# Patient Record
Sex: Female | Born: 1965 | Race: White | Hispanic: No | Smoking: Current every day smoker
Health system: Southern US, Community
[De-identification: ages and names within clinical notes are randomized; demographics above are authoritative.]

## PROBLEM LIST (undated history)

## (undated) ENCOUNTER — Emergency Department (HOSPITAL_COMMUNITY): Admission: EM | Payer: Commercial Managed Care - PPO | Source: Home / Self Care

## (undated) DIAGNOSIS — R112 Nausea with vomiting, unspecified: Secondary | ICD-10-CM

## (undated) DIAGNOSIS — K219 Gastro-esophageal reflux disease without esophagitis: Secondary | ICD-10-CM

## (undated) DIAGNOSIS — J45909 Unspecified asthma, uncomplicated: Secondary | ICD-10-CM

## (undated) DIAGNOSIS — F411 Generalized anxiety disorder: Secondary | ICD-10-CM

## (undated) DIAGNOSIS — Z9889 Other specified postprocedural states: Secondary | ICD-10-CM

## (undated) DIAGNOSIS — J189 Pneumonia, unspecified organism: Secondary | ICD-10-CM

## (undated) DIAGNOSIS — R3129 Other microscopic hematuria: Secondary | ICD-10-CM

## (undated) DIAGNOSIS — F419 Anxiety disorder, unspecified: Secondary | ICD-10-CM

## (undated) DIAGNOSIS — I82409 Acute embolism and thrombosis of unspecified deep veins of unspecified lower extremity: Secondary | ICD-10-CM

## (undated) HISTORY — DX: Unspecified asthma, uncomplicated: J45.909

## (undated) HISTORY — DX: Acute embolism and thrombosis of unspecified deep veins of unspecified lower extremity: I82.409

## (undated) HISTORY — DX: Pneumonia, unspecified organism: J18.9

## (undated) HISTORY — DX: Generalized anxiety disorder: F41.1

## (undated) HISTORY — DX: Other microscopic hematuria: R31.29

---

## 1997-11-09 ENCOUNTER — Other Ambulatory Visit: Admission: RE | Admit: 1997-11-09 | Discharge: 1997-11-09 | Payer: Self-pay | Admitting: Obstetrics and Gynecology

## 1998-11-14 ENCOUNTER — Other Ambulatory Visit: Admission: RE | Admit: 1998-11-14 | Discharge: 1998-11-14 | Payer: Self-pay | Admitting: Obstetrics and Gynecology

## 1999-02-14 ENCOUNTER — Encounter: Payer: Self-pay | Admitting: Obstetrics and Gynecology

## 1999-02-14 ENCOUNTER — Ambulatory Visit (HOSPITAL_COMMUNITY): Admission: RE | Admit: 1999-02-14 | Discharge: 1999-02-14 | Payer: Self-pay | Admitting: Obstetrics and Gynecology

## 1999-06-18 ENCOUNTER — Other Ambulatory Visit: Admission: RE | Admit: 1999-06-18 | Discharge: 1999-06-18 | Payer: Self-pay | Admitting: Obstetrics and Gynecology

## 1999-06-18 ENCOUNTER — Encounter (INDEPENDENT_AMBULATORY_CARE_PROVIDER_SITE_OTHER): Payer: Self-pay

## 1999-08-09 ENCOUNTER — Other Ambulatory Visit: Admission: RE | Admit: 1999-08-09 | Discharge: 1999-08-09 | Payer: Self-pay | Admitting: Obstetrics and Gynecology

## 1999-08-09 ENCOUNTER — Encounter (INDEPENDENT_AMBULATORY_CARE_PROVIDER_SITE_OTHER): Payer: Self-pay

## 1999-10-02 ENCOUNTER — Ambulatory Visit (HOSPITAL_COMMUNITY): Admission: RE | Admit: 1999-10-02 | Discharge: 1999-10-02 | Payer: Self-pay | Admitting: Obstetrics and Gynecology

## 1999-11-28 ENCOUNTER — Other Ambulatory Visit: Admission: RE | Admit: 1999-11-28 | Discharge: 1999-11-28 | Payer: Self-pay | Admitting: Obstetrics and Gynecology

## 2001-01-23 ENCOUNTER — Other Ambulatory Visit: Admission: RE | Admit: 2001-01-23 | Discharge: 2001-01-23 | Payer: Self-pay | Admitting: Obstetrics and Gynecology

## 2002-03-29 ENCOUNTER — Other Ambulatory Visit: Admission: RE | Admit: 2002-03-29 | Discharge: 2002-03-29 | Payer: Self-pay | Admitting: Obstetrics and Gynecology

## 2003-01-21 ENCOUNTER — Ambulatory Visit (HOSPITAL_COMMUNITY): Admission: RE | Admit: 2003-01-21 | Discharge: 2003-01-21 | Payer: Self-pay | Admitting: Family Medicine

## 2003-03-28 ENCOUNTER — Other Ambulatory Visit: Admission: RE | Admit: 2003-03-28 | Discharge: 2003-03-28 | Payer: Self-pay | Admitting: Obstetrics and Gynecology

## 2003-07-14 ENCOUNTER — Ambulatory Visit (HOSPITAL_COMMUNITY): Admission: RE | Admit: 2003-07-14 | Discharge: 2003-07-14 | Payer: Self-pay | Admitting: Family Medicine

## 2003-08-15 ENCOUNTER — Other Ambulatory Visit: Admission: RE | Admit: 2003-08-15 | Discharge: 2003-08-15 | Payer: Self-pay | Admitting: Dermatology

## 2004-02-05 HISTORY — PX: HYSTEROSCOPY: SHX211

## 2004-04-26 ENCOUNTER — Other Ambulatory Visit: Admission: RE | Admit: 2004-04-26 | Discharge: 2004-04-26 | Payer: Self-pay | Admitting: Obstetrics and Gynecology

## 2004-05-21 ENCOUNTER — Encounter (INDEPENDENT_AMBULATORY_CARE_PROVIDER_SITE_OTHER): Payer: Self-pay | Admitting: Specialist

## 2004-05-21 ENCOUNTER — Ambulatory Visit (HOSPITAL_COMMUNITY): Admission: RE | Admit: 2004-05-21 | Discharge: 2004-05-21 | Payer: Self-pay | Admitting: Obstetrics and Gynecology

## 2005-05-15 ENCOUNTER — Other Ambulatory Visit: Admission: RE | Admit: 2005-05-15 | Discharge: 2005-05-15 | Payer: Self-pay | Admitting: Obstetrics and Gynecology

## 2006-05-20 ENCOUNTER — Other Ambulatory Visit: Admission: RE | Admit: 2006-05-20 | Discharge: 2006-05-20 | Payer: Self-pay | Admitting: Obstetrics and Gynecology

## 2006-06-09 ENCOUNTER — Encounter: Admission: RE | Admit: 2006-06-09 | Discharge: 2006-06-09 | Payer: Self-pay | Admitting: Obstetrics and Gynecology

## 2006-06-11 ENCOUNTER — Encounter: Admission: RE | Admit: 2006-06-11 | Discharge: 2006-06-11 | Payer: Self-pay | Admitting: Obstetrics and Gynecology

## 2006-07-16 ENCOUNTER — Ambulatory Visit (HOSPITAL_COMMUNITY): Admission: RE | Admit: 2006-07-16 | Discharge: 2006-07-16 | Payer: Self-pay | Admitting: Otolaryngology

## 2006-07-16 ENCOUNTER — Encounter (INDEPENDENT_AMBULATORY_CARE_PROVIDER_SITE_OTHER): Payer: Self-pay | Admitting: Obstetrics and Gynecology

## 2007-06-02 ENCOUNTER — Other Ambulatory Visit: Admission: RE | Admit: 2007-06-02 | Discharge: 2007-06-02 | Payer: Self-pay | Admitting: Gynecology

## 2007-06-17 ENCOUNTER — Encounter: Admission: RE | Admit: 2007-06-17 | Discharge: 2007-06-17 | Payer: Self-pay | Admitting: Gynecology

## 2007-10-19 ENCOUNTER — Ambulatory Visit (HOSPITAL_BASED_OUTPATIENT_CLINIC_OR_DEPARTMENT_OTHER): Admission: RE | Admit: 2007-10-19 | Discharge: 2007-10-19 | Payer: Self-pay | Admitting: Otolaryngology

## 2008-03-07 ENCOUNTER — Ambulatory Visit: Payer: Self-pay | Admitting: Gynecology

## 2008-04-22 ENCOUNTER — Ambulatory Visit: Payer: Self-pay | Admitting: Gynecology

## 2008-06-21 ENCOUNTER — Encounter: Admission: RE | Admit: 2008-06-21 | Discharge: 2008-06-21 | Payer: Self-pay | Admitting: Gynecology

## 2008-06-23 ENCOUNTER — Encounter: Admission: RE | Admit: 2008-06-23 | Discharge: 2008-06-23 | Payer: Self-pay | Admitting: Gynecology

## 2008-06-28 ENCOUNTER — Ambulatory Visit: Payer: Self-pay | Admitting: Gynecology

## 2008-06-28 ENCOUNTER — Other Ambulatory Visit: Admission: RE | Admit: 2008-06-28 | Discharge: 2008-06-28 | Payer: Self-pay | Admitting: Gynecology

## 2008-06-28 ENCOUNTER — Encounter: Payer: Self-pay | Admitting: Gynecology

## 2009-06-26 ENCOUNTER — Encounter: Admission: RE | Admit: 2009-06-26 | Discharge: 2009-06-26 | Payer: Self-pay | Admitting: Gynecology

## 2009-07-24 ENCOUNTER — Ambulatory Visit: Payer: Self-pay | Admitting: Gynecology

## 2009-07-24 ENCOUNTER — Other Ambulatory Visit: Admission: RE | Admit: 2009-07-24 | Discharge: 2009-07-24 | Payer: Self-pay | Admitting: Gynecology

## 2010-05-25 ENCOUNTER — Other Ambulatory Visit: Payer: Self-pay | Admitting: Gynecology

## 2010-05-25 DIAGNOSIS — Z1231 Encounter for screening mammogram for malignant neoplasm of breast: Secondary | ICD-10-CM

## 2010-06-19 NOTE — H&P (Signed)
NAMECarmon Robertson             ACCOUNT NO.:  [id number]   MEDICAL RECORD NO.:  [id number]          PATIENT TYPE:  AMB   LOCATION:                                [facility]  [facility]   PHYSICIAN:  James A. Ashley Royalty, M.D.DATE OF BIRTH:  1965-07-15   DATE OF ADMISSION:  07/16/2006  DATE OF DISCHARGE:                              HISTORY & PHYSICAL   This is a 45 year old gravida 1, para 1, who presented July 10, 2006,  complaining of abnormal uterine bleeding.  She states she had a period  in early April and did not have another period until May 24.  However,  her bleeding ensued on June 3 and June 4.  She has a history of a  sonohysterogram which was not well-tolerated by her in the office  setting.  Subsequent ultrasound revealed the uterus to be 10 x 6 x 4 cm  with numerous small fibroids, the largest of which was 1 cm in greatest  diameter.  There was also a 2.3 cm right adnexal cyst.  The left adnexa  was unremarkable.  The patient presents for diagnostic/operative  hysteroscopy and dilatation and curettage.   MEDICATIONS:  None.   PAST MEDICAL HISTORY:  Medical:  Negative.   Surgical:  Diagnostic/operative laparoscopy August 2001.   ALLERGIES:  None.   FAMILY HISTORY:  Noncontributory.   SOCIAL HISTORY:  The patient denies the use of tobacco or significant  alcohol.   REVIEW OF SYSTEMS:  Noncontributory.   PHYSICAL EXAMINATION:  GENERAL:  A well-developed, well-nourished,  pleasant white female in no acute distress.  VITAL SIGNS:  Afebrile with vital signs stable.  CHEST:  Lungs are clear.  HEART:  Regular rate and rhythm.  BREASTS:  Exam deferred.  ABDOMEN:  Soft and nontender.  PELVIC:  Please see most recent office evaluation.  External genitalia  within normal limits.  Vagina and cervix are without gross lesions.  Bimanual examination reveals the uterus to be approximately __________  x 6 x 5 cm.  No adnexal masses are palpable.   IMPRESSION:  1. Fibroid uterus.  2. Right adnexal cyst, probably physiologic.  3. Abnormal uterine bleeding, possibly secondary to #1.  4. Refusal for office sonohysterogram.   PLAN:  1. Diagnostic/operative hysteroscopy.  2. Dilatation and curettage.  The risks, benefits, complications and      alternatives were fully discussed with the patient.  She states she      understands and accepts.  Questions were invited and answered.      James A. Ashley Royalty, M.D.  Electronically Signed     JAM/MEDQ  D:  07/15/2006  T:  07/15/2006  Job:  [id number]

## 2010-06-19 NOTE — Op Note (Signed)
NAME:  Ruth Robertson, Ruth Robertson      ACCOUNT NO.:  [id number]   MEDICAL RECORD NO.:  [id number]          PATIENT TYPE:  AMB   LOCATION:  SDC                           FACILITY:  [facility]   PHYSICIAN:  James A. Ashley Royalty, M.D.DATE OF BIRTH:  Aug 31, 1965   DATE OF PROCEDURE:  07/16/2006  DATE OF DISCHARGE:                               OPERATIVE REPORT   PREOPERATIVE DIAGNOSIS:  1. Fibroid uterus.  2. Abnormal uterine bleeding.  3. Refusal of office sonohysterogram.   POSTOPERATIVE DIAGNOSIS:  1. Fibroid uterus.  2. Abnormal uterine bleeding.  3. Refusal of office sonohysterogram.  4. Pathology pending.   PROCEDURE:  1. Diagnostic/operative hysteroscopy.  2. Dilatation and curettage.   SURGEON:  Sylvester Harder, M.D.   ANESTHESIA:  General with 1% Xylocaine paracervical block (20 mL).   ESTIMATED BLOOD LOSS:  50 mL.   COMPLICATIONS:  None.   PACKS AND DRAINS:  None.   PROCEDURE:  The patient is taken to the operating room, placed in the  dorsal supine position.  After general anesthetic was administered she  was placed in the lithotomy position, prepped and draped in usual manner  for vaginal surgery.  Posterior weighted retractor was placed per  vagina.  The anterior lip of the cervix was grasped with single-tooth  tenaculum.  Uterus was gently sounded to approximately 10 cm and noted  to be anteverted.  The cervix was then serially dilated to a size 25-  [place] using [employer] features editor.  The resectoscope was then placed into the  uterine cavity using sorbitol as a distension medium.  The left and  right tubal ostia were identified.  The endometrial cavity was  inspected.  There were several projections into the cavity which were  either fibroids or polyps.  Appropriate photos were obtained.  The  endocervix was benign.  The polyp versus fibroids were removed from  their attachments using the cutting waveform at 70 watts power.  The  fragments were sent to pathology for histologic  studies.  Hemostasis was  obtained with the coagulation waveform at approximately 40 watts power.  The resectoscope was then removed.   Attention was then turned to the uterine curettage.  Medium-size curette  was introduced into the uterine cavity.  First a four quadrant technique  was employed.  Then a therapeutic technique was employed.  All  curettings were submitted separately to pathology for histologic  studies.   Hemostasis was noted at this point.  The patient was felt to have  benefited maximally from the surgical procedure.  The vaginal  instruments were removed.  Hemostasis noted and the procedure  terminated.   The patient tolerated the procedure extremely well and was returned to  the recovery room in good condition.      James A. Ashley Royalty, M.D.  Electronically Signed     JAM/MEDQ  D:  07/16/2006  T:  07/16/2006  Job:  [id number]

## 2010-06-19 NOTE — Op Note (Signed)
NAME:  Ruth Robertson, METTLER      ACCOUNT NO.:  [id number]   MEDICAL RECORD NO.:  [id number]          PATIENT TYPE:  AMB   LOCATION:  DSC                          FACILITY:  [facility]   PHYSICIAN:  Jefry H. 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   PROCEDURES:  1. Nasal septoplasty.  2. Submucous resection of right inferior turbinate.   SURGEON:  Jefry H. Pollyann Kennedy, MD   ANESTHESIA:  General endotracheal anesthesia was used.   COMPLICATIONS:  None.   BLOOD LOSS:  Minimal.   FINDINGS:  Severe leftward septal deviation involving the 90-degree band  in the caudal cartilage and a severe leftward deflection of the entire  bony septum.  The septum was lying outside of the maxillary crest for  the majority of the inferior attachment.  The left inferior turbinate  was very small, the right side was large with thickened bone and  elongated bone.   REFERRING PHYSICIAN:  Scott A. Luking, MD   HISTORY:  A 45 year old with a long history of nasal obstruction,  getting worse over the past several years.  Risks, benefits,  alternatives, and complications of the procedure were explained to the  patient who seemed to understand and agreed for surgery.   PROCEDURE:  The patient was taken to the operating room, placed in the  operating table in supine position.  Following induction of general  endotracheal anesthesia, the table was positioned and the patient was  prepped and draped in the standard fashion.  Afrin spray was used  preoperatively in nasal cavities.  Xylocaine with epinephrine was  infiltrated into the septum, the columella of the inferior turbinate,  and the floor of the nose on the left.  Afrin-soaked pledgets were then  placed bilaterally.  1. Nasal septoplasty.  A left hemitransfixion incision was created      with a 15 scalpel with extension down to the anterior floor of the      nasal vestibule on the left side.  A Cottle elevator was used to      elevate the mucoperichondrial flap posteriorly down the left side      and connecting into a floor tunnel to expose the entire leftward      deviation.  The bony septum was severely deflected to the left and      fragments were resected.  A mucosal flap was then developed down      the right side to remove the posterior bony septum.  The majority      of ethmoid plate and the vomerine bone were resected.  A chisel was      used to remove part of the vomerine bone, which was leaning to the      left side as well.  The quadrangular cartilage had a 90-degree bend      at the caudal extent that was bent to the right side.  This      deflected area was completely resected leaving the remaining      quadrangular cartilage, which still had sufficient dorsal support      and caudal strut support.  The remainder of the ethmoid plate was      resected superiorly.  The posterior aspect of the quadrangular      cartilage was resected.  The cartilaginous attachment to the      maxillary crest was dissected off using the Cottle elevator and by      freeing up, the remaining cartilage was able to fit it nicely into      the centrally-positioned maxillary crest.  These maneuvers greatly      improved the positioning of the septum and the airway particularly      on the left side.  The mucosal incision was reapproximated with      chromic suture.  The septal flaps were quilted with plain gut.  2. Submucous resection in right inferior turbinate.  The leading edge      of the right inferior turbinate was incised in a vertical fashion      with a 15 scalpel.  Cottle elevator was used to elevate mucosa off      bone in all directions and a large bony fragment was resected.  The       turbinate remnant was outfractured.  The nasal cavities and      nasopharynx were suctioned blood secretions and the nasal cavities      were packed with rolled up Telfa coated with bacitracin ointment.      The patient was then awakened, extubated, and transferred to      recovery in stable condition.      Jefry H. Pollyann Kennedy, MD  Electronically Signed     JHR/MEDQ  D:  10/19/2007  T:  10/20/2007  Job:  [id number]   cc:   Lorin Picket A. Gerda Diss, MD

## 2010-06-22 NOTE — Op Note (Signed)
NAME:  Ruth Robertson, Ruth Robertson      ACCOUNT NO.:  [id number]   MEDICAL RECORD NO.:  [id number]          PATIENT TYPE:  AMB   LOCATION:  SDC                           [facility]ILITY[facility]   PHYSICIAN:  James A. Ashley Royalty, M.D.DATE OF BIRTH:  1965/08/24   DATE OF PROCEDURE:  05/21/2004  DATE OF DISCHARGE:                                 OPERATIVE REPORT   PREOPERATIVE DIAGNOSIS:  Intermenstrual bleeding, polyp versus fibroid.   POSTOPERATIVE DIAGNOSIS:  Apparent intrauterine polyp, pathology pending.   PROCEDURES:  1.  Diagnostic/operative hysteroscopy.  2.  Dilatation and curettage.   SURGEON:  Rudy Jew. Ashley Royalty, M.D.   ANESTHESIA:  General.   ESTIMATED BLOOD LOSS:  Less than 50 mL.   COMPLICATIONS:  None.   PACKS AND DRAINS:  None.   PROCEDURE:  The patient was taken to the operating room and placed in the  dorsal supine position.  After a general anesthetic was administered, she  was placed in the lithotomy position and prepped and draped in the usual  manner for vaginal surgery.  A posterior weighted retractor was placed per  vagina.  The anterior lip of the cervix was grasped with a single-tooth  tenaculum.  The uterus was gently sounded to approximately 9 cm.  It was  noted to be quite anteverted.  Next the cervix was dilated to a size 27  [place] using [employer] dilators.  The resectoscope was then placed into the  uterine cavity using sorbitol as a distention medium.  Immediately upon  visualizing the uterine cavity, a polyp was encountered and appropriate  photos obtained.  The left and right tubal ostia were seen without  difficulty.  After appropriate documentation was obtained photographically,  the polyp was removed.  Electricity was not required to do so.  It was  submitted separately to pathology for histologic studies.  Thereafter the  remainder of the uterine cavity appeared clean.  The endocervical canal also  appeared clean.  Appropriate photos were obtained.   The  attention was then turned to the uterine curettage.  A medium-sized  curette was introduced into the uterine cavity.  First a four-quadrant  technique was used.  Then a therapeutic technique was used.  All curettings  were submitted separately to pathology for histologic studies.  At this  point the vaginal instruments were removed.  There  were two small cervical lacerations at the tenaculum site, each of which was  easily closed with 3-0 chromic interrupted suture.  Hemostasis was noted and  the procedure terminated.   The patient tolerated the procedure extremely well and was returned to the  recovery room in good condition.      JAM/MEDQ  D:  05/21/2004  T:  05/21/2004  Job:  [id number]

## 2010-06-22 NOTE — H&P (Signed)
NAME:  Ruth Robertson, Ruth Robertson      ACCOUNT NO.:  [id number]   MEDICAL RECORD NO.:  [id number]          PATIENT TYPE:  AMB   LOCATION:  SDC                           [facility]   PHYSICIAN:  James A. Ashley Royalty, M.D.DATE OF BIRTH:  01-Sep-1965   DATE OF ADMISSION:  DATE OF DISCHARGE:                                HISTORY & PHYSICAL   HISTORY OF PRESENT ILLNESS:  This is a 45 year old gravida 1 para 1 who  presented April 26, 2004 complaining of intermenstrual bleeding. She was  subsequently asked to undergo sonohysterogram but unfortunately decided she  would prefer to proceed with D&C hysteroscopy instead.   MEDICATIONS:  None.   PAST MEDICAL HISTORY:  Medical:  Negative. Surgical:  Diagnostic/operative  laparoscopy August 2001.   ALLERGIES:  None.   FAMILY HISTORY:  Positive for hypertension, breast cancer.   SOCIAL HISTORY:  The patient denies the use of significant alcohol. She is a  smoker.   REVIEW OF SYSTEMS:  Noncontributory.   PHYSICAL EXAMINATION:  GENERAL:  Well-developed, well-nourished, pleasant  white female in no acute distress.  VITAL SIGNS:  Afebrile, vital signs stable. Please see exam dated April 26, 2004.  SKIN:  Warm and dry without lesions.  LYMPH:  There is no supraclavicular, cervical, or inguinal adenopathy.  HEENT:  Normocephalic.  NECK:  Supple without thyromegaly.  CHEST:  Lungs are clear.  CARDIAC:  Regular rate and rhythm without murmurs, gallops, or rubs.  BREAST:  Deferred today.  ABDOMEN:  Soft, nontender, without masses or organomegaly.  MUSCULOSKELETAL:  Reveals full range of motion.  PELVIC:  External genitalia within normal limits. Vagina and cervix were  without gross lesions. Bimanual examination reveals the uterus to be  approximately 10 x 6 x 5 cm and slightly nodular. There are no adnexal  masses palpable.   IMPRESSION:  1.  Intermenstrual bleeding.  2.  Enlarged uterus - possible fibroid.  3.  Smoker.  4.  Infertility.  5.  Husband with Berger's disease.   PLAN:  1.  Diagnostic/operative hysteroscopy.  2.  Dilatation and curettage.   Risks, benefits, complications, and alternatives fully discussed with the  patient. The possible use of the resectoscope discussed and accepted.  Questions invited and answered.      JAM/MEDQ  D:  05/21/2004  T:  05/21/2004  Job:  [id number]

## 2010-06-22 NOTE — H&P (Signed)
[facility]  Patient:    Ruth Robertson                       MRN: [id number] Adm. Date:  10/02/99 Attending:  Fayrene Fearing A. Ashley Royalty, M.D.                         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  MEDICATIONS:  None.  PAST MEDICAL HISTORY:  Medical negative.  SURGICAL:   Negative.  ALLERGIES:  None reported.  FAMILY HISTORY:  Positive for breast cancer and hypertension.  SOCIAL HISTORY:  The patient denies use of significant alcohol.  PHYSICAL EXAMINATION:  GENERAL:  Well-developed, well-nourished pleasant white female in no acute distress.  VITAL SIGNS:  Stable.  SKIN:  Warm and dry without lesions.  LYMPH:  There is no supraclavicular, cervical or inguinal adenopathy.  HEENT:  Normocephalic.  NECK:  Supple without thyromegaly.  CHEST:  Lungs are clear.  CARDIAC:  Regular rate and rhythm without murmur, gallop, or rub.  BREAST EXAM:  Deferred.  ABDOMEN: soft, nontender without masses or organomegaly.  Bowel sounds are active.  MUSCULOSKELETAL:  Reveals full range of motion without edema, cyanosis, or CVA tenderness.  PELVIC:  Deferred until examination under anesthesia.  IMPRESSION:  Secondary infertility.  PLAN:  Diagnostic/operative laparoscopy.  The risks, benefits, complications, and alternatives were fully discussed with the patient.  Possible need for unilateral salpingo-oophorectomy discussed and accepted.  Possible need for exploratory laparotomy discussed and accepted.  Questions invited and answered. DD:  10/02/99 TD:  10/02/99 Job: [id number]

## 2010-06-22 NOTE — Op Note (Signed)
[facility]  Patient:    Ruth Robertson                    MRN: [id number] Proc. Date: 10/02/99 Adm. Date:  [id number] Attending:  Wandalee Ferdinand                           Operative Report  PREOPERATIVE DIAGNOSIS:       Secondary infertility.  POSTOPERATIVE DIAGNOSES:      1. Secondary infertility.                               2. Peritubular adhesions.  OPERATION:                    1. Diagnostic operative laparoscopy.                               2. Lysis of adhesions.                               3. Chromopertubation of the oviducts.  SURGEON:                      Rudy Jew. Ashley Royalty, M.D.  ASSISTANT:  ANESTHESIA:                   General endotracheal anesthesia.  ESTIMATED BLOOD LOSS:         Less than 25 cc.  COMPLICATIONS:                None.  PACKS AND DRAINS:             None.  DESCRIPTION OF PROCEDURE:     The patient was taken to the operating room and placed in the dorsal lithotomy position.  After general endotracheal anesthesia was administered, she was placed in the lithotomy position and prepped and draped in the usual manner for abdominal and vaginal surgery. Posterior weighted retractor was placed per vagina and the anterior lip of the cervix was grasped with a single-tooth tenaculum.  Jarcho uterine manipulator was placed per cervix and held in place with a tenaculum.  The bladder was drained with a red rubber catheter.  Next, a 1.5 cm infraumbilical incision was made in the longitudinal plane.  The size 10-11 disposable laparoscopic trocar was placed in the abdominal cavity.  Its location was verified by placement of the laparoscope.  There was no evidence of any trauma. Pneumoperitoneum was created with CO2 and maintained throughout the procedure. Next, two 5 mm suprapubic trocars were placed using transillumination and direct visualization techniques.  One was placed in the midline and the other just to the  left of midline.  The pelvis was thoroughly surveyed.  The uterus was normal size, shape and contour without evidence of any endometriosis or fibroids.  The right ovary was normal size, shape and contour without evidence of any cysts or endometriosis.  The left ovary was normal size, shape and contour without evidence any endometriosis or cysts.  Bilaterally there were peritubular adhesions of the ovaries and tubes, however.  The fallopian tubes were normal size, shape and contour and length bilaterally with luxuriant fimbriae.  The remainder of the peritoneal surfaces were smooth and glistening. There  was no evidence of any endometriosis or other pathology noted.  The [place] suction irrigator was used to remove the fluid in the cul-de-sac and the bipolar cautery was used to cauterize the adhesions prior to being cut with the scissors.  All adhesions were successfully coagulated and cut without incident.  Appropriate photos were obtained before and after.  Finally dilute indigo carmine was instilled through the [facility] uterine manipulator and there was bilateral spill through the fallopian tubes noted.  At this point, the patient was felt to have benefited maximally from the procedure.  The abdominal instruments were removed and the pneumoperitoneum evacuated.  Fascial defects were closed with 0 Vicryl in interrupted fashion. The skin was closed with 3-0 chromic in subcuticular fashion.  The patient tolerated the procedure extremely well and was returned to the recovery room in good condition. DD:  10/02/99 TD:  10/03/99 Job: [id number]

## 2010-07-09 ENCOUNTER — Ambulatory Visit
Admission: RE | Admit: 2010-07-09 | Discharge: 2010-07-09 | Disposition: A | Discharge status: Home / Self Care | Payer: BC Managed Care – PPO | Source: Ambulatory Visit | Attending: Gynecology | Admitting: Gynecology

## 2010-07-09 DIAGNOSIS — Z1231 Encounter for screening mammogram for malignant neoplasm of breast: Secondary | ICD-10-CM

## 2010-07-12 ENCOUNTER — Ambulatory Visit (HOSPITAL_COMMUNITY)
Admission: RE | Admit: 2010-07-12 | Discharge: 2010-07-12 | Disposition: A | Discharge status: Home / Self Care | Payer: BC Managed Care – PPO | Source: Ambulatory Visit | Attending: Family Medicine | Admitting: Family Medicine

## 2010-07-12 ENCOUNTER — Other Ambulatory Visit: Payer: Self-pay | Admitting: Family Medicine

## 2010-07-12 DIAGNOSIS — M25512 Pain in left shoulder: Secondary | ICD-10-CM

## 2010-07-12 DIAGNOSIS — M25519 Pain in unspecified shoulder: Secondary | ICD-10-CM | POA: Insufficient documentation

## 2010-07-26 ENCOUNTER — Other Ambulatory Visit: Payer: Self-pay | Admitting: Gynecology

## 2010-07-26 ENCOUNTER — Encounter (INDEPENDENT_AMBULATORY_CARE_PROVIDER_SITE_OTHER): Payer: BC Managed Care – PPO | Admitting: Gynecology

## 2010-07-26 ENCOUNTER — Other Ambulatory Visit (HOSPITAL_COMMUNITY)
Admission: RE | Admit: 2010-07-26 | Discharge: 2010-07-26 | Disposition: A | Discharge status: Home / Self Care | Payer: BC Managed Care – PPO | Source: Ambulatory Visit | Attending: Gynecology | Admitting: Gynecology

## 2010-07-26 DIAGNOSIS — Z01419 Encounter for gynecological examination (general) (routine) without abnormal findings: Secondary | ICD-10-CM

## 2010-07-26 DIAGNOSIS — Z833 Family history of diabetes mellitus: Secondary | ICD-10-CM

## 2010-07-26 DIAGNOSIS — Z124 Encounter for screening for malignant neoplasm of cervix: Secondary | ICD-10-CM | POA: Insufficient documentation

## 2010-07-26 DIAGNOSIS — Z13220 Encounter for screening for lipoid disorders: Secondary | ICD-10-CM

## 2010-07-26 DIAGNOSIS — R82998 Other abnormal findings in urine: Secondary | ICD-10-CM

## 2010-10-26 ENCOUNTER — Telehealth: Payer: Self-pay | Admitting: *Deleted

## 2010-10-26 NOTE — Telephone Encounter (Signed)
If patient's periods have been normal in the past and this is first episode of irregular bleeding that I would monitor wait through her next period and if resumes regular dental follow. If she has recurrent or persistent abnormal bleeding or this has gone on the past I would make an office appointment.

## 2010-10-26 NOTE — Telephone Encounter (Signed)
PT INFORMED WITH THE BELOW NOTE. 

## 2010-10-26 NOTE — Telephone Encounter (Signed)
Pt called c/o some spotting between period. LMP:10/03/10 she also noticed some brownish discharge this past Wednesday. No odor or pain no other complains. Her next period should come next week sometime.  Pt thinks maybe due to her age it could be early menopause coming on. Should pt make office visit next week sometime? Please advise

## 2010-10-31 ENCOUNTER — Encounter: Payer: Self-pay | Admitting: Women's Health

## 2010-10-31 ENCOUNTER — Ambulatory Visit (INDEPENDENT_AMBULATORY_CARE_PROVIDER_SITE_OTHER): Payer: BC Managed Care – PPO | Admitting: Women's Health

## 2010-10-31 VITALS — BP 124/70

## 2010-10-31 DIAGNOSIS — B373 Candidiasis of vulva and vagina: Secondary | ICD-10-CM

## 2010-10-31 DIAGNOSIS — N949 Unspecified condition associated with female genital organs and menstrual cycle: Secondary | ICD-10-CM

## 2010-10-31 DIAGNOSIS — R350 Frequency of micturition: Secondary | ICD-10-CM

## 2010-10-31 DIAGNOSIS — N938 Other specified abnormal uterine and vaginal bleeding: Secondary | ICD-10-CM

## 2010-10-31 DIAGNOSIS — Z113 Encounter for screening for infections with a predominantly sexual mode of transmission: Secondary | ICD-10-CM

## 2010-10-31 DIAGNOSIS — R823 Hemoglobinuria: Secondary | ICD-10-CM

## 2010-10-31 LAB — POCT URINE PREGNANCY: Preg Test, Ur: NEGATIVE

## 2010-10-31 MED ORDER — FLUCONAZOLE 150 MG PO TABS: 150.0000 mg | ORAL_TABLET | Freq: Once | ORAL | Status: AC

## 2010-10-31 NOTE — Progress Notes (Signed)
  Presents with a complaint of bright red bleeding that started on 9/19, cycle then started on 9/22, for several days and has been bleeding red/brown ever since off and on. She states this is the first time this has happened in years, she does have a regular monthly 3-5d cycle. She does not use contraception but does have a history of infertility. U PT today is negative, denies any urinary symptoms, UA does show 0-2 WBCs will check a urine culture. History of a negative endometrial polyp in 2006.  Exam: Abdomen soft nontender, external genitalia is within normal limits, speculum exam scant red blood was noted wet prep is positive for yeast, GC Chlamydia culture was taken and is pending, bimanual no CMT no adnexal fullness or tenderness.   Plan: Sonohysterogram with biopsy with Dr. Audie Box, will schedule. Did review we could watch at this time to see if bleeding stops or continues next month. Prefers not to wait. Will treat yeast with Diflucan 150 by mouth x1 dose with a refill.

## 2010-11-01 ENCOUNTER — Telehealth: Payer: Self-pay | Admitting: *Deleted

## 2010-11-01 ENCOUNTER — Ambulatory Visit: Payer: BC Managed Care – PPO | Admitting: Gynecology

## 2010-11-01 NOTE — Telephone Encounter (Signed)
Telephone call to patient, did review we do not schedule D&Cs without first confirming the problem. She states she has had a sonohysterogram in the past but it did cause some discomfort and cramping. Did encourage her to take Motrin prior to her office visit. She states she does have Xanax and she can take 1 dose also. Reviewed to have someone drive her. Will keep scheduled appointment with Dr. Audie Box on October 11.

## 2010-11-01 NOTE — Telephone Encounter (Signed)
(  see office visit note 10/31/10) Pt called stating she remembers having a sonohysterogram back in 2006. She states it was extremely painful and she would prefer not to have this. Pt wants to know if she could set up for an D&C? Please advise

## 2010-11-07 LAB — POCT HEMOGLOBIN-HEMACUE: Hemoglobin: 13.2

## 2010-11-15 ENCOUNTER — Other Ambulatory Visit: Payer: Self-pay | Admitting: Gynecology

## 2010-11-15 ENCOUNTER — Ambulatory Visit (INDEPENDENT_AMBULATORY_CARE_PROVIDER_SITE_OTHER): Payer: BC Managed Care – PPO | Admitting: Gynecology

## 2010-11-15 ENCOUNTER — Ambulatory Visit (INDEPENDENT_AMBULATORY_CARE_PROVIDER_SITE_OTHER): Payer: BC Managed Care – PPO

## 2010-11-15 ENCOUNTER — Encounter: Payer: Self-pay | Admitting: Gynecology

## 2010-11-15 DIAGNOSIS — D251 Intramural leiomyoma of uterus: Secondary | ICD-10-CM

## 2010-11-15 DIAGNOSIS — N938 Other specified abnormal uterine and vaginal bleeding: Secondary | ICD-10-CM

## 2010-11-15 DIAGNOSIS — N949 Unspecified condition associated with female genital organs and menstrual cycle: Secondary | ICD-10-CM

## 2010-11-15 DIAGNOSIS — D259 Leiomyoma of uterus, unspecified: Secondary | ICD-10-CM

## 2010-11-15 DIAGNOSIS — N831 Corpus luteum cyst of ovary, unspecified side: Secondary | ICD-10-CM

## 2010-11-15 NOTE — Patient Instructions (Signed)
Follow up for biopsy report and ultrasound in 2-3 months

## 2010-11-15 NOTE — Progress Notes (Signed)
Patient presents having seen [facility] complaining of some irregular bleeding in September. She's had regular menses that started in September had a repeat bleed again in September and then no bleeding since then. She was scheduled for sonohysterogram due to this history.  Ultrasound shows several myomas largest measuring 33 mm left ovary with a classic corpus luteal cyst. Sonohysterogram was performed sterile technique EZ-Cap introduction good distention with a thickening of the anterior endometrial stripe. No classic polyp with a thickening of the endometrium. It looks on transverse view is the the catheter went through this area. Endometrial sample was taken.  History of dub. No bleeding over the past 3-4 weeks.  Does have corpus luteum on left ovary I anticipate she will start her menses sometime in the next week or 2. Endometrial sample was taken. There was some thickening of the endometrium on the anterior cavity no true polyp. Recommended that we follow up on biopsy results assuming negative and will keep menstrual calendar over the next several months as long as regular menses we'll repeat ultrasound for endometrial echo just to make sure that it's thin and and will follow expectantly. If thickness persist may consider hysteroscopy and or if irregular bleeding persists then we'll pursue a more fault evaluation. Patient's comfortable with the plan.

## 2010-11-22 LAB — CBC
HCT: 39.4
Hemoglobin: 13.5
MCHC: 34.1
MCV: 90.2
Platelets: 240
RBC: 4.37
RDW: 13.4
WBC: 7

## 2010-11-22 LAB — TYPE AND SCREEN
ABO/RH(D): A POS
Antibody Screen: NEGATIVE

## 2010-11-22 LAB — ABO/RH: ABO/RH(D): A POS

## 2010-11-22 LAB — HCG, SERUM, QUALITATIVE: Preg, Serum: NEGATIVE

## 2011-01-08 ENCOUNTER — Telehealth: Payer: Self-pay | Admitting: *Deleted

## 2011-01-08 NOTE — Telephone Encounter (Signed)
Pt called complaining of bleeding. Pt states she had spotting in nov. And on nov. 11 SHGM was done & results were negative. Pt called today stating a normal period flow, per TF office note. Pt was instructed to keep menstrual calender of period and repeat ultrasound in 2-3 months. Pt informed with this and will do as directed.

## 2011-02-15 ENCOUNTER — Encounter: Payer: Self-pay | Admitting: Gynecology

## 2011-02-15 ENCOUNTER — Ambulatory Visit (INDEPENDENT_AMBULATORY_CARE_PROVIDER_SITE_OTHER): Payer: 59 | Admitting: Gynecology

## 2011-02-15 DIAGNOSIS — R350 Frequency of micturition: Secondary | ICD-10-CM

## 2011-02-15 DIAGNOSIS — N926 Irregular menstruation, unspecified: Secondary | ICD-10-CM

## 2011-02-15 LAB — URINALYSIS W MICROSCOPIC + REFLEX CULTURE
Bilirubin Urine: NEGATIVE
Casts: NONE SEEN
Crystals: NONE SEEN
Glucose, UA: NEGATIVE mg/dL
Ketones, ur: NEGATIVE mg/dL
Leukocytes, UA: NEGATIVE
Nitrite: NEGATIVE
Protein, ur: NEGATIVE mg/dL
Specific Gravity, Urine: 1.025 (ref 1.005–1.030)
Urobilinogen, UA: 0.2 mg/dL (ref 0.0–1.0)
WBC, UA: NONE SEEN WBC/hpf (ref <3)
pH: 5 (ref 5.0–8.0)

## 2011-02-15 MED ORDER — NITROFURANTOIN MONOHYD MACRO 100 MG PO CAPS: 100.0000 mg | ORAL_CAPSULE | Freq: Two times a day (BID) | ORAL | Status: AC

## 2011-02-15 NOTE — Progress Notes (Signed)
Patient presents with history of irregular menses in September she ultimately underwent a sonohysterogram in October which was normal with the exception of a thickened endometrium she had a biopsy that showed secretory endometrium. She began bleeding November 12 through December 8 light bleeding to spotting daily stopped altogether until January 3 and then bled for 6 days and no bleeding since. Also notes urinary frequency with small volumes "all her life". No fever chills urgency dysuria. She does drink caffeine although not excessive by history and does drink some carbonated beverages but again not excessive by history.  Exam was Sherri chaperone present Spine straight no CVA tenderness Abdomen soft nontender without masses guarding rebound organomegaly Pelvic external BUS vagina normal. Cervix normal. Uterus normal size anteverted midline mobile nontender. Adnexa without masses or tenderness  Assessment and plan: 1. Irregular menses. Recent sonohysterogram was negative with a thickened anterior endometrium but otherwise normal and biopsy showing secretory endometrium. She's not bleeding now. I will check baseline hormone studies to include hCG TSH prolactin FSH. She does note her mother went through premature menopause at age 37. Assuming that the labs are negative, recommend keeping menstrual calendar for now we'll see what her pattern is. If irregularity continues we'll discuss various options such as intermittent progesterone withdrawal. If any of the labs are abnormal contrast based on results. 2. Urinary frequency. UA is low-level abnormal. I would cover with Macrobid 100 twice a day x7 days. If her symptoms persist she knows to call me and we'll refer to urology to rule out interstitial cystitis.

## 2011-02-15 NOTE — Patient Instructions (Signed)
Follow up for hormone studies. If urinary frequency continues call we'll refer to urology.

## 2011-02-16 LAB — TSH: TSH: 1.42 u[IU]/mL (ref 0.350–4.500)

## 2011-02-16 LAB — HCG, SERUM, QUALITATIVE: Preg, Serum: NEGATIVE

## 2011-02-16 LAB — FOLLICLE STIMULATING HORMONE: FSH: 5 m[IU]/mL

## 2011-02-16 LAB — PROLACTIN: Prolactin: 8.5 ng/mL

## 2011-02-27 ENCOUNTER — Telehealth: Payer: Self-pay | Admitting: *Deleted

## 2011-02-27 NOTE — Telephone Encounter (Signed)
Recommend office visit for evaluation and discuss options.

## 2011-02-27 NOTE — Telephone Encounter (Signed)
Pt was seen 02/15/11 for vaginal bleeding, pt calling today because bleeding has started up again. Bleeding since 02/18/11, pt said she is"sick of this" she then began to start talking about her office visit note that she got in the mail. Pt was going on and on about being tired of all her issues. Please advise

## 2011-02-27 NOTE — Telephone Encounter (Signed)
Left the below note on pt vm 

## 2011-03-06 ENCOUNTER — Ambulatory Visit (INDEPENDENT_AMBULATORY_CARE_PROVIDER_SITE_OTHER): Payer: 59 | Admitting: Gynecology

## 2011-03-06 ENCOUNTER — Encounter: Payer: Self-pay | Admitting: Gynecology

## 2011-03-06 DIAGNOSIS — N926 Irregular menstruation, unspecified: Secondary | ICD-10-CM

## 2011-03-06 DIAGNOSIS — D259 Leiomyoma of uterus, unspecified: Secondary | ICD-10-CM

## 2011-03-06 NOTE — Progress Notes (Signed)
Patient presents with continued bleeding off and on. She historically had regular periods through the fall when she started having prolonged two-week a row bleeding. She had a sonohysterogram which showed multiple small myomas with an endometrial biopsy showing secratory endometrium. Her irregular bleeding has continued on and off with her last menses lasting 2 weeks.  Her prolactin, TSH and FSH were normal.  Exam with Selena Batten chaperone present Abdomen soft nontender without masses guarding rebound organomegaly. Pelvic external BUS vagina normal. Cervix normal. Uterus midline mildly irregular generous in size. Adnexa without masses or tenderness  Assessment and plan: Persistent irregular bleeding, multiple small myomas, normal hormone studies, negative endometrial biopsy. She does smoke age 46. Options for management to include hormonal manipulation such as intermittent progesterone, Mirena IUD, endometrial ablation, hysterectomy. She is not using consistent contraception and I do not think ablation would be a good choice without contraception. She does smoke and I think estrogen-containing manipulation is contraindicated. After lengthy discussion the patient wants to proceed with hysterectomy which I think is appropriate given her situation. I think a laparoscopic assisted hysterectomy would be most appropriate given some bulkiness to the uterus. She did have one vaginal delivery. I discussed Depo-Lupron although with multiple small myomas I am not sure that this would make a big difference from a uterine size standpoint. I did discuss the ovarian conservation issue and she feels very strongly that she wants her ovaries removed. I reviewed the whole issue of ovarian conservation and the benefits as far as calcium bone balance, cardiovascular risk and symptoms. If she keeps her ovaries the issue of ovarian disease up to and including cancer in the future was discussed opposed to removing them and then having the  accelerated issues as noted above. ERT was reviewed to include the WHI study with increased risk of stroke heart attack DVT and possible increased risk of breast cancer and the patient will think about these issues but at this point tentatively wants her ovaries removed. We will move toward scheduling her for surgery and she will follow up for full preop consult.

## 2011-03-06 NOTE — Patient Instructions (Signed)
We will call you to schedule surgery and you will follow up for a preoperative consult

## 2011-03-07 ENCOUNTER — Telehealth: Payer: Self-pay

## 2011-03-07 ENCOUNTER — Telehealth: Payer: Self-pay | Admitting: *Deleted

## 2011-03-07 NOTE — Telephone Encounter (Signed)
I called patient back because Dr. Velvet Bathe sent order to [facility] for Lupron and he thought patient would have surgery 8-12 weeks after injection. I felt from patient that it might be awhile until she could do surgery. She did say that she will only have been at her job 6 mos come May and the summer months they do something with budget and would not want her to be off. She said soonest would be Sept. That she could consider at this time.  I sent Dr. Velvet Bathe a message and let him know and asked regarding anything else she might do in the meantime until Sept. His answer is below:  She smokes so birth-control pills would not be a good choice. She could try continuous progesterone for suppression trial but she is not using anything for contraception and would have to use a short contraception with this. Alternative would be Depo-Lupron for 9 months which may turn out to be the best choice for her.  PER DR TF  Patient wants to move forward with Lupron and see if it is affordable. If not affordable she may opt for continuous progesterone.

## 2011-03-07 NOTE — Telephone Encounter (Signed)
At. Dr. Kristie Cowman request I contacted patient. She did state she needed to wait on hysterectomy as she has not been at her job very long.  I told her Dr. Velvet Bathe asked me to check with her to see if she would like to go ahead with Depot-Lupron as discussed at visit. Would possibly shrink the fibroids and help her stop bleeding.  She would like to proceed with Lupron.

## 2011-03-07 NOTE — Telephone Encounter (Signed)
FYI Pt was seen yesterday for vaginal bleeding, she decided to proceed with hysterectomy. Pt called today and said she would like to wait due to her job that she been there less than 3 months. I have spoke with Olegario Messier about this as well.

## 2011-03-11 MED ORDER — LEUPROLIDE ACETATE (3 MONTH) 11.25 MG IM KIT: 11.2500 mg | PACK | INTRAMUSCULAR | Status: DC

## 2011-03-11 NOTE — Telephone Encounter (Signed)
Patient informed that the Lupron Inj is covered by [facility] for a $10 copay.  Willl send in the rx to [facility] pharmacy and she will call when she picks it up and will schedule time to receive the injection.

## 2011-03-12 ENCOUNTER — Telehealth: Payer: Self-pay | Admitting: *Deleted

## 2011-03-12 NOTE — Telephone Encounter (Signed)
Patient had additional questions about Lupron Injection.  Answered questions.  She said she's still thinking about it.  May call back tomorrow to schedule injection time.

## 2011-03-13 ENCOUNTER — Telehealth: Payer: Self-pay | Admitting: *Deleted

## 2011-03-13 MED ORDER — CIPROFLOXACIN HCL 250 MG PO TABS: 250.0000 mg | ORAL_TABLET | Freq: Two times a day (BID) | ORAL | Status: AC

## 2011-03-13 NOTE — Telephone Encounter (Signed)
Pt decided that she didn't want to have surgery because she has not been on her job very long.Per 03/06/10 telephone encounter you requested by try Lupron injection. Pt said that pharmacy should have in the next couple of days. Pt was also informed with # 2

## 2011-03-13 NOTE — Telephone Encounter (Signed)
Pt called c/o left side lower pelvic pain since last night. Pt has frequent urination, burning with urination. No other symptoms no fever or blood in urine. Pt has not tried any OTC. Last appointment 03/06/11. Please advise

## 2011-03-13 NOTE — Telephone Encounter (Signed)
#  1 we have discussed about arranging hysterectomy with last office visit.  Are we in the process of arranging this?  #2 going to cover her for a UTI with ciprofloxacin 250 twice a day x3 days. Trial of OTC Motrin 400-600 mg every 6 hours. Office visit if her pain persists.

## 2011-03-26 ENCOUNTER — Ambulatory Visit (INDEPENDENT_AMBULATORY_CARE_PROVIDER_SITE_OTHER): Payer: Self-pay | Admitting: Pharmacist

## 2011-03-26 ENCOUNTER — Encounter: Payer: Self-pay | Admitting: Pharmacist

## 2011-03-26 DIAGNOSIS — O3410 Maternal care for benign tumor of corpus uteri, unspecified trimester: Secondary | ICD-10-CM

## 2011-03-26 DIAGNOSIS — F418 Other specified anxiety disorders: Secondary | ICD-10-CM

## 2011-03-26 DIAGNOSIS — D259 Leiomyoma of uterus, unspecified: Secondary | ICD-10-CM

## 2011-03-26 DIAGNOSIS — F341 Dysthymic disorder: Secondary | ICD-10-CM

## 2011-03-26 NOTE — Progress Notes (Signed)
  Subjective:    Patient ID: Ruth Robertson, female    DOB: December 07, 1965, 46 y.o.   MRN: [id number]  HPI  Patient arrives in good spirits for medication review.  Reports seeing Dr. Lilyan Punt as primary care provider and Dr. Audie Box OB/GYN (prescribes Lupron).  Reports being diagnosed with gynecologic fibroids for 7 years. Was first diagnosed in 2006 and had a DNC done and had not had further issues until about September 2012. She states that she has not yet begun Lupron therapy due to high the high co-pay but plans to start once the cheaper medication cost comes through.     Review of Systems     Objective:   Physical Exam        Assessment & Plan:   Following medication review, no suggestions for change.  Complete medication list provided to patient.  Total time in face to face medication review: 15 minutes.  Patient seen with: Su Hilt, PharmD Candidate and Benjaman Pott, Pharmacy Resident.

## 2011-03-26 NOTE — Assessment & Plan Note (Signed)
Following medication review, no suggestions for change.  Complete medication list provided to patient.  Total time in face to face medication review: 15 minutes.  Patient seen with: Su Hilt, PharmD Candidate and Benjaman Pott, Pharmacy Resident.

## 2011-03-26 NOTE — Progress Notes (Signed)
  Subjective:    Patient ID: Ruth Robertson, female    DOB: December 02, 1965, 46 y.o.   MRN: [id number]  HPI Reviewed and agree with Dr. Macky Lower management.    Review of Systems     Objective:   Physical Exam        Assessment & Plan:

## 2011-04-05 ENCOUNTER — Ambulatory Visit (INDEPENDENT_AMBULATORY_CARE_PROVIDER_SITE_OTHER): Payer: 59 | Admitting: *Deleted

## 2011-04-05 DIAGNOSIS — D259 Leiomyoma of uterus, unspecified: Secondary | ICD-10-CM

## 2011-04-05 MED ORDER — LEUPROLIDE ACETATE (3 MONTH) 11.25 MG IM KIT
11.2500 mg | PACK | Freq: Once | INTRAMUSCULAR | Status: AC
  Administered 2011-04-05: 11.25 mg via INTRAMUSCULAR

## 2011-04-22 ENCOUNTER — Telehealth: Payer: Self-pay | Admitting: *Deleted

## 2011-04-22 NOTE — Telephone Encounter (Signed)
Pt called c/o vaginal bleeding with lupron injection on 04/05/11, pt said bleeding is now light. Pt had normal cycle flow no heavy bleeding or cramping. Pt will follow up if bleeding continues.

## 2011-05-09 ENCOUNTER — Telehealth: Payer: Self-pay | Admitting: *Deleted

## 2011-05-09 NOTE — Telephone Encounter (Signed)
If pregnancy a possibility at all I would check a qualitative hCG and then at this point I would just watch for now nonuse of have some light bleeding that hopefully will resolve in the next week or 2. If she continues to bleed then let us know I may add Megace to see if we can stop the bleeding

## 2011-05-09 NOTE — Telephone Encounter (Signed)
Pt had Lupron injection 04/05/11 pt said that she has bleeding march 9-18 th that she thought was normal cycle and then has some spotting in that time frame as well. Pt is no longer bleeding, but when she goes to the bathroom and wipes sometimes there is trace of blood. Pt doesn't see blood all the time, c/o light tingling, slight back discomfort, frequent urination but this is not new for her. Urine is not cloudy nor does it have a smell. Please advise

## 2011-05-10 NOTE — Telephone Encounter (Signed)
Pt said no chance or pregnancy. She will watch for now and call back if bleeding continues.

## 2011-05-10 NOTE — Telephone Encounter (Signed)
Left message for pt to call.

## 2011-05-14 ENCOUNTER — Telehealth: Payer: Self-pay | Admitting: *Deleted

## 2011-05-14 NOTE — Telephone Encounter (Signed)
Pt called c/o to follow up last telephone encounter conversation. Pt informed with watch for now and call back in 1 week or 2. Pt okay with this and will do.

## 2011-05-30 ENCOUNTER — Telehealth: Payer: Self-pay | Admitting: *Deleted

## 2011-05-30 NOTE — Telephone Encounter (Signed)
Telephone call to review problem, states only spotting on toilet tissue /no heavy bleeding. Had Lupron in March, states  pain free, having only occasional hot flashes. Reviewed will watch at this time and if Dr. Audie Box would like a different plan we will call.

## 2011-05-30 NOTE — Telephone Encounter (Signed)
(  pt is aware TF is out of the office) Pt is calling to follow up previous telephone encounter, still having spotting while wiping. Pt has some back pain, but claims this has always been there. No burning with urination, LMP 9-18 cycle. Please advise

## 2011-06-05 ENCOUNTER — Other Ambulatory Visit: Payer: Self-pay | Admitting: Gynecology

## 2011-06-05 DIAGNOSIS — Z1231 Encounter for screening mammogram for malignant neoplasm of breast: Secondary | ICD-10-CM

## 2011-07-22 ENCOUNTER — Ambulatory Visit
Admission: RE | Admit: 2011-07-22 | Discharge: 2011-07-22 | Disposition: A | Discharge status: Home / Self Care | Payer: 59 | Source: Ambulatory Visit | Attending: Gynecology | Admitting: Gynecology

## 2011-07-22 DIAGNOSIS — Z1231 Encounter for screening mammogram for malignant neoplasm of breast: Secondary | ICD-10-CM

## 2011-08-02 ENCOUNTER — Encounter: Payer: Self-pay | Admitting: Gynecology

## 2011-08-02 ENCOUNTER — Ambulatory Visit (INDEPENDENT_AMBULATORY_CARE_PROVIDER_SITE_OTHER): Payer: 59 | Admitting: Gynecology

## 2011-08-02 VITALS — BP 106/70 | Ht 65.5 in | Wt 138.0 lb

## 2011-08-02 DIAGNOSIS — Z01419 Encounter for gynecological examination (general) (routine) without abnormal findings: Secondary | ICD-10-CM

## 2011-08-02 DIAGNOSIS — N926 Irregular menstruation, unspecified: Secondary | ICD-10-CM

## 2011-08-02 DIAGNOSIS — D259 Leiomyoma of uterus, unspecified: Secondary | ICD-10-CM

## 2011-08-02 MED ORDER — MEGESTROL ACETATE 20 MG PO TABS: 20.0000 mg | ORAL_TABLET | Freq: Every day | ORAL | Status: AC

## 2011-08-02 NOTE — Patient Instructions (Signed)
Let us know when you're ready to schedule surgery.

## 2011-08-02 NOTE — Progress Notes (Signed)
Ruth Robertson 08/01/1965 [id number]        46 y.o.  for annual exam.  Several issues noted below.  Past medical history,surgical history, medications, allergies, family history and social history were all reviewed and documented in the EPIC chart. ROS:  Was performed and pertinent positives and negatives are included in the history.  Exam: Sherrilyn Rist assistant Filed Vitals:   08/02/11 1554  BP: 106/70   General appearance  Normal Skin grossly normal Head/Neck normal with no cervical or supraclavicular adenopathy thyroid normal Lungs  clear Cardiac RR, without RMG Abdominal  soft, nontender, without masses, organomegaly or hernia Breasts  examined lying and sitting without masses, retractions, discharge or axillary adenopathy. Pelvic  Ext/BUS/vagina  normal   Cervix  normal   Uterus  anteverted, normal size, mild irregular shape and contour, midline and mobile nontender   Adnexa  Without masses or tenderness    Anus and perineum  normal   Rectovaginal  normal sphincter tone without palpated masses or tenderness.    Assessment/Plan:  46 y.o. female for annual exam.    1. History irregular menses.  Evaluation showed normal hormone levels with sonohysterogram showing multiple small myomas, negative cavity, normal endometrial sample.  Options for management to include hormonal manipulation such as intermittent progesterone, Mirena IUD, endometrial ablation, hysterectomy was discussed with her. She is not using consistent contraception and I do not think ablation would be a good choice without contraception. She does smoke and I think estrogen-containing manipulation is contraindicated.  She ultimately decided on hysterectomy and due to the bulk of her uterus,irregular bleeding and need to postpone surgery for several months we treated her with a 3 month course of Depo-Lupron which expired the beginning of June. She had postponed proceeding with hysterectomy due to work issues and now is  looking at September for scheduling her surgery. I reviewed with her that now she is off the Depo-Lupron she will probably resume her bleeding and the need for consistent contraception was discussed. Options to treat her now with progesterone only to suppress menses or Depo-Lupron again possible Mirena IUD all revisited and the patient just wants to observe at present. She is going to the beach in [place] and is worried that she will start bleeding while she is there. I gave her a prescription for Megace 20 mg #20 to take 1-2 daily to suppress menses if she does start to bleed. I stressed the need to use contraception and to check a UPT before starting the Megace. She will let us know as soon as she can when she wants to schedule the surgery and will go ahead and do this. Her uterus has shrunk and now is roughly normal size I think that we can proceed with a vaginal hysterectomy. 2. Mammography. Patient had mammogram this month which was normal. We'll continue with annual mammography. SBE monthly reviewed. 3. Pap smear. Last Pap smear 2012. No Pap smear done today. She has no history of abnormal Pap smears in per current screening guidelines we will plan every 3-5 your Pap smears. She does proceed with hysterectomy I discussed possible stop it altogether. 4. Stop smoking. 5. Health maintenance. The blood work was done today as she has a normal lipid profile, glucose and CBC last year.    Dara Lords MD, 4:31 PM 08/02/2011

## 2011-08-03 LAB — URINALYSIS W MICROSCOPIC + REFLEX CULTURE
Bacteria, UA: NONE SEEN
Bilirubin Urine: NEGATIVE
Casts: NONE SEEN
Glucose, UA: NEGATIVE mg/dL
Ketones, ur: NEGATIVE mg/dL
Leukocytes, UA: NEGATIVE
Nitrite: NEGATIVE
Protein, ur: NEGATIVE mg/dL
Specific Gravity, Urine: 1.029 (ref 1.005–1.030)
Urobilinogen, UA: 0.2 mg/dL (ref 0.0–1.0)
pH: 5.5 (ref 5.0–8.0)

## 2011-08-04 LAB — URINE CULTURE
Colony Count: NO GROWTH
Organism ID, Bacteria: NO GROWTH

## 2011-10-15 ENCOUNTER — Telehealth: Payer: Self-pay | Admitting: Gynecology

## 2011-10-15 NOTE — Telephone Encounter (Signed)
Patient said she had discussed hysterectomy with you at last office visit. Now the last two months her period has been regular and normal flow. Patient wondered if she should just hold off and observe or if you wanted her to go on with the plan.  (Of note, patient insisted to me that if she has hysterectomy she wants ovaries out. She said history of ov cysts.)  Also, patient inquired what having a hysterectomy will do to her in terms of weight gain.  I told her it should not affect her weight/appetite.

## 2011-10-15 NOTE — Telephone Encounter (Signed)
Patient advised.

## 2011-10-15 NOTE — Telephone Encounter (Signed)
It is a quality of life issue for her. We have evaluated her and does not appear to have any significant disease other than her leiomyoma. If her menses are regular and she is willing to tolerate her bleeding pattern, she can postpone hysterectomy.  It is really her decision.

## 2012-03-21 ENCOUNTER — Other Ambulatory Visit: Payer: Self-pay

## 2012-05-20 ENCOUNTER — Telehealth: Payer: Self-pay

## 2012-05-20 NOTE — Telephone Encounter (Signed)
Patient really needs office visit to evaluate before prescribing medication

## 2012-05-20 NOTE — Telephone Encounter (Signed)
Patient advised. She already has appt scheduled for tomorrow.

## 2012-05-20 NOTE — Telephone Encounter (Signed)
Patient said after Lupron her bleeding problems resolved for awhile. Then had mense 2/15,/3/8/ 3/20 and now since April 9th. She said this period started out heavy last week but has lightened up to where it is more than spotting still.  She said she is weak from it.  She called earlier this morning and scheduled appointment for tomorrow to see you but is asking if maybe there is something you could call in to stop the bleeding.

## 2012-05-21 ENCOUNTER — Ambulatory Visit (INDEPENDENT_AMBULATORY_CARE_PROVIDER_SITE_OTHER): Payer: 59 | Admitting: Gynecology

## 2012-05-21 ENCOUNTER — Encounter: Payer: Self-pay | Admitting: Gynecology

## 2012-05-21 DIAGNOSIS — N920 Excessive and frequent menstruation with regular cycle: Secondary | ICD-10-CM

## 2012-05-21 DIAGNOSIS — D259 Leiomyoma of uterus, unspecified: Secondary | ICD-10-CM

## 2012-05-21 DIAGNOSIS — N926 Irregular menstruation, unspecified: Secondary | ICD-10-CM

## 2012-05-21 LAB — CBC WITH DIFFERENTIAL/PLATELET
Basophils Absolute: 0.1 10*3/uL (ref 0.0–0.1)
Basophils Relative: 1 % (ref 0–1)
Eosinophils Absolute: 0.4 10*3/uL (ref 0.0–0.7)
Eosinophils Relative: 4 % (ref 0–5)
HCT: 43.7 % (ref 36.0–46.0)
Hemoglobin: 14.7 g/dL (ref 12.0–15.0)
Lymphocytes Relative: 23 % (ref 12–46)
Lymphs Abs: 2.1 10*3/uL (ref 0.7–4.0)
MCH: 30.6 pg (ref 26.0–34.0)
MCHC: 33.6 g/dL (ref 30.0–36.0)
MCV: 90.9 fL (ref 78.0–100.0)
Monocytes Absolute: 0.7 10*3/uL (ref 0.1–1.0)
Monocytes Relative: 8 % (ref 3–12)
Neutro Abs: 5.9 10*3/uL (ref 1.7–7.7)
Neutrophils Relative %: 64 % (ref 43–77)
Platelets: 321 10*3/uL (ref 150–400)
RBC: 4.81 MIL/uL (ref 3.87–5.11)
RDW: 13.6 % (ref 11.5–15.5)
WBC: 9.1 10*3/uL (ref 4.0–10.5)

## 2012-05-21 MED ORDER — MEGESTROL ACETATE 20 MG PO TABS: 20.0000 mg | ORAL_TABLET | Freq: Every day | ORAL | Status: DC

## 2012-05-21 NOTE — Progress Notes (Signed)
Patient presents with heavy irregular bleeding. She was evaluated the end of 2012 to include an ultrasound showed multiple small myomas and a negative endometrial biopsy. Received Depo-Lupron in anticipation of surgery. Her bleeding subsided and she did well and ultimately canceled her surgery. Her bleeding now has returned where she is using double action, bleed 2 episodes having menses up to every 2 weeks.  Exam with Kim assistant Abdomen soft nontender without masses guarding rebound or organomegaly. Pelvic external BUS vagina normal. Cervix normal. Uterus mildly enlarged midline mobile nontender. Adnexa without masses or tenderness.  Assessment and plan: Irregular heavy menses. Prior evaluation consistent with multiple small myomas. I again reviewed options to include hormonal manipulation, Mirena IUD, endometrial ablation, hysterectomy. She does smoke cigarettes I do not think estrogen containing products wise.   Exam feels compatible with a vaginal hysterectomy. I reviewed with involved with this concludes the expected intraoperative postoperative courses. The ovarian conservation and she was also reviewed and my recommendation to maintain her ovaries as she does not have a strong family history of ovarian cancer disease. She does understand though that she would be at risk for ovarian disease to include ovarian cancer if she does keep her ovaries. Patient is not consistently using contraception and not much her ablation would be a good choice and I did recommend barrier contraception at present.  She does want to move towards hysterectomy and we'll go ahead and schedule this. I gave her prescription for Megace 20 mg #30 to be used if her periods become very heavy to help lighten them. We'll check baseline CBC today. Do not feel any further workup needed given her prior evaluation in that really nothing has changed in her bleeding pattern after the Lupron wore off. Possible need to go on Megace daily for  suppression also discussed but at this point we'll try to intermittently. Again I asked her to use condoms consistently she understands that Megace is not a contraceptive

## 2012-05-21 NOTE — Patient Instructions (Signed)
Office will call you to schedule hysterectomy

## 2012-05-27 ENCOUNTER — Telehealth: Payer: Self-pay

## 2012-05-27 NOTE — Telephone Encounter (Signed)
Spoke with patient regarding scheduling [facility].  We discussed insurance benefits and financial responsibility. Patient will consider and call when ready to schedule.

## 2012-06-19 ENCOUNTER — Other Ambulatory Visit: Payer: Self-pay

## 2012-06-19 DIAGNOSIS — Z1231 Encounter for screening mammogram for malignant neoplasm of breast: Secondary | ICD-10-CM

## 2012-07-01 ENCOUNTER — Ambulatory Visit (INDEPENDENT_AMBULATORY_CARE_PROVIDER_SITE_OTHER): Payer: 59 | Admitting: Nurse Practitioner

## 2012-07-01 ENCOUNTER — Encounter: Payer: Self-pay | Admitting: Nurse Practitioner

## 2012-07-01 VITALS — BP 110/68 | Temp 98.5°F | Wt 138.4 lb

## 2012-07-01 DIAGNOSIS — D649 Anemia, unspecified: Secondary | ICD-10-CM

## 2012-07-01 DIAGNOSIS — N939 Abnormal uterine and vaginal bleeding, unspecified: Secondary | ICD-10-CM

## 2012-07-01 DIAGNOSIS — N898 Other specified noninflammatory disorders of vagina: Secondary | ICD-10-CM

## 2012-07-01 DIAGNOSIS — R300 Dysuria: Secondary | ICD-10-CM

## 2012-07-01 LAB — POCT UA - MICROSCOPIC ONLY
Bacteria, U Microscopic: 0
WBC, Ur, HPF, POC: 0

## 2012-07-01 LAB — POCT URINALYSIS DIPSTICK
Blood, UA: 250
Spec Grav, UA: >=1.03
pH, UA: 7

## 2012-07-01 LAB — POCT HEMOGLOBIN: Hemoglobin: 11 g/dL — AB (ref 12.2–16.2)

## 2012-07-01 MED ORDER — NITROFURANTOIN MONOHYD MACRO 100 MG PO CAPS: 100.0000 mg | ORAL_CAPSULE | Freq: Two times a day (BID) | ORAL | Status: DC

## 2012-07-01 NOTE — Progress Notes (Signed)
Subjective:  Presents for complaints of urinary frequency with minimal urgency over the past 2 weeks. No fever. Some low back pain but no mid back pain. Patient is seeing Dr. Audie Box her gynecologist, has been diagnosed with fibroids and endometriosis. Is planning a hysterectomy later this summer. Has been on Lupron injections. Now on Megace. Is on the fifth day, still having some light spotting. Wonders about a possible bladder infection. Some urinary frequency. Minimal urgency. Possible low grade fever. Low back pain. No CVA area pain or tenderness. No nausea vomiting. Possible blood with urination but patient states is hard to tell because of her vaginal bleeding. No history of recent UTI. Patient is getting ready to leave on vacation.  Objective:   BP 110/68  Temp(Src) 98.5 F (36.9 C) (Oral)  Wt 138 lb 6.4 oz (62.778 kg)  BMI 22.67 kg/m2 NAD. Alert, oriented. Lungs clear. No CVA area tenderness. Heart regular rate rhythm. Abdomen soft nondistended with mild pelvic area tenderness. No rebound or guarding. No obvious masses. Urine microscopic occasional epithelial cell and RBC, otherwise normal limit. Urine very dilute.  Assessment: Abnormal vaginal bleeding - Plan: POCT hemoglobin, POCT UA - Microscopic Only, POCT urinalysis dipstick  Dysuria  Anemia  Plan: As a precaution, given a prescription for 7 days of Macrobid. Also start daily multivitamin with iron over-the-counter. To contact her gynecologist regarding continued bleeding and mild anemia. Call back here if any further problems.

## 2012-07-02 DIAGNOSIS — N939 Abnormal uterine and vaginal bleeding, unspecified: Secondary | ICD-10-CM | POA: Insufficient documentation

## 2012-07-08 ENCOUNTER — Telehealth: Payer: Self-pay

## 2012-07-08 NOTE — Telephone Encounter (Signed)
Patient advised.

## 2012-07-08 NOTE — Telephone Encounter (Signed)
I would actually recommend taking iron twice daily if she can tolerate it. The higher we get her blood count preoperatively the lower the risk of transfusion.

## 2012-07-08 NOTE — Telephone Encounter (Signed)
Patient called to discuss costs of surgery again and possibly scheduling [facility].  She is still considering when she can do it and is going to talk to work.  She mentioned that on 07/01/12 she saw her PCP and her hgb was 11.0.  They told her to take multi-vit with iron. She questioned if anything else she should do or might that be a problem when she gets ready for hysterectomy.  I told her you may wants her to take a one a day iron tablet and i would check with you and see what you recommend.

## 2012-07-10 ENCOUNTER — Telehealth: Payer: Self-pay

## 2012-07-10 NOTE — Telephone Encounter (Signed)
Of note, she stopped the Megace because it was not helping.

## 2012-07-10 NOTE — Telephone Encounter (Signed)
Patient called in voice mail saying she is ready to schedule her surgery. LM for her to call me.

## 2012-07-10 NOTE — Telephone Encounter (Signed)
Patient informed. Appt scheduled.

## 2012-07-10 NOTE — Telephone Encounter (Signed)
Patient called to discuss scheduling surgery. Couple of issues:  #1.  Patient had menses in May 9 and then her bleeding got light like it was stopping but them got heavy again.  She had some Megace on hand and took 20 mg daily for 2 weeks and for 3-4 of those days she took bid.  She said nothing affected it and it still persists. Goes back in forth from light to heavy.  Her iron level was low at PCP the other day and you had recommended she get on iron tab bid and she has.  Do you want to give her anything to try to stop this bleeding?  #2 She is planning a beach vacation for the first or second week in August. She asked if she could have surgery mid July, do her 2 week post-op check up and then did you think ok for her to go to the beach after post op visit.  She promises someone else would carry her suitcase, chair, etc. She will just sit on the beach and recover.

## 2012-07-10 NOTE — Telephone Encounter (Signed)
Going to the beach 2 weeks after her postop check would be okay assuming it was normal. But as far as the other bleeding she needs an office visit. Depo-Lupron may be a better choice to suppress things as that seem to work before but unfortunately that would put [place] into scheduling her surgery 2-3 months down the road. Regardless, recommend office visit to decide the best course of action.

## 2012-07-15 ENCOUNTER — Ambulatory Visit (INDEPENDENT_AMBULATORY_CARE_PROVIDER_SITE_OTHER): Payer: 59 | Admitting: Gynecology

## 2012-07-15 ENCOUNTER — Encounter: Payer: Self-pay | Admitting: Gynecology

## 2012-07-15 DIAGNOSIS — D259 Leiomyoma of uterus, unspecified: Secondary | ICD-10-CM

## 2012-07-15 DIAGNOSIS — N920 Excessive and frequent menstruation with regular cycle: Secondary | ICD-10-CM

## 2012-07-15 LAB — CBC WITH DIFFERENTIAL/PLATELET
Basophils Absolute: 0 10*3/uL (ref 0.0–0.1)
Basophils Relative: 0 % (ref 0–1)
Eosinophils Absolute: 0.2 10*3/uL (ref 0.0–0.7)
Eosinophils Relative: 3 % (ref 0–5)
HCT: 44.7 % (ref 36.0–46.0)
Hemoglobin: 15 g/dL (ref 12.0–15.0)
Lymphocytes Relative: 19 % (ref 12–46)
Lymphs Abs: 1.5 10*3/uL (ref 0.7–4.0)
MCH: 30.5 pg (ref 26.0–34.0)
MCHC: 33.6 g/dL (ref 30.0–36.0)
MCV: 91 fL (ref 78.0–100.0)
Monocytes Absolute: 0.4 10*3/uL (ref 0.1–1.0)
Monocytes Relative: 6 % (ref 3–12)
Neutro Abs: 5.7 10*3/uL (ref 1.7–7.7)
Neutrophils Relative %: 72 % (ref 43–77)
Platelets: 263 10*3/uL (ref 150–400)
RBC: 4.91 MIL/uL (ref 3.87–5.11)
RDW: 13.3 % (ref 11.5–15.5)
WBC: 7.9 10*3/uL (ref 4.0–10.5)

## 2012-07-15 MED ORDER — MEGESTROL ACETATE 20 MG PO TABS: 20.0000 mg | ORAL_TABLET | Freq: Two times a day (BID) | ORAL | Status: DC

## 2012-07-15 NOTE — Addendum Note (Signed)
Addended by: Dara Lords on: 07/15/2012 12:15 PM   Modules accepted: Orders, Medications

## 2012-07-15 NOTE — Patient Instructions (Signed)
Start on Megace one tablet twice daily for several days and then once daily through your surgery. Report any heavy bleeding. He may continue to spot on and off. Continue on daily iron supplement. Continue to use contraception. Followup for ultrasound as scheduled.

## 2012-07-15 NOTE — Progress Notes (Signed)
Patient presents with episode of heavy vaginal bleeding. Now has tapered off. She took Megace she had at home but it seemed to continue despite this. Her history is as follows her for/17/2014 note:  " Patient presents with heavy irregular bleeding. She was evaluated the end of 2012 to include an ultrasound showed multiple small myomas and a negative endometrial biopsy. Received Depo-Lupron in anticipation of surgery. Her bleeding subsided and she did well and ultimately canceled her surgery. Her bleeding now has returned where she is using double action, bleed 2 episodes having menses up to every 2 weeks.   Assessment and plan: Irregular heavy menses. Prior evaluation consistent with multiple small myomas. I again reviewed options to include hormonal manipulation, Mirena IUD, endometrial ablation, hysterectomy. She does smoke cigarettes I do not think estrogen containing products wise. Exam feels compatible with a vaginal hysterectomy. I reviewed with involved with this concludes the expected intraoperative postoperative courses. The ovarian conservation and she was also reviewed and my recommendation to maintain her ovaries as she does not have a strong family history of ovarian cancer disease. She does understand though that she would be at risk for ovarian disease to include ovarian cancer if she does keep her ovaries. Patient is not consistently using contraception and not much her ablation would be a good choice and I did recommend barrier contraception at present. She does want to move towards hysterectomy and we'll go ahead and schedule this. I gave her prescription for Megace 20 mg #30 to be used if her periods become very heavy to help lighten them. We'll check baseline CBC today. Do not feel any further workup needed given her prior evaluation in that really nothing has changed in her bleeding pattern after the Lupron wore off. Possible need to go on Megace daily for suppression also discussed but at  this point we'll try to intermittently. Again I asked her to use condoms consistently she understands that Megace is not a contraceptive"  Exam today with Kim Asst. Abdomen soft nontender without masses guarding rebound organomegaly.  External BUS vagina normal with scant bleeding. Cervix normal. Uterus anteverted mildly enlarged irregular, mildly tender. Adnexa without masses or tenderness.  Assessment and plan: Scant bleeding now. Recommended Megace 20 mg twice daily for several days and then once daily through planned surgery. She does have hysterectomy planned mid July. I did recommend baseline ultrasound now just to make sure there is nothing else going on as far as a variant and to reassess her leiomyoma. She did have questions again about her ovaries and she is leaning towards wanting her ovaries removed. I again discussed the issues of premature menopause with accelerated cardiovascular and osteoporotic risks as well as symptoms. The issues of HRT to include increased risk of stroke heart attack DVT and breast cancer. Benefits of transdermal or oral all reviewed. Patient will think about this and make up her mind as far as her ovaries. I am going to check a CBC today. She is on iron and have recommended she stay on iron daily if not twice daily if she can. She understands the better her blood count to less risk of transfusion with the surgery. We'll also check FSH as if elevated may help with ovarian conservation decision. Lastly we'll check hCG for reassurance. She is using condoms consistently.

## 2012-07-16 ENCOUNTER — Other Ambulatory Visit: Payer: Self-pay | Admitting: Gynecology

## 2012-07-16 ENCOUNTER — Telehealth: Payer: Self-pay

## 2012-07-16 LAB — FOLLICLE STIMULATING HORMONE: FSH: 67.6 m[IU]/mL

## 2012-07-16 LAB — HCG, QUANTITATIVE, PREGNANCY: hCG, Beta Chain, Quant, S: <2 m[IU]/mL

## 2012-07-16 NOTE — Telephone Encounter (Signed)
Patient's hgb was 15.0 when you checked it yesterday (after having been 11.0 two weeks ago).  She has been on one a day iron tablet x 2 weeks and asked if she still needs to take it.

## 2012-07-18 NOTE — Telephone Encounter (Signed)
If she is not having sig side effects from it, I would continue it

## 2012-07-20 NOTE — Telephone Encounter (Signed)
Patient informed. 

## 2012-07-27 ENCOUNTER — Ambulatory Visit
Admission: RE | Admit: 2012-07-27 | Discharge: 2012-07-27 | Disposition: A | Discharge status: Home / Self Care | Payer: 59 | Source: Ambulatory Visit

## 2012-07-27 DIAGNOSIS — Z1231 Encounter for screening mammogram for malignant neoplasm of breast: Secondary | ICD-10-CM

## 2012-07-28 ENCOUNTER — Encounter: Payer: Self-pay | Admitting: *Deleted

## 2012-07-28 ENCOUNTER — Telehealth: Payer: Self-pay

## 2012-07-28 NOTE — Telephone Encounter (Signed)
Okay 

## 2012-07-28 NOTE — Telephone Encounter (Signed)
Patient informed okay. She will bring FMLA form back at her visit tomorrow and I will revise it, hopefully, while she is here.

## 2012-07-28 NOTE — Telephone Encounter (Signed)
Patient is scheduled for Vag Hyst. Has a sedentary job.  Her FMLA forms were completed, as I usually do,  with estimated return to work date for four weeks post op.  Patient called today and asked if I could change it to 4-8 weeks to cover her in case she is not ready to go back to work at 4 weeks.  I would have to make the return to work date the farthest date 8 weeks out.  Ok?

## 2012-07-29 ENCOUNTER — Encounter: Payer: Self-pay | Admitting: Gynecology

## 2012-07-29 ENCOUNTER — Ambulatory Visit (INDEPENDENT_AMBULATORY_CARE_PROVIDER_SITE_OTHER): Payer: 59

## 2012-07-29 ENCOUNTER — Ambulatory Visit (INDEPENDENT_AMBULATORY_CARE_PROVIDER_SITE_OTHER): Payer: 59 | Admitting: Family Medicine

## 2012-07-29 ENCOUNTER — Ambulatory Visit (INDEPENDENT_AMBULATORY_CARE_PROVIDER_SITE_OTHER): Payer: 59 | Admitting: Gynecology

## 2012-07-29 ENCOUNTER — Encounter: Payer: Self-pay | Admitting: Family Medicine

## 2012-07-29 VITALS — BP 112/80 | HR 70 | Ht 66.0 in | Wt 134.0 lb

## 2012-07-29 DIAGNOSIS — N920 Excessive and frequent menstruation with regular cycle: Secondary | ICD-10-CM

## 2012-07-29 DIAGNOSIS — D259 Leiomyoma of uterus, unspecified: Secondary | ICD-10-CM

## 2012-07-29 DIAGNOSIS — F329 Major depressive disorder, single episode, unspecified: Secondary | ICD-10-CM

## 2012-07-29 DIAGNOSIS — F411 Generalized anxiety disorder: Secondary | ICD-10-CM

## 2012-07-29 DIAGNOSIS — D251 Intramural leiomyoma of uterus: Secondary | ICD-10-CM

## 2012-07-29 DIAGNOSIS — F32A Depression, unspecified: Secondary | ICD-10-CM

## 2012-07-29 MED ORDER — SERTRALINE HCL 100 MG PO TABS: 100.0000 mg | ORAL_TABLET | Freq: Every day | ORAL | Status: DC

## 2012-07-29 NOTE — Progress Notes (Signed)
  Subjective:    Patient ID: Ruth Robertson, female    DOB: 1965-07-24, 47 y.o.   MRN: [id number]  Anxiety Presents for initial visit. Onset was 1 to 4 weeks ago. The problem has been gradually worsening. Symptoms include nervous/anxious behavior and shortness of breath. Symptoms occur constantly. The severity of symptoms is causing significant distress. Nothing aggravates the symptoms. Nighttime awakenings: one to two.   There are no known risk factors. Treatments tried: wellbutrin, xanax. The treatment provided no relief. Compliance with prior treatments has been good.   Patient is been having very difficult time. She has some uncertainty in her workplace, she is having relationship issues, she's having some family stress, she is also facing a hysterectomy next month and she is going through menopause. She denies being suicidal.   Review of Systems  Respiratory: Positive for shortness of breath.   Psychiatric/Behavioral: The patient is nervous/anxious.        Objective:   Physical Exam  25-30 minutes was spent discussing issues with the patient in helping her with an approach.      Assessment & Plan:  Anxiety/depression-stop Wellbutrin. Start Zoloft 100 mg take a half tablet daily for the first week then advance to one tablet daily recommend that the patient followup in the course of the next few weeks with a phone call or a message. Then followup office visit in August. I also highly recommend counseling we will go ahead and refer.

## 2012-07-29 NOTE — Progress Notes (Signed)
Patient presents for ultrasound. She is scheduled for a vaginal hysterectomy in July with leiomyoma and I want to recheck the size as well as clear her ovaries preoperatively.  Ultrasound shows uterus enlarged with multiple small myomas the largest measuring 31 mm. Right and left ovaries normal. Endometrial echo 3.0 mm.  Assessment and plan: Leiomyoma heavy irregular bleeding or vaginal hysterectomy. Currently on Megace for menstrual suppression. Recent hemoglobin 15. Patient will followup for her preoperative appointment. I did answer some general questions about the postoperative course and care.

## 2012-07-29 NOTE — Patient Instructions (Signed)
Follow up for your preoperative appointment as scheduled 

## 2012-07-29 NOTE — Patient Instructions (Signed)
zoloft- use 1/2 tablet for first 1 to 2 weeks.  If tolerating well then go to 1 tablet per day  Stop wellbutrin.  Follow up in August. Send me an update in a few weeks, call if problems

## 2012-08-04 ENCOUNTER — Encounter (HOSPITAL_COMMUNITY): Payer: Self-pay

## 2012-08-12 ENCOUNTER — Encounter (HOSPITAL_COMMUNITY): Payer: Self-pay

## 2012-08-12 ENCOUNTER — Encounter (HOSPITAL_COMMUNITY)
Admission: RE | Admit: 2012-08-12 | Discharge: 2012-08-12 | Disposition: A | Discharge status: Home / Self Care | Payer: 59 | Source: Ambulatory Visit | Attending: Gynecology | Admitting: Gynecology

## 2012-08-12 HISTORY — DX: Nausea with vomiting, unspecified: R11.2

## 2012-08-12 HISTORY — DX: Other specified postprocedural states: Z98.890

## 2012-08-12 LAB — CBC
HCT: 42.7 % (ref 36.0–46.0)
Hemoglobin: 14.4 g/dL (ref 12.0–15.0)
MCH: 31.4 pg (ref 26.0–34.0)
MCHC: 33.7 g/dL (ref 30.0–36.0)
MCV: 93 fL (ref 78.0–100.0)
Platelets: 261 10*3/uL (ref 150–400)
RBC: 4.59 MIL/uL (ref 3.87–5.11)
RDW: 13.6 % (ref 11.5–15.5)
WBC: 9.6 10*3/uL (ref 4.0–10.5)

## 2012-08-12 NOTE — Patient Instructions (Addendum)
[facility]  08/12/2012   Your procedure is scheduled on:  08/18/12  Enter through the Main Entrance of [facility] at 1130 AM.  Pick up the phone at the desk and dial [phone].   Call this number if you have problems the morning of surgery: [phone]   Remember:   Do not eat food:After Midnight.  Do not drink clear liquids: 4 Hours before arrival.  Take these medicines the morning of surgery with A SIP OF WATER: None   Do not wear jewelry, make-up or nail polish.  Do not wear lotions, powders, or perfumes. You may wear deodorant.  Do not shave 48 hours prior to surgery.  Do not bring valuables to the hospital.  [facility] is not responsible                  for any belongings or valuables brought to the hospital.  Contacts, dentures or bridgework may not be worn into surgery.  Leave suitcase in the car. After surgery it may be brought to your room.  For patients admitted to the hospital, checkout time is 11:00 AM the day of                discharge.   Patients discharged the day of surgery will not be allowed to drive                   home.  Name and phone number of your driver: NA  Special Instructions: Shower using CHG 2 nights before surgery and the night before surgery.  If you shower the day of surgery use CHG.  Use special wash - you have one bottle of CHG for all showers.  You should use approximately 1/3 of the bottle for each shower.   Please read over the following fact sheets that you were given: Surgical Site Infection Prevention     +

## 2012-08-14 ENCOUNTER — Ambulatory Visit (INDEPENDENT_AMBULATORY_CARE_PROVIDER_SITE_OTHER): Payer: 59 | Admitting: Gynecology

## 2012-08-14 ENCOUNTER — Encounter: Payer: Self-pay | Admitting: Gynecology

## 2012-08-14 VITALS — BP 118/70 | Ht 65.0 in | Wt 136.0 lb

## 2012-08-14 DIAGNOSIS — Z01419 Encounter for gynecological examination (general) (routine) without abnormal findings: Secondary | ICD-10-CM

## 2012-08-14 DIAGNOSIS — N920 Excessive and frequent menstruation with regular cycle: Secondary | ICD-10-CM

## 2012-08-14 LAB — URINALYSIS W MICROSCOPIC + REFLEX CULTURE
Bacteria, UA: NONE SEEN
Bilirubin Urine: NEGATIVE
Casts: NONE SEEN
Crystals: NONE SEEN
Glucose, UA: NEGATIVE mg/dL
Hgb urine dipstick: NEGATIVE
Ketones, ur: NEGATIVE mg/dL
Leukocytes, UA: NEGATIVE
Nitrite: NEGATIVE
Protein, ur: NEGATIVE mg/dL
Specific Gravity, Urine: 1.013 (ref 1.005–1.030)
Urobilinogen, UA: 0.2 mg/dL (ref 0.0–1.0)
pH: 7 (ref 5.0–8.0)

## 2012-08-14 NOTE — Progress Notes (Addendum)
Ruth Robertson 04-23-1965 [id number]        47 y.o.  G1P1001 for annual exam.  Also for preoperative consult upcoming [facility].  Past medical history,surgical history, medications, allergies, family history and social history were all reviewed and documented in the EPIC chart.  ROS:  Performed and pertinent positives and negatives are included in the history, assessment and plan .  Exam: Biomedical scientist Filed Vitals:   08/14/12 0824  BP: 118/70  Height: 5\' 5"  (1.651 m)  Weight: 136 lb (61.689 kg)   General appearance  Normal Skin grossly normal Head/Neck normal with no cervical or supraclavicular adenopathy thyroid normal Lungs  clear Cardiac RR, without RMG Abdominal  soft, nontender, without masses, organomegaly or hernia Breasts  examined lying and sitting without masses, retractions, discharge or axillary adenopathy. Pelvic  Ext/BUS/vagina  normal   Cervix  normal   Uterus  axial to anteverted mildly enlarged irregular, midline and mobile nontender   Adnexa  Without masses or tenderness    Anus and perineum  normal   Rectovaginal  normal sphincter tone without palpated masses or tenderness.    Assessment/Plan:  47 y.o. G68P1001 female for annual exam.   1. Irregular heavy menses. Multiple small myomas. Negative sonohysterogram with biopsy 2 years ago. Recent ultrasound confirms multiple small myomas with endometrial echo 3 mm. Initially was planned for hysterectomy 2 years ago. Had Depo-Lupron preoperatively for hemoglobin recovery and decided afterwards to postpone surgery. Did well until this past year or so when she had recurrent heavy irregular menses with bleed through episodes requiring Megace to control. She did have an FSH which was 65. Not having overt menopausal symptoms. Options for management to include expectant management, Depo-Lupron suppression, hormonal manipulation, endometrial ablation, Mirena IUD, and hysterectomy were reviewed and the patient elects  for hysterectomy and will be admitted for vaginal hysterectomy bilateral salpingo-oophorectomy. I reviewed with the patient the proposed surgery to include the expected intraoperative and postoperative courses and the recovery period. She understands that we will attempt a vaginal hysterectomy but at any time I may convert to a laparoscopic approach or a total abdominal hysterectomy with a larger incision and a longer recovery period. The ovarian conservation issue was discussed with her. She does have an elevated FSH and options for expectant management attempting to manage her symptoms through menopause versus proceeding with surgery now discussed as well as issue as to removing or keeping her ovaries reviewed. Potential benefit even in a perimenopausal patient for ovarian conservation from a cardiovascular and bone health standpoint reviewed. The patient very strongly wants both ovaries removed. She understands the risks of menopausal symptoms that may require HRT afterwards. The women's health initiative and risks of stroke heart attack DVT and breast cancer with HRT was reviewed with her. The acceleration of bone loss as well as accelerated cardiovascular risk also discussed and the issues of prophylactic treatment in a younger woman with estrogen discussed. We will address this postoperatively but regardless she wants both ovaries removed even if this means a separate abdominal incision. The risks of the surgery were reviewed to include risk of infection requiring prolonged antibiotics, abscess or hematoma formation requiring reoperation and drainage, incisional complications if incisions are made including opening and draining of incisions, closure by secondary intention, long-term issues of keloid/cosmetics and hernia formation reviewed. The risk of hemorrhage necessitating transfusion and the risks of transfusion discussed to include transfusion reaction, hepatitis, HIV, mad cow disease and other unknown  entities reviewed. The risk of  inadvertent injury to internal organs including bowel, bladder, ureters, vessels and nerves either immediately recognized or delay recognized requiring further reparative surgeries and future reparative surgeries including bowel resection, ostomy formation, ureteral damage repair, bladder repair was all discussed understood and accepted. Sexuality following hysterectomy to include persistent orgasmic dysfunction and persistent dyspareunia were reviewed. The absolute sterility associated with hysterectomy was also discussed. Patient's questions were answered and she is ready to proceed with surgery. 2. Mammography June 2014. Continue with annual mammography. SBE monthly reviewed. 3. Pap smear June 2012. No Pap smear done today. No history of significant abnormal Pap smears previously. The issue of Pap smears after hysterectomy for benign indications was discussed and we'll readdress on an annual basis. 4. Health maintenance. Recent CBC normal. No other blood work done as it is all done through her primary physician's office. Followup for surgery as arranged.  Note: This document was prepared with digital dictation and possible smart phrase technology. Any transcriptional errors that result from this process are unintentional.   Dara Lords MD, 8:47 AM 08/14/2012

## 2012-08-14 NOTE — Patient Instructions (Signed)
Followup for surgery as scheduled. 

## 2012-08-14 NOTE — H&P (Signed)
Ruth Robertson February 09, 1965 [id number]   History and Physical  Chief complaint: Irregular heavy menses, leiomyoma.  History of present illness: 47 y.o. G1P1001 long history of heavy irregular bleeding. Multiple small myomas. Negative sonohysterogram with biopsy. Initially was planned for hysterectomy 2 years ago. Had Depo-Lupron preoperatively for hemoglobin recovery and decided afterwards to postpone surgery. Did well until this past year or so when she had recurrent heavy irregular menses with bleed through episodes requiring Megace to control. She did have an FSH which was 65. Not having overt menopausal symptoms. Recent ultrasound confirms multiple small myomas with endometrial echo 3 mm.  Options for management to include expectant management, Depo-Lupron suppression, hormonal manipulation, endometrial ablation, Mirena IUD, and hysterectomy were reviewed and the patient elects for hysterectomy and will be admitted for vaginal hysterectomy bilateral salpingo-oophorectomy.  Past medical history,surgical history, medications, allergies, family history and social history were all reviewed and documented in the EPIC chart. ROS:  Was performed and pertinent positives and negatives are included in the history of present illness.  Exam:  Chief Operating Officer: well developed, well nourished female, no acute distress HEENT: normal  Lungs: clear to auscultation without wheezing, rales or rhonchi  Cardiac: regular rate without rubs, murmurs or gallops  Abdomen: soft, nontender without masses, guarding, rebound, organomegaly  Pelvic: external bus vagina: normal   Cervix: grossly normal  Uterus: Mildly enlarged and irregular, midline and mobile, nontender  Adnexa: without masses or tenderness  Rectovaginal exam within normal limits     1. Assessment/Plan:  47 y.o. G1P1001 Multiple small myomas. Negative sonohysterogram with biopsy 2 years ago. Recent ultrasound confirms multiple small  myomas with endometrial echo 3 mm. Initially was planned for hysterectomy 2 years ago. Had Depo-Lupron preoperatively for hemoglobin recovery and decided afterwards to postpone surgery. Did well until this past year or so when she had recurrent heavy irregular menses with bleed through episodes requiring Megace to control. She did have an FSH which was 65. Not having overt menopausal symptoms. Options for management to include expectant management, Depo-Lupron suppression, hormonal manipulation, endometrial ablation, Mirena IUD, and hysterectomy were reviewed and the patient elects for hysterectomy and will be admitted for vaginal hysterectomy bilateral salpingo-oophorectomy. I reviewed with the patient the proposed surgery to include the expected intraoperative and postoperative courses and the recovery period. She understands that we will attempt a vaginal hysterectomy but at any time I may convert to a laparoscopic approach or a total abdominal hysterectomy with a larger incision and a longer recovery period. The ovarian conservation issue was discussed with her. She does have an elevated FSH and options for expectant management attempting to manage her symptoms through menopause versus proceeding with surgery now discussed as well as issue as to removing or keeping her ovaries reviewed. Potential benefit even in a perimenopausal patient for ovarian conservation from a cardiovascular and bone health standpoint reviewed. The patient very strongly wants both ovaries removed. She understands the risks of menopausal symptoms that may require HRT afterwards. The women's health initiative and risks of stroke heart attack DVT and breast cancer with HRT was reviewed with her. The acceleration of bone loss as well as accelerated cardiovascular risk also discussed and the issues of prophylactic treatment in a younger woman with estrogen discussed. We will address this postoperatively but regardless she wants both ovaries  removed even if this means a separate abdominal incision. The risks of the surgery were reviewed to include risk of infection requiring prolonged antibiotics, abscess or hematoma formation requiring  reoperation and drainage, incisional complications if incisions are made including opening and draining of incisions, closure by secondary intention, long-term issues of keloid/cosmetics and hernia formation reviewed. The risk of hemorrhage necessitating transfusion and the risks of transfusion discussed to include transfusion reaction, hepatitis, HIV, mad cow disease and other unknown entities reviewed. The risk of inadvertent injury to internal organs including bowel, bladder, ureters, vessels and nerves either immediately recognized or delay recognized requiring further reparative surgeries and future reparative surgeries including bowel resection, ostomy formation, ureteral damage repair, bladder repair was all discussed understood and accepted. Sexuality following hysterectomy to include persistent orgasmic dysfunction and persistent dyspareunia were reviewed. The absolute sterility associated with hysterectomy was also discussed. Patient's questions were answered and she is ready to proceed with surgery.    Note: This document was prepared with digital dictation and possible smart phrase technology. Any transcriptional errors that result from this process are unintentional.  Dara Lords MD, 9:44 AM 08/14/2012

## 2012-08-17 MED ORDER — DEXTROSE 5 % IV SOLN
2.0000 g | INTRAVENOUS | Status: AC
  Administered 2012-08-18: 2 g via INTRAVENOUS
  Filled 2012-08-17: qty 2

## 2012-08-18 ENCOUNTER — Ambulatory Visit (HOSPITAL_COMMUNITY)
Admission: RE | Admit: 2012-08-18 | Discharge: 2012-08-19 | Disposition: A | Discharge status: Home / Self Care | Payer: 59 | Source: Ambulatory Visit | Attending: Gynecology | Admitting: Gynecology

## 2012-08-18 ENCOUNTER — Encounter (HOSPITAL_COMMUNITY)
Admission: RE | Disposition: A | Discharge status: Home / Self Care | Payer: Self-pay | Source: Ambulatory Visit | Attending: Gynecology

## 2012-08-18 ENCOUNTER — Encounter (HOSPITAL_COMMUNITY): Payer: Self-pay

## 2012-08-18 ENCOUNTER — Ambulatory Visit (HOSPITAL_COMMUNITY): Payer: 59 | Admitting: Anesthesiology

## 2012-08-18 ENCOUNTER — Encounter (HOSPITAL_COMMUNITY): Payer: Self-pay | Admitting: Anesthesiology

## 2012-08-18 DIAGNOSIS — N926 Irregular menstruation, unspecified: Secondary | ICD-10-CM | POA: Insufficient documentation

## 2012-08-18 DIAGNOSIS — N83209 Unspecified ovarian cyst, unspecified side: Secondary | ICD-10-CM | POA: Insufficient documentation

## 2012-08-18 DIAGNOSIS — N72 Inflammatory disease of cervix uteri: Secondary | ICD-10-CM | POA: Insufficient documentation

## 2012-08-18 DIAGNOSIS — N920 Excessive and frequent menstruation with regular cycle: Secondary | ICD-10-CM

## 2012-08-18 DIAGNOSIS — N8300 Follicular cyst of ovary, unspecified side: Secondary | ICD-10-CM | POA: Insufficient documentation

## 2012-08-18 DIAGNOSIS — D251 Intramural leiomyoma of uterus: Secondary | ICD-10-CM | POA: Insufficient documentation

## 2012-08-18 DIAGNOSIS — Z23 Encounter for immunization: Secondary | ICD-10-CM | POA: Insufficient documentation

## 2012-08-18 DIAGNOSIS — D252 Subserosal leiomyoma of uterus: Secondary | ICD-10-CM | POA: Insufficient documentation

## 2012-08-18 DIAGNOSIS — D259 Leiomyoma of uterus, unspecified: Secondary | ICD-10-CM

## 2012-08-18 HISTORY — PX: VAGINAL HYSTERECTOMY: SHX2639

## 2012-08-18 HISTORY — PX: SALPINGOOPHORECTOMY: SHX82

## 2012-08-18 LAB — PREGNANCY, URINE: Preg Test, Ur: NEGATIVE

## 2012-08-18 SURGERY — HYSTERECTOMY, VAGINAL
Anesthesia: General | Site: Vagina | Wound class: Clean Contaminated

## 2012-08-18 MED ORDER — KETOROLAC TROMETHAMINE 30 MG/ML IJ SOLN: 15.0000 mg | Freq: Once | INTRAMUSCULAR | Status: DC | PRN

## 2012-08-18 MED ORDER — HYDROMORPHONE HCL PF 1 MG/ML IJ SOLN
0.2500 mg | INTRAMUSCULAR | Status: DC | PRN
  Administered 2012-08-18 (×2): 0.5 mg via INTRAVENOUS

## 2012-08-18 MED ORDER — BUPIVACAINE HCL (PF) 0.25 % IJ SOLN
INTRAMUSCULAR | Status: AC
  Filled 2012-08-18: qty 30

## 2012-08-18 MED ORDER — SIMETHICONE 80 MG PO CHEW: 80.0000 mg | CHEWABLE_TABLET | Freq: Four times a day (QID) | ORAL | Status: DC | PRN

## 2012-08-18 MED ORDER — PNEUMOCOCCAL VAC POLYVALENT 25 MCG/0.5ML IJ INJ
0.5000 mL | INJECTION | INTRAMUSCULAR | Status: AC
  Administered 2012-08-19: 0.5 mL via INTRAMUSCULAR
  Filled 2012-08-18: qty 0.5

## 2012-08-18 MED ORDER — DEXAMETHASONE SODIUM PHOSPHATE 10 MG/ML IJ SOLN
INTRAMUSCULAR | Status: DC | PRN
  Administered 2012-08-18: 10 mg via INTRAVENOUS

## 2012-08-18 MED ORDER — SERTRALINE HCL 100 MG PO TABS
100.0000 mg | ORAL_TABLET | Freq: Every day | ORAL | Status: DC
Start: 2012-08-18 — End: 2012-08-19
  Administered 2012-08-18: 100 mg via ORAL
  Filled 2012-08-18 (×2): qty 1

## 2012-08-18 MED ORDER — KETOROLAC TROMETHAMINE 30 MG/ML IJ SOLN
30.0000 mg | Freq: Four times a day (QID) | INTRAMUSCULAR | Status: DC
  Filled 2012-08-18: qty 1

## 2012-08-18 MED ORDER — HYDROMORPHONE HCL PF 1 MG/ML IJ SOLN
INTRAMUSCULAR | Status: AC
  Administered 2012-08-18: 0.5 mg via INTRAVENOUS
  Filled 2012-08-18: qty 1

## 2012-08-18 MED ORDER — ONDANSETRON HCL 4 MG/2ML IJ SOLN: 4.0000 mg | Freq: Four times a day (QID) | INTRAMUSCULAR | Status: DC | PRN

## 2012-08-18 MED ORDER — MIDAZOLAM HCL 5 MG/5ML IJ SOLN
INTRAMUSCULAR | Status: DC | PRN
  Administered 2012-08-18: 2 mg via INTRAVENOUS

## 2012-08-18 MED ORDER — KETOROLAC TROMETHAMINE 30 MG/ML IJ SOLN
30.0000 mg | Freq: Four times a day (QID) | INTRAMUSCULAR | Status: DC
  Administered 2012-08-18 – 2012-08-19 (×2): 30 mg via INTRAVENOUS
  Filled 2012-08-18: qty 1

## 2012-08-18 MED ORDER — MIDAZOLAM HCL 2 MG/2ML IJ SOLN
INTRAMUSCULAR | Status: AC
  Filled 2012-08-18: qty 2

## 2012-08-18 MED ORDER — PROPOFOL 10 MG/ML IV EMUL
INTRAVENOUS | Status: AC
  Filled 2012-08-18: qty 20

## 2012-08-18 MED ORDER — LIDOCAINE HCL (CARDIAC) 20 MG/ML IV SOLN
INTRAVENOUS | Status: DC | PRN
  Administered 2012-08-18: 50 mg via INTRAVENOUS

## 2012-08-18 MED ORDER — KETOROLAC TROMETHAMINE 30 MG/ML IJ SOLN
INTRAMUSCULAR | Status: DC | PRN
  Administered 2012-08-18: 30 mg via INTRAVENOUS

## 2012-08-18 MED ORDER — DEXTROSE-NACL 5-0.9 % IV SOLN
INTRAVENOUS | Status: DC
  Administered 2012-08-18: 19:00:00 via INTRAVENOUS

## 2012-08-18 MED ORDER — LACTATED RINGERS IV SOLN
INTRAVENOUS | Status: DC
  Administered 2012-08-18 (×3): via INTRAVENOUS

## 2012-08-18 MED ORDER — LIDOCAINE-EPINEPHRINE 1 %-1:100000 IJ SOLN
INTRAMUSCULAR | Status: DC | PRN
  Administered 2012-08-18: 7 mL

## 2012-08-18 MED ORDER — EPHEDRINE SULFATE 50 MG/ML IJ SOLN
INTRAMUSCULAR | Status: DC | PRN
  Administered 2012-08-18 (×2): 10 mg via INTRAVENOUS

## 2012-08-18 MED ORDER — ONDANSETRON HCL 4 MG PO TABS: 4.0000 mg | ORAL_TABLET | Freq: Four times a day (QID) | ORAL | Status: DC | PRN

## 2012-08-18 MED ORDER — EPHEDRINE 5 MG/ML INJ
INTRAVENOUS | Status: AC
  Filled 2012-08-18: qty 10

## 2012-08-18 MED ORDER — KETOROLAC TROMETHAMINE 30 MG/ML IJ SOLN
INTRAMUSCULAR | Status: AC
  Filled 2012-08-18: qty 1

## 2012-08-18 MED ORDER — MEPERIDINE HCL 25 MG/ML IJ SOLN: 6.2500 mg | INTRAMUSCULAR | Status: DC | PRN

## 2012-08-18 MED ORDER — METHYLENE BLUE 1 % INJ SOLN
INTRAMUSCULAR | Status: AC
  Filled 2012-08-18: qty 1

## 2012-08-18 MED ORDER — LIDOCAINE HCL (CARDIAC) 20 MG/ML IV SOLN
INTRAVENOUS | Status: AC
  Filled 2012-08-18: qty 5

## 2012-08-18 MED ORDER — MORPHINE SULFATE 4 MG/ML IJ SOLN: 1.0000 mg | INTRAMUSCULAR | Status: DC | PRN

## 2012-08-18 MED ORDER — MIDAZOLAM HCL 2 MG/2ML IJ SOLN: 0.5000 mg | Freq: Once | INTRAMUSCULAR | Status: DC | PRN

## 2012-08-18 MED ORDER — ONDANSETRON HCL 4 MG/2ML IJ SOLN
INTRAMUSCULAR | Status: DC | PRN
  Administered 2012-08-18: 4 mg via INTRAVENOUS

## 2012-08-18 MED ORDER — PROPOFOL 10 MG/ML IV BOLUS
INTRAVENOUS | Status: DC | PRN
  Administered 2012-08-18: 170 mg via INTRAVENOUS

## 2012-08-18 MED ORDER — 0.9 % SODIUM CHLORIDE (POUR BTL) OPTIME
TOPICAL | Status: DC | PRN
  Administered 2012-08-18 (×2): 1000 mL

## 2012-08-18 MED ORDER — OXYCODONE-ACETAMINOPHEN 5-325 MG PO TABS
1.0000 | ORAL_TABLET | ORAL | Status: DC | PRN
  Administered 2012-08-19 (×3): 2 via ORAL
  Filled 2012-08-18 (×4): qty 2

## 2012-08-18 MED ORDER — FENTANYL CITRATE 0.05 MG/ML IJ SOLN
INTRAMUSCULAR | Status: DC | PRN
  Administered 2012-08-18: 100 ug via INTRAVENOUS
  Administered 2012-08-18: 50 ug via INTRAVENOUS
  Administered 2012-08-18: 25 ug via INTRAVENOUS
  Administered 2012-08-18: 50 ug via INTRAVENOUS

## 2012-08-18 MED ORDER — FENTANYL CITRATE 0.05 MG/ML IJ SOLN
INTRAMUSCULAR | Status: AC
  Filled 2012-08-18: qty 5

## 2012-08-18 MED ORDER — GLYCOPYRROLATE 0.2 MG/ML IJ SOLN
INTRAMUSCULAR | Status: DC | PRN
  Administered 2012-08-18: 0.2 mg via INTRAVENOUS

## 2012-08-18 MED ORDER — FENTANYL CITRATE 0.05 MG/ML IJ SOLN
INTRAMUSCULAR | Status: AC
Start: 2012-08-18 — End: 2012-08-18
  Filled 2012-08-18: qty 2

## 2012-08-18 MED ORDER — PROMETHAZINE HCL 25 MG/ML IJ SOLN: 6.2500 mg | INTRAMUSCULAR | Status: DC | PRN

## 2012-08-18 MED ORDER — DEXTROSE-NACL 5-0.9 % IV SOLN
INTRAVENOUS | Status: DC
  Administered 2012-08-19: 02:00:00 via INTRAVENOUS

## 2012-08-18 MED ORDER — DEXAMETHASONE SODIUM PHOSPHATE 10 MG/ML IJ SOLN
INTRAMUSCULAR | Status: AC
  Filled 2012-08-18: qty 1

## 2012-08-18 MED ORDER — DIPHENHYDRAMINE HCL 25 MG PO CAPS: 50.0000 mg | ORAL_CAPSULE | Freq: Four times a day (QID) | ORAL | Status: DC | PRN

## 2012-08-18 MED ORDER — ONDANSETRON HCL 4 MG/2ML IJ SOLN
INTRAMUSCULAR | Status: AC
  Filled 2012-08-18: qty 2

## 2012-08-18 SURGICAL SUPPLY — 49 items
ADH SKN CLS APL DERMABOND .7 (GAUZE/BANDAGES/DRESSINGS)
BAG SPEC RTRVL LRG 6X4 10 (ENDOMECHANICALS)
BARRIER ADHS 3X4 INTERCEED (GAUZE/BANDAGES/DRESSINGS) IMPLANT
BLADE SURG 15 STRL LF C SS BP (BLADE) ×3 IMPLANT
BLADE SURG 15 STRL SS (BLADE) ×4
BRR ADH 4X3 ABS CNTRL BYND (GAUZE/BANDAGES/DRESSINGS)
CABLE HIGH FREQUENCY MONO STRZ (ELECTRODE) IMPLANT
CANISTER SUCTION 2500CC (MISCELLANEOUS) ×4 IMPLANT
CATH ROBINSON RED A/P 16FR (CATHETERS) ×4 IMPLANT
CLOTH BEACON ORANGE TIMEOUT ST (SAFETY) ×4 IMPLANT
CONT PATH 16OZ SNAP LID 3702 (MISCELLANEOUS) IMPLANT
CONTAINER PREFILL 10% NBF 60ML (FORM) IMPLANT
DECANTER SPIKE VIAL GLASS SM (MISCELLANEOUS) ×2 IMPLANT
DERMABOND ADVANCED (GAUZE/BANDAGES/DRESSINGS)
DERMABOND ADVANCED .7 DNX12 (GAUZE/BANDAGES/DRESSINGS) IMPLANT
DRESSING TELFA 8X3 (GAUZE/BANDAGES/DRESSINGS) ×4 IMPLANT
GLOVE BIO SURGEON STRL SZ7.5 (GLOVE) ×4 IMPLANT
GLOVE BIOGEL PI IND STRL 6.5 (GLOVE) ×3 IMPLANT
GLOVE BIOGEL PI INDICATOR 6.5 (GLOVE) ×1
GOWN PREVENTION PLUS LG XLONG (DISPOSABLE) ×8 IMPLANT
GOWN STRL REIN XL XLG (GOWN DISPOSABLE) ×16 IMPLANT
NDL SPNL 18GX3.5 QUINCKE PK (NEEDLE) ×2 IMPLANT
NDL SPNL 22GX3.5 QUINCKE BK (NEEDLE) IMPLANT
NEEDLE HYPO 22GX1.5 SAFETY (NEEDLE) IMPLANT
NEEDLE MAYO .5 CIRCLE (NEEDLE) IMPLANT
NEEDLE SPNL 18GX3.5 QUINCKE PK (NEEDLE) ×4 IMPLANT
NEEDLE SPNL 22GX3.5 QUINCKE BK (NEEDLE) IMPLANT
NS IRRIG 1000ML POUR BTL (IV SOLUTION) ×4 IMPLANT
PACK LAPAROSCOPY BASIN (CUSTOM PROCEDURE TRAY) ×4 IMPLANT
PACK VAGINAL WOMENS (CUSTOM PROCEDURE TRAY) ×4 IMPLANT
PAD OB MATERNITY 4.3X12.25 (PERSONAL CARE ITEMS) ×4 IMPLANT
POUCH SPECIMEN RETRIEVAL 10MM (ENDOMECHANICALS) IMPLANT
PROTECTOR NERVE ULNAR (MISCELLANEOUS) ×4 IMPLANT
SCALPEL HARMONIC ACE (MISCELLANEOUS) IMPLANT
SET IRRIG TUBING LAPAROSCOPIC (IRRIGATION / IRRIGATOR) IMPLANT
SUT PLAIN 4 0 FS 2 27 (SUTURE) ×4 IMPLANT
SUT VIC AB 0 CT1 18XCR BRD8 (SUTURE) ×9 IMPLANT
SUT VIC AB 0 CT1 36 (SUTURE) ×4 IMPLANT
SUT VIC AB 0 CT1 8-18 (SUTURE) ×12
SUT VIC AB 2-0 SH 27 (SUTURE)
SUT VIC AB 2-0 SH 27XBRD (SUTURE) IMPLANT
SUT VICRYL 0 TIES 12 18 (SUTURE) ×4 IMPLANT
SUT VICRYL 0 UR6 27IN ABS (SUTURE) ×4 IMPLANT
TOWEL OR 17X24 6PK STRL BLUE (TOWEL DISPOSABLE) ×8 IMPLANT
TRAY FOLEY CATH 14FR (SET/KITS/TRAYS/PACK) ×4 IMPLANT
TROCAR XCEL NON-BLD 11X100MML (ENDOMECHANICALS) ×4 IMPLANT
TROCAR XCEL NON-BLD 5MMX100MML (ENDOMECHANICALS) ×4 IMPLANT
WARMER LAPAROSCOPE (MISCELLANEOUS) ×4 IMPLANT
WATER STERILE IRR 1000ML POUR (IV SOLUTION) ×4 IMPLANT

## 2012-08-18 NOTE — Anesthesia Preprocedure Evaluation (Signed)
Anesthesia Evaluation  Patient identified by MRN, date of birth, ID band Patient awake    Reviewed: Allergy & Precautions, H&P , Patient's Chart, lab work & pertinent test results, reviewed documented beta blocker date and time   History of Anesthesia Complications (+) PONV  Airway Mallampati: II TM Distance: >3 FB Neck ROM: full    Dental no notable dental hx.    Pulmonary neg pulmonary ROS, asthma ,  breath sounds clear to auscultation  Pulmonary exam normal       Cardiovascular Exercise Tolerance: Good negative cardio ROS  Rhythm:regular Rate:Normal     Neuro/Psych negative neurological ROS  negative psych ROS   GI/Hepatic negative GI ROS, Neg liver ROS,   Endo/Other  negative endocrine ROS  Renal/GU negative Renal ROS     Musculoskeletal   Abdominal   Peds  Hematology negative hematology ROS (+)   Anesthesia Other Findings PONV (postoperative nausea and vomiting)     Fibroid        GAD (generalized anxiety disorder)     Asthma   ONLY WITH BRONCHITIS    Reproductive/Obstetrics negative OB ROS                           Anesthesia Physical Anesthesia Plan  ASA: II  Anesthesia Plan: General ETT   Post-op Pain Management:    Induction:   Airway Management Planned:   Additional Equipment:   Intra-op Plan:   Post-operative Plan:   Informed Consent: I have reviewed the patients History and Physical, chart, labs and discussed the procedure including the risks, benefits and alternatives for the proposed anesthesia with the patient or authorized representative who has indicated his/her understanding and acceptance.   Dental Advisory Given  Plan Discussed with: CRNA and Surgeon  Anesthesia Plan Comments:         Anesthesia Quick Evaluation

## 2012-08-18 NOTE — Op Note (Signed)
Ruth Robertson 1966/01/08 [id number]   Post Operative Note   Date of surgery:  08/18/2012  Pre Op Dx:  Leiomyoma, menorrhagia  Post Op Dx:  Leiomyoma, menorrhagia  Procedure:  Total vaginal hysterectomy, bilateral salpingo-oophorectomy  Surgeon:  Dara Lords  Assistant:  Reynaldo Minium  Anesthesia:  General  EBL:  50 cc  Complications:  None  Specimen:  Uterus, right and left fallopian tubes, right and left ovaries clinical weight 170 g to pathology  Findings: EUA:  External view S. Vagina normal. Cervix normal. Uterus mildly enlarged midline mobile. Adnexa without masses   Operative:  Uterus mildly enlarged with multiple small leiomyoma. Right and left fallopian tube segments normal. Right and left ovaries grossly normal. Cul-de-sac normal to limited inspection  Procedure:  Patient was taken to the operating room, underwent general anesthesia, was placed in the dorsal lithotomy position, received a perineal/vaginal preparation with Betadine solution, an EUA was performed and the Foley catheter was placed in sterile technique. A time out was performed by the surgical team and the patient was draped in the usual fashion. The cervix was visualized with a weighted speculum, grasped with a tenaculum and the cervical mucosa was circumferentially injected using 1% lidocaine with 1-100,000 dilution epinephrine a total of 8 cc. The cervical mucosa was then circumferentially incised and the paracervical planes were sharply developed. The posterior cul-de-sac was then sharply entered without difficulty and a long weighted speculum was placed. The right and left uterosacral ligaments were identified clamped cut and ligated using 0 Vicryl suture and tagged for future reference. The anterior vesicouterine plane was sharply and bluntly developed and the uterus was progressively freed from its attachments to clamping cutting and ligating of the paracervical and parametrial tissues. The  uterine vessels were identified and ligated bilaterally. At this point due to the distortion of the anterior cul-de-sac from the leiomyoma and narrow pelvic arch it was decided to morcellate the uterus posteriorly to ultimately allow delivery of the uterus through the vagina and placing a finger into the anterior cul-de-sac the vesicouterine peritoneal fold was identified and incised without difficulty. The remaining uterine tissues were removed after clamping cutting and ligating of the uterine/ovarian pedicles bilaterally. The right infundibulopelvic ligament and vessels were then identified clamped cut and ligated using 0 Vicryl suture in a simple stitch followed by a suture ligature. A similar procedure was carried out on the other side. The ovaries, fallopian tube segments as well as the morcellated uterus was sent to pathology. The long weighted speculum was replaced with a shorter weighted speculum and the intestines were packed in the cul-de-sac using a tagged tail sponge and the posterior vaginal cuff was run from uterosacral ligament to uterosacral ligament using 0 Vicryl suture in a running interlocking stitch. The packing was removed, the cul-de-sac irrigated showing adequate hemostasis and the vagina was then closed anterior to posterior using 0 Vicryl suture in interrupted figure-of-eight stitch. The vagina was irrigated again showing adequate hemostasis, the speculum removed, the patient placed in the supine position, received intraoperative Toradol and was awakened without difficulty and the condition having tolerated procedure well. The patient had approximately 100 cc of clear yellow urine in her Foley catheter following the procedure with ongoing urine production in the recovery room.   Note: This document was prepared with digital dictation and possible smart phrase technology. Any transcriptional errors that result from this process are unintentional.  Dara Lords MD, 2:57 PM  08/18/2012

## 2012-08-18 NOTE — Transfer of Care (Signed)
Immediate Anesthesia Transfer of Care Note  Patient: Ruth Robertson [facility]  Procedure(s) Performed: Procedure(s): HYSTERECTOMY VAGINAL (N/A) SALPINGO OOPHORECTOMY (Bilateral)  Patient Location: PACU  Anesthesia Type:General  Level of Consciousness: awake  Airway & Oxygen Therapy: Patient Spontanous Breathing  Post-op Assessment: Report given to PACU RN  Post vital signs: stable  Filed Vitals:   08/18/12 1128  BP: 122/78  Pulse:   Temp:   Resp:     Complications: No apparent anesthesia complications

## 2012-08-18 NOTE — H&P (Signed)
  The patient was examined.  I reviewed the proposed surgery and consent form with the patient.  The dictated history and physical is current and accurate and all questions were answered. The patient is ready to proceed with surgery and has a realistic understanding and expectation for the outcome.   Dara Lords MD, 12:40 PM 08/18/2012

## 2012-08-18 NOTE — Anesthesia Postprocedure Evaluation (Signed)
Anesthesia Post Note  Patient: Ruth Robertson  Procedure(s) Performed: Procedure(s) (LRB): HYSTERECTOMY VAGINAL (N/A) SALPINGO OOPHORECTOMY (Bilateral)  Anesthesia type: General  Patient location: PACU  Post pain: Pain level controlled  Post assessment: Post-op Vital signs reviewed  Last Vitals:  Filed Vitals:   08/18/12 1128  BP: 122/78  Pulse:   Temp:   Resp:     Post vital signs: Reviewed  Level of consciousness: sedated  Complications: No apparent anesthesia complications

## 2012-08-19 ENCOUNTER — Encounter (HOSPITAL_COMMUNITY): Payer: Self-pay | Admitting: Gynecology

## 2012-08-19 LAB — CBC
HCT: 36.4 % (ref 36.0–46.0)
Hemoglobin: 12 g/dL (ref 12.0–15.0)
MCH: 30.3 pg (ref 26.0–34.0)
MCHC: 33 g/dL (ref 30.0–36.0)
MCV: 91.9 fL (ref 78.0–100.0)
Platelets: 224 10*3/uL (ref 150–400)
RBC: 3.96 MIL/uL (ref 3.87–5.11)
RDW: 13.5 % (ref 11.5–15.5)
WBC: 15.5 10*3/uL — ABNORMAL HIGH (ref 4.0–10.5)

## 2012-08-19 MED ORDER — OXYCODONE-ACETAMINOPHEN 5-325 MG PO TABS: 1.0000 | ORAL_TABLET | ORAL | Status: DC | PRN

## 2012-08-19 MED ORDER — IBUPROFEN 800 MG PO TABS: 800.0000 mg | ORAL_TABLET | Freq: Three times a day (TID) | ORAL | Status: DC | PRN

## 2012-08-19 NOTE — Discharge Summary (Signed)
Postoperative Instructions Hysterectomy ° °Dr. Exavior Kimmons and the nursing staff have discussed postoperative instructions with you.  If you have any questions please ask them before you leave the hospital, or call Dr Patina Spanier’s office at [phone].   ° °We would like to emphasize the following instructions: ° ° °  Call the office to make your follow-up appointment as recommended by Dr Hisae Decoursey (usually 2 weeks). ° °  You were given a prescription, or one was ordered for you at the pharmacy you designated.  Get that prescription filled and take the medication according to instructions. ° °  You may eat a regular diet, but slowly until you start having bowel movements. ° °  Drink plenty of water daily. ° °  Nothing in the vagina (intercourse, douching, objects of any kind) until released by Dr Briceson Broadwater. ° °  No driving for two weeks.  Wait to be cleared by Dr Hendry Speas at your first post op check.  Car rides (short) are ok after several days at home, as long as you are not having significant pain, but no traveling out of town. ° °  You may shower, but no baths.  Walking up and down stairs is ok.  No heavy lifting, prolonged standing, repeated bending or any “working out” until your first post op check. ° °  Rest frequently, listen to your body and do not push yourself and overdo it. ° °  Call if: ° °o Your pain medication does not seem strong enough. °o Worsening pain or abdominal bloating °o Persistent nausea or vomiting °o Difficulty with urination or bowel movements. °o Temperature of 101 degrees or higher. °o Bleeding heavier then staining (clots or period type flow). °o You have any questions or concerns. °

## 2012-08-19 NOTE — Progress Notes (Signed)
Pt received pneumonia vaccine at 0900 prior to D/C.

## 2012-08-19 NOTE — Progress Notes (Signed)
Ruth Robertson 1965-08-25 [id number]   1 Day Post-Op Procedure(s) (LRB): HYSTERECTOMY VAGINAL (N/A) SALPINGO OOPHORECTOMY (Bilateral)  Subjective: Patient reports no acute distress, pain severity reported a 3 on a scale of 0-10, yes taking PO, foley catheter out, no voiding, yes ambulating, no passing flatus  Objective: Vital signs in last 24 hours: Temp:  [97.5 F (36.4 C)-98.4 F (36.9 C)] 97.5 F (36.4 C) (07/16 0505) Pulse Rate:  [53-82] 71 (07/16 0505) Resp:  [13-22] 18 (07/16 0505) BP: (93-122)/(51-80) 96/70 mmHg (07/16 0505) SpO2:  [98 %-100 %] 98 % (07/16 0505)      EXAM General: awake, alert and no distress Resp: rhonchi clear to auscultation bilaterally Cardio: regular rate and rhythm, S1, S2 normal, no murmur, click, rub or gallop GI: soft, mild tender, bowel sounds active Lower Extremities: Without swelling or tenderness Vaginal Bleeding: Reported scant   Lab Results:   Recent Labs  08/19/12 0535  WBC 15.5*  HGB 12.0  HCT 36.4  PLT 224    Assessment: s/p Procedure(s): HYSTERECTOMY VAGINAL SALPINGO OOPHORECTOMY: progressing well, foley just dc'ed.  Urine output good.  Drinking, ambulating, good PO pain relief.  Hb 12  Plan: Discharge home today after voiding.  Precautions, instructions and follow up were discussed with the patient.  Prescriptions provided per AVS.  Patient to call the office to arrange a post-operative appointmant in 2 weeks.   Note: This document was prepared with digital dictation and possible smart phrase technology. Any transcriptional errors that result from this process are unintentional.  Dara Lords, MD 08/19/2012 7:16 AM

## 2012-08-19 NOTE — Discharge Summary (Signed)
  Ruth Robertson [birth year] [id number]   Discharge Summary  Date of Admission:  08/18/2012  Date of Discharge:  08/19/2012  Discharge Diagnosis:  Menorrhagia, leiomyoma  Procedure:  Procedure(s): HYSTERECTOMY VAGINAL SALPINGO OOPHORECTOMY  Pathology: Diagnosis Uterus, ovaries and fallopian tubes, with cervix - CERVIX: NABOTHIAN CYST AND SQUAMOUS METAPLASIA. - ENDOMETRIUM: SECRETORY. NO EVIDENCE OF HYPERPLASIA OR CARCINOMA. - MYOMETRIUM: LEIOMYOMATA. - UTERINE SEROSA: UNREMARKABLE. NO ENDOMETRIOSIS OR MALIGNANCY. - RIGHT AND LEFT OVARIES: BENIGN FOLLICULAR CYSTS. NO ENDOMETRIOSIS OR EVIDENCE OF MALIGNANCY. - RIGHT AND LEFT FALLOPIAN TUBES: UNREMARKABLE. NO ENDOMETRIOSIS OR MALIGNANCY.  Hospital Course:  The patient underwent an uncomplicated total vaginal hysterectomy, bilateral salpingo-oophorectomy 08/18/2012. Patient's postoperative course was uncomplicated and she was discharged on postoperative day #1 ambulating well, tolerating regular diet, voiding without difficulty, good pain relief on oral medication with a postoperative hemoglobin of 12. The patient received instructions for postoperative care and call precautions.  She received prescriptions per AVS and will be seen in the office 2 weeks following discharge.     Note: This document was prepared with digital dictation and possible smart phrase technology. Any transcriptional errors that result from this process are unintentional.   Dara Lords MD, 5:21 PM 08/19/2012

## 2012-09-01 ENCOUNTER — Telehealth: Payer: Self-pay

## 2012-09-01 ENCOUNTER — Ambulatory Visit (INDEPENDENT_AMBULATORY_CARE_PROVIDER_SITE_OTHER): Payer: 59 | Admitting: Gynecology

## 2012-09-01 ENCOUNTER — Encounter: Payer: Self-pay | Admitting: Gynecology

## 2012-09-01 DIAGNOSIS — Z9889 Other specified postprocedural states: Secondary | ICD-10-CM

## 2012-09-01 MED ORDER — ESTRADIOL 0.5 MG PO TABS: 0.5000 mg | ORAL_TABLET | Freq: Every day | ORAL | Status: DC

## 2012-09-01 NOTE — Telephone Encounter (Signed)
Patient said that you have extended the time she needs to be out of work for recovery from 4 weeks to 6 weeks.  The disability co. May contact us for records to support the extension.  I did not see it mentioned in note today.  Patient is requesting I write her employer a letter stating 6 weeks out of work. Ok?

## 2012-09-01 NOTE — Patient Instructions (Signed)
Start on estrogen pill daily. Followup in 2 weeks for your next postoperative visit.

## 2012-09-01 NOTE — Telephone Encounter (Signed)
OK to extend 6 weeks. She is having normal recovery but is very tired and that is the reason I improved the extension to 6 weeks. Obviously when I see her back in 2 weeks this may be modified for whatever reason.

## 2012-09-01 NOTE — Progress Notes (Signed)
Patient presents for her first two-week postop visit status post TVH BSO. She's done well with the exception of fatigue and headaches on and off. Had not been started on HRT and she is having hot flashes and sweats.  Exam was Berenice Bouton Abdomen soft nontender without masses guarding rebound organomegaly. Pelvic external BUS vagina with cuff intact. Bimanual without masses or tenderness.  Assessment and plan: Normal postop check at 2 weeks status post TVH BSO. Pathology showed leiomyoma. Discussed HRT with her.  I reviewed the whole issue of HRT with her to include the WHI study with increased risk of stroke, heart attack, DVT and breast cancer. The ACOG and NAMS statements for lowest dose for the shortest period of time reviewed. Transdermal versus oral first-pass effect benefit discussed. After lengthy discussion the patient does want a trial of HRT. She does not want a patch and understands the possible slight increased risk of thrombosis. We'll start with estradiol 0.5 mg daily. #30 with 6 refills. Followup in 2 weeks for next postoperative checkup. Slowly resume activities with the exception of continued pelvic rest.

## 2012-09-02 NOTE — Telephone Encounter (Signed)
Letter mailed to patient at her request.

## 2012-09-16 ENCOUNTER — Ambulatory Visit (INDEPENDENT_AMBULATORY_CARE_PROVIDER_SITE_OTHER): Payer: 59 | Admitting: Gynecology

## 2012-09-16 ENCOUNTER — Telehealth: Payer: Self-pay | Admitting: *Deleted

## 2012-09-16 ENCOUNTER — Encounter: Payer: Self-pay | Admitting: Gynecology

## 2012-09-16 DIAGNOSIS — Z09 Encounter for follow-up examination after completed treatment for conditions other than malignant neoplasm: Secondary | ICD-10-CM

## 2012-09-16 NOTE — Progress Notes (Signed)
Patient presents for her four-week postoperative visit status post TVH BSO. Has done well since reinitiating ERT with estradiol 0.5 mg. Is continued to have headaches but does note that she seemed to have headaches before surgery.  Exam was Berenice Bouton Abdomen soft nontender without masses guarding rebound organomegaly. External BUS vagina with cuff healing nicely, sutures still in place. Bimanual without masses or tenderness.  Assessment and plan: Normal postoperative check in 4 weeks status post TVH BSO. Continue with pelvic rest but otherwise increase activities. Followup in 2 weeks for final postop check. Recommend neurology evaluation for her headaches and will arrange for her to see Dr. Clarisse Gouge

## 2012-09-16 NOTE — Telephone Encounter (Signed)
appt on 10/01/12 @ 10:15 am notes faxed to his office. Pt informed with is.

## 2012-09-16 NOTE — Telephone Encounter (Signed)
Message copied by Aura Camps on Wed Sep 16, 2012  2:17 PM ------      Message from: Dara Lords      Created: Wed Sep 16, 2012 11:47 AM       Arrange neurology appointment with Dr. Amelia Jo reference persistent headaches. ------

## 2012-09-16 NOTE — Patient Instructions (Signed)
Follow up in 2 weeks for postoperative state

## 2012-09-17 ENCOUNTER — Telehealth: Payer: Self-pay

## 2012-09-17 NOTE — Telephone Encounter (Signed)
On her way out yesterday after visit with you patient told Debarah Crape to ask me to write her a letter for return to work on 10/06/12.  That will be seven weeks post op.  Patient had been planning on six weeks out of work with a return date of 09/30/12 and her disability forms and FMLA forms reflect that.  Just double checking with you that okay for me to write her letter for return to work.  Disability company will probably want to know reason for extension, etc.

## 2012-09-17 NOTE — Telephone Encounter (Signed)
It is ok by me.  She is going to see a neurologist, not urologist.

## 2012-09-17 NOTE — Telephone Encounter (Signed)
Op note and last office visit note faxed to disability co with letter stating out of work until 9.9.14 at patient's request.

## 2012-09-17 NOTE — Telephone Encounter (Signed)
Patient called me this afternoon and now asks me to write her out until 10/13/12. She said that the disability co. Closed her case because she had me put "4-8 weeks" on her disability and at 4 weeks they closed it.  She asked me to reopen it with 10/13/12 as her return to work date. She said she comes back to see you in two weeks and may have to see urologist and doesn't want them to close it again if needs to be out longer.  Ok with you?

## 2012-09-30 ENCOUNTER — Ambulatory Visit (INDEPENDENT_AMBULATORY_CARE_PROVIDER_SITE_OTHER): Payer: 59 | Admitting: Family Medicine

## 2012-09-30 ENCOUNTER — Ambulatory Visit: Payer: 59 | Admitting: Family Medicine

## 2012-09-30 ENCOUNTER — Ambulatory Visit (INDEPENDENT_AMBULATORY_CARE_PROVIDER_SITE_OTHER): Payer: 59 | Admitting: Gynecology

## 2012-09-30 ENCOUNTER — Encounter: Payer: Self-pay | Admitting: Gynecology

## 2012-09-30 ENCOUNTER — Encounter: Payer: Self-pay | Admitting: Family Medicine

## 2012-09-30 VITALS — BP 118/70 | Ht 66.0 in | Wt 136.8 lb

## 2012-09-30 DIAGNOSIS — F418 Other specified anxiety disorders: Secondary | ICD-10-CM

## 2012-09-30 DIAGNOSIS — F341 Dysthymic disorder: Secondary | ICD-10-CM

## 2012-09-30 DIAGNOSIS — Z9889 Other specified postprocedural states: Secondary | ICD-10-CM

## 2012-09-30 MED ORDER — BUPROPION HCL ER (SR) 150 MG PO TB12: 150.0000 mg | ORAL_TABLET | Freq: Two times a day (BID) | ORAL | Status: DC

## 2012-09-30 MED ORDER — ESTRADIOL 1 MG PO TABS: 1.0000 mg | ORAL_TABLET | Freq: Every day | ORAL | Status: DC

## 2012-09-30 NOTE — Progress Notes (Signed)
Patient presents for a six-week postoperative visit status post TVH BSO doing well. She had started back on estradiol 0.5 mg. She still notes some fuzzy thinking and hot flushes.  We previously discussed the risks benefits as outlined in my 09/01/2012 note. I again reviewed the risks to include increased risks of blood clots such as stroke heart attack DVT and possible breast cancer issues.  Exam with Selena Batten Assistant Abdomen soft nontender without masses guarding rebound organomegaly. Pelvic external BUS vagina normal cuff well-healed. Bimanual without masses or tenderness.  Assessment and plan: Normal postop check at 6 weeks status post TVH BSO. Still having some menopausal symptoms. Recommended increasing to estradiol 1 mg daily. Assuming she does well with this and she'll followup in July 2015 annual visit. She'll slowly resume all normal activities. Precautions as far as gentle intercourse reviewed.

## 2012-09-30 NOTE — Patient Instructions (Signed)
Increased to the higher estrogen dose as we discussed. Resume all normal activities with caution to resume intercourse gently. Followup July 2015 for annual exam unless other issues followup sooner

## 2012-09-30 NOTE — Progress Notes (Signed)
  Subjective:    Patient ID: Ruth Robertson, female    DOB: Feb 24, 1965, 47 y.o.   MRN: [id number]  HPI Here for f/u visit.  Patient states she is having memory loss and cannot focus. She believes it is related to the Zoloft and wants to see if she can be switched back to the Wellbutrin.   No other concerns.  Patient under a lot of stress. She recently had a hysterectomy ovaries were taken she's on a low-dose estrogen she states that she is planning on going back to work she unfortunately has a hostile work environment that she has to put up with.  Review of Systems See above. She states more being stressed she denies true panic attacks no chest pain shortness breath.    Objective:   Physical Exam  Lungs clear hearts regular pulse normal      Assessment & Plan:  Sleep survey-patient will do this should have her boyfriend's input as well she may need a sleep study she states her boyfriend tells her that she pauses with her breathing at night she doesn't know any of this. She does have some daytime fatigue.  After a lengthy discussion patient opts to go back on Wellbutrin if she has further problems he may need Elavil Xanax for sparing basis.

## 2012-10-01 ENCOUNTER — Ambulatory Visit: Payer: 59 | Admitting: Gynecology

## 2012-10-22 ENCOUNTER — Telehealth: Payer: Self-pay | Admitting: Family Medicine

## 2012-10-22 NOTE — Telephone Encounter (Signed)
Patient has left the [facility] Questionnaire form for review.  This form is on the chart.  Please call Patient. Thanks

## 2012-10-22 NOTE — Telephone Encounter (Signed)
Test does not show adequate symptoms, I don't rec sleep study unless her symptoms worsen. It was rated aas a low risk. Have pt do sleep log for next 2 weeks thewn turn in ( time she "unwinds", lays down, notes any difficulty falling asleep, time of awakening, quality of sleep, and daytime fatigue then turn that into our office for my review)

## 2012-10-23 NOTE — Telephone Encounter (Signed)
Notified patient test does not show adequate symptoms, don't rec sleep study unless her symptoms worsen. It was rated as a low risk. Have pt do sleep log for next 2 weeks thewn turn in ( time she "unwinds", lays down, notes any difficulty falling asleep, time of awakening, quality of sleep, and daytime fatigue then turn that into our office. Patient verbalized understanding.

## 2012-12-05 ENCOUNTER — Encounter: Payer: 59 | Attending: Family Medicine | Admitting: *Deleted

## 2012-12-05 VITALS — Ht 65.5 in | Wt 135.9 lb

## 2012-12-05 DIAGNOSIS — Z713 Dietary counseling and surveillance: Secondary | ICD-10-CM | POA: Insufficient documentation

## 2012-12-05 NOTE — Progress Notes (Signed)
Subjective:     Patient ID: Ruth Robertson, female   DOB: [birth year], 47 y.o.   MRN: [id number]  HPI   Review of Systems     Objective:   Physical Exam  Patient was seen on 12/05/2012 for the Weight Loss Class at the [facility]. The following learning objectives were met by the patient during this class:   Describe healthy choices in each food group  Describe portion size of foods  Use plate method for meal planning  Demonstrate how to read Nutrition Facts food label  Set realistic goals for weight loss, diet changes, and physical activity.

## 2012-12-10 ENCOUNTER — Other Ambulatory Visit: Payer: Self-pay

## 2012-12-24 ENCOUNTER — Encounter: Payer: Self-pay | Admitting: Family Medicine

## 2012-12-24 ENCOUNTER — Ambulatory Visit (INDEPENDENT_AMBULATORY_CARE_PROVIDER_SITE_OTHER): Payer: 59 | Admitting: Family Medicine

## 2012-12-24 VITALS — BP 112/80 | Ht 65.5 in | Wt 138.2 lb

## 2012-12-24 DIAGNOSIS — Z0000 Encounter for general adult medical examination without abnormal findings: Secondary | ICD-10-CM

## 2012-12-24 NOTE — Progress Notes (Signed)
  Subjective:    Patient ID: Ruth Robertson, female    DOB: 10-Jan-1966, 47 y.o.   MRN: [id number]  HPI Patient is here today for annual exam.   She would like to quit smoking.  Patient goes to an OBGYN for pelvic and breast exams.  Pt is concerned about urinary frequency for the past few days. She denies any burning when voiding.  The patient comes in today for a wellness visit.  A review of their health history was completed.  A review of medications was also completed. Any necessary refills were discussed. Sensible healthy diet was discussed. Importance of minimizing excessive salt and carbohydrates was also discussed. Safety was stressed including driving, activities at work and at home where applicable. Importance of regular physical activity for overall health was discussed. Preventative measures appropriate for age were discussed. Time was spent with the patient discussing any concerns they have about their well-being.      Review of Systems  Constitutional: Negative for activity change, appetite change and fatigue.  HENT: Negative for congestion, ear discharge and rhinorrhea.   Eyes: Negative for discharge.  Respiratory: Negative for cough, chest tightness and wheezing.   Cardiovascular: Negative for chest pain.  Gastrointestinal: Negative for vomiting and abdominal pain.  Genitourinary: Negative for frequency and difficulty urinating.  Musculoskeletal: Negative for neck pain.  Allergic/Immunologic: Negative for environmental allergies and food allergies.  Neurological: Negative for weakness and headaches.  Psychiatric/Behavioral: Negative for behavioral problems and agitation.       Objective:   Physical Exam  Constitutional: She is oriented to person, place, and time. She appears well-developed and well-nourished.  HENT:  Head: Normocephalic.  Right Ear: External ear normal.  Left Ear: External ear normal.  Eyes: Pupils are equal, round, and reactive to  light.  Neck: Normal range of motion. No thyromegaly present.  Cardiovascular: Normal rate, regular rhythm, normal heart sounds and intact distal pulses.   No murmur heard. Pulmonary/Chest: Effort normal and breath sounds normal. No respiratory distress. She has no wheezes.  Abdominal: Soft. Bowel sounds are normal. She exhibits no distension and no mass. There is no tenderness.  Musculoskeletal: Normal range of motion. She exhibits no edema and no tenderness.  Lymphadenopathy:    She has no cervical adenopathy.  Neurological: She is alert and oriented to person, place, and time. She exhibits normal muscle tone.  Skin: Skin is warm and dry.  Psychiatric: She has a normal mood and affect. Her behavior is normal.          Assessment & Plan:  #1 discussed at length the importance of quitting smoking she stated that she's gotten try inhaler she is working with the pharmacist at the hospital with [place] some smoking cessation program #2 patient was encouraged to eat healthy stay physically active stay at good weight. #3 rarely uses Xanax #4 patient will do some lab work #5 safety measures dietary all discussed. Patient gets her breast exam and mammogram elsewhere patient not due for colonoscopy for another 6 years.

## 2012-12-26 LAB — CBC WITH DIFFERENTIAL/PLATELET
Basophils Absolute: 0 10*3/uL (ref 0.0–0.1)
Basophils Relative: 0 % (ref 0–1)
Eosinophils Absolute: 0.3 10*3/uL (ref 0.0–0.7)
Eosinophils Relative: 6 % — ABNORMAL HIGH (ref 0–5)
HCT: 43.6 % (ref 36.0–46.0)
Hemoglobin: 14.5 g/dL (ref 12.0–15.0)
Lymphocytes Relative: 32 % (ref 12–46)
Lymphs Abs: 1.7 10*3/uL (ref 0.7–4.0)
MCH: 30.3 pg (ref 26.0–34.0)
MCHC: 33.3 g/dL (ref 30.0–36.0)
MCV: 91 fL (ref 78.0–100.0)
Monocytes Absolute: 0.5 10*3/uL (ref 0.1–1.0)
Monocytes Relative: 9 % (ref 3–12)
Neutro Abs: 2.7 10*3/uL (ref 1.7–7.7)
Neutrophils Relative %: 53 % (ref 43–77)
Platelets: 241 10*3/uL (ref 150–400)
RBC: 4.79 MIL/uL (ref 3.87–5.11)
RDW: 13.5 % (ref 11.5–15.5)
WBC: 5.2 10*3/uL (ref 4.0–10.5)

## 2012-12-26 LAB — LIPID PANEL
Cholesterol: 159 mg/dL (ref 0–200)
HDL: 58 mg/dL (ref >39)
LDL Cholesterol: 89 mg/dL (ref 0–99)
Total CHOL/HDL Ratio: 2.7 Ratio
Triglycerides: 60 mg/dL (ref <150)
VLDL: 12 mg/dL (ref 0–40)

## 2012-12-26 LAB — BASIC METABOLIC PANEL
BUN: 15 mg/dL (ref 6–23)
CO2: 28 mEq/L (ref 19–32)
Calcium: 9.1 mg/dL (ref 8.4–10.5)
Chloride: 106 mEq/L (ref 96–112)
Creat: 0.88 mg/dL (ref 0.50–1.10)
Glucose, Bld: 76 mg/dL (ref 70–99)
Potassium: 4.3 mEq/L (ref 3.5–5.3)
Sodium: 140 mEq/L (ref 135–145)

## 2012-12-27 ENCOUNTER — Encounter: Payer: Self-pay | Admitting: Family Medicine

## 2012-12-28 LAB — VITAMIN D 25 HYDROXY (VIT D DEFICIENCY, FRACTURES): Vit D, 25-Hydroxy: 36 ng/mL (ref 30–89)

## 2012-12-29 ENCOUNTER — Telehealth: Payer: Self-pay | Admitting: Family Medicine

## 2012-12-29 MED ORDER — ALPRAZOLAM 0.5 MG PO TABS: 0.2500 mg | ORAL_TABLET | ORAL | Status: DC | PRN

## 2012-12-29 NOTE — Telephone Encounter (Signed)
ALPRAZolam (XANAX) 0.5 MG tablet   Pt states she thought she had more of these but  Does need a refill please   Send to Outpatient Pharm

## 2012-12-29 NOTE — Telephone Encounter (Signed)
Rx printed and faxed to pharmacy. 

## 2012-12-29 NOTE — Telephone Encounter (Signed)
Okay x4 

## 2013-01-01 ENCOUNTER — Encounter: Payer: 59 | Admitting: Family Medicine

## 2013-01-06 ENCOUNTER — Other Ambulatory Visit: Payer: Self-pay | Admitting: *Deleted

## 2013-01-06 MED ORDER — ALPRAZOLAM 0.5 MG PO TABS: 0.5000 mg | ORAL_TABLET | Freq: Two times a day (BID) | ORAL | Status: DC | PRN

## 2013-01-06 MED ORDER — NICOTINE 10 MG IN INHA: 1.0000 | RESPIRATORY_TRACT | Status: DC | PRN

## 2013-04-28 ENCOUNTER — Ambulatory Visit (INDEPENDENT_AMBULATORY_CARE_PROVIDER_SITE_OTHER): Payer: 59 | Admitting: Family Medicine

## 2013-04-28 ENCOUNTER — Encounter: Payer: Self-pay | Admitting: Family Medicine

## 2013-04-28 VITALS — BP 122/82 | Temp 98.1°F | Ht 66.0 in | Wt 140.0 lb

## 2013-04-28 DIAGNOSIS — J309 Allergic rhinitis, unspecified: Secondary | ICD-10-CM

## 2013-04-28 MED ORDER — LORATADINE 10 MG PO TABS: 10.0000 mg | ORAL_TABLET | Freq: Every day | ORAL | Status: DC

## 2013-04-28 MED ORDER — FLUTICASONE PROPIONATE 50 MCG/ACT NA SUSP: 2.0000 | Freq: Every day | NASAL | Status: DC

## 2013-04-28 NOTE — Progress Notes (Signed)
   Subjective:    Patient ID: Ruth Robertson, female    DOB: 20-Sep-1965, 48 y.o.   MRN: [id number]  Sinus Problem This is a new problem. The current episode started more than 1 month ago. Associated symptoms include ear pain and sinus pressure. Pertinent negatives include no congestion, coughing or shortness of breath.   C/o ear pain/pressure. Almost constant at times, for almost 2 months, stuffy and runny nose, some clearing of throat No fever no sweats No wheezing Tried- sudafed OTC  Review of Systems  Constitutional: Negative for fever and activity change.  HENT: Positive for ear pain and sinus pressure. Negative for congestion and rhinorrhea.   Eyes: Negative for discharge.  Respiratory: Negative for cough, shortness of breath and wheezing.   Cardiovascular: Negative for chest pain.       Objective:   Physical Exam  Nursing note and vitals reviewed. Constitutional: She appears well-developed.  HENT:  Head: Normocephalic.  Nose: Nose normal.  Mouth/Throat: Oropharynx is clear and moist. No oropharyngeal exudate.  Neck: Neck supple.  Cardiovascular: Normal rate and normal heart sounds.   No murmur heard. Pulmonary/Chest: Effort normal and breath sounds normal. She has no wheezes.  Lymphadenopathy:    She has no cervical adenopathy.  Skin: Skin is warm and dry.          Assessment & Plan:  I suspect that this is mainly allergy. I recommend a combination of loratadine and Flonase. Do this over the next couple months. Warning signs were discussed if any other particular problems let us know. At this present moment I don't recommend antibiotics but certainly if needing so we can send it in

## 2013-06-01 ENCOUNTER — Ambulatory Visit (INDEPENDENT_AMBULATORY_CARE_PROVIDER_SITE_OTHER): Payer: 59 | Admitting: Family Medicine

## 2013-06-01 ENCOUNTER — Encounter: Payer: Self-pay | Admitting: Family Medicine

## 2013-06-01 VITALS — BP 116/60 | Ht 66.0 in | Wt 143.0 lb

## 2013-06-01 DIAGNOSIS — Z720 Tobacco use: Secondary | ICD-10-CM

## 2013-06-01 DIAGNOSIS — F418 Other specified anxiety disorders: Secondary | ICD-10-CM

## 2013-06-01 DIAGNOSIS — F341 Dysthymic disorder: Secondary | ICD-10-CM

## 2013-06-01 DIAGNOSIS — F17200 Nicotine dependence, unspecified, uncomplicated: Secondary | ICD-10-CM

## 2013-06-01 DIAGNOSIS — L989 Disorder of the skin and subcutaneous tissue, unspecified: Secondary | ICD-10-CM

## 2013-06-01 MED ORDER — BETAMETHASONE VALERATE 0.1 % EX OINT: 1.0000 "application " | TOPICAL_OINTMENT | Freq: Two times a day (BID) | CUTANEOUS | Status: AC

## 2013-06-01 NOTE — Progress Notes (Signed)
   Subjective:    Patient ID: Ruth Robertson, female    DOB: 18-Feb-1965, 48 y.o.   MRN: [id number]  HPIFollow up on depression. Taking wellbutrin 150mg  Bid. She states this is doing very well on her depression is under good control   Sore on back on head for the past two weeks. Painful to touch. She denies any type of tick bite.  She does smoke she states she's under a lot of stress at work. Uses Xanax occasionally.  Review of Systems She denies being depressed she states that's doing well she denies excessive thirst urination vomiting diarrhea bloody stools    Objective:   Physical Exam Lungs are clear heart is regular pulses normal She does have an excoriated area on the back of her scalp which looks like it could have been a bug bite it does not look infected currently       Assessment & Plan:  1. Depression with anxiety Overall under very good control with medication uses Xanax rarely. Takes Wellbutrin on regular basis. Followup again in one years time if stable sooner problems she has a small area on the back of the scalp that's excoriated it could have been a bug bite that caused this I recommend trying a steroid ointment twice a day over the next few weeks if it doesn't go away with that then referral to dermatology she will let us know  2. Skin lesion See above.  3. Tobacco abuse Patient counseled to quit smoking.

## 2013-07-01 ENCOUNTER — Other Ambulatory Visit: Payer: Self-pay

## 2013-07-01 DIAGNOSIS — Z1231 Encounter for screening mammogram for malignant neoplasm of breast: Secondary | ICD-10-CM

## 2013-08-02 ENCOUNTER — Ambulatory Visit
Admission: RE | Admit: 2013-08-02 | Discharge: 2013-08-02 | Disposition: A | Discharge status: Home / Self Care | Payer: 59 | Source: Ambulatory Visit

## 2013-08-02 DIAGNOSIS — Z1231 Encounter for screening mammogram for malignant neoplasm of breast: Secondary | ICD-10-CM

## 2013-08-10 ENCOUNTER — Ambulatory Visit: Payer: 59 | Admitting: Family Medicine

## 2013-08-27 ENCOUNTER — Encounter: Payer: 59 | Admitting: Gynecology

## 2013-10-13 DIAGNOSIS — Z0289 Encounter for other administrative examinations: Secondary | ICD-10-CM

## 2013-10-21 ENCOUNTER — Ambulatory Visit (INDEPENDENT_AMBULATORY_CARE_PROVIDER_SITE_OTHER): Payer: 59 | Admitting: Gynecology

## 2013-10-21 ENCOUNTER — Other Ambulatory Visit (HOSPITAL_COMMUNITY)
Admission: RE | Admit: 2013-10-21 | Discharge: 2013-10-21 | Disposition: A | Discharge status: Home / Self Care | Payer: 59 | Source: Ambulatory Visit | Attending: Gynecology | Admitting: Gynecology

## 2013-10-21 ENCOUNTER — Encounter: Payer: Self-pay | Admitting: Gynecology

## 2013-10-21 VITALS — BP 120/76 | Ht 65.0 in | Wt 139.0 lb

## 2013-10-21 DIAGNOSIS — Z01419 Encounter for gynecological examination (general) (routine) without abnormal findings: Secondary | ICD-10-CM

## 2013-10-21 LAB — CBC WITH DIFFERENTIAL/PLATELET
Basophils Absolute: 0.1 10*3/uL (ref 0.0–0.1)
Basophils Relative: 1 % (ref 0–1)
Eosinophils Absolute: 0.3 10*3/uL (ref 0.0–0.7)
Eosinophils Relative: 4 % (ref 0–5)
HCT: 45.3 % (ref 36.0–46.0)
Hemoglobin: 15.5 g/dL — ABNORMAL HIGH (ref 12.0–15.0)
Lymphocytes Relative: 26 % (ref 12–46)
Lymphs Abs: 2.1 10*3/uL (ref 0.7–4.0)
MCH: 31.3 pg (ref 26.0–34.0)
MCHC: 34.2 g/dL (ref 30.0–36.0)
MCV: 91.3 fL (ref 78.0–100.0)
Monocytes Absolute: 0.6 10*3/uL (ref 0.1–1.0)
Monocytes Relative: 8 % (ref 3–12)
Neutro Abs: 4.8 10*3/uL (ref 1.7–7.7)
Neutrophils Relative %: 61 % (ref 43–77)
Platelets: 277 10*3/uL (ref 150–400)
RBC: 4.96 MIL/uL (ref 3.87–5.11)
RDW: 13.7 % (ref 11.5–15.5)
WBC: 7.9 10*3/uL (ref 4.0–10.5)

## 2013-10-21 LAB — COMPREHENSIVE METABOLIC PANEL
ALT: 18 U/L (ref 0–35)
AST: 14 U/L (ref 0–37)
Albumin: 4.3 g/dL (ref 3.5–5.2)
Alkaline Phosphatase: 61 U/L (ref 39–117)
BUN: 18 mg/dL (ref 6–23)
CO2: 27 mEq/L (ref 19–32)
Calcium: 9.8 mg/dL (ref 8.4–10.5)
Chloride: 106 mEq/L (ref 96–112)
Creat: 1.1 mg/dL (ref 0.50–1.10)
Glucose, Bld: 88 mg/dL (ref 70–99)
Potassium: 3.9 mEq/L (ref 3.5–5.3)
Sodium: 142 mEq/L (ref 135–145)
Total Bilirubin: 0.3 mg/dL (ref 0.2–1.2)
Total Protein: 6.8 g/dL (ref 6.0–8.3)

## 2013-10-21 LAB — LIPID PANEL
Cholesterol: 181 mg/dL (ref 0–200)
HDL: 65 mg/dL (ref >39)
LDL Cholesterol: 96 mg/dL (ref 0–99)
Total CHOL/HDL Ratio: 2.8 Ratio
Triglycerides: 101 mg/dL (ref <150)
VLDL: 20 mg/dL (ref 0–40)

## 2013-10-21 NOTE — Progress Notes (Signed)
Ruth Robertson [birth year] [id number]        48 y.o.  G1P1001 for annual exam.  Doing well without complaints.  Past medical history,surgical history, problem list, medications, allergies, family history and social history were all reviewed and documented as reviewed in the EPIC chart.  ROS:  12 system ROS performed with pertinent positives and negatives included in the history, assessment and plan.   Additional significant findings :  none   Exam: Kim Counsellor Vitals:   10/21/13 1546  BP: 120/76  Height: 5\' 5"  (1.651 m)  Weight: 139 lb (63.05 kg)   General appearance:  Normal affect, orientation and appearance. Skin: Grossly normal HEENT: Without gross lesions.  No cervical or supraclavicular adenopathy. Thyroid normal.  Lungs:  Clear without wheezing, rales or rhonchi Cardiac: RR, without RMG Abdominal:  Soft, nontender, without masses, guarding, rebound, organomegaly or hernia Breasts:  Examined lying and sitting without masses, retractions, discharge or axillary adenopathy. Pelvic:  Ext/BUS/vagina normal. Pap of cuff done  Adnexa  Without masses or tenderness    Anus and perineum  Normal   Rectovaginal  Normal sphincter tone without palpated masses or tenderness.    Assessment/Plan:  48 y.o. G37P1001 female for annual exam .   1. Status post TVH BSO last year. Doing well without complaints. Initially started on ERT but stopped it because she is not good at taking pills. I reviewed the whole issue of HRT with her to include the WHI study with risks of stroke heart attack DVT and breast cancer. Potential benefit when started in the 40s from a cardiovascular and bone health standpoint. After a lengthy discussion the patient is not interested in taking ERT and declines using it. 2. Pap smear 2012. Pap of cuff done today. No history of abnormal Pap smears. Status post [facility] for benign indications.  Options to stop screening altogether per current screening guidelines  versus less frequent screening intervals reviewed. Will readdress on annual basis. 3. Mammography 07/2013. Continue with annual mammography. SBE monthly reviewed. 4. Health maintenance. Baseline CBC comprehensive metabolic panel lipid profile urinalysis ordered. Follow up in one year, sooner as needed.     Anastasio Auerbach MD, 4:10 PM 10/21/2013

## 2013-10-21 NOTE — Addendum Note (Signed)
Addended by: Nelva Nay on: 10/21/2013 04:19 PM   Modules accepted: Orders

## 2013-10-21 NOTE — Patient Instructions (Signed)
Follow up in one year for annual exam, sooner if any issues.  You may obtain a copy of any labs that were done today by logging onto MyChart as outlined in the instructions provided with your AVS (after visit summary). The office will not call with normal lab results but certainly if there are any significant abnormalities then we will contact you.   Health Maintenance, Female A healthy lifestyle and preventative care can promote health and wellness.  Maintain regular health, dental, and eye exams.  Eat a healthy diet. Foods like vegetables, fruits, whole grains, low-fat dairy products, and lean protein foods contain the nutrients you need without too many calories. Decrease your intake of foods high in solid fats, added sugars, and salt. Get information about a proper diet from your caregiver, if necessary.  Regular physical exercise is one of the most important things you can do for your health. Most adults should get at least 150 minutes of moderate-intensity exercise (any activity that increases your heart rate and causes you to sweat) each week. In addition, most adults need muscle-strengthening exercises on 2 or more days a week.   Maintain a healthy weight. The body mass index (BMI) is a screening tool to identify possible weight problems. It provides an estimate of body fat based on height and weight. Your caregiver can help determine your BMI, and can help you achieve or maintain a healthy weight. For adults 20 years and older:  A BMI below 18.5 is considered underweight.  A BMI of 18.5 to 24.9 is normal.  A BMI of 25 to 29.9 is considered overweight.  A BMI of 30 and above is considered obese.  Maintain normal blood lipids and cholesterol by exercising and minimizing your intake of saturated fat. Eat a balanced diet with plenty of fruits and vegetables. Blood tests for lipids and cholesterol should begin at age 57 and be repeated every 5 years. If your lipid or cholesterol levels  are high, you are over 50, or you are a high risk for heart disease, you may need your cholesterol levels checked more frequently.Ongoing high lipid and cholesterol levels should be treated with medicines if diet and exercise are not effective.  If you smoke, find out from your caregiver how to quit. If you do not use tobacco, do not start.  Lung cancer screening is recommended for adults aged 33 80 years who are at high risk for developing lung cancer because of a history of smoking. Yearly low-dose computed tomography (CT) is recommended for people who have at least a 30-pack-year history of smoking and are a current smoker or have quit within the past 15 years. A pack year of smoking is smoking an average of 1 pack of cigarettes a day for 1 year (for example: 1 pack a day for 30 years or 2 packs a day for 15 years). Yearly screening should continue until the smoker has stopped smoking for at least 15 years. Yearly screening should also be stopped for people who develop a health problem that would prevent them from having lung cancer treatment.  If you are pregnant, do not drink alcohol. If you are breastfeeding, be very cautious about drinking alcohol. If you are not pregnant and choose to drink alcohol, do not exceed 1 drink per day. One drink is considered to be 12 ounces (355 mL) of beer, 5 ounces (148 mL) of wine, or 1.5 ounces (44 mL) of liquor.  Avoid use of street drugs. Do not share needles  with anyone. Ask for help if you need support or instructions about stopping the use of drugs.  High blood pressure causes heart disease and increases the risk of stroke. Blood pressure should be checked at least every 1 to 2 years. Ongoing high blood pressure should be treated with medicines, if weight loss and exercise are not effective.  If you are 4 to 48 years old, ask your caregiver if you should take aspirin to prevent strokes.  Diabetes screening involves taking a blood sample to check your  fasting blood sugar level. This should be done once every 3 years, after age 74, if you are within normal weight and without risk factors for diabetes. Testing should be considered at a younger age or be carried out more frequently if you are overweight and have at least 1 risk factor for diabetes.  Breast cancer screening is essential preventative care for women. You should practice "breast self-awareness." This means understanding the normal appearance and feel of your breasts and may include breast self-examination. Any changes detected, no matter how small, should be reported to a caregiver. Women in their 71s and 30s should have a clinical breast exam (CBE) by a caregiver as part of a regular health exam every 1 to 3 years. After age 53, women should have a CBE every year. Starting at age 59, women should consider having a mammogram (breast X-ray) every year. Women who have a family history of breast cancer should talk to their caregiver about genetic screening. Women at a high risk of breast cancer should talk to their caregiver about having an MRI and a mammogram every year.  Breast cancer gene (BRCA)-related cancer risk assessment is recommended for women who have family members with BRCA-related cancers. BRCA-related cancers include breast, ovarian, tubal, and peritoneal cancers. Having family members with these cancers may be associated with an increased risk for harmful changes (mutations) in the breast cancer genes BRCA1 and BRCA2. Results of the assessment will determine the need for genetic counseling and BRCA1 and BRCA2 testing.  The Pap test is a screening test for cervical cancer. Women should have a Pap test starting at age 51. Between ages 11 and 57, Pap tests should be repeated every 2 years. Beginning at age 1, you should have a Pap test every 3 years as long as the past 3 Pap tests have been normal. If you had a hysterectomy for a problem that was not cancer or a condition that could  lead to cancer, then you no longer need Pap tests. If you are between ages 54 and 56, and you have had normal Pap tests going back 10 years, you no longer need Pap tests. If you have had past treatment for cervical cancer or a condition that could lead to cancer, you need Pap tests and screening for cancer for at least 20 years after your treatment. If Pap tests have been discontinued, risk factors (such as a new sexual partner) need to be reassessed to determine if screening should be resumed. Some women have medical problems that increase the chance of getting cervical cancer. In these cases, your caregiver may recommend more frequent screening and Pap tests.  The human papillomavirus (HPV) test is an additional test that may be used for cervical cancer screening. The HPV test looks for the virus that can cause the cell changes on the cervix. The cells collected during the Pap test can be tested for HPV. The HPV test could be used to screen women aged  30 years and older, and should be used in women of any age who have unclear Pap test results. After the age of 30, women should have HPV testing at the same frequency as a Pap test.  Colorectal cancer can be detected and often prevented. Most routine colorectal cancer screening begins at the age of 50 and continues through age 75. However, your caregiver may recommend screening at an earlier age if you have risk factors for colon cancer. On a yearly basis, your caregiver may provide home test kits to check for hidden blood in the stool. Use of a small camera at the end of a tube, to directly examine the colon (sigmoidoscopy or colonoscopy), can detect the earliest forms of colorectal cancer. Talk to your caregiver about this at age 50, when routine screening begins. Direct examination of the colon should be repeated every 5 to 10 years through age 75, unless early forms of pre-cancerous polyps or small growths are found.  Hepatitis C blood testing is  recommended for all people born from 1945 through 1965 and any individual with known risks for hepatitis C.  Practice safe sex. Use condoms and avoid high-risk sexual practices to reduce the spread of sexually transmitted infections (STIs). Sexually active women aged 25 and younger should be checked for Chlamydia, which is a common sexually transmitted infection. Older women with new or multiple partners should also be tested for Chlamydia. Testing for other STIs is recommended if you are sexually active and at increased risk.  Osteoporosis is a disease in which the bones lose minerals and strength with aging. This can result in serious bone fractures. The risk of osteoporosis can be identified using a bone density scan. Women ages 65 and over and women at risk for fractures or osteoporosis should discuss screening with their caregivers. Ask your caregiver whether you should be taking a calcium supplement or vitamin D to reduce the rate of osteoporosis.  Menopause can be associated with physical symptoms and risks. Hormone replacement therapy is available to decrease symptoms and risks. You should talk to your caregiver about whether hormone replacement therapy is right for you.  Use sunscreen. Apply sunscreen liberally and repeatedly throughout the day. You should seek shade when your shadow is shorter than you. Protect yourself by wearing long sleeves, pants, a wide-brimmed hat, and sunglasses year round, whenever you are outdoors.  Notify your caregiver of new moles or changes in moles, especially if there is a change in shape or color. Also notify your caregiver if a mole is larger than the size of a pencil eraser.  Stay current with your immunizations. Document Released: 08/06/2010 Document Revised: 05/18/2012 Document Reviewed: 08/06/2010 [employer] Patient Information 2014 [employer].   

## 2013-10-22 LAB — URINALYSIS W MICROSCOPIC + REFLEX CULTURE
Bilirubin Urine: NEGATIVE
Casts: NONE SEEN
Crystals: NONE SEEN
Glucose, UA: NEGATIVE mg/dL
Hgb urine dipstick: NEGATIVE
Ketones, ur: NEGATIVE mg/dL
Leukocytes, UA: NEGATIVE
Nitrite: NEGATIVE
Protein, ur: NEGATIVE mg/dL
Specific Gravity, Urine: 1.026 (ref 1.005–1.030)
Urobilinogen, UA: 0.2 mg/dL (ref 0.0–1.0)
pH: 6 (ref 5.0–8.0)

## 2013-10-25 LAB — CYTOLOGY - PAP

## 2013-11-19 ENCOUNTER — Other Ambulatory Visit: Payer: Self-pay

## 2013-12-06 ENCOUNTER — Encounter: Payer: Self-pay | Admitting: Gynecology

## 2013-12-16 ENCOUNTER — Ambulatory Visit (INDEPENDENT_AMBULATORY_CARE_PROVIDER_SITE_OTHER): Payer: 59 | Admitting: Family Medicine

## 2013-12-16 ENCOUNTER — Encounter: Payer: Self-pay | Admitting: Family Medicine

## 2013-12-16 VITALS — BP 112/80 | Ht 66.0 in | Wt 140.4 lb

## 2013-12-16 DIAGNOSIS — Z0000 Encounter for general adult medical examination without abnormal findings: Secondary | ICD-10-CM

## 2013-12-16 DIAGNOSIS — D509 Iron deficiency anemia, unspecified: Secondary | ICD-10-CM

## 2013-12-16 MED ORDER — BUPROPION HCL ER (XL) 150 MG PO TB24: 150.0000 mg | ORAL_TABLET | Freq: Every day | ORAL | Status: DC

## 2013-12-16 MED ORDER — ALPRAZOLAM 0.5 MG PO TABS: 0.5000 mg | ORAL_TABLET | Freq: Two times a day (BID) | ORAL | Status: DC | PRN

## 2013-12-16 NOTE — Progress Notes (Signed)
   Subjective:    Patient ID: Ruth Robertson, female    DOB: 10/21/1965, 48 y.o.   MRN: [id number]  HPI The patient comes in today for a wellness visit. The patient comes in today for a wellness visit.    A review of their health history was completed.  A review of medications was also completed.  Any needed refills; yes Xanax  Eating habits: overall fairly good eating habits healthy  Falls/  MVA accidents in past few months: has not had any falls or motor vehicle accidents  Regular exercise: does not exercise on a regular basis  Specialist pt sees on regular basis: sees her gynecologist for her female checkups yearly  Preventative health issues were discussed.   Additional concerns: patient is a smoker we talked at length about the importance of quitting smoking    A review of their health history was completed.  A review of medications was also completed.  Any needed refills; Xanax and Wellbutrin  Eating habits: good  Falls/  MVA accidents in past few months: none  Regular exercise: Sometimes  Specialist pt sees on regular basis: none  Preventative health issues were discussed.   Additional concerns: none   Review of Systems  Constitutional: Negative for activity change, appetite change and fatigue.  HENT: Negative for congestion.   Respiratory: Negative for shortness of breath, wheezing and stridor.   Cardiovascular: Negative for chest pain.  Gastrointestinal: Negative for abdominal pain.  Endocrine: Negative for polydipsia and polyphagia.  Genitourinary: Negative for frequency.  Neurological: Negative for weakness.  Psychiatric/Behavioral: Negative for confusion.       Objective:   Physical Exam  Constitutional: She appears well-nourished. No distress.  Cardiovascular: Normal rate, regular rhythm and normal heart sounds.   No murmur heard. Pulmonary/Chest: Effort normal and breath sounds normal. No respiratory distress.  Musculoskeletal:  She exhibits no edema.  Lymphadenopathy:    She has no cervical adenopathy.  Neurological: She is alert. She exhibits normal muscle tone.  Psychiatric: Her behavior is normal.  Vitals reviewed.         Assessment & Plan:  #1 patient encouraged quit smoking #2 increase exercise #3 healthy eating stressed #4 history of anemia feeling fatigue check CBC and ferritin #5 screening laboratory lipid and metabolic 7 #6 refills given #7 follow-up 6 months

## 2013-12-18 LAB — CBC WITH DIFFERENTIAL/PLATELET
Basophils Absolute: 0 10*3/uL (ref 0.0–0.1)
Basophils Relative: 0 % (ref 0–1)
Eosinophils Absolute: 0.4 10*3/uL (ref 0.0–0.7)
Eosinophils Relative: 5 % (ref 0–5)
HCT: 45 % (ref 36.0–46.0)
Hemoglobin: 14.9 g/dL (ref 12.0–15.0)
Lymphocytes Relative: 27 % (ref 12–46)
Lymphs Abs: 1.9 10*3/uL (ref 0.7–4.0)
MCH: 30.6 pg (ref 26.0–34.0)
MCHC: 33.1 g/dL (ref 30.0–36.0)
MCV: 92.4 fL (ref 78.0–100.0)
Monocytes Absolute: 0.6 10*3/uL (ref 0.1–1.0)
Monocytes Relative: 8 % (ref 3–12)
Neutro Abs: 4.3 10*3/uL (ref 1.7–7.7)
Neutrophils Relative %: 60 % (ref 43–77)
Platelets: 272 10*3/uL (ref 150–400)
RBC: 4.87 MIL/uL (ref 3.87–5.11)
RDW: 13.6 % (ref 11.5–15.5)
WBC: 7.2 10*3/uL (ref 4.0–10.5)

## 2013-12-18 LAB — FERRITIN: Ferritin: 30 ng/mL (ref 10–291)

## 2013-12-19 LAB — BASIC METABOLIC PANEL
BUN: 12 mg/dL (ref 6–23)
CO2: 24 mEq/L (ref 19–32)
Calcium: 9.2 mg/dL (ref 8.4–10.5)
Chloride: 107 mEq/L (ref 96–112)
Creat: 0.83 mg/dL (ref 0.50–1.10)
Glucose, Bld: 91 mg/dL (ref 70–99)
Potassium: 4.6 mEq/L (ref 3.5–5.3)
Sodium: 142 mEq/L (ref 135–145)

## 2013-12-19 LAB — LIPID PANEL
Cholesterol: 153 mg/dL (ref 0–200)
HDL: 61 mg/dL (ref >39)
LDL Cholesterol: 75 mg/dL (ref 0–99)
Total CHOL/HDL Ratio: 2.5 Ratio
Triglycerides: 83 mg/dL (ref <150)
VLDL: 17 mg/dL (ref 0–40)

## 2014-01-21 ENCOUNTER — Other Ambulatory Visit: Payer: Self-pay | Admitting: Neurosurgery

## 2014-01-21 DIAGNOSIS — M5412 Radiculopathy, cervical region: Secondary | ICD-10-CM

## 2014-03-29 ENCOUNTER — Ambulatory Visit (INDEPENDENT_AMBULATORY_CARE_PROVIDER_SITE_OTHER): Payer: 59 | Admitting: Family Medicine

## 2014-03-29 ENCOUNTER — Encounter: Payer: Self-pay | Admitting: Family Medicine

## 2014-03-29 VITALS — BP 122/82 | Temp 98.0°F | Ht 66.0 in | Wt 139.0 lb

## 2014-03-29 DIAGNOSIS — J019 Acute sinusitis, unspecified: Secondary | ICD-10-CM

## 2014-03-29 DIAGNOSIS — B9689 Other specified bacterial agents as the cause of diseases classified elsewhere: Secondary | ICD-10-CM

## 2014-03-29 MED ORDER — AMOXICILLIN-POT CLAVULANATE 875-125 MG PO TABS: 1.0000 | ORAL_TABLET | Freq: Two times a day (BID) | ORAL | Status: DC

## 2014-03-29 NOTE — Progress Notes (Signed)
   Subjective:    Patient ID: Ruth Robertson, female    DOB: [birth year], 49 y.o.   MRN: [id number]  Sinusitis This is a new problem. Episode onset: 3 weeks ago. Associated symptoms include coughing, ear pain, headaches, neck pain and sinus pressure. Treatments tried: sudafed. The treatment provided no relief.   She denies headaches she denies vomiting she denies blurred vision or double vision denies high fevers. This does not wake her up at night  Review of Systems  HENT: Positive for ear pain and sinus pressure.   Respiratory: Positive for cough.   Musculoskeletal: Positive for neck pain.  Neurological: Positive for headaches.   She does relate that the vast majority of her pain is in the sinus region she describes as a pressure feeling and sensation that seemingly is got worse over the past week and a half    Objective:   Physical Exam  She points toward the area around her eyes on the frontal maxillary areas the main region of the pain but it does radiate toward her ears she states occasionally she gets a pain in the back of the head but this is not common on exam she is nontender eardrums normal throat is normal neck no masses lungs are clear she does not appear toxic      Assessment & Plan:  It is hard to know that the sinus pain is directly from sinusitis or another pathology. I find no sign of meningitis or brain tumor. Recommend antibiotics for next 10 days. If not dramatically better consider referral to ENT. She is to give [place] feedback over the next 10 days how she is doing Patient encouraged quit smoking.

## 2014-04-08 ENCOUNTER — Encounter: Payer: Self-pay | Admitting: Family Medicine

## 2014-04-10 ENCOUNTER — Other Ambulatory Visit: Payer: Self-pay | Admitting: Family Medicine

## 2014-04-10 MED ORDER — AMOXICILLIN-POT CLAVULANATE 875-125 MG PO TABS: 1.0000 | ORAL_TABLET | Freq: Two times a day (BID) | ORAL | Status: AC

## 2014-06-27 ENCOUNTER — Other Ambulatory Visit: Payer: Self-pay

## 2014-06-27 DIAGNOSIS — Z1231 Encounter for screening mammogram for malignant neoplasm of breast: Secondary | ICD-10-CM

## 2014-08-05 ENCOUNTER — Ambulatory Visit: Payer: 59

## 2014-08-11 ENCOUNTER — Ambulatory Visit
Admission: RE | Admit: 2014-08-11 | Discharge: 2014-08-11 | Disposition: A | Discharge status: Home / Self Care | Payer: 59 | Source: Ambulatory Visit

## 2014-08-11 DIAGNOSIS — Z1231 Encounter for screening mammogram for malignant neoplasm of breast: Secondary | ICD-10-CM

## 2014-09-02 ENCOUNTER — Ambulatory Visit: Payer: 59 | Admitting: Nurse Practitioner

## 2014-09-27 ENCOUNTER — Other Ambulatory Visit: Payer: Self-pay | Admitting: *Deleted

## 2014-09-27 ENCOUNTER — Telehealth: Payer: Self-pay | Admitting: Family Medicine

## 2014-09-27 MED ORDER — ALPRAZOLAM 0.5 MG PO TABS: 0.5000 mg | ORAL_TABLET | Freq: Two times a day (BID) | ORAL | Status: DC | PRN

## 2014-09-27 NOTE — Telephone Encounter (Signed)
done

## 2014-09-27 NOTE — Telephone Encounter (Signed)
Pt is needing a refill on her alprazolam.   [facility]

## 2014-09-27 NOTE — Telephone Encounter (Signed)
It is fine to go ahead and give a refill of this medicine plus one additional refill. Encouraged patient to go ahead and schedule for a follow-up visit somewhere in September or early October

## 2014-11-17 ENCOUNTER — Ambulatory Visit (INDEPENDENT_AMBULATORY_CARE_PROVIDER_SITE_OTHER): Payer: 59 | Admitting: Family Medicine

## 2014-11-17 ENCOUNTER — Ambulatory Visit (HOSPITAL_COMMUNITY)
Admission: RE | Admit: 2014-11-17 | Discharge: 2014-11-17 | Disposition: A | Discharge status: Home / Self Care | Payer: 59 | Source: Ambulatory Visit | Attending: Family Medicine | Admitting: Family Medicine

## 2014-11-17 ENCOUNTER — Encounter: Payer: Self-pay | Admitting: Family Medicine

## 2014-11-17 VITALS — BP 118/76 | Temp 98.4°F | Ht 66.5 in | Wt 138.0 lb

## 2014-11-17 DIAGNOSIS — J189 Pneumonia, unspecified organism: Secondary | ICD-10-CM | POA: Insufficient documentation

## 2014-11-17 DIAGNOSIS — J218 Acute bronchiolitis due to other specified organisms: Secondary | ICD-10-CM

## 2014-11-17 DIAGNOSIS — R05 Cough: Secondary | ICD-10-CM | POA: Diagnosis present

## 2014-11-17 MED ORDER — AZITHROMYCIN 250 MG PO TABS: ORAL_TABLET | ORAL | Status: DC

## 2014-11-17 MED ORDER — TRETINOIN 0.05 % EX CREA: TOPICAL_CREAM | Freq: Every day | CUTANEOUS | Status: DC

## 2014-11-17 MED ORDER — ALBUTEROL SULFATE HFA 108 (90 BASE) MCG/ACT IN AERS: 2.0000 | INHALATION_SPRAY | Freq: Four times a day (QID) | RESPIRATORY_TRACT | Status: DC | PRN

## 2014-11-17 NOTE — Progress Notes (Signed)
   Subjective:    Patient ID: Ruth Robertson, female    DOB: Oct 26, 1965, 49 y.o.   MRN: [id number]  Cough This is a new problem. Episode onset: 2 weeks. Associated symptoms include nasal congestion, rhinorrhea and wheezing. Pertinent negatives include no chest pain, ear pain, fever or shortness of breath. Treatments tried: albuterol inhaler, dayquil.   Pt requesting refill on tretinoin cream 0.05%. Originally prescribed by dermatologist.    Review of Systems  Constitutional: Negative for fever and activity change.  HENT: Positive for congestion and rhinorrhea. Negative for ear pain.   Eyes: Negative for discharge.  Respiratory: Positive for cough and wheezing. Negative for shortness of breath.   Cardiovascular: Negative for chest pain.       Objective:   Physical Exam  Constitutional: She appears well-developed.  HENT:  Head: Normocephalic.  Nose: Nose normal.  Mouth/Throat: Oropharynx is clear and moist. No oropharyngeal exudate.  Neck: Neck supple.  Cardiovascular: Normal rate and normal heart sounds.   No murmur heard. Pulmonary/Chest: Effort normal. She has no wheezes. She has rales.  Right lower lobe pneumonia  Lymphadenopathy:    She has no cervical adenopathy.  Skin: Skin is warm and dry.  Nursing note and vitals reviewed.  Patient not respiratory distress       Assessment & Plan:  X-rays ordered Early pneumonia right side, warning signs were discussed with the patient Patient not toxic antibiotics prescribed Patient encouraged quit smoking for health reasons both current and future  Dermatologic cream prescribed per patient request

## 2014-11-18 NOTE — Addendum Note (Signed)
Addended by: Ofilia Neas R on: 11/18/2014 11:34 AM   Modules accepted: Orders

## 2014-11-24 ENCOUNTER — Ambulatory Visit (INDEPENDENT_AMBULATORY_CARE_PROVIDER_SITE_OTHER): Payer: 59 | Admitting: Gynecology

## 2014-11-24 ENCOUNTER — Encounter: Payer: Self-pay | Admitting: Gynecology

## 2014-11-24 VITALS — BP 116/74 | Ht 66.0 in | Wt 139.0 lb

## 2014-11-24 DIAGNOSIS — Z01419 Encounter for gynecological examination (general) (routine) without abnormal findings: Secondary | ICD-10-CM | POA: Diagnosis not present

## 2014-11-24 LAB — CBC WITH DIFFERENTIAL/PLATELET
Basophils Absolute: 0 10*3/uL (ref 0.0–0.1)
Basophils Relative: 0 % (ref 0–1)
Eosinophils Absolute: 0.3 10*3/uL (ref 0.0–0.7)
Eosinophils Relative: 4 % (ref 0–5)
HCT: 44.1 % (ref 36.0–46.0)
Hemoglobin: 14.7 g/dL (ref 12.0–15.0)
Lymphocytes Relative: 31 % (ref 12–46)
Lymphs Abs: 2.3 10*3/uL (ref 0.7–4.0)
MCH: 30.6 pg (ref 26.0–34.0)
MCHC: 33.3 g/dL (ref 30.0–36.0)
MCV: 91.7 fL (ref 78.0–100.0)
MPV: 9.9 fL (ref 8.6–12.4)
Monocytes Absolute: 0.7 10*3/uL (ref 0.1–1.0)
Monocytes Relative: 9 % (ref 3–12)
Neutro Abs: 4.1 10*3/uL (ref 1.7–7.7)
Neutrophils Relative %: 56 % (ref 43–77)
Platelets: 289 10*3/uL (ref 150–400)
RBC: 4.81 MIL/uL (ref 3.87–5.11)
RDW: 13 % (ref 11.5–15.5)
WBC: 7.3 10*3/uL (ref 4.0–10.5)

## 2014-11-24 LAB — COMPREHENSIVE METABOLIC PANEL
ALT: 14 U/L (ref 6–29)
AST: 14 U/L (ref 10–35)
Albumin: 3.9 g/dL (ref 3.6–5.1)
Alkaline Phosphatase: 58 U/L (ref 33–115)
BUN: 16 mg/dL (ref 7–25)
CO2: 21 mmol/L (ref 20–31)
Calcium: 9.1 mg/dL (ref 8.6–10.2)
Chloride: 108 mmol/L (ref 98–110)
Creat: 0.84 mg/dL (ref 0.50–1.10)
Glucose, Bld: 81 mg/dL (ref 65–99)
Potassium: 4.2 mmol/L (ref 3.5–5.3)
Sodium: 139 mmol/L (ref 135–146)
Total Bilirubin: 0.3 mg/dL (ref 0.2–1.2)
Total Protein: 6.8 g/dL (ref 6.1–8.1)

## 2014-11-24 LAB — LIPID PANEL
Cholesterol: 177 mg/dL (ref 125–200)
HDL: 54 mg/dL (ref >=46)
LDL Cholesterol: 99 mg/dL (ref <130)
Total CHOL/HDL Ratio: 3.3 Ratio (ref <=5.0)
Triglycerides: 118 mg/dL (ref <150)
VLDL: 24 mg/dL (ref <30)

## 2014-11-24 NOTE — Progress Notes (Signed)
Ruth Robertson 06-Jul-1965 [id number]        49 y.o.  G1P1001 for annual exam.  Doing well without complaints  Past medical history,surgical history, problem list, medications, allergies, family history and social history were all reviewed and documented as reviewed in the EPIC chart.  ROS:  Performed with pertinent positives and negatives included in the history, assessment and plan.   Additional significant findings :  none   Exam: Kim Counsellor Vitals:   11/24/14 1527  BP: 116/74  Height: 5\' 6"  (1.676 m)  Weight: 139 lb (63.05 kg)   General appearance:  Normal affect, orientation and appearance. Skin: Grossly normal HEENT: Without gross lesions.  No cervical or supraclavicular adenopathy. Thyroid normal.  Lungs:  Clear without wheezing, rales or rhonchi Cardiac: RR, without RMG Abdominal:  Soft, nontender, without masses, guarding, rebound, organomegaly or hernia Breasts:  Examined lying and sitting without masses, retractions, discharge or axillary adenopathy. Pelvic:  Ext/BUS/vagina normal  Adnexa  Without masses or tenderness    Anus and perineum  Normal   Rectovaginal  Normal sphincter tone without palpated masses or tenderness.    Assessment/Plan:  49 y.o. G40P1001 female for annual exam.   1. Status post TVH BSO 2014.  Doing well. Had initially started ERT but ultimately stop this and is not having issues such as hot flashes, night sweats, vaginal dryness. Patient prefers to stay off of HRT. Will follow up if any issues. 2. Pap smear of vaginal cuff 2015. No Pap smear done today. No history of significant abnormal Pap smears previously. 3. Mammography 08/2014. Continue with annual mammography. SBE monthly reviewed. 4. Colonoscopy 5-6 years ago with reported repeat interval 5 years. She is going to call beginning of next year to arrange. 5. Health maintenance. Baseline CBC, comprehensive metabolic panel, lipid profile, urinalysis ordered. Follow up in one  year, sooner as needed.   Anastasio Auerbach MD, 3:51 PM 11/24/2014

## 2014-11-24 NOTE — Patient Instructions (Signed)

## 2014-11-25 ENCOUNTER — Telehealth: Payer: Self-pay | Admitting: Family Medicine

## 2014-11-25 LAB — URINALYSIS W MICROSCOPIC + REFLEX CULTURE
Bacteria, UA: NONE SEEN [HPF]
Bilirubin Urine: NEGATIVE
Casts: NONE SEEN [LPF]
Glucose, UA: NEGATIVE
Hgb urine dipstick: NEGATIVE
Ketones, ur: NEGATIVE
Leukocytes, UA: NEGATIVE
Nitrite: NEGATIVE
Protein, ur: NEGATIVE
RBC / HPF: NONE SEEN RBC/HPF (ref <=2)
Specific Gravity, Urine: 1.024 (ref 1.001–1.035)
Yeast: NONE SEEN [HPF]
pH: 5 (ref 5.0–8.0)

## 2014-11-25 NOTE — Telephone Encounter (Signed)
Pt was given Rx for TRETINOIN 0.05% cream at her last OV here, there is no Dx to support it and it is requiring prior authorization Called pt to ask what the dermatologist treated her for, she states she's unsure but it's for her face, states she was seen by Dr. Allyson Sabal about 3 years ago Called Dr. Ledell Peoples office & spoke with a nurse that states they do not have any treatment history on this patient, she even checked the archived records  Please advise on Dx for cream so that I may complete the prior authorization form

## 2014-11-26 LAB — URINE CULTURE

## 2014-11-28 NOTE — Telephone Encounter (Signed)
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 If not approved by her insurance company on the second try I would recommend dermatology consultation and let them handle this.

## 2014-12-02 NOTE — Telephone Encounter (Signed)
Rx prior auth APPROVED for pt's tretinoin (RETIN-A) 0.05 % cream, expires 11/28/15 through her [employer], faxed approval to [place]

## 2014-12-22 ENCOUNTER — Ambulatory Visit (HOSPITAL_COMMUNITY)
Admission: RE | Admit: 2014-12-22 | Discharge: 2014-12-22 | Disposition: A | Discharge status: Home / Self Care | Payer: 59 | Source: Ambulatory Visit | Attending: Family Medicine | Admitting: Family Medicine

## 2014-12-22 DIAGNOSIS — J189 Pneumonia, unspecified organism: Secondary | ICD-10-CM | POA: Insufficient documentation

## 2014-12-22 DIAGNOSIS — R918 Other nonspecific abnormal finding of lung field: Secondary | ICD-10-CM | POA: Diagnosis not present

## 2014-12-23 ENCOUNTER — Other Ambulatory Visit: Payer: Self-pay

## 2014-12-23 DIAGNOSIS — J189 Pneumonia, unspecified organism: Secondary | ICD-10-CM

## 2015-01-18 ENCOUNTER — Ambulatory Visit (HOSPITAL_COMMUNITY)
Admission: RE | Admit: 2015-01-18 | Discharge: 2015-01-18 | Disposition: A | Discharge status: Home / Self Care | Payer: 59 | Source: Ambulatory Visit | Attending: Family Medicine | Admitting: Family Medicine

## 2015-01-18 DIAGNOSIS — J189 Pneumonia, unspecified organism: Secondary | ICD-10-CM | POA: Insufficient documentation

## 2015-02-18 ENCOUNTER — Emergency Department (HOSPITAL_COMMUNITY)
Admission: EM | Admit: 2015-02-18 | Discharge: 2015-02-18 | Disposition: A | Discharge status: Home / Self Care | Payer: 59 | Source: Home / Self Care | Attending: Family Medicine | Admitting: Family Medicine

## 2015-02-18 ENCOUNTER — Encounter (HOSPITAL_COMMUNITY): Payer: Self-pay | Admitting: Emergency Medicine

## 2015-02-18 DIAGNOSIS — J019 Acute sinusitis, unspecified: Secondary | ICD-10-CM | POA: Diagnosis not present

## 2015-02-18 DIAGNOSIS — J069 Acute upper respiratory infection, unspecified: Secondary | ICD-10-CM

## 2015-02-18 MED ORDER — PREDNISONE 20 MG PO TABS: ORAL_TABLET | ORAL | Status: DC

## 2015-02-18 NOTE — ED Notes (Addendum)
C/o cold sx onset x5 days associated w/HA, facial pressure, bilateral ear fullness, congestion, prod cough States she was treated for pneumonia last month Recent travel to [place] Smokes 0.5 PPD A&O x4... No acute distress.

## 2015-02-18 NOTE — ED Provider Notes (Signed)
CSN: [id number]     Arrival date & time 02/18/15  1426 History   First MD Initiated Contact with Patient 02/18/15 1628     Chief Complaint  Patient presents with  . URI   (Consider location/radiation/quality/duration/timing/severity/associated sxs/prior Treatment) HPI Comments: 50 year old female complaining of upper respiratory congestion, maxillary facial pain, nasal congestion, PND, years popping and headache. The symptoms began approximately 4 days ago. She has taken a couple doses of DayQuil and ibuprofen.   Past Medical History  Diagnosis Date  . GAD (generalized anxiety disorder)   . PONV (postoperative nausea and vomiting)   . Asthma     ONLY WITH BRONCHITIS  . Pneumonia    Past Surgical History  Procedure Laterality Date  . Hysteroscopy  2006  . Vaginal hysterectomy N/A 08/18/2012    Procedure: HYSTERECTOMY VAGINAL;  Surgeon: Anastasio Auerbach, MD;  Location: [facility];  Service: Gynecology;  Laterality: N/A;  . Salpingoophorectomy Bilateral 08/18/2012    Procedure: SALPINGO OOPHORECTOMY;  Surgeon: Anastasio Auerbach, MD;  Location: [facility];  Service: Gynecology;  Laterality: Bilateral;  . Vaginal hysterectomy      Vag.Hyst BSO   Family History  Problem Relation Age of Onset  . Heart failure Father   . Stroke Father   . Breast cancer Maternal Aunt     50's  . Cancer Maternal Aunt 64    colon cancer  . Diabetes Paternal Grandfather   . Hypertension Maternal Grandmother   . Colon cancer Maternal Aunt   . Heart attack Mother    Social History  Substance Use Topics  . Smoking status: Current Every Day Smoker -- 0.50 packs/day for 25 years    Types: Cigarettes  . Smokeless tobacco: Never Used  . Alcohol Use: 0.0 oz/week    0 Standard drinks or equivalent per week     Comment: occassionaly   OB History    Gravida Para Term Preterm AB TAB SAB Ectopic Multiple Living   1 1 1       1      Review of Systems  Constitutional: Positive for activity change. Negative for  fever, chills, diaphoresis, appetite change and fatigue.  HENT: Positive for congestion, postnasal drip and sore throat. Negative for facial swelling and rhinorrhea.   Eyes: Negative.   Respiratory: Negative.  Negative for shortness of breath and wheezing.        Rare cough only  Cardiovascular: Positive for chest pain.  Gastrointestinal: Negative.   Musculoskeletal: Negative.  Negative for neck pain and neck stiffness.  Skin: Negative for pallor and rash.  Neurological: Negative.     Allergies  Review of patient's allergies indicates no known allergies.  Home Medications   Prior to Admission medications   Medication Sig Start Date End Date Taking? Authorizing Provider  buPROPion (WELLBUTRIN XL) 150 MG 24 hr tablet Take 1 tablet (150 mg total) by mouth daily. 12/16/13  Yes Kathyrn Drown, MD  albuterol (PROVENTIL HFA;VENTOLIN HFA) 108 (90 BASE) MCG/ACT inhaler Inhale 2 puffs into the lungs every 6 (six) hours as needed for wheezing or shortness of breath. 11/17/14   Kathyrn Drown, MD  ALPRAZolam Duanne Moron) 0.5 MG tablet Take 1 tablet (0.5 mg total) by mouth 2 (two) times daily as needed for anxiety. 09/27/14   Kathyrn Drown, MD  predniSONE (DELTASONE) 20 MG tablet Take 3 tabs po on first day, 2 tabs second day, 2 tabs third day, 1 tab fourth day, 1 tab 5th day. Take with food. 02/18/15  Janne Napoleon, NP  tretinoin (RETIN-A) 0.05 % cream Apply topically at bedtime. 11/17/14   Kathyrn Drown, MD   Meds Ordered and Administered this Visit  Medications - No data to display  BP 129/86 mmHg  Pulse 82  Temp(Src) 99.4 F (37.4 C) (Oral)  Resp 20  SpO2 98%  LMP 06/12/2012 No data found.   Physical Exam  Constitutional: She is oriented to person, place, and time. She appears well-developed and well-nourished. No distress.  HENT:  Bilateral TMs are normal Oropharynx with minor erythema. No exudates. Maxillary discomfort when leaning forward.  Eyes: Conjunctivae and EOM are normal.   Neck: Normal range of motion. Neck supple.  Cardiovascular: Normal rate, regular rhythm and normal heart sounds.   Pulmonary/Chest: Effort normal and breath sounds normal. No respiratory distress. She has no wheezes. She has no rales.  Musculoskeletal: Normal range of motion. She exhibits no edema.  Lymphadenopathy:    She has no cervical adenopathy.  Neurological: She is alert and oriented to person, place, and time. She exhibits normal muscle tone. Coordination normal.  Skin: Skin is warm and dry. No rash noted.  Psychiatric: She has a normal mood and affect.  Nursing note and vitals reviewed.   ED Course  Procedures (including critical care time)  Labs Review Labs Reviewed - No data to display  Imaging Review No results found.   Visual Acuity Review  Right Eye Distance:   Left Eye Distance:   Bilateral Distance:    Right Eye Near:   Left Eye Near:    Bilateral Near:         MDM   1. URI (upper respiratory infection)   2. Acute rhinosinusitis     Upper Respiratory Infection, Adult For drainage recommend taking either Allegra, Zyrtec or Claritin. For nasal and maxillary congestion take Sudafed PE 10 mg every 4 hours Use saline nasal spray frequently and copiously Robitussin plain to help loosen secretions. Drink plenty of fluids and stay well-hydrated Prednisone taper dose to help with sinus inflammation. Take with food.   Janne Napoleon, NP 02/18/15 1701

## 2015-02-18 NOTE — Discharge Instructions (Signed)
Upper Respiratory Infection, Adult For drainage recommend taking either Allegra, Zyrtec or Claritin. For nasal and maxillary congestion take Sudafed PE 10 mg every 4 hours Use saline nasal spray frequently and copiously Robitussin plain to help loosen secretions. Drink plenty of fluids and stay well-hydrated Prednisone taper dose to help with sinus inflammation. Take with food. Most upper respiratory infections (URIs) are a viral infection of the air passages leading to the lungs. A URI affects the nose, throat, and upper air passages. The most common type of URI is nasopharyngitis and is typically referred to as "the common cold." URIs run their course and usually go away on their own. Most of the time, a URI does not require medical attention, but sometimes a bacterial infection in the upper airways can follow a viral infection. This is called a secondary infection. Sinus and middle ear infections are common types of secondary upper respiratory infections. Bacterial pneumonia can also complicate a URI. A URI can worsen asthma and chronic obstructive pulmonary disease (COPD). Sometimes, these complications can require emergency medical care and may be life threatening.  CAUSES Almost all URIs are caused by viruses. A virus is a type of germ and can spread from one person to another.  RISKS FACTORS You may be at risk for a URI if:   You smoke.   You have chronic heart or lung disease.  You have a weakened defense (immune) system.   You are very young or very old.   You have nasal allergies or asthma.  You work in crowded or poorly ventilated areas.  You work in health care facilities or schools. SIGNS AND SYMPTOMS  Symptoms typically develop 2-3 days after you come in contact with a cold virus. Most viral URIs last 7-10 days. However, viral URIs from the influenza virus (flu virus) can last 14-18 days and are typically more severe. Symptoms may include:   Runny or stuffy (congested)  nose.   Sneezing.   Cough.   Sore throat.   Headache.   Fatigue.   Fever.   Loss of appetite.   Pain in your forehead, behind your eyes, and over your cheekbones (sinus pain).  Muscle aches.  DIAGNOSIS  Your health care provider may diagnose a URI by:  Physical exam.  Tests to check that your symptoms are not due to another condition such as:  Strep throat.  Sinusitis.  Pneumonia.  Asthma. TREATMENT  A URI goes away on its own with time. It cannot be cured with medicines, but medicines may be prescribed or recommended to relieve symptoms. Medicines may help:  Reduce your fever.  Reduce your cough.  Relieve nasal congestion. HOME CARE INSTRUCTIONS   Take medicines only as directed by your health care provider.   Gargle warm saltwater or take cough drops to comfort your throat as directed by your health care provider.  Use a warm mist humidifier or inhale steam from a shower to increase air moisture. This may make it easier to breathe.  Drink enough fluid to keep your urine clear or pale yellow.   Eat soups and other clear broths and maintain good nutrition.   Rest as needed.   Return to work when your temperature has returned to normal or as your health care provider advises. You may need to stay home longer to avoid infecting others. You can also use a face mask and careful hand washing to prevent spread of the virus.  Increase the usage of your inhaler if you have asthma.  Do not use any tobacco products, including cigarettes, chewing tobacco, or electronic cigarettes. If you need help quitting, ask your health care provider. PREVENTION  The best way to protect yourself from getting a cold is to practice good hygiene.   Avoid oral or hand contact with people with cold symptoms.   Wash your hands often if contact occurs.  There is no clear evidence that vitamin C, vitamin E, echinacea, or exercise reduces the chance of developing a  cold. However, it is always recommended to get plenty of rest, exercise, and practice good nutrition.  SEEK MEDICAL CARE IF:   You are getting worse rather than better.   Your symptoms are not controlled by medicine.   You have chills.  You have worsening shortness of breath.  You have brown or red mucus.  You have yellow or brown nasal discharge.  You have pain in your face, especially when you bend forward.  You have a fever.  You have swollen neck glands.  You have pain while swallowing.  You have white areas in the back of your throat. SEEK IMMEDIATE MEDICAL CARE IF:   You have severe or persistent:  Headache.  Ear pain.  Sinus pain.  Chest pain.  You have chronic lung disease and any of the following:  Wheezing.  Prolonged cough.  Coughing up blood.  A change in your usual mucus.  You have a stiff neck.  You have changes in your:  Vision.  Hearing.  Thinking.  Mood. MAKE SURE YOU:   Understand these instructions.  Will watch your condition.  Will get help right away if you are not doing well or get worse.   This information is not intended to replace advice given to you by your health care provider. Make sure you discuss any questions you have with your health care provider.   Document Released: 07/17/2000 Document Revised: 06/07/2014 Document Reviewed: 04/28/2013 Elsevier Interactive Patient Education 2016 Elsevier Inc.  Sinus Rinse WHAT IS A SINUS RINSE? A sinus rinse is a simple home treatment that is used to rinse your sinuses with a sterile mixture of salt and water (saline solution). Sinuses are air-filled spaces in your skull behind the bones of your face and forehead that open into your nasal cavity. You will use the following:  Saline solution.  Neti pot or spray bottle. This releases the saline solution into your nose and through your sinuses. Neti pots and spray bottles can be purchased at [employer], a health  food store, or online. WHEN WOULD I DO A SINUS RINSE? A sinus rinse can help to clear mucus, dirt, dust, or pollen from the nasal cavity. You may do a sinus rinse when you have a cold, a virus, nasal allergy symptoms, a sinus infection, or stuffiness in the nose or sinuses. If you are considering a sinus rinse:  Ask your child's health care provider before performing a sinus rinse on your child.  Do not do a sinus rinse if you have had ear or nasal surgery, ear infection, or blocked ears. HOW DO I DO A SINUS RINSE?  Wash your hands.  Disinfect your device according to the directions provided and then dry it.  Use the solution that comes with your device or one that is sold separately in stores. Follow the mixing directions on the package.  Fill your device with the amount of saline solution as directed by the device instructions.  Stand over a sink and tilt your head sideways over  the sink.  Place the spout of the device in your upper nostril (the one closer to the ceiling).  Gently pour or squeeze the saline solution into the nasal cavity. The liquid should drain to the lower nostril if you are not overly congested.  Gently blow your nose. Blowing too hard may cause ear pain.  Repeat in the other nostril.  Clean and rinse your device with clean water and then air-dry it. ARE THERE RISKS OF A SINUS RINSE?  Sinus rinse is generally very safe and effective. However, there are a few risks, which include:   A burning sensation in the sinuses. This may happen if you do not make the saline solution as directed. Make sure to follow all directions when making the saline solution.  Infection from contaminated water. This is rare, but possible.  Nasal irritation.   This information is not intended to replace advice given to you by your health care provider. Make sure you discuss any questions you have with your health care provider.   Document Released: 08/18/2013 Document Reviewed:  08/18/2013 Elsevier Interactive Patient Education 2016 [employer].  Sinusitis, Adult Sinusitis is redness, soreness, and puffiness (inflammation) of the air pockets in the bones of your face (sinuses). The redness, soreness, and puffiness can cause air and mucus to get trapped in your sinuses. This can allow germs to grow and cause an infection.  HOME CARE   Drink enough fluids to keep your pee (urine) clear or pale yellow.  Use a humidifier in your home.  Run a hot shower to create steam in the bathroom. Sit in the bathroom with the door closed. Breathe in the steam 3-4 times a day.  Put a warm, moist washcloth on your face 3-4 times a day, or as told by your doctor.  Use salt water sprays (saline sprays) to wet the thick fluid in your nose. This can help the sinuses drain.  Only take medicine as told by your doctor. GET HELP RIGHT AWAY IF:   Your pain gets worse.  You have very bad headaches.  You are sick to your stomach (nauseous).  You throw up (vomit).  You are very sleepy (drowsy) all the time.  Your face is puffy (swollen).  Your vision changes.  You have a stiff neck.  You have trouble breathing. MAKE SURE YOU:   Understand these instructions.  Will watch your condition.  Will get help right away if you are not doing well or get worse.   This information is not intended to replace advice given to you by your health care provider. Make sure you discuss any questions you have with your health care provider.   Document Released: 07/10/2007 Document Revised: 02/11/2014 Document Reviewed: 08/27/2011 Elsevier Interactive Patient Education [employer].

## 2015-02-23 ENCOUNTER — Encounter: Payer: Self-pay | Admitting: Family Medicine

## 2015-02-23 ENCOUNTER — Ambulatory Visit (INDEPENDENT_AMBULATORY_CARE_PROVIDER_SITE_OTHER): Payer: 59 | Admitting: Family Medicine

## 2015-02-23 VITALS — BP 114/76 | Temp 98.1°F | Ht 66.5 in | Wt 147.0 lb

## 2015-02-23 DIAGNOSIS — J019 Acute sinusitis, unspecified: Secondary | ICD-10-CM

## 2015-02-23 DIAGNOSIS — B9689 Other specified bacterial agents as the cause of diseases classified elsewhere: Secondary | ICD-10-CM

## 2015-02-23 MED ORDER — AMOXICILLIN-POT CLAVULANATE 875-125 MG PO TABS
1.0000 | ORAL_TABLET | Freq: Two times a day (BID) | ORAL | Status: DC
Start: 2015-02-23 — End: 2015-05-02

## 2015-02-23 MED FILL — AMOX TR-K CLV 875-125 MG TA: 875-125 | 10 days supply | Qty: 20 | Fill #0

## 2015-02-23 NOTE — Progress Notes (Signed)
   Subjective:    Patient ID: SABINE TUSS, female    DOB: 1965/11/17, 50 y.o.   MRN: [id number]  Sinusitis This is a new problem. The current episode started 1 to 4 weeks ago. The problem is unchanged. There has been no fever. Associated symptoms include ear pain, headaches and sinus pressure. Pertinent negatives include no congestion or coughing. Treatments tried: Prednisone. The treatment provided no relief.   Patient has concern of sore to center of chest. Patient relates sinus pressure pain discomfort she is a smoker she's been counseled to quit.  Review of Systems  Constitutional: Negative for activity change, appetite change and fatigue.  HENT: Positive for ear pain and sinus pressure. Negative for congestion.   Respiratory: Negative for cough.   Cardiovascular: Negative for chest pain.  Gastrointestinal: Negative for abdominal pain.  Endocrine: Negative for polydipsia and polyphagia.  Neurological: Positive for headaches. Negative for weakness.  Psychiatric/Behavioral: Negative for confusion.       Objective:   Physical Exam  Constitutional: She appears well-developed.  HENT:  Head: Normocephalic.  Nose: Nose normal.  Mouth/Throat: Oropharynx is clear and moist. No oropharyngeal exudate.  Neck: Neck supple.  Cardiovascular: Normal rate and normal heart sounds.   No murmur heard. Pulmonary/Chest: Effort normal and breath sounds normal. She has no wheezes.  Lymphadenopathy:    She has no cervical adenopathy.  Skin: Skin is warm and dry.  Nursing note and vitals reviewed.         Assessment & Plan:  Patient was seen today for upper respiratory illness. It is felt that the patient is dealing with sinusitis. Antibiotics were prescribed today. Importance of compliance with medication was discussed. Symptoms should gradually resolve over the course of the next several days. If high fevers, progressive illness, difficulty breathing, worsening condition or  failure for symptoms to improve over the next several days then the patient is to follow-up. If any emergent conditions the patient is to follow-up in the emergency department otherwise to follow-up in the office. Patient was told if she does not improve over the course of next 7-10 days with antibiotics using notify us and ostially follow-up if worse

## 2015-03-01 ENCOUNTER — Telehealth: Payer: Self-pay | Admitting: Family Medicine

## 2015-03-01 DIAGNOSIS — L989 Disorder of the skin and subcutaneous tissue, unspecified: Secondary | ICD-10-CM

## 2015-03-01 NOTE — Telephone Encounter (Signed)
Order for referral put in. Pt notified.  

## 2015-03-01 NOTE — Telephone Encounter (Signed)
Referral for spot on her chest that she has had for awhile that has not healed up. States you were going to refer her to dr hall or dr Allyson Sabal

## 2015-03-01 NOTE — Telephone Encounter (Signed)
Pt called stating that she is suppose to be referred to dermatology, but I didn't seen any referrals in the system. Please advise.

## 2015-03-01 NOTE — Telephone Encounter (Signed)
Seen 1/19 for URI.

## 2015-03-06 DIAGNOSIS — L570 Actinic keratosis: Secondary | ICD-10-CM | POA: Diagnosis not present

## 2015-03-06 DIAGNOSIS — X32XXXA Exposure to sunlight, initial encounter: Secondary | ICD-10-CM | POA: Diagnosis not present

## 2015-03-06 DIAGNOSIS — D225 Melanocytic nevi of trunk: Secondary | ICD-10-CM | POA: Diagnosis not present

## 2015-04-17 ENCOUNTER — Other Ambulatory Visit: Payer: Self-pay | Admitting: Family Medicine

## 2015-04-17 NOTE — Telephone Encounter (Signed)
May have this and one refill, needs office visit 30 day supply on the medication

## 2015-04-18 ENCOUNTER — Telehealth: Payer: Self-pay | Admitting: Family Medicine

## 2015-04-18 MED FILL — BUPROPION HCL XL 150 MG TAB: 150 | 30 days supply | Qty: 30 | Fill #0

## 2015-04-18 NOTE — Telephone Encounter (Signed)
buPROPion (WELLBUTRIN XL) 150 MG 24 hr tablet  Pt is out of this med as of this morning [facility] sent request Over yesterday with no response  Please refill and send to [facility]

## 2015-04-18 NOTE — Telephone Encounter (Signed)
Rx sent electronically to pharmacy. Patient notified. 

## 2015-05-02 ENCOUNTER — Encounter: Payer: Self-pay | Admitting: Family Medicine

## 2015-05-02 ENCOUNTER — Ambulatory Visit (INDEPENDENT_AMBULATORY_CARE_PROVIDER_SITE_OTHER): Payer: 59 | Admitting: Family Medicine

## 2015-05-02 VITALS — BP 110/74 | Ht 66.0 in | Wt 149.0 lb

## 2015-05-02 DIAGNOSIS — F418 Other specified anxiety disorders: Secondary | ICD-10-CM

## 2015-05-02 DIAGNOSIS — R635 Abnormal weight gain: Secondary | ICD-10-CM | POA: Diagnosis not present

## 2015-05-02 MED ORDER — ALPRAZOLAM 0.5 MG PO TABS: 0.5000 mg | ORAL_TABLET | Freq: Two times a day (BID) | ORAL | Status: DC | PRN

## 2015-05-02 MED ORDER — BUPROPION HCL ER (SR) 150 MG PO TB12: 150.0000 mg | ORAL_TABLET | Freq: Two times a day (BID) | ORAL | Status: DC

## 2015-05-02 NOTE — Progress Notes (Addendum)
   Subjective:    Patient ID: Ruth Robertson, female    DOB: August 16, 1965, 50 y.o.   MRN: [id number]  HPIMed check up on Depression. Taking wellbutrin bid.States her moods overall are doing very well she states she is tolerating medication well. She denies any problems  She uses Xanax infrequently would like an updated prescription  Pt has concerns about weight gain. She relates she is trying eat somewhat healthy but she does not track her calories she does admit to grabbing food on [place] in the evening time because of her busy schedule between work and school she is not exercising on a regular basis she does smoke she is been counseled to quit smoking she does not drink alcohol    Review of Systems Denies any chest tightness pressure pain shortness breath nausea vomiting    Objective:   Physical Exam Lungs are clear no crackles heart is regular pulse normal extremities no edema       Assessment & Plan:  Depression good control continue current measures Intermittent anxiety issues recommend Xanax when necessary cautioned drowsiness Patient was counseled to quit smoking Weight gain probably related to diet and lack of physical activity recommend the importance of regular exercise and try to lose weight through healthy eating.  I reviewed the patient's paper chart. She had a colonoscopy April 2010 had polyps but the polyp pathology did not show any cancer. They recommended another colonoscopy in 10 years. I would believe it would be reasonable for the patient to do colonoscopy when she turns 50 given the multiple polyps that were found. A letter will be sent to the patient regarding this.

## 2015-05-03 ENCOUNTER — Encounter: Payer: Self-pay | Admitting: Family Medicine

## 2015-05-03 LAB — TSH: TSH: 1.59 u[IU]/mL (ref 0.450–4.500)

## 2015-05-03 LAB — T4, FREE: Free T4: 1.09 ng/dL (ref 0.82–1.77)

## 2015-06-07 MED FILL — BUPROPION HCL XL 150 MG TAB: 150 | 30 days supply | Qty: 30 | Fill #1

## 2015-07-05 MED FILL — VALACYCLOVIR HCL 500 MG TAB: 500 | 2 days supply | Qty: 16 | Fill #0

## 2015-07-18 ENCOUNTER — Other Ambulatory Visit: Payer: Self-pay | Admitting: Family Medicine

## 2015-07-18 MED FILL — BUPROPION HCL XL 150 MG TAB: 150 | 30 days supply | Qty: 30 | Fill #0

## 2015-07-24 ENCOUNTER — Other Ambulatory Visit: Payer: Self-pay | Admitting: Gynecology

## 2015-07-24 DIAGNOSIS — Z1231 Encounter for screening mammogram for malignant neoplasm of breast: Secondary | ICD-10-CM

## 2015-09-06 ENCOUNTER — Emergency Department (HOSPITAL_COMMUNITY)
Admission: EM | Admit: 2015-09-06 | Discharge: 2015-09-06 | Disposition: A | Discharge status: Home / Self Care | Payer: 59 | Attending: Emergency Medicine | Admitting: Emergency Medicine

## 2015-09-06 ENCOUNTER — Encounter (HOSPITAL_COMMUNITY): Payer: Self-pay

## 2015-09-06 ENCOUNTER — Ambulatory Visit: Payer: 59 | Admitting: Family Medicine

## 2015-09-06 DIAGNOSIS — R1084 Generalized abdominal pain: Secondary | ICD-10-CM | POA: Diagnosis present

## 2015-09-06 DIAGNOSIS — J45909 Unspecified asthma, uncomplicated: Secondary | ICD-10-CM | POA: Insufficient documentation

## 2015-09-06 DIAGNOSIS — R112 Nausea with vomiting, unspecified: Secondary | ICD-10-CM | POA: Insufficient documentation

## 2015-09-06 DIAGNOSIS — F17210 Nicotine dependence, cigarettes, uncomplicated: Secondary | ICD-10-CM | POA: Diagnosis not present

## 2015-09-06 DIAGNOSIS — R197 Diarrhea, unspecified: Secondary | ICD-10-CM | POA: Insufficient documentation

## 2015-09-06 LAB — COMPREHENSIVE METABOLIC PANEL
ALT: 22 U/L (ref 14–54)
AST: 20 U/L (ref 15–41)
Albumin: 4.4 g/dL (ref 3.5–5.0)
Alkaline Phosphatase: 63 U/L (ref 38–126)
Anion gap: 12 (ref 5–15)
BUN: 10 mg/dL (ref 6–20)
CO2: 26 mmol/L (ref 22–32)
Calcium: 9.9 mg/dL (ref 8.9–10.3)
Chloride: 104 mmol/L (ref 101–111)
Creatinine, Ser: 0.9 mg/dL (ref 0.44–1.00)
GFR calc Af Amer: >60 mL/min (ref >60)
GFR calc non Af Amer: >60 mL/min (ref >60)
Glucose, Bld: 125 mg/dL — ABNORMAL HIGH (ref 65–99)
Potassium: 4.1 mmol/L (ref 3.5–5.1)
Sodium: 142 mmol/L (ref 135–145)
Total Bilirubin: 0.6 mg/dL (ref 0.3–1.2)
Total Protein: 7.5 g/dL (ref 6.5–8.1)

## 2015-09-06 LAB — CBC
HCT: 54.9 % — ABNORMAL HIGH (ref 36.0–46.0)
Hemoglobin: 18.2 g/dL — ABNORMAL HIGH (ref 12.0–15.0)
MCH: 31.8 pg (ref 26.0–34.0)
MCHC: 33.2 g/dL (ref 30.0–36.0)
MCV: 95.8 fL (ref 78.0–100.0)
Platelets: 287 10*3/uL (ref 150–400)
RBC: 5.73 MIL/uL — ABNORMAL HIGH (ref 3.87–5.11)
RDW: 13.3 % (ref 11.5–15.5)
WBC: 12.3 10*3/uL — ABNORMAL HIGH (ref 4.0–10.5)

## 2015-09-06 LAB — LIPASE, BLOOD: Lipase: 35 U/L (ref 11–51)

## 2015-09-06 MED ORDER — ONDANSETRON HCL 4 MG PO TABS: 4.0000 mg | ORAL_TABLET | Freq: Three times a day (TID) | ORAL | 0 refills | Status: DC | PRN

## 2015-09-06 MED ORDER — SODIUM CHLORIDE 0.9 % IV BOLUS (SEPSIS)
1000.0000 mL | Freq: Once | INTRAVENOUS | Status: AC
  Administered 2015-09-06: 1000 mL via INTRAVENOUS

## 2015-09-06 MED ORDER — DICYCLOMINE HCL 20 MG PO TABS: 20.0000 mg | ORAL_TABLET | Freq: Three times a day (TID) | ORAL | 0 refills | Status: DC | PRN

## 2015-09-06 MED ORDER — DICYCLOMINE HCL 10 MG PO CAPS
10.0000 mg | ORAL_CAPSULE | Freq: Once | ORAL | Status: AC
  Administered 2015-09-06: 10 mg via ORAL
  Filled 2015-09-06: qty 1

## 2015-09-06 MED ORDER — ONDANSETRON 4 MG PO TBDP
ORAL_TABLET | ORAL | Status: AC
  Filled 2015-09-06: qty 1

## 2015-09-06 MED ORDER — ONDANSETRON HCL 4 MG/2ML IJ SOLN
4.0000 mg | Freq: Once | INTRAMUSCULAR | Status: DC
  Filled 2015-09-06: qty 2

## 2015-09-06 MED ORDER — ONDANSETRON 4 MG PO TBDP
4.0000 mg | ORAL_TABLET | Freq: Once | ORAL | Status: AC | PRN
  Administered 2015-09-06: 4 mg via ORAL

## 2015-09-06 MED ORDER — KETOROLAC TROMETHAMINE 30 MG/ML IJ SOLN
30.0000 mg | Freq: Once | INTRAMUSCULAR | Status: AC
  Administered 2015-09-06: 30 mg via INTRAVENOUS
  Filled 2015-09-06: qty 1

## 2015-09-06 NOTE — ED Notes (Signed)
Pt c/o some continueing burning at stomach. PA made aware.

## 2015-09-06 NOTE — ED Notes (Signed)
Pt taking po fluids and tolerating well. 

## 2015-09-06 NOTE — ED Provider Notes (Signed)
[facility] DEPT Provider Note   CSN: [id number] Arrival date & time: 09/06/15  [id number]  First Provider Contact:  None       History   Chief Complaint Chief Complaint  Patient presents with  . Abdominal Pain    HPI Ruth Robertson is a 50 y.o. female.  HPI   Patient presents with diffuse abdominal pain and constant vomiting and diarrhea that began last night after eating out at a restaurant.  The symptoms began as she was driving home.  Has been having such profuse diarrhea she is now passing clear water.  Emesis has been yellow.  No blood in stool or emesis.  No fevers, CP, SOB, urinary symptoms.    Past Medical History:  Diagnosis Date  . Asthma    ONLY WITH BRONCHITIS  . GAD (generalized anxiety disorder)   . Pneumonia   . PONV (postoperative nausea and vomiting)     Patient Active Problem List   Diagnosis Date Noted  . Allergic rhinitis 04/28/2013  . Anemia 07/02/2012  . Depression with anxiety 03/26/2011    Past Surgical History:  Procedure Laterality Date  . HYSTEROSCOPY  2006  . SALPINGOOPHORECTOMY Bilateral 08/18/2012   Procedure: SALPINGO OOPHORECTOMY;  Surgeon: Anastasio Auerbach, MD;  Location: [facility];  Service: Gynecology;  Laterality: Bilateral;  . VAGINAL HYSTERECTOMY N/A 08/18/2012   Procedure: HYSTERECTOMY VAGINAL;  Surgeon: Anastasio Auerbach, MD;  Location: [facility];  Service: Gynecology;  Laterality: N/A;  . VAGINAL HYSTERECTOMY     Vag.Hyst BSO    OB History    Gravida Para Term Preterm AB Living   1 1 1     1    SAB TAB Ectopic Multiple Live Births                   Home Medications    Prior to Admission medications   Medication Sig Start Date End Date Taking? Authorizing Provider  albuterol (PROVENTIL HFA;VENTOLIN HFA) 108 (90 BASE) MCG/ACT inhaler Inhale 2 puffs into the lungs every 6 (six) hours as needed for wheezing or shortness of breath. 11/17/14  Yes Kathyrn Drown, MD  ALPRAZolam Duanne Moron) 0.5 MG tablet Take 1 tablet (0.5  mg total) by mouth 2 (two) times daily as needed for anxiety. 05/02/15  Yes Kathyrn Drown, MD  buPROPion (WELLBUTRIN SR) 150 MG 12 hr tablet Take 1 tablet (150 mg total) by mouth 2 (two) times daily. 05/02/15 11/17/18 Yes Scott A Luking, MD  buPROPion (WELLBUTRIN XL) 150 MG 24 hr tablet TAKE 1 TABLET BY MOUTH DAILY. Patient not taking: Reported on 09/06/2015 07/18/15   Mikey Kirschner, MD  tretinoin (RETIN-A) 0.05 % cream Apply topically at bedtime. Patient not taking: Reported on 09/06/2015 11/17/14   Kathyrn Drown, MD    Family History Family History  Problem Relation Age of Onset  . Heart failure Father   . Stroke Father   . Breast cancer Maternal Aunt     50's  . Cancer Maternal Aunt 57    colon cancer  . Diabetes Paternal Grandfather   . Hypertension Maternal Grandmother   . Colon cancer Maternal Aunt   . Heart attack Mother     Social History Social History  Substance Use Topics  . Smoking status: Current Every Day Smoker    Packs/day: 0.50    Years: 25.00    Types: Cigarettes  . Smokeless tobacco: Never Used  . Alcohol use 0.0 oz/week     Comment: occassionaly  Allergies   Review of patient's allergies indicates no known allergies.   Review of Systems Review of Systems  All other systems reviewed and are negative.    Physical Exam Updated Vital Signs BP (!) 130/104 (BP Location: Left Arm)   Pulse 96   Temp 98.2 F (36.8 C) (Oral)   Resp 20   Ht 5\' 6"  (1.676 m)   Wt 65.8 kg   LMP 06/12/2012   SpO2 99%   BMI 23.40 kg/m   Physical Exam  Constitutional: She appears well-developed and well-nourished. No distress.  HENT:  Head: Normocephalic and atraumatic.  Neck: Neck supple.  Cardiovascular: Normal rate and regular rhythm.   Pulmonary/Chest: Effort normal and breath sounds normal. No respiratory distress. She has no wheezes. She has no rales.  Abdominal: Soft. She exhibits no distension. There is tenderness (diffuse ). There is no rebound and no  guarding.  Neurological: She is alert.  Skin: She is not diaphoretic.  Nursing note and vitals reviewed.    ED Treatments / Results  Labs (all labs ordered are listed, but only abnormal results are displayed) Labs Reviewed  COMPREHENSIVE METABOLIC PANEL - Abnormal; Notable for the following:       Result Value   Glucose, Bld 125 (*)    All other components within normal limits  CBC - Abnormal; Notable for the following:    WBC 12.3 (*)    RBC 5.73 (*)    Hemoglobin 18.2 (*)    HCT 54.9 (*)    All other components within normal limits  LIPASE, BLOOD  URINALYSIS, ROUTINE W REFLEX MICROSCOPIC (NOT AT [facility])    EKG  EKG Interpretation None       Radiology No results found.  Procedures Procedures (including critical care time)  Medications Ordered in ED Medications  ondansetron (ZOFRAN-ODT) 4 MG disintegrating tablet (not administered)  sodium chloride 0.9 % bolus 1,000 mL (not administered)  ketorolac (TORADOL) 30 MG/ML injection 30 mg (not administered)  ondansetron (ZOFRAN) injection 4 mg (not administered)  ondansetron (ZOFRAN-ODT) disintegrating tablet 4 mg (4 mg Oral Given 09/06/15 1020)     Initial Impression / Assessment and Plan / ED Course  I have reviewed the triage vital signs and the nursing notes.  Pertinent labs & imaging results that were available during my care of the patient were reviewed by me and considered in my medical decision making (see chart for details).  Clinical Course  Comment By Time  Patient reports she is feeling much better.  Reexamination of abdomen is benign.   Clayton Bibles, PA-C 08/02 1425    Afebrile nontoxic patient with N/V/D and diffuse abdominal that began last night after eating out.  Clinically dehydrated on exam.  Nonfocal, nonsurgical abdominal exam.  IVF, medications given with improvement.  Reexamination of the abdomen benign. PO trial passed without difficulty.  D/C home with zofran, PCP follow up PRN.  Discussed  result, findings, treatment, and follow up  with patient.  Pt given return precautions.  Pt verbalizes understanding and agrees with plan.       Final Clinical Impressions(s) / ED Diagnoses   Final diagnoses:  Nausea vomiting and diarrhea    New Prescriptions Discharge Medication List as of 09/06/2015  2:28 PM    START taking these medications   Details  dicyclomine (BENTYL) 20 MG tablet Take 1 tablet (20 mg total) by mouth 3 (three) times daily as needed for spasms (abdominal cramping)., Starting Wed 09/06/2015, Print    ondansetron [facility])  4 MG tablet Take 1 tablet (4 mg total) by mouth every 8 (eight) hours as needed for nausea or vomiting., Starting Wed 09/06/2015, Print         Schneider, PA-C 09/06/15 1646    Sherwood Gambler, MD 09/07/15 [phone]

## 2015-09-06 NOTE — ED Triage Notes (Signed)
Pt reports sudden onset of abd burning, n/v/d since last night. Pt reports she ate a meal and sudden onset of pain. No vomiting in triage.

## 2015-09-06 NOTE — Discharge Instructions (Signed)
Read the information below.  Use the prescribed medication as directed.  Please discuss all new medications with your pharmacist.  You may return to the Emergency Department at any time for worsening condition or any new symptoms that concern you.   If you develop high fevers, worsening abdominal pain, uncontrolled vomiting, or are unable to tolerate fluids by mouth, return to the ER for a recheck.  ° °

## 2015-09-06 NOTE — ED Notes (Signed)
Pt State she understands instructions. Home stable with steady gait.

## 2015-09-07 ENCOUNTER — Encounter (HOSPITAL_COMMUNITY): Payer: Self-pay | Admitting: Emergency Medicine

## 2015-09-07 ENCOUNTER — Emergency Department (HOSPITAL_COMMUNITY): Payer: 59

## 2015-09-07 ENCOUNTER — Emergency Department (HOSPITAL_COMMUNITY)
Admission: EM | Admit: 2015-09-07 | Discharge: 2015-09-07 | Disposition: A | Discharge status: Home / Self Care | Payer: 59 | Source: Home / Self Care | Attending: Emergency Medicine | Admitting: Emergency Medicine

## 2015-09-07 ENCOUNTER — Observation Stay (HOSPITAL_COMMUNITY)
Admission: EM | Admit: 2015-09-07 | Discharge: 2015-09-10 | Disposition: A | Discharge status: Home / Self Care | Payer: 59 | Attending: Family Medicine | Admitting: Family Medicine

## 2015-09-07 DIAGNOSIS — R112 Nausea with vomiting, unspecified: Secondary | ICD-10-CM | POA: Insufficient documentation

## 2015-09-07 DIAGNOSIS — Z79899 Other long term (current) drug therapy: Secondary | ICD-10-CM | POA: Insufficient documentation

## 2015-09-07 DIAGNOSIS — R197 Diarrhea, unspecified: Secondary | ICD-10-CM

## 2015-09-07 DIAGNOSIS — A09 Infectious gastroenteritis and colitis, unspecified: Secondary | ICD-10-CM

## 2015-09-07 DIAGNOSIS — J45909 Unspecified asthma, uncomplicated: Secondary | ICD-10-CM | POA: Insufficient documentation

## 2015-09-07 DIAGNOSIS — F17210 Nicotine dependence, cigarettes, uncomplicated: Secondary | ICD-10-CM | POA: Insufficient documentation

## 2015-09-07 DIAGNOSIS — R1084 Generalized abdominal pain: Secondary | ICD-10-CM | POA: Diagnosis present

## 2015-09-07 DIAGNOSIS — F418 Other specified anxiety disorders: Secondary | ICD-10-CM | POA: Diagnosis present

## 2015-09-07 DIAGNOSIS — K6389 Other specified diseases of intestine: Secondary | ICD-10-CM | POA: Diagnosis not present

## 2015-09-07 LAB — COMPREHENSIVE METABOLIC PANEL
ALT: 17 U/L (ref 14–54)
ALT: 18 U/L (ref 14–54)
AST: 17 U/L (ref 15–41)
AST: 16 U/L (ref 15–41)
Albumin: 3.8 g/dL (ref 3.5–5.0)
Albumin: 3.8 g/dL (ref 3.5–5.0)
Alkaline Phosphatase: 55 U/L (ref 38–126)
Alkaline Phosphatase: 50 U/L (ref 38–126)
Anion gap: 8 (ref 5–15)
Anion gap: 4 — ABNORMAL LOW (ref 5–15)
BUN: 14 mg/dL (ref 6–20)
BUN: 14 mg/dL (ref 6–20)
CO2: 27 mmol/L (ref 22–32)
CO2: 28 mmol/L (ref 22–32)
Calcium: 9 mg/dL (ref 8.9–10.3)
Calcium: 8.5 mg/dL — ABNORMAL LOW (ref 8.9–10.3)
Chloride: 104 mmol/L (ref 101–111)
Chloride: 104 mmol/L (ref 101–111)
Creatinine, Ser: 1.13 mg/dL — ABNORMAL HIGH (ref 0.44–1.00)
Creatinine, Ser: 0.93 mg/dL (ref 0.44–1.00)
GFR calc Af Amer: >60 mL/min (ref >60)
GFR calc Af Amer: >60 mL/min (ref >60)
GFR calc non Af Amer: 56 mL/min — ABNORMAL LOW (ref >60)
GFR calc non Af Amer: >60 mL/min (ref >60)
Glucose, Bld: 126 mg/dL — ABNORMAL HIGH (ref 65–99)
Glucose, Bld: 142 mg/dL — ABNORMAL HIGH (ref 65–99)
Potassium: 3.9 mmol/L (ref 3.5–5.1)
Potassium: 3.6 mmol/L (ref 3.5–5.1)
Sodium: 139 mmol/L (ref 135–145)
Sodium: 136 mmol/L (ref 135–145)
Total Bilirubin: 0.7 mg/dL (ref 0.3–1.2)
Total Bilirubin: 0.5 mg/dL (ref 0.3–1.2)
Total Protein: 6.4 g/dL — ABNORMAL LOW (ref 6.5–8.1)
Total Protein: 6.7 g/dL (ref 6.5–8.1)

## 2015-09-07 LAB — URINALYSIS, ROUTINE W REFLEX MICROSCOPIC
Bilirubin Urine: NEGATIVE
Glucose, UA: NEGATIVE mg/dL
Glucose, UA: NEGATIVE mg/dL
Ketones, ur: NEGATIVE mg/dL
Ketones, ur: NEGATIVE mg/dL
Leukocytes, UA: NEGATIVE
Leukocytes, UA: NEGATIVE
Nitrite: NEGATIVE
Nitrite: NEGATIVE
Protein, ur: 30 mg/dL — AB
Protein, ur: NEGATIVE mg/dL
Specific Gravity, Urine: 1.019 (ref 1.005–1.030)
Specific Gravity, Urine: <1.005 — ABNORMAL LOW (ref 1.005–1.030)
pH: 6 (ref 5.0–8.0)
pH: 6.5 (ref 5.0–8.0)

## 2015-09-07 LAB — CBC
HCT: 52.3 % — ABNORMAL HIGH (ref 36.0–46.0)
HCT: 46.4 % — ABNORMAL HIGH (ref 36.0–46.0)
Hemoglobin: 17.2 g/dL — ABNORMAL HIGH (ref 12.0–15.0)
Hemoglobin: 15 g/dL (ref 12.0–15.0)
MCH: 31.2 pg (ref 26.0–34.0)
MCH: 30.5 pg (ref 26.0–34.0)
MCHC: 32.9 g/dL (ref 30.0–36.0)
MCHC: 32.3 g/dL (ref 30.0–36.0)
MCV: 94.9 fL (ref 78.0–100.0)
MCV: 94.3 fL (ref 78.0–100.0)
Platelets: 270 10*3/uL (ref 150–400)
Platelets: 236 10*3/uL (ref 150–400)
RBC: 5.51 MIL/uL — ABNORMAL HIGH (ref 3.87–5.11)
RBC: 4.92 MIL/uL (ref 3.87–5.11)
RDW: 13.5 % (ref 11.5–15.5)
RDW: 13.5 % (ref 11.5–15.5)
WBC: 9.1 10*3/uL (ref 4.0–10.5)
WBC: 7.2 10*3/uL (ref 4.0–10.5)

## 2015-09-07 LAB — URINE MICROSCOPIC-ADD ON

## 2015-09-07 LAB — LIPASE, BLOOD
Lipase: 163 U/L — ABNORMAL HIGH (ref 11–51)
Lipase: 39 U/L (ref 11–51)

## 2015-09-07 MED ORDER — DICYCLOMINE HCL 10 MG PO CAPS
20.0000 mg | ORAL_CAPSULE | Freq: Once | ORAL | Status: AC
  Administered 2015-09-07: 20 mg via ORAL
  Filled 2015-09-07: qty 2

## 2015-09-07 MED ORDER — ONDANSETRON HCL 4 MG/2ML IJ SOLN
4.0000 mg | Freq: Once | INTRAMUSCULAR | Status: AC
  Administered 2015-09-07: 4 mg via INTRAVENOUS
  Filled 2015-09-07: qty 2

## 2015-09-07 MED ORDER — ONDANSETRON 4 MG PO TBDP: 4.0000 mg | ORAL_TABLET | Freq: Once | ORAL | Status: DC

## 2015-09-07 MED ORDER — SODIUM CHLORIDE 0.9 % IV BOLUS (SEPSIS)
1000.0000 mL | Freq: Once | INTRAVENOUS | Status: AC
  Administered 2015-09-07: 1000 mL via INTRAVENOUS

## 2015-09-07 MED ORDER — ALPRAZOLAM 0.25 MG PO TABS
0.2500 mg | ORAL_TABLET | Freq: Once | ORAL | Status: AC
  Administered 2015-09-07: 0.25 mg via ORAL
  Filled 2015-09-07: qty 1

## 2015-09-07 MED ORDER — IOPAMIDOL (ISOVUE-300) INJECTION 61%
100.0000 mL | Freq: Once | INTRAVENOUS | Status: AC | PRN
  Administered 2015-09-07: 100 mL via INTRAVENOUS

## 2015-09-07 NOTE — ED Notes (Signed)
Pt states pain is worse with movement

## 2015-09-07 NOTE — Discharge Instructions (Signed)
You have been seen today for nausea, vomiting, and diarrhea. Your symptoms are consistent with a viral illness. Viruses do not require antibiotics. Treatment is symptomatic care. Drink plenty of fluids and get plenty of rest. You should be drinking at least a liter of water an hour to stay hydrated. Ibuprofen, Naproxen, or Tylenol for pain or fever. Bentyl for abdominal discomfort. Zofran for nausea. Zofran works best when taken before nausea and vomiting begins. Follow up with PCP as needed should symptoms continue. Return to ED should symptoms worsen.

## 2015-09-07 NOTE — ED Triage Notes (Signed)
Pt with abdominal pain since Tuesday. States she was seen at [facility] twice this week and given antibiotics and fluids. Pt states she cannot keep anything down and that she feels bloated.

## 2015-09-07 NOTE — ED Notes (Signed)
Family at bedside. 

## 2015-09-07 NOTE — ED Provider Notes (Signed)
[facility] DEPT Provider Note   CSN: [id number] Arrival date & time: 09/07/15  0303  First Provider Contact:  First MD Initiated Contact with Patient 09/07/15 [phone]        History   Chief Complaint Chief Complaint  Patient presents with  . Abdominal Pain    HPI Ruth Robertson is a 50 y.o. female.  HPI   Ruth Robertson is a 50 y.o. female, with a history of Asthma and anxiety, presenting to the ED with N/V/D and abdominal cramping for the last three days. Pt describes the pain as a burning, rates it mild to moderate, intermittent, nonradiating. Pt was seen yesterday for identical symptoms. Endorses one episode of vomiting since her discharge yesterday. Last oral intake was chicken soup yesterday evening. Patient was brought in by her parents because she was still having discomfort and vomited after her discharge.        Past Medical History:  Diagnosis Date  . Asthma    ONLY WITH BRONCHITIS  . GAD (generalized anxiety disorder)   . Pneumonia   . PONV (postoperative nausea and vomiting)     Patient Active Problem List   Diagnosis Date Noted  . Allergic rhinitis 04/28/2013  . Anemia 07/02/2012  . Depression with anxiety 03/26/2011    Past Surgical History:  Procedure Laterality Date  . HYSTEROSCOPY  2006  . SALPINGOOPHORECTOMY Bilateral 08/18/2012   Procedure: SALPINGO OOPHORECTOMY;  Surgeon: Anastasio Auerbach, MD;  Location: [facility];  Service: Gynecology;  Laterality: Bilateral;  . VAGINAL HYSTERECTOMY N/A 08/18/2012   Procedure: HYSTERECTOMY VAGINAL;  Surgeon: Anastasio Auerbach, MD;  Location: [facility];  Service: Gynecology;  Laterality: N/A;  . VAGINAL HYSTERECTOMY     Vag.Hyst BSO    OB History    Gravida Para Term Preterm AB Living   1 1 1     1    SAB TAB Ectopic Multiple Live Births                   Home Medications    Prior to Admission medications   Medication Sig Start Date End Date Taking? Authorizing Provider  albuterol  (PROVENTIL HFA;VENTOLIN HFA) 108 (90 BASE) MCG/ACT inhaler Inhale 2 puffs into the lungs every 6 (six) hours as needed for wheezing or shortness of breath. 11/17/14   Kathyrn Drown, MD  ALPRAZolam Duanne Moron) 0.5 MG tablet Take 1 tablet (0.5 mg total) by mouth 2 (two) times daily as needed for anxiety. 05/02/15   Kathyrn Drown, MD  buPROPion (WELLBUTRIN SR) 150 MG 12 hr tablet Take 1 tablet (150 mg total) by mouth 2 (two) times daily. 05/02/15 11/17/18  Kathyrn Drown, MD  buPROPion (WELLBUTRIN XL) 150 MG 24 hr tablet TAKE 1 TABLET BY MOUTH DAILY. Patient not taking: Reported on 09/06/2015 07/18/15   Mikey Kirschner, MD  dicyclomine (BENTYL) 20 MG tablet Take 1 tablet (20 mg total) by mouth 3 (three) times daily as needed for spasms (abdominal cramping). 09/06/15   Clayton Bibles, PA-C  ondansetron (ZOFRAN) 4 MG tablet Take 1 tablet (4 mg total) by mouth every 8 (eight) hours as needed for nausea or vomiting. 09/06/15   Clayton Bibles, PA-C  tretinoin (RETIN-A) 0.05 % cream Apply topically at bedtime. Patient not taking: Reported on 09/06/2015 11/17/14   Kathyrn Drown, MD    Family History Family History  Problem Relation Age of Onset  . Heart failure Father   . Stroke Father   . Breast cancer Maternal  Aunt     50's  . Cancer Maternal Aunt 45    colon cancer  . Diabetes Paternal Grandfather   . Hypertension Maternal Grandmother   . Colon cancer Maternal Aunt   . Heart attack Mother     Social History Social History  Substance Use Topics  . Smoking status: Current Every Day Smoker    Packs/day: 0.50    Years: 25.00    Types: Cigarettes  . Smokeless tobacco: Never Used  . Alcohol use 0.0 oz/week     Comment: occassionaly     Allergies   Review of patient's allergies indicates no known allergies.   Review of Systems Review of Systems  Constitutional: Negative for chills, diaphoresis and fever.  Respiratory: Negative for shortness of breath.   Cardiovascular: Negative for chest pain.    Gastrointestinal: Positive for abdominal pain, diarrhea, nausea and vomiting. Negative for abdominal distention and blood in stool.  Genitourinary: Negative for pelvic pain.  Musculoskeletal: Negative for back pain and neck stiffness.  Skin: Negative for color change and pallor.  Neurological: Negative for dizziness, syncope, light-headedness and headaches.  All other systems reviewed and are negative.    Physical Exam Updated Vital Signs BP 111/85   Pulse 65   Temp 98.2 F (36.8 C) (Oral)   Resp 18   Ht 5\' 6"  (1.676 m)   Wt 66 kg   LMP 06/12/2012   SpO2 99%   BMI 23.47 kg/m   Physical Exam  Constitutional: She appears well-developed and well-nourished. No distress.  HENT:  Head: Normocephalic and atraumatic.  Mouth/Throat: Oropharynx is clear and moist.  Eyes: Conjunctivae are normal.  Neck: Normal range of motion. Neck supple.  Cardiovascular: Normal rate, regular rhythm, normal heart sounds and intact distal pulses.   Pulmonary/Chest: Effort normal and breath sounds normal. No respiratory distress.  Abdominal: Soft. There is no tenderness. There is no guarding.  No discernible tenderness on exam based on patient's reaction. Patient verbally indicates generalized tenderness.  Musculoskeletal: She exhibits no edema.  Lymphadenopathy:    She has no cervical adenopathy.  Neurological: She is alert.  Skin: Skin is warm and dry. She is not diaphoretic.  Psychiatric: She has a normal mood and affect. Her behavior is normal.  Nursing note and vitals reviewed.    ED Treatments / Results  Labs (all labs ordered are listed, but only abnormal results are displayed) Labs Reviewed  LIPASE, BLOOD - Abnormal; Notable for the following:       Result Value   Lipase 163 (*)    All other components within normal limits  COMPREHENSIVE METABOLIC PANEL - Abnormal; Notable for the following:    Glucose, Bld 126 (*)    Creatinine, Ser 1.13 (*)    Total Protein 6.4 (*)    GFR calc  non Af Amer 56 (*)    All other components within normal limits  CBC - Abnormal; Notable for the following:    RBC 5.51 (*)    Hemoglobin 17.2 (*)    HCT 52.3 (*)    All other components within normal limits  URINALYSIS, ROUTINE W REFLEX MICROSCOPIC (NOT AT [facility]) - Abnormal; Notable for the following:    APPearance TURBID (*)    Hgb urine dipstick SMALL (*)    Bilirubin Urine SMALL (*)    Protein, ur 30 (*)    All other components within normal limits  URINE MICROSCOPIC-ADD ON - Abnormal; Notable for the following:    Squamous Epithelial / LPF  6-30 (*)    Bacteria, UA FEW (*)    All other components within normal limits    EKG  EKG Interpretation None       Radiology No results found.  Procedures Procedures (including critical care time)  Medications Ordered in ED Medications  dicyclomine (BENTYL) capsule 20 mg (20 mg Oral Given 09/07/15 0605)  ALPRAZolam Duanne Moron) tablet 0.25 mg (0.25 mg Oral Given 09/07/15 0605)     Initial Impression / Assessment and Plan / ED Course  I have reviewed the triage vital signs and the nursing notes.  Pertinent labs & imaging results that were available during my care of the patient were reviewed by me and considered in my medical decision making (see chart for details).  Clinical Course    [place] presents with nausea, vomiting, and diarrhea for the last 3 days.  Patient's symptoms are consistent with a viral infection. The symptoms and duration of such an infection was explained to the patient and her parents. Patient was reassured. Each of her lab results and vital signs were explained to the patient. Patient is nontoxic appearing, afebrile, not tachycardic, not tachypneic, not hypotensive, maintains SPO2 of 99% on room air, and is in no apparent distress. Patient has no signs of sepsis or other serious or life-threatening condition. The patient was given instructions for home care as well as return precautions. Patient  voices understanding of these instructions, accepts the plan, and is comfortable with discharge.  Patient was previously discharged with Zofran and Bentyl. Further instructions for use of these medications were explained in detail to the patient.  Vitals:   09/07/15 0307 09/07/15 0515  BP: 121/91 111/85  Pulse: 89 65  Resp: 17 18  Temp: 98.2 F (36.8 C)   TempSrc: Oral   SpO2: 99% 99%  Weight: 66 kg   Height: 5\' 6"  (1.676 m)      Final Clinical Impressions(s) / ED Diagnoses   Final diagnoses:  Nausea, vomiting, and diarrhea    New Prescriptions New Prescriptions   No medications on file     Lorayne Bender, PA-C 09/07/15 0617    Veryl Speak, MD 09/07/15 [phone]

## 2015-09-07 NOTE — ED Provider Notes (Signed)
[facility] DEPT Provider Note   CSN: [id number] Arrival date & time: 09/07/15  2111  First Provider Contact:  First MD Initiated Contact with Patient 09/07/15 2132     By signing my name below, I, Dora Sims, attest that this documentation has been prepared under the direction and in the presence of physician practitioner, Milton Ferguson, MD. Electronically Signed: Dora Sims, Scribe. 09/07/2015. 9:32 PM.   History   Chief Complaint Chief Complaint  Patient presents with  . Abdominal Pain    The history is provided by the patient. No language interpreter was used.  Abdominal Pain   This is a new problem. The current episode started 2 days ago. The problem occurs constantly. The problem has not changed since onset.Associated with: Nothing. The pain is located in the generalized abdominal region. The pain is at a severity of 5/10. The pain is severe. Associated symptoms include diarrhea and vomiting. Pertinent negatives include hematochezia, frequency, hematuria and headaches. The symptoms are aggravated by activity and certain positions. Nothing relieves the symptoms. Past workup does not include CT scan.     HPI Comments: Ruth Robertson is a 50 y.o. female who presents to the Emergency Department complaining of sudden onset, constant, severe, generalized abdominal pain ongoing for the last 2 days. She notes associated vomiting and diarrhea since onset as well. She endorses abdominal pain exacerbation with movement. She states she has not been able to keep food or fluids down since onset. Pt states she was seen at [facility] two days ago and this morning. Pt states she did not have a CT scan taken during either visit to [facility]. She denies recently using antibiotics. She notes a h/o vaginal hysterectomy and denies h/o abdominal surgery. She denies fever, chills, hematemesis, hematochezia, or any other associated symptoms.  PCP: Dr. Wolfgang Phoenix  Past Medical History:    Diagnosis Date  . Asthma    ONLY WITH BRONCHITIS  . GAD (generalized anxiety disorder)   . Pneumonia   . PONV (postoperative nausea and vomiting)     Patient Active Problem List   Diagnosis Date Noted  . Allergic rhinitis 04/28/2013  . Anemia 07/02/2012  . Depression with anxiety 03/26/2011    Past Surgical History:  Procedure Laterality Date  . HYSTEROSCOPY  2006  . SALPINGOOPHORECTOMY Bilateral 08/18/2012   Procedure: SALPINGO OOPHORECTOMY;  Surgeon: Anastasio Auerbach, MD;  Location: [facility];  Service: Gynecology;  Laterality: Bilateral;  . VAGINAL HYSTERECTOMY N/A 08/18/2012   Procedure: HYSTERECTOMY VAGINAL;  Surgeon: Anastasio Auerbach, MD;  Location: [facility];  Service: Gynecology;  Laterality: N/A;  . VAGINAL HYSTERECTOMY     Vag.Hyst BSO    OB History    Gravida Para Term Preterm AB Living   1 1 1     1    SAB TAB Ectopic Multiple Live Births                   Home Medications    Prior to Admission medications   Medication Sig Start Date End Date Taking? Authorizing Provider  albuterol (PROVENTIL HFA;VENTOLIN HFA) 108 (90 BASE) MCG/ACT inhaler Inhale 2 puffs into the lungs every 6 (six) hours as needed for wheezing or shortness of breath. 11/17/14   Kathyrn Drown, MD  ALPRAZolam Duanne Moron) 0.5 MG tablet Take 1 tablet (0.5 mg total) by mouth 2 (two) times daily as needed for anxiety. 05/02/15   Kathyrn Drown, MD  buPROPion (WELLBUTRIN SR) 150 MG 12 hr tablet Take 1  tablet (150 mg total) by mouth 2 (two) times daily. 05/02/15 11/17/18  Kathyrn Drown, MD  buPROPion (WELLBUTRIN XL) 150 MG 24 hr tablet TAKE 1 TABLET BY MOUTH DAILY. Patient not taking: Reported on 09/06/2015 07/18/15   Mikey Kirschner, MD  dicyclomine (BENTYL) 20 MG tablet Take 1 tablet (20 mg total) by mouth 3 (three) times daily as needed for spasms (abdominal cramping). 09/06/15   Clayton Bibles, PA-C  ondansetron (ZOFRAN) 4 MG tablet Take 1 tablet (4 mg total) by mouth every 8 (eight) hours as needed for nausea  or vomiting. 09/06/15   Clayton Bibles, PA-C  tretinoin (RETIN-A) 0.05 % cream Apply topically at bedtime. Patient not taking: Reported on 09/06/2015 11/17/14   Kathyrn Drown, MD    Family History Family History  Problem Relation Age of Onset  . Heart failure Father   . Stroke Father   . Breast cancer Maternal Aunt     50's  . Cancer Maternal Aunt 52    colon cancer  . Diabetes Paternal Grandfather   . Hypertension Maternal Grandmother   . Colon cancer Maternal Aunt   . Heart attack Mother     Social History Social History  Substance Use Topics  . Smoking status: Current Every Day Smoker    Packs/day: 0.50    Years: 25.00    Types: Cigarettes  . Smokeless tobacco: Never Used  . Alcohol use 0.0 oz/week     Comment: occassionaly     Allergies   Review of patient's allergies indicates no known allergies.   Review of Systems Review of Systems  Constitutional: Negative for appetite change, chills and fatigue.  HENT: Negative for congestion, ear discharge and sinus pressure.   Eyes: Negative for discharge.  Respiratory: Negative for cough.   Cardiovascular: Negative for chest pain.  Gastrointestinal: Positive for abdominal pain, diarrhea and vomiting. Negative for blood in stool and hematochezia.       Negative for hematemesis.  Genitourinary: Negative for frequency and hematuria.  Musculoskeletal: Negative for back pain.  Skin: Negative for rash.  Neurological: Negative for seizures and headaches.  Psychiatric/Behavioral: Negative for hallucinations.     Physical Exam Updated Vital Signs BP 111/89 (BP Location: Left Arm)   Pulse 90   Temp 98.8 F (37.1 C) (Oral)   Resp 16   Ht 5\' 6"  (1.676 m)   Wt 145 lb (65.8 kg)   LMP 06/12/2012   SpO2 96%   BMI 23.40 kg/m   Physical Exam  Constitutional: She is oriented to person, place, and time. She appears well-developed.  HENT:  Head: Normocephalic.  Mouth/Throat: Mucous membranes are dry.  Eyes: Conjunctivae and  EOM are normal. No scleral icterus.  Neck: Neck supple. No thyromegaly present.  Cardiovascular: Normal rate and regular rhythm.  Exam reveals no gallop and no friction rub.   No murmur heard. Pulmonary/Chest: No stridor. She has no wheezes. She has no rales. She exhibits no tenderness.  Abdominal: She exhibits no distension. There is tenderness. There is no rebound.  Mild tenderness throughout abdomen.  Musculoskeletal: Normal range of motion. She exhibits no edema.  Lymphadenopathy:    She has no cervical adenopathy.  Neurological: She is oriented to person, place, and time. She exhibits normal muscle tone. Coordination normal.  Skin: No rash noted. No erythema.  Psychiatric: She has a normal mood and affect. Her behavior is normal.     ED Treatments / Results  Labs (all labs ordered are listed, but only abnormal  results are displayed) Labs Reviewed  LIPASE, BLOOD  COMPREHENSIVE METABOLIC PANEL  CBC  URINALYSIS, ROUTINE W REFLEX MICROSCOPIC (NOT AT [facility])    EKG  EKG Interpretation None       Radiology No results found.  Procedures Procedures (including critical care time)  DIAGNOSTIC STUDIES: Oxygen Saturation is 96% on RA, adequate by my interpretation.    COORDINATION OF CARE: 9:32 PM Discussed treatment plan with pt at bedside and pt agreed to plan.   Medications Ordered in ED Medications - No data to display   Initial Impression / Assessment and Plan / ED Course  I have reviewed the triage vital signs and the nursing notes.  Pertinent labs & imaging results that were available during my care of the patient were reviewed by me and considered in my medical decision making (see chart for details).  Clinical Course   CT scan shows severe infectious enteritis. Patient will be admitted for IV fluids nausea medicine   The chart was scribed for me under my direct supervision.  I personally performed the history, physical, and medical decision making and all  procedures in the evaluation of this patient..   Final Clinical Impressions(s) / ED Diagnoses   Final diagnoses:  None    New Prescriptions New Prescriptions   No medications on file     Milton Ferguson, MD 09/07/15 2351

## 2015-09-07 NOTE — ED Notes (Signed)
Patient transported to CT 

## 2015-09-07 NOTE — ED Triage Notes (Signed)
Pt. reports persistent mid/low abdominal pain with emesis and diarrhea onset 2 days ago , seen here last night for the same complaints discharged home with no improvement .

## 2015-09-07 NOTE — ED Notes (Signed)
Pt reports that she came tonight "becasue I didn't want to wake my mom and dad up in the middle of the night" to come to ED.  She denies stoppoing meds recently, and inquires if food poisoning would make you this ill.

## 2015-09-08 ENCOUNTER — Encounter (HOSPITAL_COMMUNITY): Payer: Self-pay | Admitting: *Deleted

## 2015-09-08 DIAGNOSIS — F17210 Nicotine dependence, cigarettes, uncomplicated: Secondary | ICD-10-CM | POA: Diagnosis not present

## 2015-09-08 DIAGNOSIS — F418 Other specified anxiety disorders: Secondary | ICD-10-CM | POA: Diagnosis not present

## 2015-09-08 DIAGNOSIS — K529 Noninfective gastroenteritis and colitis, unspecified: Secondary | ICD-10-CM

## 2015-09-08 DIAGNOSIS — R935 Abnormal findings on diagnostic imaging of other abdominal regions, including retroperitoneum: Secondary | ICD-10-CM

## 2015-09-08 DIAGNOSIS — R112 Nausea with vomiting, unspecified: Secondary | ICD-10-CM | POA: Diagnosis not present

## 2015-09-08 DIAGNOSIS — R197 Diarrhea, unspecified: Secondary | ICD-10-CM | POA: Diagnosis not present

## 2015-09-08 DIAGNOSIS — J45909 Unspecified asthma, uncomplicated: Secondary | ICD-10-CM | POA: Diagnosis not present

## 2015-09-08 DIAGNOSIS — A09 Infectious gastroenteritis and colitis, unspecified: Secondary | ICD-10-CM

## 2015-09-08 DIAGNOSIS — Z79899 Other long term (current) drug therapy: Secondary | ICD-10-CM | POA: Diagnosis not present

## 2015-09-08 LAB — GASTROINTESTINAL PANEL BY PCR, STOOL (REPLACES STOOL CULTURE)

## 2015-09-08 LAB — C-REACTIVE PROTEIN: CRP: 0.8 mg/dL (ref <1.0)

## 2015-09-08 LAB — SEDIMENTATION RATE: Sed Rate: 13 mm/hr (ref 0–22)

## 2015-09-08 MED ORDER — SODIUM CHLORIDE 0.9 % IV SOLN: INTRAVENOUS | Status: DC

## 2015-09-08 MED ORDER — BISMUTH SUBSALICYLATE 262 MG/15ML PO SUSP
45.0000 mL | Freq: Three times a day (TID) | ORAL | Status: DC
  Administered 2015-09-08 – 2015-09-10 (×5): 45 mL via ORAL
  Filled 2015-09-08: qty 236

## 2015-09-08 MED ORDER — ONDANSETRON HCL 4 MG/2ML IJ SOLN
4.0000 mg | Freq: Four times a day (QID) | INTRAMUSCULAR | Status: DC | PRN
  Administered 2015-09-08 – 2015-09-09 (×5): 4 mg via INTRAVENOUS
  Filled 2015-09-08 (×4): qty 2

## 2015-09-08 MED ORDER — METOCLOPRAMIDE HCL 5 MG/ML IJ SOLN
10.0000 mg | Freq: Once | INTRAMUSCULAR | Status: AC
  Administered 2015-09-08: 10 mg via INTRAVENOUS
  Filled 2015-09-08: qty 2

## 2015-09-08 MED ORDER — SODIUM CHLORIDE 0.9 % IV SOLN
INTRAVENOUS | Status: DC
  Administered 2015-09-08 – 2015-09-10 (×5): via INTRAVENOUS

## 2015-09-08 MED ORDER — DIPHENOXYLATE-ATROPINE 2.5-0.025 MG PO TABS
2.0000 | ORAL_TABLET | Freq: Once | ORAL | Status: AC
  Administered 2015-09-08: 2 via ORAL
  Filled 2015-09-08: qty 2

## 2015-09-08 MED ORDER — ACETAMINOPHEN 325 MG PO TABS
650.0000 mg | ORAL_TABLET | Freq: Four times a day (QID) | ORAL | Status: DC | PRN
  Administered 2015-09-08 – 2015-09-09 (×5): 650 mg via ORAL
  Filled 2015-09-08 (×5): qty 2

## 2015-09-08 MED ORDER — ONDANSETRON HCL 4 MG PO TABS: 4.0000 mg | ORAL_TABLET | Freq: Four times a day (QID) | ORAL | Status: DC | PRN

## 2015-09-08 MED ORDER — CIPROFLOXACIN IN D5W 400 MG/200ML IV SOLN
400.0000 mg | Freq: Two times a day (BID) | INTRAVENOUS | Status: DC
  Administered 2015-09-08 – 2015-09-10 (×4): 400 mg via INTRAVENOUS
  Filled 2015-09-08 (×4): qty 200

## 2015-09-08 NOTE — H&P (Signed)
History and Physical    Ruth Robertson [id number] DOB: [birth year] DOA: 09/07/2015  PCP: Sallee Lange, MD  Patient coming from: home  Chief Complaint:   Nausea, vomiting, diarrhea  HPI: Ruth Robertson is a 50 y.o. female healthy with 2 days of nausea, vomiting and diarrhea.  She started with diarrhea 2 days ago which is nonbloody, no fevers.  Then started with vomiting with generalized abdominal crampiness.  About 5 diarrhea movements a day.  She reportss no recent traveling or eating of raw food.  No recent antibiotics.  No sick contacts.  Pt referred for admission as this was her 3rd ED visit in 2 days.     Review of Systems: As per HPI otherwise 10 point review of systems negative.   Past Medical History:  Diagnosis Date  . Asthma    ONLY WITH BRONCHITIS  . GAD (generalized anxiety disorder)   . Pneumonia   . PONV (postoperative nausea and vomiting)     Past Surgical History:  Procedure Laterality Date  . HYSTEROSCOPY  2006  . SALPINGOOPHORECTOMY Bilateral 08/18/2012   Procedure: SALPINGO OOPHORECTOMY;  Surgeon: Anastasio Auerbach, MD;  Location: [facility];  Service: Gynecology;  Laterality: Bilateral;  . VAGINAL HYSTERECTOMY N/A 08/18/2012   Procedure: HYSTERECTOMY VAGINAL;  Surgeon: Anastasio Auerbach, MD;  Location: [facility];  Service: Gynecology;  Laterality: N/A;  . VAGINAL HYSTERECTOMY     Vag.Hyst BSO     reports that she has been smoking Cigarettes.  She has a 12.50 pack-year smoking history. She has never used smokeless tobacco. She reports that she drinks alcohol. She reports that she does not use drugs.  No Known Allergies  Family History  Problem Relation Age of Onset  . Heart failure Father   . Stroke Father   . Breast cancer Maternal Aunt     50's  . Cancer Maternal Aunt 23    colon cancer  . Diabetes Paternal Grandfather   . Hypertension Maternal Grandmother   . Colon cancer Maternal Aunt   . Heart attack Mother     Prior to  Admission medications   Medication Sig Start Date End Date Taking? Authorizing Provider  buPROPion (WELLBUTRIN XL) 150 MG 24 hr tablet TAKE 1 TABLET BY MOUTH DAILY. 07/18/15  Yes Mikey Kirschner, MD  dicyclomine (BENTYL) 20 MG tablet Take 1 tablet (20 mg total) by mouth 3 (three) times daily as needed for spasms (abdominal cramping). 09/06/15  Yes Clayton Bibles, PA-C  ondansetron (ZOFRAN) 4 MG tablet Take 1 tablet (4 mg total) by mouth every 8 (eight) hours as needed for nausea or vomiting. 09/06/15  Yes Clayton Bibles, PA-C  albuterol (PROVENTIL HFA;VENTOLIN HFA) 108 (90 BASE) MCG/ACT inhaler Inhale 2 puffs into the lungs every 6 (six) hours as needed for wheezing or shortness of breath. Patient not taking: Reported on 09/07/2015 11/17/14   Kathyrn Drown, MD  ALPRAZolam Duanne Moron) 0.5 MG tablet Take 1 tablet (0.5 mg total) by mouth 2 (two) times daily as needed for anxiety. 05/02/15   Kathyrn Drown, MD  buPROPion (WELLBUTRIN SR) 150 MG 12 hr tablet Take 1 tablet (150 mg total) by mouth 2 (two) times daily. Patient not taking: Reported on 09/07/2015 05/02/15 11/17/18  Kathyrn Drown, MD  tretinoin (RETIN-A) 0.05 % cream Apply topically at bedtime. Patient not taking: Reported on 09/06/2015 11/17/14   Kathyrn Drown, MD    Physical Exam: Vitals:   09/07/15 2141 09/07/15 2200 09/08/15 0103 09/08/15 0123  BP: 117/91 122/92 125/79 92/64  Pulse: 83 83 79 72  Resp:    18  Temp:    99.3 F (37.4 C)  TempSrc:    Oral  SpO2: 92% 97% 96% 99%  Weight:    66.6 kg (146 lb 12.8 oz)  Height:    5\' 6"  (1.676 m)      Constitutional: NAD, calm, comfortable Vitals:   09/07/15 2141 09/07/15 2200 09/08/15 0103 09/08/15 0123  BP: 117/91 122/92 125/79 92/64  Pulse: 83 83 79 72  Resp:    18  Temp:    99.3 F (37.4 C)  TempSrc:    Oral  SpO2: 92% 97% 96% 99%  Weight:    66.6 kg (146 lb 12.8 oz)  Height:    5\' 6"  (1.676 m)   Eyes: PERRL, lids and conjunctivae normal ENMT: Mucous membranes are moist. Posterior pharynx  clear of any exudate or lesions.Normal dentition.  Neck: normal, supple, no masses, no thyromegaly Respiratory: clear to auscultation bilaterally, no wheezing, no crackles. Normal respiratory effort. No accessory muscle use.  Cardiovascular: Regular rate and rhythm, no murmurs / rubs / gallops. No extremity edema. 2+ pedal pulses. No carotid bruits.  Abdomen: no tenderness, no masses palpated. No hepatosplenomegaly. Bowel sounds positive.  Musculoskeletal: no clubbing / cyanosis. No joint deformity upper and lower extremities. Good ROM, no contractures. Normal muscle tone.  Skin: no rashes, lesions, ulcers. No induration Neurologic: CN 2-12 grossly intact. Sensation intact, DTR normal. Strength 5/5 in all 4.  Psychiatric: Normal judgment and insight. Alert and oriented x 3. Normal mood.    Labs on Admission: I have personally reviewed following labs and imaging studies  CBC:  Recent Labs Lab 09/06/15 1104 09/07/15 0315 09/07/15 2140  WBC 12.3* 9.1 7.2  HGB 18.2* 17.2* 15.0  HCT 54.9* 52.3* 46.4*  MCV 95.8 94.9 94.3  PLT 287 270 [id number]   Basic Metabolic Panel:  Recent Labs Lab 09/06/15 1104 09/07/15 0315 09/07/15 2140  NA 142 139 136  K 4.1 3.9 3.6  CL 104 104 104  CO2 26 27 28   GLUCOSE 125* 126* 142*  BUN 10 14 14   CREATININE 0.90 1.13* 0.93  CALCIUM 9.9 9.0 8.5*   GFR: Estimated Creatinine Clearance: 68.5 mL/min (by C-G formula based on SCr of 0.93 mg/dL). Liver Function Tests:  Recent Labs Lab 09/06/15 1104 09/07/15 0315 09/07/15 2140  AST 20 17 16   ALT 22 17 18   ALKPHOS 63 55 50  BILITOT 0.6 0.7 0.5  PROT 7.5 6.4* 6.7  ALBUMIN 4.4 3.8 3.8    Recent Labs Lab 09/06/15 1104 09/07/15 0315 09/07/15 2140  LIPASE 35 163* 39   No results for input(s): AMMONIA in the last 168 hours. Coagulation Profile: No results for input(s): INR, PROTIME in the last 168 hours. Cardiac Enzymes: No results for input(s): CKTOTAL, CKMB, CKMBINDEX, TROPONINI in the last 168  hours. BNP (last 3 results) No results for input(s): PROBNP in the last 8760 hours. HbA1C: No results for input(s): HGBA1C in the last 72 hours. CBG: No results for input(s): GLUCAP in the last 168 hours. Lipid Profile: No results for input(s): CHOL, HDL, LDLCALC, TRIG, CHOLHDL, LDLDIRECT in the last 72 hours. Thyroid Function Tests: No results for input(s): TSH, T4TOTAL, FREET4, T3FREE, THYROIDAB in the last 72 hours. Anemia Panel: No results for input(s): VITAMINB12, FOLATE, FERRITIN, TIBC, IRON, RETICCTPCT in the last 72 hours. Urine analysis:    Component Value Date/Time   COLORURINE YELLOW 09/07/2015 2250  APPEARANCEUR CLEAR 09/07/2015 2250   LABSPEC <1.005 (L) 09/07/2015 2250   PHURINE 6.5 09/07/2015 2250   GLUCOSEU NEGATIVE 09/07/2015 2250   HGBUR SMALL (A) 09/07/2015 2250   BILIRUBINUR NEGATIVE 09/07/2015 2250   BILIRUBINUR + 07/01/2012 1002   KETONESUR NEGATIVE 09/07/2015 2250   PROTEINUR NEGATIVE 09/07/2015 2250   UROBILINOGEN 0.2 10/21/2013 1609   NITRITE NEGATIVE 09/07/2015 2250   LEUKOCYTESUR NEGATIVE 09/07/2015 2250   Sepsis Labs: !!!!!!!!!!!!!!!!!!!!!!!!!!!!!!!!!!!!!!!!!!!! @LABRCNTIP (procalcitonin:4,lacticidven:4) )No results found for this or any previous visit (from the past 240 hour(s)).   Radiological Exams on Admission: Ct Abdomen Pelvis W Contrast  Result Date: 09/07/2015 CLINICAL DATA:  50 year old female with abdominal pain nausea and vomiting for 3 days. Initial encounter. EXAM: CT ABDOMEN AND PELVIS WITH CONTRAST TECHNIQUE: Multidetector CT imaging of the abdomen and pelvis was performed using the standard protocol following bolus administration of intravenous contrast. CONTRAST:  148mL ISOVUE-300 IOPAMIDOL (ISOVUE-300) INJECTION 61% COMPARISON:  Chest radiographs 03/20/2014. FINDINGS: Negative lung bases.  No pericardial or pleural effusion. No acute osseous abnormality identified. Moderate volume of pelvic free fluid, with simple fluid densitometry.  Uterus is surgically absent. Adnexa are diminutive or absent. Diminutive urinary bladder. Decompressed distal colon. Negative left colon. Negative transverse colon. There is some fluid in the right colon overall the large bowel is decompressed. There is mild wall thickening and enhancement at the cecum. There is gas and fluid in the appendix which is normal by size criteria although there is suggestion of some mild appendiceal enhancement. Thick walled and hyper enhancing small bowel loops dilated with gas and fluid throughout the mid abdomen. The duodenum and proximal jejunum are normal. The small bowel wall thickening progresses distally, and is severe in the distal ileum. The E abnormal small bowel wall thickening continues to the terminal ileum, although the most distal small bowel is less dilated. There is a small volume of interloop mesenteric fluid throughout the abdomen. No free air. Trace free fluid about the liver. Normal liver enhancement. Negative gallbladder, spleen, pancreas and adrenal glands. Portal venous system is patent. Major arterial structures in the abdomen and pelvis are normal. No lymphadenopathy. Bilateral renal enhancement and contrast excretion is normal. IMPRESSION: 1. Thick walled and inflamed small bowel loops throughout the abdomen, with dilated mid jejunum, but gradual tapering and no transition point. The bowel wall inflammation progresses distally and continues to the terminal ileum, where the cecum and appendix also appear mildly involved. Associated free fluid, but no free air. 2. Top differential considerations are Inflammatory Bowel Disease (Crohn disease) and Severe Infectious Enteritis. Electronically Signed   By: Genevie Ann M.D.   On: 09/07/2015 22:52    Assessment/Plan 50 yo female with acute gastroenteritis likely viral etiology  Principal Problem:   Gastroenteritis- supportive care.  obs on med.  Ivf.  Wbc nml and no fever.  nonbloody diarrhea.  Ct shows inflammed  bowel.  Symptoms better with antiemetics in the ED.    Active Problems:   Depression with anxiety- noted   obs on medical bed.     DVT prophylaxis: scds  Code Status:   Full code  Isaah Furry A MD [facility]  If 7PM-7AM, please contact night-coverage [email or web address] Password [email or web address]  09/08/2015, 1:28 AM

## 2015-09-08 NOTE — Progress Notes (Signed)
Initial Nutrition Assessment  DOCUMENTATION CODES:  Not applicable  INTERVENTION:  Meal requests passed to dietary  Monitor Po intake and diet tolerance  Declined supplements, if po intake does not improve can follow up and discuss further.  NUTRITION DIAGNOSIS:  Inadequate oral intake related to acute illness, nausea, vomiting, diarrhea as evidenced by per pt report of essentially no intake x3 days  GOAL:  Patient will meet greater than or equal to 90% of their needs  MONITOR:  PO intake, Diet advancement, Labs, I & O's  REASON FOR ASSESSMENT:  Malnutrition Screening Tool    ASSESSMENT:  50 y/o female PMHx depression/anxiety who presents with 2 days N/V/D. Admitted for workup of gastroenteritis.   Pt reports that this started on Tuesday. Since that time she has been having severe nausea, vomiting and diarrhea. The diarrhea and vomiting have sometimes been simultaneous. She has not had almost anything to eat/drink since that time. "I have been trying".  RD asked if she has lost weight through this ordeal. She responds "You would think so". "I think the reason I havent is all this swelling". She points to her stomach which is very notably distended. RD asked if they mentioned giving her pepto bismol. She said no. She is belching to some relief.  Weight history shows she is about at her baseline, do not think her current gas would change her weight all too much. .   Today, pt did say she was able to tolerate sweet tea, apple juice and some soup.   RD offered supplements and described why they are important and can aid in a quicker recovery, but she did not want them. Worked with patient to find a different foods she wanted. These requests were sent to dietary.   She was notably anxious. She asks "When will this get better". RD mentioned how MD note had cipro 3 days so likely she would feel better over the next couple days, but if she wanted a more reliable opinion to ask MD or  pharm D. She was upset it would take that long.   NFPE: not attempted  Medications: Cipro, pepto bismol. Labs reviewed: Glu: 120-140   Recent Labs Lab 09/06/15 1104 09/07/15 0315 09/07/15 2140  NA 142 139 136  K 4.1 3.9 3.6  CL 104 104 104  CO2 26 27 28   BUN 10 14 14   CREATININE 0.90 1.13* 0.93  CALCIUM 9.9 9.0 8.5*  GLUCOSE 125* 126* 142*   Diet Order:  Diet full liquid Room service appropriate? Yes; Fluid consistency: Thin  Skin:  Reviewed, no issues  Last BM:  8/4  Height:  Ht Readings from Last 1 Encounters:  09/08/15 5\' 6"  (1.676 m)   Weight:  Wt Readings from Last 1 Encounters:  09/08/15 146 lb 12.8 oz (66.6 kg)   Wt Readings from Last 10 Encounters:  09/08/15 146 lb 12.8 oz (66.6 kg)  09/07/15 145 lb 7 oz (66 kg)  09/06/15 145 lb (65.8 kg)  05/02/15 149 lb (67.6 kg)  02/23/15 147 lb (66.7 kg)  11/24/14 139 lb (63 kg)  11/17/14 138 lb (62.6 kg)  03/29/14 139 lb (63 kg)  12/16/13 140 lb 6 oz (63.7 kg)  10/21/13 139 lb (63 kg)  Admit weight: 145 lbs  Ideal Body Weight:  59.1 kg  BMI:  Body mass index is 23.69 kg/m.  Estimated Nutritional Needs:  Kcal:  1650-1850 (25-28 kcal/kg bw) Protein:  66-79 g (1-1.2 g/kg ibw) Fluid:  >2 liters + enough  to replace stool losses  EDUCATION NEEDS:  No education needs identified at this time  Burtis Junes RD, LDN, [facility] Pager: [phone] 09/08/2015 5:10 PM

## 2015-09-08 NOTE — Care Management Note (Signed)
Case Management Note  Patient Details  Name: Ruth Robertson MRN: [id number] Date of Birth: July 14, 1965  Subjective/Objective:      Patient adm from home with gastroenteritis. She is ind with ADL's, has a PCP and insurance. Reports no issues.               Action/Plan: Anticipate DC with self care.   Expected Discharge Date:  09/11/15               Expected Discharge Plan:  Home/Self Care  In-House Referral:  NA  Discharge planning Services  CM Consult  Post Acute Care Choice:  NA Choice offered to:  NA  DME Arranged:    DME Agency:     HH Arranged:    HH Agency:     Status of Service:  Completed, signed off  If discussed at [employer] of Stay Meetings, dates discussed:    Additional Comments:  Angelina Neece, Chauncey Reading, RN 09/08/2015, 12:55 PM

## 2015-09-08 NOTE — Consult Note (Signed)
Referring Provider: [facility] Primary Care Physician:  Sallee Lange, MD Primary Gastroenterologist:  Dr. Oneida Alar  Date of Admission: 09/07/15 Date of Consultation: 09/08/15  Reason for Consultation:  N/V, diarrhea; ? Gastroenteritis vs IBD  HPI:  Ruth Robertson is a 50 y.o. female with a past medical history of PONV, pneumonia, anxiety disorder, asthma. Reviewed hospitalist note: She presented to the ER with 2 days of nausea, vomiting, and non-bloody diarrhea for the previous 2 days and without fever. Began vomiting day of presentation and abdominal cramping. Having 5 loose bowel movements a day; no recent antibiotics, dietary changes, antibiotics. She was admitted for further evaluation after 3 ER visits in 2 days as well as supportive care, IV fluids. Symptoms improved with antiemetics in the ER.  Today she confirms the above information. States abdominal pain improved, now a contand dull "soreness." N/V improved. When I entered the room she was sittping on fluids. States they're staying down well. Had a colonoscopy at [facility] about 6 years ago, was recommended to have repeat in 5 years which she has not done. She states she knows someone who died of a bowel perforation and she was wanting to not have it done. Denies further GI symptoms. "I hate needles and don't want to get stuck again."   Past Medical History:  Diagnosis Date  . Asthma    ONLY WITH BRONCHITIS  . GAD (generalized anxiety disorder)   . Pneumonia   . PONV (postoperative nausea and vomiting)     Past Surgical History:  Procedure Laterality Date  . HYSTEROSCOPY  2006  . SALPINGOOPHORECTOMY Bilateral 08/18/2012   Procedure: SALPINGO OOPHORECTOMY;  Surgeon: Anastasio Auerbach, MD;  Location: [facility];  Service: Gynecology;  Laterality: Bilateral;  . VAGINAL HYSTERECTOMY N/A 08/18/2012   Procedure: HYSTERECTOMY VAGINAL;  Surgeon: Anastasio Auerbach, MD;  Location: [facility];  Service: Gynecology;  Laterality: N/A;  .  VAGINAL HYSTERECTOMY     Vag.Hyst BSO    Prior to Admission medications   Medication Sig Start Date End Date Taking? Authorizing Provider  buPROPion (WELLBUTRIN XL) 150 MG 24 hr tablet TAKE 1 TABLET BY MOUTH DAILY. 07/18/15  Yes Mikey Kirschner, MD  dicyclomine (BENTYL) 20 MG tablet Take 1 tablet (20 mg total) by mouth 3 (three) times daily as needed for spasms (abdominal cramping). 09/06/15  Yes Clayton Bibles, PA-C  ondansetron (ZOFRAN) 4 MG tablet Take 1 tablet (4 mg total) by mouth every 8 (eight) hours as needed for nausea or vomiting. 09/06/15  Yes Clayton Bibles, PA-C  albuterol (PROVENTIL HFA;VENTOLIN HFA) 108 (90 BASE) MCG/ACT inhaler Inhale 2 puffs into the lungs every 6 (six) hours as needed for wheezing or shortness of breath. Patient not taking: Reported on 09/07/2015 11/17/14   Kathyrn Drown, MD  ALPRAZolam Duanne Moron) 0.5 MG tablet Take 1 tablet (0.5 mg total) by mouth 2 (two) times daily as needed for anxiety. 05/02/15   Kathyrn Drown, MD  buPROPion (WELLBUTRIN SR) 150 MG 12 hr tablet Take 1 tablet (150 mg total) by mouth 2 (two) times daily. Patient not taking: Reported on 09/07/2015 05/02/15 11/17/18  Kathyrn Drown, MD  tretinoin (RETIN-A) 0.05 % cream Apply topically at bedtime. Patient not taking: Reported on 09/06/2015 11/17/14   Kathyrn Drown, MD    Current Facility-Administered Medications  Medication Dose Route Frequency Provider Last Rate Last Dose  . 0.9 %  sodium chloride infusion   Intravenous Continuous Phillips Grout, MD 100 mL/hr at 09/08/15 3244    .  ondansetron (ZOFRAN) tablet 4 mg  4 mg Oral Q6H PRN Phillips Grout, MD       Or  . ondansetron [employer]) injection 4 mg  4 mg Intravenous Q6H PRN Phillips Grout, MD   4 mg at 09/08/15 5361    Allergies as of 09/07/2015  . (No Known Allergies)    Family History  Problem Relation Age of Onset  . Heart failure Father   . Stroke Father   . Breast cancer Maternal Aunt     50's  . Cancer Maternal Aunt 67    colon cancer  .  Diabetes Paternal Grandfather   . Hypertension Maternal Grandmother   . Colon cancer Maternal Aunt   . Heart attack Mother     Social History   Social History  . Marital status: Divorced    Spouse name: N/A  . Number of children: N/A  . Years of education: N/A   Occupational History  . Not on file.   Social History Main Topics  . Smoking status: Current Every Day Smoker    Packs/day: 0.50    Years: 25.00    Types: Cigarettes  . Smokeless tobacco: Never Used  . Alcohol use 0.0 oz/week     Comment: occassionaly  . Drug use: No  . Sexual activity: Yes    Partners: Male     Comment: HYST-1st intercourse 31 yo-5 partners   Other Topics Concern  . Not on file   Social History Narrative  . No narrative on file    Review of Systems: General: Negative for anorexia, weight loss, fever, chills, fatigue, weakness. ENT: Negative for hoarseness, difficulty swallowing. CV: Negative for chest pain, angina, palpitations, peripheral edema.  Respiratory: Negative for dyspnea at rest, cough, sputum, wheezing.  GI: See history of present illness. Endo: Negative for unusual weight change.  Heme: Negative for bruising or bleeding.  Physical Exam: Vital signs in last 24 hours: Temp:  [98.8 F (37.1 C)-99.3 F (37.4 C)] 99.1 F (37.3 C) (08/04 0623) Pulse Rate:  [72-90] 80 (08/04 0623) Resp:  [16-18] 18 (08/04 0623) BP: (92-125)/(64-92) 100/64 (08/04 0623) SpO2:  [92 %-99 %] 99 % (08/04 0623) Weight:  [145 lb (65.8 kg)-146 lb 12.8 oz (66.6 kg)] 146 lb 12.8 oz (66.6 kg) (08/04 0123) Last BM Date: 09/08/15 (In Ed) General:   Alert,  Well-developed, well-nourished, pleasant and cooperative in NAD Head:  Normocephalic and atraumatic. Eyes:  Sclera clear, no icterus. Conjunctiva pink. Ears:  Normal auditory acuity. Lungs:  Clear throughout to auscultation.  No wheezes, crackles, or rhonchi. No acute distress. Heart:  Regular rate and rhythm; no murmurs, clicks, rubs,  or  gallops. Abdomen:  Soft and nondistended. No worsening TTP. No masses, hepatosplenomegaly or hernias noted. Normal bowel sounds, without guarding, and without rebound.   Rectal:  Deferred.   Msk:  Symmetrical without gross deformities Extremities:  Without clubbing or edema. Neurologic:  Alert and  oriented x4;  grossly normal neurologically. Psych:  Alert and cooperative. Normal mood and affect.  Intake/Output from previous day: 08/03 0701 - 08/04 0700 In: 180 [I.V.:180] Out: -  Intake/Output this shift: No intake/output data recorded.  Lab Results:  Recent Labs  09/06/15 1104 09/07/15 0315 09/07/15 2140  WBC 12.3* 9.1 7.2  HGB 18.2* 17.2* 15.0  HCT 54.9* 52.3* 46.4*  PLT 287 270 236   BMET  Recent Labs  09/06/15 1104 09/07/15 0315 09/07/15 2140  NA 142 139 136  K 4.1 3.9 3.6  CL  104 104 104  CO2 _0 GLUCOSE 125* 126* 142*  BUN _1 CREATININE 0.90 1.13* 0.93  CALCIUM 9.9 9.0 8.5*   LFT  Recent Labs  09/06/15 1104 09/07/15 0315 09/07/15 2140  PROT 7.5 6.4* 6.7  ALBUMIN 4.4 3.8 3.8  AST _2 ALT _3 ALKPHOS 63 55 50  BILITOT 0.6 0.7 0.5   PT/INR No results for input(s): LABPROT, INR in the last 72 hours. Hepatitis Panel No results for input(s): HEPBSAG, HCVAB, HEPAIGM, HEPBIGM in the last 72 hours. C-Diff No results for input(s): CDIFFTOX in the last 72 hours.  Studies/Results: Ct Abdomen Pelvis W Contrast  Result Date: 09/07/2015 CLINICAL DATA:  50 year old female with abdominal pain nausea and vomiting for 3 days. Initial encounter. EXAM: CT ABDOMEN AND PELVIS WITH CONTRAST TECHNIQUE: Multidetector CT imaging of the abdomen and pelvis was performed using the standard protocol following bolus administration of intravenous contrast. CONTRAST:  152m ISOVUE-300 IOPAMIDOL (ISOVUE-300) INJECTION 61% COMPARISON:  Chest radiographs 03/20/2014. FINDINGS: Negative lung bases.  No pericardial or pleural effusion. No acute osseous  abnormality identified. Moderate volume of pelvic free fluid, with simple fluid densitometry. Uterus is surgically absent. Adnexa are diminutive or absent. Diminutive urinary bladder. Decompressed distal colon. Negative left colon. Negative transverse colon. There is some fluid in the right colon overall the large bowel is decompressed. There is mild wall thickening and enhancement at the cecum. There is gas and fluid in the appendix which is normal by size criteria although there is suggestion of some mild appendiceal enhancement. Thick walled and hyper enhancing small bowel loops dilated with gas and fluid throughout the mid abdomen. The duodenum and proximal jejunum are normal. The small bowel wall thickening progresses distally, and is severe in the distal ileum. The E abnormal small bowel wall thickening continues to the terminal ileum, although the most distal small bowel is less dilated. There is a small volume of interloop mesenteric fluid throughout the abdomen. No free air. Trace free fluid about the liver. Normal liver enhancement. Negative gallbladder, spleen, pancreas and adrenal glands. Portal venous system is patent. Major arterial structures in the abdomen and pelvis are normal. No lymphadenopathy. Bilateral renal enhancement and contrast excretion is normal. IMPRESSION: 1. Thick walled and inflamed small bowel loops throughout the abdomen, with dilated mid jejunum, but gradual tapering and no transition point. The bowel wall inflammation progresses distally and continues to the terminal ileum, where the cecum and appendix also appear mildly involved. Associated free fluid, but no free air. 2. Top differential considerations are Inflammatory Bowel Disease (Crohn disease) and Severe Infectious Enteritis. Electronically Signed   By: HGenevie AnnM.D.   On: 09/07/2015 22:52    Impression: Symptomatically improved today on fluids and medications. CT query Crohn's vs infectious gastroenteritis. No bleeding  noted. No anemia. Has avoided recommended repeat colonoscopy due to knowing someone who suffered a complication. Discussed with patient and family, at length, the rationale for colonoscopy to screen for colon cancer (especially with an apparent history of adenomas) and the need to likely do one sooner rather than later to evaluated for possible Crohn's ileitis. Likely will need colonoscopy as an outpatient.  Labs: She did have initial elevated lipase (163) yesterday which today is back to baseline at 39. CBC with apparent hemoconcentration, improved today. CMP elevated Cr 1.13 yesterday improved o 0.93 today.   Plan: 1. ESR and CRP today 2. Continue pain and nausea management 3. Monitor for  GI bleeding 4. Colonoscopy as an outpatient to evaluate for possible Crohn's as well as surveillance. 5. Supportive measures    Walden Field, AGNP-C Adult & Gerontological Nurse Practitioner [facility]    LOS: 0 days     09/08/2015, 9:43 AM

## 2015-09-08 NOTE — ED Notes (Signed)
Report given to [facility] Dept [facility] all questions answered

## 2015-09-08 NOTE — Progress Notes (Signed)
  PROGRESS NOTE  Ruth Robertson [id number] DOB: 1965-02-26 DOA: 09/07/2015 PCP: Ruth Lange, MD  Brief Narrative: 11 with nausea, vomiting, diarrhea for several days, seen in the emergency department twice. Admitted for refractory nausea, vomiting, diarrhea and abnormal CT findings of severe enteritis versus inflammatory bowel disease.  Assessment/Plan: 1. Nausea, vomiting, diarrhea. Spontaneously improved. Tolerating clear liquids. CT showed thick and inflamed small bowel loops progressive to the appendix. Differential includes inflammatory bowel disease, severe infectious enteritis.. WBC nml and afebrile. LFTs and lipase unremarkable. Diarrhea is nonbloody.  2. GAD. Appears stable. 3. Patient works for [employer] and will be graduating with an [employer] 8/5   Continue supportive care   Continue IVF  Follow GI consultation  DVT prophylaxis: SCDs Code Status: Full Family Communication: No family at bedside Disposition Plan: Anticipate discharge home in 24 hours  Ruth Hodgkins, MD  [facility] Direct contact: [phone] --Via [place]  --[email or web address]; password [email or web address]  7PM-7AM contact night coverage as above 09/08/2015, 5:34 AM  LOS: 0 days   Consultants:  GI  Procedures:  None  Antimicrobials:  None  HPI/Subjective: Pt is feeling better today. Admits to back pain. Diarrhea during the night. Denies N/V. Can tolerate liquids.  Objective: Vitals:   09/07/15 2141 09/07/15 2200 09/08/15 0103 09/08/15 0123  BP: 117/91 122/92 125/79 92/64  Pulse: 83 83 79 72  Resp:    18  Temp:    99.3 F (37.4 C)  TempSrc:    Oral  SpO2: 92% 97% 96% 99%  Weight:    66.6 kg (146 lb 12.8 oz)  Height:    5\' 6"  (1.676 m)   No intake or output data in the 24 hours ending 09/08/15 0534   Filed Weights   09/07/15 2125 09/08/15 0123  Weight: 65.8 kg (145 lb) 66.6 kg (146 lb 12.8 oz)    Exam:    Constitutional:  . Appears calm, Mildly  uncomfortable Respiratory:  . CTA bilaterally, no w/r/r.  . Respiratory effort normal. No retractions or accessory muscle use Cardiovascular:  . RRR, no m/r/g . No LE extremity edema   Abdomen:  . Soft, nondistended, nontender  I have personally reviewed following labs and imaging studies:  No new labs  Scheduled Meds:  Continuous Infusions: . sodium chloride 100 mL/hr at 09/08/15 [id number]    Principal Problem:   Gastroenteritis Active Problems:   Depression with anxiety   LOS: 0 days   Time spent 25 minutes   By signing my name below, I, Ruth Robertson, attest that this documentation has been prepared under the direction and in the presence of Ruth Butikofer P. Sarajane Jews, MD. Electronically signed: Hilbert Robertson, Scribe.  09/08/15, 12:12 PM  I personally performed the services described in this documentation. All medical record entries made by the scribe were at my direction. I have reviewed the chart and agree that the record reflects my personal performance and is accurate and complete. Ruth Hodgkins, MD

## 2015-09-09 DIAGNOSIS — R112 Nausea with vomiting, unspecified: Secondary | ICD-10-CM | POA: Diagnosis not present

## 2015-09-09 DIAGNOSIS — R935 Abnormal findings on diagnostic imaging of other abdominal regions, including retroperitoneum: Secondary | ICD-10-CM | POA: Diagnosis not present

## 2015-09-09 DIAGNOSIS — F17210 Nicotine dependence, cigarettes, uncomplicated: Secondary | ICD-10-CM | POA: Diagnosis not present

## 2015-09-09 DIAGNOSIS — J45909 Unspecified asthma, uncomplicated: Secondary | ICD-10-CM | POA: Diagnosis not present

## 2015-09-09 DIAGNOSIS — K529 Noninfective gastroenteritis and colitis, unspecified: Secondary | ICD-10-CM | POA: Diagnosis not present

## 2015-09-09 DIAGNOSIS — Z79899 Other long term (current) drug therapy: Secondary | ICD-10-CM | POA: Diagnosis not present

## 2015-09-09 DIAGNOSIS — R197 Diarrhea, unspecified: Secondary | ICD-10-CM | POA: Diagnosis not present

## 2015-09-09 DIAGNOSIS — A09 Infectious gastroenteritis and colitis, unspecified: Secondary | ICD-10-CM | POA: Diagnosis not present

## 2015-09-09 LAB — BASIC METABOLIC PANEL
Anion gap: 9 (ref 5–15)
BUN: 6 mg/dL (ref 6–20)
CO2: 24 mmol/L (ref 22–32)
Calcium: 8 mg/dL — ABNORMAL LOW (ref 8.9–10.3)
Chloride: 105 mmol/L (ref 101–111)
Creatinine, Ser: 0.74 mg/dL (ref 0.44–1.00)
GFR calc Af Amer: >60 mL/min (ref >60)
GFR calc non Af Amer: >60 mL/min (ref >60)
Glucose, Bld: 106 mg/dL — ABNORMAL HIGH (ref 65–99)
Potassium: 3.7 mmol/L (ref 3.5–5.1)
Sodium: 138 mmol/L (ref 135–145)

## 2015-09-09 LAB — CBC
HCT: 43 % (ref 36.0–46.0)
Hemoglobin: 13.9 g/dL (ref 12.0–15.0)
MCH: 30.8 pg (ref 26.0–34.0)
MCHC: 32.3 g/dL (ref 30.0–36.0)
MCV: 95.3 fL (ref 78.0–100.0)
Platelets: 200 10*3/uL (ref 150–400)
RBC: 4.51 MIL/uL (ref 3.87–5.11)
RDW: 13.4 % (ref 11.5–15.5)
WBC: 4.5 10*3/uL (ref 4.0–10.5)

## 2015-09-09 MED ORDER — ACETAMINOPHEN 325 MG PO TABS
325.0000 mg | ORAL_TABLET | Freq: Once | ORAL | Status: AC
  Administered 2015-09-09: 325 mg via ORAL
  Filled 2015-09-09: qty 1

## 2015-09-09 MED ORDER — PROMETHAZINE HCL 25 MG RE SUPP: 12.5000 mg | Freq: Four times a day (QID) | RECTAL | Status: DC | PRN

## 2015-09-09 MED ORDER — PROMETHAZINE HCL 12.5 MG PO TABS
12.5000 mg | ORAL_TABLET | Freq: Four times a day (QID) | ORAL | Status: DC | PRN
  Filled 2015-09-09: qty 1

## 2015-09-09 NOTE — Progress Notes (Signed)
PROGRESS NOTE  Ruth Robertson MGN:[id number] DOB: 1965-06-01 DOA: 09/07/2015 PCP: Sallee Lange, MD  Brief Narrative: 12 with nausea, vomiting, diarrhea for several days, seen in the emergency department twice. Admitted for refractory nausea, vomiting, diarrhea and abnormal CT findings of severe enteritis versus inflammatory bowel disease. Pt continues to improve and is likely to be discharged later today.  Assessment/Plan: 1. Nausea, vomiting, diarrhea secondary to presumed infectious enteritis. Inflammatory bowel disease considered, however CRP, ESR unremarkable, no blood. GI evaluated, plan outpatient follow-up. 2. Presumed infectious enteritis. Hemodynamics stable, remains afebrile. Laboratory studies unremarkable. No evidence of severe infection or process. 3. Patient works for [employer] and graduated with an [employer] 8/5   Clinically stable but no improvement. Ongoing distention, abdominal pain and discomfort as well as nausea. Continue supportive care, antibiotics per GI. Antiemetics and analgesics.  Reviewed CT findings with patient, mother as well as laboratory studies. Reviewed current diagnoses and treatment plan with patient and family.  DVT prophylaxis: SCDs Code Status: Full Family Communication: Mother at bedside Disposition Plan: Anticipate discharge home today  [name], MD  [facility] Direct contact: [phone] --Via amion app [place]  --[email or web address]; password [email or web address]  7PM-7AM contact night coverage as above 09/09/2015, 6:14 AM  LOS: 0 days   Consultants:  GI  Procedures:  None  Antimicrobials:  Cipro 8/4 >> 8/7  HPI/Subjective: Has some generalized abdominal pain and bloating today. Nausea. Dry heaves. Several episodes of diarrhea. Minimal oral intake.  Objective: Vitals:   09/08/15 0123 09/08/15 0623 09/08/15 2244 09/09/15 0511  BP: 92/64 100/64 108/78 128/83  Pulse: 72 80 69 73  Resp: 18 18 20 14   Temp: 99.3 F (37.4 C) 99.1 F  (37.3 C) 99 F (37.2 C) 99 F (37.2 C)  TempSrc: Oral Oral Oral Oral  SpO2: 99% 99% 96% 97%  Weight: 66.6 kg (146 lb 12.8 oz)     Height: 5' 6"  (1.676 m)       Intake/Output Summary (Last 24 hours) at 09/09/15 0614 Last data filed at 09/08/15 1758  Gross per 24 hour  Intake              240 ml  Output                0 ml  Net              240 ml     Filed Weights   09/07/15 2125 09/08/15 0123  Weight: 65.8 kg (145 lb) 66.6 kg (146 lb 12.8 oz)    Exam:   Constitutional:  . Appears calm, mildly uncomfortable Respiratory:  . CTA bilaterally, no w/r/r.  . Respiratory effort normal. No retractions or accessory muscle use Cardiovascular:  . RRR, no m/r/g . No LE extremity edema   Abdomen:  Marland Kitchen Mild generalized tenderness. Distended. No guarding. Psychiatric:  . judgement and insight appear normal . Mental status o Mood, affect appropriate  I have personally reviewed following labs and imaging studies:  Basic metabolic panel, CBC unremarkable  ESR and CRP negative  Scheduled Meds: . bismuth subsalicylate  45 mL Oral TID  . ciprofloxacin  400 mg Intravenous Q12H   Continuous Infusions: . sodium chloride 100 mL/hr at 09/09/15 0229    Principal Problem:   Gastroenteritis Active Problems:   Depression with anxiety   Diarrhea of infectious origin   Abnormal CT of the abdomen   LOS: 0 days   Time spent 35 minutes, Greater than 50% in counseling and coordination of care  By signing my name below, I, Hilbert Odor, attest that this documentation has been prepared under the direction and in the presence of [place]. Sarajane Jews, MD. Electronically signed: Hilbert Odor, Scribe.  09/09/15,   I personally performed the services described in this documentation. All medical record entries made by the scribe were at my direction. I have reviewed the chart and agree that the record reflects my personal performance and is accurate and complete. [name],  MD

## 2015-09-10 ENCOUNTER — Encounter: Payer: Self-pay | Admitting: Family Medicine

## 2015-09-10 DIAGNOSIS — R197 Diarrhea, unspecified: Secondary | ICD-10-CM | POA: Diagnosis not present

## 2015-09-10 DIAGNOSIS — F17210 Nicotine dependence, cigarettes, uncomplicated: Secondary | ICD-10-CM | POA: Diagnosis not present

## 2015-09-10 DIAGNOSIS — K529 Noninfective gastroenteritis and colitis, unspecified: Secondary | ICD-10-CM | POA: Diagnosis not present

## 2015-09-10 DIAGNOSIS — A09 Infectious gastroenteritis and colitis, unspecified: Secondary | ICD-10-CM | POA: Diagnosis not present

## 2015-09-10 DIAGNOSIS — R112 Nausea with vomiting, unspecified: Secondary | ICD-10-CM | POA: Diagnosis not present

## 2015-09-10 DIAGNOSIS — J45909 Unspecified asthma, uncomplicated: Secondary | ICD-10-CM | POA: Diagnosis not present

## 2015-09-10 DIAGNOSIS — R935 Abnormal findings on diagnostic imaging of other abdominal regions, including retroperitoneum: Secondary | ICD-10-CM | POA: Diagnosis not present

## 2015-09-10 DIAGNOSIS — Z79899 Other long term (current) drug therapy: Secondary | ICD-10-CM | POA: Diagnosis not present

## 2015-09-10 MED ORDER — CIPROFLOXACIN HCL 500 MG PO TABS: 500.0000 mg | ORAL_TABLET | Freq: Two times a day (BID) | ORAL | 0 refills | Status: DC

## 2015-09-10 MED ORDER — CIPROFLOXACIN HCL 250 MG PO TABS: 500.0000 mg | ORAL_TABLET | Freq: Two times a day (BID) | ORAL | Status: DC

## 2015-09-10 MED ORDER — ONDANSETRON HCL 4 MG PO TABS: 4.0000 mg | ORAL_TABLET | Freq: Four times a day (QID) | ORAL | 0 refills | Status: DC | PRN

## 2015-09-10 NOTE — Discharge Summary (Addendum)
Physician Discharge Summary  Ruth Robertson [id number]:[id number] DOB: August 15, 1965 DOA: 09/07/2015  PCP: Sallee Lange, MD  Admit date: 09/07/2015 Discharge date: 09/10/2015  Recommendations for Outpatient Follow-up:  1. Recommend outpatient surveillance colonoscopy   Discharge Diagnoses:  1. Nausea, vomiting, diarrhea secondary to presumed infectious enteritis. 2. Infectious enteritis.  Discharge Condition: Stable Disposition: Home  Diet recommendation: regular  Filed Weights   09/07/15 2125 09/08/15 0123  Weight: 65.8 kg (145 lb) 66.6 kg (146 lb 12.8 oz)    History of present illness:  50 year old woman with nausea, vomiting, diarrhea for several days, seen in the emergency department twice. Admitted for refractory nausea, vomiting, diarrhea and abnormal CT findings of severe enteritis versus inflammatory bowel disease.   Hospital Course:  Patient was admitted, treated with supportive care, seen by GI in consultation. Empiric antibiotics were added. GI pathogen panel was negative. ESR and CRP were unremarkable. No history of bleeding. CT differential was noted but given clinical improvement, severe, probable viral, infectious enteritis is presumed. There is no evidence to suggest inflammatory bowel disease. She improved gradually, is now tolerating a diet and stable for discharge.We discussed the importance of following up for a surveillance colonoscopy as an outpatient.  1. Nausea, vomiting, diarrhea, abdominal pain secondary to presumed infectious enteritis. Resolved. Inflammatory bowel disease considered, however CRP, ESR unremarkable, no blood. GI evaluated, plan outpatient follow-up. 2. Presumed infectious enteritis. Resolved. Laboratory studies unremarkable. No evidence of severe infection or process.  Consultants:  GI  Nutrition  Procedures:  None  Antimicrobials:  Cipro 8/4 >> 8/9   Discharge Instructions  Discharge Instructions    Activity as tolerated -  No restrictions    Complete by:  As directed   Diet general    Complete by:  As directed   Discharge instructions    Complete by:  As directed   Call your physician or seek immediate medical attention for fever, vomiting, poor appetite, abdominal pain, bleeding or worsening of condition.       Medication List    STOP taking these medications   albuterol 108 (90 Base) MCG/ACT inhaler Commonly known as:  PROVENTIL HFA;VENTOLIN HFA   dicyclomine 20 MG tablet Commonly known as:  BENTYL   tretinoin 0.05 % cream Commonly known as:  RETIN-A     TAKE these medications   ALPRAZolam 0.5 MG tablet Commonly known as:  XANAX Take 1 tablet (0.5 mg total) by mouth 2 (two) times daily as needed for anxiety.   buPROPion 150 MG 24 hr tablet Commonly known as:  WELLBUTRIN XL TAKE 1 TABLET BY MOUTH DAILY. What changed:  Another medication with the same name was removed. Continue taking this medication, and follow the directions you see here.   ciprofloxacin 500 MG tablet Commonly known as:  CIPRO Take 1 tablet (500 mg total) by mouth 2 (two) times daily.   ondansetron 4 MG tablet Commonly known as:  ZOFRAN Take 1 tablet (4 mg total) by mouth every 6 (six) hours as needed for nausea. What changed:  when to take this  reasons to take this      No Known Allergies  The results of significant diagnostics from this hospitalization (including imaging, microbiology, ancillary and laboratory) are listed below for reference.    Significant Diagnostic Studies: Ct Abdomen Pelvis W Contrast  Result Date: 09/07/2015 CLINICAL DATA:  50 year old female with abdominal pain nausea and vomiting for 3 days. Initial encounter. EXAM: CT ABDOMEN AND PELVIS WITH CONTRAST TECHNIQUE: Multidetector CT imaging  of the abdomen and pelvis was performed using the standard protocol following bolus administration of intravenous contrast. CONTRAST:  156m ISOVUE-300 IOPAMIDOL (ISOVUE-300) INJECTION 61% COMPARISON:   Chest radiographs 03/20/2014. FINDINGS: Negative lung bases.  No pericardial or pleural effusion. No acute osseous abnormality identified. Moderate volume of pelvic free fluid, with simple fluid densitometry. Uterus is surgically absent. Adnexa are diminutive or absent. Diminutive urinary bladder. Decompressed distal colon. Negative left colon. Negative transverse colon. There is some fluid in the right colon overall the large bowel is decompressed. There is mild wall thickening and enhancement at the cecum. There is gas and fluid in the appendix which is normal by size criteria although there is suggestion of some mild appendiceal enhancement. Thick walled and hyper enhancing small bowel loops dilated with gas and fluid throughout the mid abdomen. The duodenum and proximal jejunum are normal. The small bowel wall thickening progresses distally, and is severe in the distal ileum. The E abnormal small bowel wall thickening continues to the terminal ileum, although the most distal small bowel is less dilated. There is a small volume of interloop mesenteric fluid throughout the abdomen. No free air. Trace free fluid about the liver. Normal liver enhancement. Negative gallbladder, spleen, pancreas and adrenal glands. Portal venous system is patent. Major arterial structures in the abdomen and pelvis are normal. No lymphadenopathy. Bilateral renal enhancement and contrast excretion is normal. IMPRESSION: 1. Thick walled and inflamed small bowel loops throughout the abdomen, with dilated mid jejunum, but gradual tapering and no transition point. The bowel wall inflammation progresses distally and continues to the terminal ileum, where the cecum and appendix also appear mildly involved. Associated free fluid, but no free air. 2. Top differential considerations are Inflammatory Bowel Disease (Crohn disease) and Severe Infectious Enteritis. Electronically Signed   By: HGenevie AnnM.D.   On: 09/07/2015 22:52     Microbiology: Recent Results (from the past 240 hour(s))  Gastrointestinal Panel by PCR , Stool     Status: None   Collection Time: 09/08/15  4:40 PM  Result Value Ref Range Status   Campylobacter species NOT DETECTED NOT DETECTED Final   Plesimonas shigelloides NOT DETECTED NOT DETECTED Final   Salmonella species NOT DETECTED NOT DETECTED Final   Yersinia enterocolitica NOT DETECTED NOT DETECTED Final   Vibrio species NOT DETECTED NOT DETECTED Final   Vibrio cholerae NOT DETECTED NOT DETECTED Final   Enteroaggregative E coli (EAEC) NOT DETECTED NOT DETECTED Final   Enteropathogenic E coli (EPEC) NOT DETECTED NOT DETECTED Final   Enterotoxigenic E coli (ETEC) NOT DETECTED NOT DETECTED Final   Shiga like toxin producing E coli (STEC) NOT DETECTED NOT DETECTED Final   E. coli O157 NOT DETECTED NOT DETECTED Final   Shigella/Enteroinvasive E coli (EIEC) NOT DETECTED NOT DETECTED Final   Cryptosporidium NOT DETECTED NOT DETECTED Final   Cyclospora cayetanensis NOT DETECTED NOT DETECTED Final   Entamoeba histolytica NOT DETECTED NOT DETECTED Final   Giardia lamblia NOT DETECTED NOT DETECTED Final   Adenovirus F40/41 NOT DETECTED NOT DETECTED Final   Astrovirus NOT DETECTED NOT DETECTED Final   Norovirus GI/GII NOT DETECTED NOT DETECTED Final   Rotavirus A NOT DETECTED NOT DETECTED Final   Sapovirus (I, II, IV, and V) NOT DETECTED NOT DETECTED Final     Labs: Basic Metabolic Panel:  Recent Labs Lab 09/06/15 1104 09/07/15 0315 09/07/15 2140 09/09/15 0626  NA 142 139 136 138  K 4.1 3.9 3.6 3.7  CL 104 104 104 105  CO2 26 27 28 24   GLUCOSE 125* 126* 142* 106*  BUN 10 14 14 6   CREATININE 0.90 1.13* 0.93 0.74  CALCIUM 9.9 9.0 8.5* 8.0*   Liver Function Tests:  Recent Labs Lab 09/06/15 1104 09/07/15 0315 09/07/15 2140  AST 20 17 16   ALT 22 17 18   ALKPHOS 63 55 50  BILITOT 0.6 0.7 0.5  PROT 7.5 6.4* 6.7  ALBUMIN 4.4 3.8 3.8    Recent Labs Lab 09/06/15 1104  09/07/15 0315 09/07/15 2140  LIPASE 35 163* 39   CBC:  Recent Labs Lab 09/06/15 1104 09/07/15 0315 09/07/15 2140 09/09/15 0626  WBC 12.3* 9.1 7.2 4.5  HGB 18.2* 17.2* 15.0 13.9  HCT 54.9* 52.3* 46.4* 43.0  MCV 95.8 94.9 94.3 95.3  PLT 287 270 236 200    Principal Problem:   Gastroenteritis Active Problems:   Depression with anxiety   Diarrhea of infectious origin   Abnormal CT of the abdomen   Time coordinating discharge: 20 minutes  Signed:  [name], MD [facility] 09/10/2015, 10:45 AM   By signing my name below, I, Hilbert Odor, attest that this documentation has been prepared under the direction and in the presence of Daniel P. Sarajane Jews, MD. Electronically signed: Hilbert Odor, Scribe.  09/10/15, 10:12 AM  I personally performed the services described in this documentation. All medical record entries made by the scribe were at my direction. I have reviewed the chart and agree that the record reflects my personal performance and is accurate and complete. [name], MD

## 2015-09-10 NOTE — Progress Notes (Signed)
Discharge instructions read to patient and her boyfriend.  Both verbalized understanding of all instructions.  Discharge to home with friend

## 2015-09-10 NOTE — Progress Notes (Signed)
PROGRESS NOTE  Ruth Robertson [id number]:[id number] DOB: April 18, 1965 DOA: 09/07/2015 PCP: Sallee Lange, MD  Brief Narrative: 50 year old woman with nausea, vomiting, diarrhea for several days, seen in the emergency department twice. Admitted for refractory nausea, vomiting, diarrhea and abnormal CT findings of severe enteritis versus inflammatory bowel disease. She continues to improve and is likely to be discharged later today.  Assessment/Plan: 1. Nausea, vomiting, diarrhea, abdominal pain secondary to presumed infectious enteritis. Resolved. Inflammatory bowel disease considered, however CRP, ESR unremarkable, no blood. GI evaluated, plan outpatient follow-up. 2. Presumed infectious enteritis. Resolved. Laboratory studies unremarkable. No evidence of severe infection or process.   Overall improved  Recommend pursuing outpatient colonoscopy  Home today on oral antibiotics, when necessary antiemetics  DVT prophylaxis: SCDs Code Status: Full Family Communication: Family at bedside  Disposition Plan: Anticipate discharge home today  [name], MD  [facility] Direct contact: [phone] --[place] [place]  --[email or web address]; password [email or web address]  7PM-7AM contact night coverage as above 09/10/2015, 6:41 AM  LOS: 0 days   Consultants:  GI  Nutrition  Procedures:  None  Antimicrobials:  Cipro 8/4 >> 8/9  HPI/Subjective: Doing much better today. No vomiting or nausea. Headaches for breakfast. Bloating has decreased. Abdominal pain much decreased. Wants to go home.  Objective: Vitals:   09/09/15 0511 09/09/15 1345 09/09/15 2311 09/10/15 0550  BP: 128/83 130/71 123/79 108/75  Pulse: 73 83 71 69  Resp: _0 Temp: 99 F (37.2 C) 98.4 F (36.9 C) 98.4 F (36.9 C) 98.5 F (36.9 C)  TempSrc: Oral Oral Oral Oral  SpO2: 97% 98% 97% 99%  Weight:      Height:        Intake/Output Summary (Last 24 hours) at 09/10/15 0641 Last data filed at 09/09/15 1800  Gross per 24 hour  Intake             1772 ml  Output                0 ml  Net             1772 ml     Filed Weights   09/07/15 2125 09/08/15 0123  Weight: 65.8 kg (145 lb) 66.6 kg (146 lb 12.8 oz)    Exam:  Constitutional:  . Appears calm and comfortable. Sitting in chair. Appears much better today. Respiratory:  . CTA bilaterally, no w/r/r.  . Respiratory effort normal. No retractions or accessory muscle use Cardiovascular:  . RRR, no m/r/g . No LE extremity edema   Abdomen:  . Soft, mildly distended, nontender.   I have personally reviewed following labs and imaging studies:  No new labs  Scheduled Meds: . bismuth subsalicylate  45 mL Oral TID  . ciprofloxacin  400 mg Intravenous Q12H   Continuous Infusions: . sodium chloride 100 mL/hr at 09/10/15 0103    Principal Problem:   Gastroenteritis Active Problems:   Depression with anxiety   Diarrhea of infectious origin   Abnormal CT of the abdomen   LOS: 0 days   By signing my name below, I, Hilbert Odor, attest that this documentation has been prepared under the direction and in the presence of Daniel P. Sarajane Jews, MD. Electronically signed: Hilbert Odor, Scribe.  09/10/15, 10:04 AM  I personally performed the services described in this documentation. All medical record entries made by the scribe were at my direction. I have reviewed the chart and agree that the record reflects my personal performance and  is accurate and complete. Daniel Goodrich, MD    

## 2015-09-11 ENCOUNTER — Ambulatory Visit: Payer: 59

## 2015-09-11 ENCOUNTER — Encounter: Payer: Self-pay | Admitting: Family Medicine

## 2015-09-11 ENCOUNTER — Ambulatory Visit (INDEPENDENT_AMBULATORY_CARE_PROVIDER_SITE_OTHER): Payer: 59 | Admitting: Family Medicine

## 2015-09-11 ENCOUNTER — Other Ambulatory Visit (HOSPITAL_COMMUNITY)
Admission: AD | Admit: 2015-09-11 | Discharge: 2015-09-11 | Disposition: A | Discharge status: Home / Self Care | Payer: 59 | Source: Ambulatory Visit | Attending: Family Medicine | Admitting: Family Medicine

## 2015-09-11 ENCOUNTER — Ambulatory Visit (HOSPITAL_COMMUNITY)
Admission: RE | Admit: 2015-09-11 | Discharge: 2015-09-11 | Disposition: A | Discharge status: Home / Self Care | Payer: 59 | Source: Ambulatory Visit | Attending: Family Medicine | Admitting: Family Medicine

## 2015-09-11 VITALS — BP 130/78 | Ht 66.0 in | Wt 151.4 lb

## 2015-09-11 DIAGNOSIS — K529 Noninfective gastroenteritis and colitis, unspecified: Secondary | ICD-10-CM | POA: Insufficient documentation

## 2015-09-11 DIAGNOSIS — R109 Unspecified abdominal pain: Secondary | ICD-10-CM | POA: Insufficient documentation

## 2015-09-11 DIAGNOSIS — R197 Diarrhea, unspecified: Secondary | ICD-10-CM | POA: Diagnosis not present

## 2015-09-11 DIAGNOSIS — R140 Abdominal distension (gaseous): Secondary | ICD-10-CM

## 2015-09-11 LAB — CBC WITH DIFFERENTIAL/PLATELET
Basophils Absolute: 0 10*3/uL (ref 0.0–0.1)
Basophils Relative: 1 %
Eosinophils Absolute: 0.2 10*3/uL (ref 0.0–0.7)
Eosinophils Relative: 3 %
HCT: 43.6 % (ref 36.0–46.0)
Hemoglobin: 14.3 g/dL (ref 12.0–15.0)
Lymphocytes Relative: 24 %
Lymphs Abs: 1.8 10*3/uL (ref 0.7–4.0)
MCH: 30.8 pg (ref 26.0–34.0)
MCHC: 32.8 g/dL (ref 30.0–36.0)
MCV: 94 fL (ref 78.0–100.0)
Monocytes Absolute: 0.7 10*3/uL (ref 0.1–1.0)
Monocytes Relative: 9 %
Neutro Abs: 4.7 10*3/uL (ref 1.7–7.7)
Neutrophils Relative %: 63 %
Platelets: 262 10*3/uL (ref 150–400)
RBC: 4.64 MIL/uL (ref 3.87–5.11)
RDW: 13.3 % (ref 11.5–15.5)
WBC: 7.5 10*3/uL (ref 4.0–10.5)

## 2015-09-11 LAB — HEPATIC FUNCTION PANEL
ALT: 45 U/L (ref 14–54)
AST: 39 U/L (ref 15–41)
Albumin: 3.5 g/dL (ref 3.5–5.0)
Alkaline Phosphatase: 42 U/L (ref 38–126)
Bilirubin, Direct: 0.1 mg/dL (ref 0.1–0.5)
Indirect Bilirubin: 0.4 mg/dL (ref 0.3–0.9)
Total Bilirubin: 0.5 mg/dL (ref 0.3–1.2)
Total Protein: 6.5 g/dL (ref 6.5–8.1)

## 2015-09-11 LAB — BASIC METABOLIC PANEL
Anion gap: 5 (ref 5–15)
BUN: 6 mg/dL (ref 6–20)
CO2: 25 mmol/L (ref 22–32)
Calcium: 8 mg/dL — ABNORMAL LOW (ref 8.9–10.3)
Chloride: 108 mmol/L (ref 101–111)
Creatinine, Ser: 0.94 mg/dL (ref 0.44–1.00)
GFR calc Af Amer: >60 mL/min (ref >60)
GFR calc non Af Amer: >60 mL/min (ref >60)
Glucose, Bld: 97 mg/dL (ref 65–99)
Potassium: 3.3 mmol/L — ABNORMAL LOW (ref 3.5–5.1)
Sodium: 138 mmol/L (ref 135–145)

## 2015-09-11 LAB — LIPASE, BLOOD: Lipase: 27 U/L (ref 11–51)

## 2015-09-11 MED ORDER — POTASSIUM CHLORIDE ER 10 MEQ PO TBCR: 10.0000 meq | EXTENDED_RELEASE_TABLET | Freq: Two times a day (BID) | ORAL | 0 refills | Status: DC

## 2015-09-11 NOTE — Progress Notes (Signed)
   Subjective:    Patient ID: Ruth Robertson, female    DOB: [birth year], 50 y.o.   MRN: [id number]  HPI Patient arrives for a follow up from recent hospitalization for vomiting, diarrhea and abdominal swelling. Patients had several days of vomiting diarrhea abdominal swelling discomfort not feeling good started August 2 is somewhat better today but still having abdominal bloating tenderness swelling discomfort denies bloody stools denies high fever chills sweats. She denies any travel other than going to the beach she states she always uses her well water denies any sickness within  Review of Systems  Constitutional: Positive for fatigue. Negative for fever.  HENT: Negative for congestion.   Respiratory: Negative for cough and shortness of breath.   Cardiovascular: Negative for chest pain.  Gastrointestinal: Positive for abdominal pain, diarrhea and nausea.       Objective:   Physical Exam  Constitutional: She appears well-nourished. No distress.  Cardiovascular: Normal rate, regular rhythm and normal heart sounds.   No murmur heard. Pulmonary/Chest: Effort normal and breath sounds normal. No respiratory distress.  Abdominal: She exhibits distension. There is tenderness.  Musculoskeletal: She exhibits no edema.  Lymphadenopathy:    She has no cervical adenopathy.  Neurological: She is alert. She exhibits normal muscle tone.  Psychiatric: Her behavior is normal.  Vitals reviewed.         Assessment & Plan:  Severe lower abdominal midabdominal pain with bloating and swelling-review of hospital records was completed. Recommend stat lab work and stat x-rays. May need gastroenterology to recheck patient this week. Possibly will need referral. I do not feel patient needs admitting at this point.  Blood work shows normal CBC. Potassium is low. In addition to this x-ray shows increased gas but no free air  Patient having significant discomfort with distention not able to  work for at least the next 2 weeks  We will discuss case with gastroenterology to see if they can follow her up sooner patient may well need further testing to rule out inflammatory bowel disease

## 2015-09-12 ENCOUNTER — Telehealth: Payer: Self-pay | Admitting: Internal Medicine

## 2015-09-12 NOTE — Telephone Encounter (Signed)
Dr. Sallee Lange called about this patient. Recently hospitalized. Continues to have abdominal bloating, nausea and diarrhea. Not doing as well as would be expected at this point in time. Dr. Wolfgang Phoenix concerned about undiagnosed entities including Crohn's ileitis.  He requests a semi-urgent follow-up in our office to reassess by the end of the week.

## 2015-09-12 NOTE — Telephone Encounter (Signed)
REVIEWED. AGREE. IF NOT IMPROVING, TCS W/ MAC DUE TO POLYPHARMACY.

## 2015-09-12 NOTE — Telephone Encounter (Signed)
APPOINTMENT MADE FOR Friday AND I CALLED HER WITH DATE AND TIME.

## 2015-09-13 ENCOUNTER — Ambulatory Visit (INDEPENDENT_AMBULATORY_CARE_PROVIDER_SITE_OTHER): Payer: 59 | Admitting: Nurse Practitioner

## 2015-09-13 ENCOUNTER — Other Ambulatory Visit: Payer: Self-pay

## 2015-09-13 ENCOUNTER — Encounter: Payer: Self-pay | Admitting: Nurse Practitioner

## 2015-09-13 DIAGNOSIS — R140 Abdominal distension (gaseous): Secondary | ICD-10-CM

## 2015-09-13 DIAGNOSIS — R197 Diarrhea, unspecified: Secondary | ICD-10-CM

## 2015-09-13 MED ORDER — SOD PICOSULFATE-MAG OX-CIT ACD 10-3.5-12 MG-GM-GM PO PACK: 1.0000 | PACK | ORAL | 0 refills | Status: DC

## 2015-09-13 MED FILL — PREPOPIK POWDER PACKET: 10-3.5-12 | 1 days supply | Qty: 2 | Fill #0

## 2015-09-13 NOTE — Progress Notes (Signed)
CC'ED TO PCP 

## 2015-09-13 NOTE — Telephone Encounter (Signed)
Patient seen, TCS scheduled. See OV note for details

## 2015-09-13 NOTE — Assessment & Plan Note (Addendum)
Noted abdominal bloating and abdominal tenderness with continued diarrhea for plus watery stools a day. CT concerning for Crohn's disease versus gastroenteritis. Overall somewhat improved, but does continue with significant symptoms. However, without nausea, vomiting, more significant abdominal pain unlikely stricturing or obstruction. Discussed the need to do a colonoscopy to further evaluate terminal ileum for possible Crohn's disease. She is agreeable to proceed, see details below.

## 2015-09-13 NOTE — Patient Instructions (Signed)
1. We will schedule your procedure for you. 2. We will request her previous colonoscopy report from [facility] gastroenterology. 3. Return for follow-up based on recommendations made after your procedure by Dr. Oneida Alar. 4. If symptoms become significantly worse including uncontrollable nausea and vomiting, weakness, dizziness, passing out, chest pain, severely short of breath call our office or proceed to the emergency room.

## 2015-09-13 NOTE — Progress Notes (Signed)
Referring Provider: Kathyrn Drown, MD Primary Care Physician:  Sallee Lange, MD Primary GI:  Dr.   Chief Complaint  Patient presents with  . Nausea    approx 1 week onset  . Diarrhea  . Bloated    HPI:   Ruth Robertson is a 50 y.o. female who presents for follow-up on diarrhea. Patient was recently admitted to the hospital on 09/07/2015 and discharged on 09/10/2015. She is admitted for nausea, vomiting, diarrhea secondary to presumed infectious enteritis. She presented to the emergency department with the above symptoms and had been seen twice previously in the ER for the same, she was admitted for refractory nausea, vomiting, diarrhea and abnormal CT of severe enteritis versus inflammatory bowel disease. GI pathogen panel negative, empiric anabolic sided, ESR and CRP were unremarkable. She clinically improved and was deemed likely severe probable viral infectious enteritis without evidence to suggest inflammatory bowel disease. There is a recommendation for outpatient follow-up with GI including outpatient colonoscopy.  We will received a phone call from the patient's PCP stating she was continuing to have abdominal bloating, nausea, diarrhea and not improving to the level he would expect at this point. Requested urgent evaluation with concern for possible Crohn's ileitis.  Today she states she is somewhat better overall. However, still with abdominal bloating and diarrhea. Has about 4-6 loose stools a day. Denies hematochezia. Stools darkened, on Pepto. Denies N/V. Admits abdominal pain, generalized and described as pressure. Denies chest pain, dyspnea, dizziness, lightheadedness, syncope, near syncope. Denies any other upper or lower GI symptoms.  Last colonoscopy at [place] 6 years ago, recommended 5 year repeat exam.   Past Medical History:  Diagnosis Date  . Asthma    ONLY WITH BRONCHITIS  . GAD (generalized anxiety disorder)   . Pneumonia   . PONV (postoperative  nausea and vomiting)     Past Surgical History:  Procedure Laterality Date  . HYSTEROSCOPY  2006  . SALPINGOOPHORECTOMY Bilateral 08/18/2012   Procedure: SALPINGO OOPHORECTOMY;  Surgeon: Anastasio Auerbach, MD;  Location: [facility];  Service: Gynecology;  Laterality: Bilateral;  . VAGINAL HYSTERECTOMY N/A 08/18/2012   Procedure: HYSTERECTOMY VAGINAL;  Surgeon: Anastasio Auerbach, MD;  Location: [facility];  Service: Gynecology;  Laterality: N/A;  . VAGINAL HYSTERECTOMY     Vag.Hyst BSO    Current Outpatient Prescriptions  Medication Sig Dispense Refill  . ALPRAZolam (XANAX) 0.5 MG tablet Take 1 tablet (0.5 mg total) by mouth 2 (two) times daily as needed for anxiety. 30 tablet 3  . buPROPion (WELLBUTRIN XL) 150 MG 24 hr tablet TAKE 1 TABLET BY MOUTH DAILY. 30 tablet 5  . ciprofloxacin (CIPRO) 500 MG tablet Take 1 tablet (500 mg total) by mouth 2 (two) times daily. 7 tablet 0  . ondansetron (ZOFRAN) 4 MG tablet Take 1 tablet (4 mg total) by mouth every 6 (six) hours as needed for nausea. 20 tablet 0  . potassium chloride (K-DUR) 10 MEQ tablet Take 1 tablet (10 mEq total) by mouth 2 (two) times daily. 14 tablet 0   No current facility-administered medications for this visit.     Allergies as of 09/13/2015  . (No Known Allergies)    Family History  Problem Relation Age of Onset  . Heart failure Father   . Stroke Father   . Breast cancer Maternal Aunt     50's  . Cancer Maternal Aunt 36    colon cancer  . Diabetes Paternal Grandfather   . Hypertension  Maternal Grandmother   . Colon cancer Maternal Aunt   . Heart attack Mother     Social History   Social History  . Marital status: Divorced    Spouse name: N/A  . Number of children: N/A  . Years of education: N/A   Social History Main Topics  . Smoking status: Current Every Day Smoker    Packs/day: 0.25    Years: 25.00    Types: Cigarettes  . Smokeless tobacco: Never Used  . Alcohol use 0.0 oz/week     Comment: occassionaly   . Drug use: No  . Sexual activity: Yes    Partners: Male     Comment: HYST-1st intercourse 28 yo-5 partners   Other Topics Concern  . None   Social History Narrative  . None    Review of Systems: 10-point ROS negative except as per HPI.   Physical Exam: BP 104/76   Pulse 77   Temp 97.8 F (36.6 C) (Oral)   Ht _0  (1.676 m)   Wt 147 lb (66.7 kg)   LMP 06/12/2012   BMI 23.73 kg/m  General:   Alert and oriented. Pleasant and cooperative. Well-nourished and well-developed. Appears not feeling well, non-toxic Head:  Normocephalic and atraumatic. Eyes:  Without icterus, sclera clear and conjunctiva pink.  Ears:  Normal auditory acuity. Cardiovascular:  S1, S2 present without murmurs appreciated. Extremities without clubbing or edema. Respiratory:  Clear to auscultation bilaterally. No wheezes, rales, or rhonchi. No distress.  Gastrointestinal:  +BS, soft. Mildly distended/bloaed with mild TTP generalized abdomen. No HSM noted. No guarding or rebound. No masses appreciated.  Rectal:  Deferred  Musculoskalatal:  Symmetrical without gross deformities. Neurologic:  Alert and oriented x4;  grossly normal neurologically. Psych:  Alert and cooperative. Normal mood and affect. Heme/Lymph/Immune: No excessive bruising noted.    09/13/2015 1:53 PM   Disclaimer: This note was dictated with voice recognition software. Similar sounding words can inadvertently be transcribed and may not be corrected upon review.

## 2015-09-13 NOTE — Assessment & Plan Note (Signed)
She recently admitted with likely severe viral gastroenteritis and associated diarrhea, nausea, vomiting, abdominal pain. She improved somewhat since discharge from the hospital but continues with diarrhea about 4 or more loose, watery, nonbloody stools a day. CT was concerning for gastroenteritis versus Crohn's ileitis with inflammation noted in the jejunum to ileum. Being that she has not improving more significantly we'll proceed with colonoscopy to further evaluate.  Proceed with colonoscopy with Dr. Oneida Alar in the near future. The risks, benefits, and alternatives have been discussed in detail with the patient. They state understanding and desire to proceed.   We will schedule the procedure on propofol/MAC to promote adequate sedation.

## 2015-09-14 ENCOUNTER — Telehealth: Payer: Self-pay | Admitting: Family Medicine

## 2015-09-14 ENCOUNTER — Encounter (HOSPITAL_COMMUNITY): Payer: Self-pay

## 2015-09-14 ENCOUNTER — Encounter: Payer: Self-pay | Admitting: Internal Medicine

## 2015-09-14 DIAGNOSIS — Z79899 Other long term (current) drug therapy: Secondary | ICD-10-CM

## 2015-09-14 DIAGNOSIS — Z0289 Encounter for other administrative examinations: Secondary | ICD-10-CM

## 2015-09-14 MED FILL — BUPROPION HCL XL 150 MG TAB: 150 | 30 days supply | Qty: 30 | Fill #1

## 2015-09-14 NOTE — Telephone Encounter (Signed)
l °

## 2015-09-14 NOTE — Telephone Encounter (Signed)
1- I doubt she needs to be on any further antibiotic-she saw GI this week- let pt know we will touch base with Gi-Gi note did not mention staying on antibiotic  2- please let me speak with Walden Field with [facility] 3- continue Potassium 41mq one bid for next 10 days ( at least till diarrhea ceases) send in 20 with 1 refill 4- check met 7 to recheck K do the lab Teusday of next week

## 2015-09-14 NOTE — Telephone Encounter (Signed)
Pt is checking on the status of this message? She has called here two times. She is really stressed about not having taken a antibiotic today.  Please advise

## 2015-09-14 NOTE — Telephone Encounter (Signed)
Patient wanting to know if she needs to continue potassium chloride 10 mg she states took last pill in pm on 8/9. She uses [facility].

## 2015-09-15 ENCOUNTER — Ambulatory Visit: Payer: 59 | Admitting: Gastroenterology

## 2015-09-15 ENCOUNTER — Encounter (HOSPITAL_COMMUNITY)
Admission: RE | Admit: 2015-09-15 | Discharge: 2015-09-15 | Disposition: A | Discharge status: Home / Self Care | Payer: 59 | Source: Ambulatory Visit | Attending: Gastroenterology | Admitting: Gastroenterology

## 2015-09-15 DIAGNOSIS — Z0289 Encounter for other administrative examinations: Secondary | ICD-10-CM

## 2015-09-15 HISTORY — DX: Anxiety disorder, unspecified: F41.9

## 2015-09-15 MED ORDER — POTASSIUM CHLORIDE ER 10 MEQ PO TBCR: 10.0000 meq | EXTENDED_RELEASE_TABLET | Freq: Two times a day (BID) | ORAL | 1 refills | Status: DC

## 2015-09-15 MED FILL — KLOR-CON M10 TABLET: 10 | 10 days supply | Qty: 20 | Fill #0

## 2015-09-15 NOTE — Telephone Encounter (Signed)
Patient was told during earlier phone call no further antibiotics was needed. She understood.

## 2015-09-15 NOTE — Telephone Encounter (Signed)
I did speak with E Gill from GI office. No further antibiotics recommended. Please make sure patient is aware of this. They will be doing a colonoscopy in the near future.

## 2015-09-15 NOTE — Telephone Encounter (Signed)
Called [facility] and receptionist will have Walden Field paged to return Dr. Bary Leriche call

## 2015-09-15 NOTE — Telephone Encounter (Signed)
Notified patient doubt she needs to be on any further antibiotic-she saw GI this week- let pt know we will touch base with Gi-Gi note did not mention staying on antibiotic, continue Potassium 13mq one bid for next 10 days ( at least till diarrhea ceases) send in 20 with 1 refill, check met 7 to recheck K do the lab Tuesday of next week. Patient verbalized understanding.

## 2015-09-18 ENCOUNTER — Telehealth: Payer: Self-pay | Admitting: Family Medicine

## 2015-09-18 DIAGNOSIS — Z79899 Other long term (current) drug therapy: Secondary | ICD-10-CM | POA: Diagnosis not present

## 2015-09-18 NOTE — Telephone Encounter (Signed)
Patient said that someone called her from our office requesting she have blood work done.  She just wants to verify if she is supposed to have this done at the hospital or at the lab next door to [place]?

## 2015-09-18 NOTE — Telephone Encounter (Signed)
Patient notified to report to [place].

## 2015-09-19 ENCOUNTER — Ambulatory Visit (HOSPITAL_COMMUNITY)
Admission: RE | Admit: 2015-09-19 | Discharge: 2015-09-19 | Disposition: A | Discharge status: Home / Self Care | Payer: 59 | Source: Ambulatory Visit | Attending: Gastroenterology | Admitting: Gastroenterology

## 2015-09-19 ENCOUNTER — Encounter (HOSPITAL_COMMUNITY): Payer: Self-pay | Admitting: *Deleted

## 2015-09-19 ENCOUNTER — Ambulatory Visit (HOSPITAL_COMMUNITY): Payer: 59 | Admitting: Anesthesiology

## 2015-09-19 ENCOUNTER — Encounter (HOSPITAL_COMMUNITY)
Admission: RE | Disposition: A | Discharge status: Home / Self Care | Payer: Self-pay | Source: Ambulatory Visit | Attending: Gastroenterology

## 2015-09-19 DIAGNOSIS — R197 Diarrhea, unspecified: Secondary | ICD-10-CM | POA: Diagnosis not present

## 2015-09-19 DIAGNOSIS — R140 Abdominal distension (gaseous): Secondary | ICD-10-CM

## 2015-09-19 DIAGNOSIS — K5730 Diverticulosis of large intestine without perforation or abscess without bleeding: Secondary | ICD-10-CM | POA: Diagnosis not present

## 2015-09-19 DIAGNOSIS — K635 Polyp of colon: Secondary | ICD-10-CM | POA: Diagnosis not present

## 2015-09-19 DIAGNOSIS — F17210 Nicotine dependence, cigarettes, uncomplicated: Secondary | ICD-10-CM | POA: Diagnosis not present

## 2015-09-19 DIAGNOSIS — F419 Anxiety disorder, unspecified: Secondary | ICD-10-CM | POA: Diagnosis not present

## 2015-09-19 DIAGNOSIS — K621 Rectal polyp: Secondary | ICD-10-CM | POA: Diagnosis not present

## 2015-09-19 DIAGNOSIS — R109 Unspecified abdominal pain: Secondary | ICD-10-CM | POA: Insufficient documentation

## 2015-09-19 DIAGNOSIS — K648 Other hemorrhoids: Secondary | ICD-10-CM | POA: Insufficient documentation

## 2015-09-19 DIAGNOSIS — Z800 Family history of malignant neoplasm of digestive organs: Secondary | ICD-10-CM | POA: Insufficient documentation

## 2015-09-19 DIAGNOSIS — R933 Abnormal findings on diagnostic imaging of other parts of digestive tract: Secondary | ICD-10-CM | POA: Diagnosis not present

## 2015-09-19 DIAGNOSIS — D124 Benign neoplasm of descending colon: Secondary | ICD-10-CM | POA: Diagnosis not present

## 2015-09-19 HISTORY — PX: POLYPECTOMY: SHX5525

## 2015-09-19 HISTORY — PX: COLONOSCOPY WITH PROPOFOL: SHX5780

## 2015-09-19 HISTORY — PX: BIOPSY: SHX5522

## 2015-09-19 LAB — BASIC METABOLIC PANEL
BUN/Creatinine Ratio: 15 (ref 9–23)
BUN: 10 mg/dL (ref 6–24)
CO2: 25 mmol/L (ref 18–29)
Calcium: 9.2 mg/dL (ref 8.7–10.2)
Chloride: 103 mmol/L (ref 96–106)
Creatinine, Ser: 0.65 mg/dL (ref 0.57–1.00)
GFR calc Af Amer: 121 mL/min/{1.73_m2} (ref >59)
GFR calc non Af Amer: 105 mL/min/{1.73_m2} (ref >59)
Glucose: 88 mg/dL (ref 65–99)
Potassium: 4.2 mmol/L (ref 3.5–5.2)
Sodium: 144 mmol/L (ref 134–144)

## 2015-09-19 SURGERY — COLONOSCOPY WITH PROPOFOL
Anesthesia: Monitor Anesthesia Care

## 2015-09-19 MED ORDER — ONDANSETRON HCL 4 MG/2ML IJ SOLN
4.0000 mg | Freq: Once | INTRAMUSCULAR | Status: AC
  Administered 2015-09-19: 4 mg via INTRAVENOUS

## 2015-09-19 MED ORDER — MIDAZOLAM HCL 2 MG/2ML IJ SOLN
INTRAMUSCULAR | Status: AC
  Filled 2015-09-19: qty 2

## 2015-09-19 MED ORDER — PROPOFOL 500 MG/50ML IV EMUL
INTRAVENOUS | Status: DC | PRN
  Administered 2015-09-19: 14:00:00 via INTRAVENOUS
  Administered 2015-09-19: 125 ug/kg/min via INTRAVENOUS

## 2015-09-19 MED ORDER — DEXAMETHASONE SODIUM PHOSPHATE 4 MG/ML IJ SOLN
4.0000 mg | INTRAMUSCULAR | Status: AC
  Administered 2015-09-19: 4 mg via INTRAVENOUS

## 2015-09-19 MED ORDER — STERILE WATER FOR IRRIGATION IR SOLN
Status: DC | PRN
  Administered 2015-09-19: 100 mL

## 2015-09-19 MED ORDER — FENTANYL CITRATE (PF) 100 MCG/2ML IJ SOLN
25.0000 ug | INTRAMUSCULAR | Status: DC | PRN
  Administered 2015-09-19: 25 ug via INTRAVENOUS

## 2015-09-19 MED ORDER — PROPOFOL 10 MG/ML IV BOLUS
INTRAVENOUS | Status: AC
  Filled 2015-09-19: qty 20

## 2015-09-19 MED ORDER — LACTATED RINGERS IV SOLN
INTRAVENOUS | Status: DC
  Administered 2015-09-19: 12:00:00 via INTRAVENOUS

## 2015-09-19 MED ORDER — ELUXADOLINE 75 MG PO TABS: 75.0000 mg | ORAL_TABLET | Freq: Two times a day (BID) | ORAL | 5 refills | Status: DC

## 2015-09-19 MED ORDER — MIDAZOLAM HCL 5 MG/5ML IJ SOLN
INTRAMUSCULAR | Status: DC | PRN
  Administered 2015-09-19 (×2): 2 mg via INTRAVENOUS

## 2015-09-19 MED ORDER — DEXAMETHASONE SODIUM PHOSPHATE 4 MG/ML IJ SOLN
INTRAMUSCULAR | Status: AC
  Filled 2015-09-19: qty 1

## 2015-09-19 MED ORDER — MIDAZOLAM HCL 2 MG/2ML IJ SOLN
1.0000 mg | INTRAMUSCULAR | Status: DC | PRN
  Administered 2015-09-19: 2 mg via INTRAVENOUS

## 2015-09-19 MED ORDER — FENTANYL CITRATE (PF) 100 MCG/2ML IJ SOLN
INTRAMUSCULAR | Status: AC
  Filled 2015-09-19: qty 2

## 2015-09-19 MED ORDER — CHLORHEXIDINE GLUCONATE CLOTH 2 % EX PADS: 6.0000 | MEDICATED_PAD | Freq: Once | CUTANEOUS | Status: DC

## 2015-09-19 MED ORDER — ONDANSETRON HCL 4 MG/2ML IJ SOLN
INTRAMUSCULAR | Status: AC
  Filled 2015-09-19: qty 2

## 2015-09-19 MED ORDER — PROPOFOL 10 MG/ML IV BOLUS
INTRAVENOUS | Status: AC
  Filled 2015-09-19: qty 40

## 2015-09-19 MED ORDER — HYDROMORPHONE HCL 1 MG/ML IJ SOLN: 0.2500 mg | INTRAMUSCULAR | Status: DC | PRN

## 2015-09-19 NOTE — Op Note (Signed)
[facility] Patient Name: Ruth Robertson Procedure Date: 09/19/2015 12:44 PM MRN: [id number] Date of Birth: 09/22/1965 Attending MD: Barney Drain , MD CSN: [id number] Age: 50 Admit Type: Ambulatory Procedure:                Colonoscopy with COLD FORCEPS POLYPECTOMG/RANDOM                            COLON BIOPSIES Indications:              Abdominal pain, Clinically significant diarrhea of                            unexplained origin Providers:                Barney Drain, MD, Rosina Lowenstein, RN, Isabella Stalling,                            Technician Referring MD:             Elayne Snare. Luking Medicines:                Propofol per Anesthesia Complications:            No immediate complications. Estimated Blood Loss:     Estimated blood loss was minimal. Procedure:                Pre-Anesthesia Assessment:                           - Prior to the procedure, a History and Physical                            was performed, and patient medications and                            allergies were reviewed. The patient's tolerance of                            previous anesthesia was also reviewed. The risks                            and benefits of the procedure and the sedation                            options and risks were discussed with the patient.                            All questions were answered, and informed consent                            was obtained. Prior Anticoagulants: The patient has                            taken no previous anticoagulant or antiplatelet                            agents. ASA Grade  Assessment: II - A patient with                            mild systemic disease. After reviewing the risks                            and benefits, the patient was deemed in                            satisfactory condition to undergo the procedure.                           After obtaining informed consent, the colonoscope                            was  passed under direct vision. Throughout the                            procedure, the patient's blood pressure, pulse, and                            oxygen saturations were monitored continuously. The                            EC-[id number]) scope was introduced through                            the anus and advanced to the 20 cm into the ileum.                            The terminal ileum, ileocecal valve, appendiceal                            orifice, and rectum were photographed. The                            colonoscopy was somewhat difficult due to a                            tortuous colon. Successful completion of the                            procedure was aided by COLOWRAP. The patient                            tolerated the procedure fairly well. The quality of                            the bowel preparation was excellent. Scope In: 1:38:54 PM Scope Out: 2:00:44 PM Scope Withdrawal Time: 0 hours 17 minutes 48 seconds  Total Procedure Duration: 0 hours 21 minutes 50 seconds  Findings:      The terminal ileum appeared normal.      Five sessile polyps were found in the rectum and descending  colon. The       polyps were 2 to 3 mm in size. These polyps were removed with a cold       biopsy forceps. Resection and retrieval were complete.      The exam was otherwise without abnormality. RANDOM COLD FORCEPS BIOPSIES       OBTAINED TO EVALUATE FOR MICROSCOPIC COLITIS      Non-bleeding internal hemorrhoids were found. The hemorrhoids were small.      A few small and large-mouthed diverticula were found in the sigmoid       colon and transverse colon. Impression:               - The examined portion of the ileum was normal.                           - Five 2 to 3 mm polyps in the rectum and in the                            descending colon, removed with a cold biopsy                            forceps. Resected and retrieved.                           - The examination was  otherwise normal. 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                           - Diverticulosis in the sigmoid colon and in the                            transverse colon. Moderate Sedation:      Moderate (conscious) sedation was personally administered by an       anesthesia professional. Recommendation:           - High fiber/DAIRY FREE diet.                           - Continue present medications.                           - Await pathology results.                           -ADD VIBERZI ONE EVERY OR EVERY OTHER DAY, UP TO                            TWICE DAILY TO PREVENT ABDOMINAL PAIN AND LOOSE  STOOLS. SIDE EFFECTS REVIEWED WITH PT AND HUSBAND.                           - Repeat colonoscopy in 5-10 years for surveillance.                           - Return to my office in 3 months.                           - Patient has a contact number available for                            emergencies. The signs and symptoms of potential                            delayed complications were discussed with the                            patient. Return to normal activities tomorrow.                            Written discharge instructions were provided to the                            patient. Procedure Code(s):        --- Professional ---                           [phone], Colonoscopy, flexible; with biopsy, single                            or multiple Diagnosis Code(s):        --- Professional ---                           K64.8, Other hemorrhoids                           K62.1, Rectal polyp                           D12.4, Benign neoplasm of descending colon                           R10.9, Unspecified abdominal pain                           R19.7, Diarrhea, unspecified                            K57.30, Diverticulosis of large intestine without                            perforation or abscess without bleeding CPT copyright 2016 American [employer] Association. All rights reserved. The codes documented in this report are preliminary and upon coder review may  be revised to meet current compliance  requirements. Barney Drain, MD Barney Drain, MD 09/19/2015 2:15:50 PM This report has been signed electronically. Number of Addenda: 0

## 2015-09-19 NOTE — Discharge Instructions (Signed)
You have SMALL linternal hemorrhoids. YOU HAVE diverticulosis IN YOUR LEFT COLON. YOU HAD FIVE SMALL POLYPS REMOVED. THE LAST PART OF YOU SMALL BOWEL IS NORMAL.   STOP SMOKING. IT WILL INCREASE YOUR RISK OF COLON CANCER.  DRINK WATER TO KEEP YOUR URINE LIGHT YELLOW.  FOLLOW A HIGH FIBER/DAIRY FREE DIET. AVOID ITEMS THAT CAUSE BLOATING. See info below.  TRY VIBERZI. TAKE ONE TABLET DAILY OR EVERY OTHER DAY FOR 7 DAYS TO CONTROL DIARRHEA. IF YOUR DIARRHEA IMPROVES BUT DOES NOT RESOLVE AFTER 7 DAYS, THEN TAKE VIBERZI TWICE DAILY. HOLD IF YOU DON'T HAVE A BM IN A 24 HR PERIOD OF TIME. USE ONLY IMODIUM 1 OR 2 TIMES A DAY IF NEEDED TO CONTROL LOOSE STOOLS. YOU MUST CALL IF YOU GO MORE THAN 4 DAYS WITHOUT A BOWEL MOVEMENT OR YOU DEVELOP INCREASE IN NAUSEA, VOMITING, OR ABDOMINAL PAIN.  USE PREPARATION H CREAM FOUR TIMES  A DAY IF NEEDED TO RELIEVE RECTAL PAIN/PRESSURE/BLEEDING.   YOUR BIOPSY RESULTS WILL BE AVAILABLE IN MY CHART  AUG 18 AND AND MY OFFICE WILL CONTACT YOU IN 10-14 DAYS WITH YOUR RESULTS.   Next colonoscopy in 5-10 years.  Colonoscopy Care After Read the instructions outlined below and refer to this sheet in the next week. These discharge instructions provide you with general information on caring for yourself after you leave the hospital. While your treatment has been planned according to the most current medical practices available, unavoidable complications occasionally occur. If you have any problems or questions after discharge, call DR. Tiona Ruane, [phone].  ACTIVITY  You may resume your regular activity, but move at a slower pace for the next 24 hours.   Take frequent rest periods for the next 24 hours.   Walking will help get rid of the air and reduce the bloated feeling in your belly (abdomen).   No driving for 24 hours (because of the medicine (anesthesia) used during the test).   You may shower.   Do not sign any important legal documents or operate any machinery  for 24 hours (because of the anesthesia used during the test).    NUTRITION  Drink plenty of fluids.   You may resume your normal diet as instructed by your doctor.   Begin with a light meal and progress to your normal diet. Heavy or fried foods are harder to digest and may make you feel sick to your stomach (nauseated).   Avoid alcoholic beverages for 24 hours or as instructed.    MEDICATIONS  You may resume your normal medications.   WHAT YOU CAN EXPECT TODAY  Some feelings of bloating in the abdomen.   Passage of more gas than usual.   Spotting of blood in your stool or on the toilet paper  .  IF YOU HAD POLYPS REMOVED DURING THE COLONOSCOPY:  Eat a soft diet IF YOU HAVE NAUSEA, BLOATING, ABDOMINAL PAIN, OR VOMITING.    FINDING OUT THE RESULTS OF YOUR TEST Not all test results are available during your visit. DR. Oneida Alar WILL CALL YOU WITHIN 14 DAYS OF YOUR PROCEDUE WITH YOUR RESULTS. Do not assume everything is normal if you have not heard from DR. Orian Figueira, CALL HER OFFICE AT [phone].  SEEK IMMEDIATE MEDICAL ATTENTION AND CALL THE OFFICE: [phone] IF:  You have more than a spotting of blood in your stool.   Your belly is swollen (abdominal distention).   You are nauseated or vomiting.   You have a temperature over 101F.   You have abdominal  pain or discomfort that is severe or gets worse throughout the day.  High-Fiber Diet A high-fiber diet changes your normal diet to include more whole grains, legumes, fruits, and vegetables. Changes in the diet involve replacing refined carbohydrates with unrefined foods. The calorie level of the diet is essentially unchanged. The Dietary Reference Intake (recommended amount) for adult males is 38 grams per day. For adult females, it is 25 grams per day. Pregnant and lactating women should consume 28 grams of fiber per day. Fiber is the intact part of a plant that is not broken down during digestion. Functional fiber is  fiber that has been isolated from the plant to provide a beneficial effect in the body. PURPOSE  Increase stool bulk.   Ease and regulate bowel movements.   Lower cholesterol.  REDUCE RISK OF COLON CANCER  INDICATIONS THAT YOU NEED MORE FIBER  Constipation and hemorrhoids.   Uncomplicated diverticulosis (intestine condition) and irritable bowel syndrome.   Weight management.   As a protective measure against hardening of the arteries (atherosclerosis), diabetes, and cancer.   GUIDELINES FOR INCREASING FIBER IN THE DIET  Start adding fiber to the diet slowly. A gradual increase of about 5 more grams (2 slices of whole-wheat bread, 2 servings of most fruits or vegetables, or 1 bowl of high-fiber cereal) per day is best. Too rapid an increase in fiber may result in constipation, flatulence, and bloating.   Drink enough water and fluids to keep your urine clear or pale yellow. Water, juice, or caffeine-free drinks are recommended. Not drinking enough fluid may cause constipation.   Eat a variety of high-fiber foods rather than one type of fiber.   Try to increase your intake of fiber through using high-fiber foods rather than fiber pills or supplements that contain small amounts of fiber.   The goal is to change the types of food eaten. Do not supplement your present diet with high-fiber foods, but replace foods in your present diet.   INCLUDE A VARIETY OF FIBER SOURCES  Replace refined and processed grains with whole grains, canned fruits with fresh fruits, and incorporate other fiber sources. White rice, white breads, and most bakery goods contain little or no fiber.   Brown whole-grain rice, buckwheat oats, and many fruits and vegetables are all good sources of fiber. These include: broccoli, Brussels sprouts, cabbage, cauliflower, beets, sweet potatoes, white potatoes (skin on), carrots, tomatoes, eggplant, squash, berries, fresh fruits, and dried fruits.   Cereals appear to be  the richest source of fiber. Cereal fiber is found in whole grains and bran. Bran is the fiber-rich outer coat of cereal grain, which is largely removed in refining. In whole-grain cereals, the bran remains. In breakfast cereals, the largest amount of fiber is found in those with "bran" in their names. The fiber content is sometimes indicated on the label.   You may need to include additional fruits and vegetables each day.   In baking, for 1 cup white flour, you may use the following substitutions:   1 cup whole-wheat flour minus 2 tablespoons.   1/2 cup white flour plus 1/2 cup whole-wheat flour.    Lactose Free Diet Lactose is a carbohydrate that is found mainly in milk and milk products, as well as in foods with added milk or whey. Lactose must be digested by the enzyme in order to be used by the body. Lactose intolerance occurs when there is a shortage of lactase. When your body is not able to digest lactose,  you may feel sick to your stomach (nausea), bloating, cramping, gas and diarrhea.  There are many dairy products that may be tolerated better than milk by some people:  The use of cultured dairy products such as yogurt, buttermilk, cottage cheese, and sweet acidophilus milk (Kefir) for lactase-deficient individuals is usually well tolerated. This is because the healthy bacteria help digest lactose.   Lactose-hydrolyzed milk (Lactaid) contains 40-90% less lactose than milk and may also be well tolerated.    SPECIAL NOTES  Lactose is a carbohydrates. The major food source is dairy products. Reading food labels is important. Many products contain lactose even when they are not made from milk. Look for the following words: whey, milk solids, dry milk solids, nonfat dry milk powder. Typical sources of lactose other than dairy products include breads, candies, cold cuts, prepared and processed foods, and commercial sauces and gravies.   All foods must be prepared without milk, cream, or  other dairy foods.   Soy milk and lactose-free supplements (LACTASE) may be used as an alternative to milk.   FOOD GROUP ALLOWED/RECOMMENDED AVOID/USE SPARINGLY  BREADS / STARCHES 4 servings or more* Breads and rolls made without milk. [place], [place], or [place] bread. Breads and rolls that contain milk. Prepared mixes such as muffins, biscuits, waffles, pancakes. Sweet rolls, donuts, [place] toast (if made with milk or lactose).  Crackers: Soda crackers, graham crackers. Any crackers prepared without lactose. Zwieback crackers, corn curls, or any that contain lactose.  Cereals: Cooked or dry cereals prepared without lactose (read labels). Cooked or dry cereals prepared with lactose (read labels). Total, Cocoa Krispies. Special K.  Potatoes / Pasta / Rice: Any prepared without milk or lactose. Popcorn. Instant potatoes, frozen [place] fries, scalloped or au gratin potatoes.  VEGETABLES 2 servings or more Fresh, frozen, and canned vegetables. Creamed or breaded vegetables. Vegetables in a cheese sauce or with lactose-containing margarines.  FRUIT 2 servings or more All fresh, canned, or frozen fruits that are not processed with lactose. Any canned or frozen fruits processed with lactose.  MEAT & SUBSTITUTES 2 servings or more (4 to 6 oz. total per day) Plain beef, chicken, fish, [place], lamb, veal, pork, or ham. Kosher prepared meat products. Strained or junior meats that do not contain milk. Eggs, soy meat substitutes, nuts. Scrambled eggs, omelets, and souffles that contain milk. Creamed or breaded meat, fish, or fowl. Sausage products such as wieners, liver sausage, or cold cuts that contain milk solids. Cheese, cottage cheese, or cheese spreads.  MILK None. (See BEVERAGES for milk substitutes. See DESSERTS for ice cream and frozen desserts.) Milk (whole, 2%, skim, or chocolate). Evaporated, powdered, or condensed milk; malted milk.  SOUPS & COMBINATION FOODS Bouillon, broth, vegetable  soups, clear soups, consomms. Homemade soups made with allowed ingredients. Combination or prepared foods that do not contain milk or milk products (read labels). Cream soups, chowders, commercially prepared soups containing lactose. Macaroni and cheese, pizza. Combination or prepared foods that contain milk or milk products.  DESSERTS & SWEETS In moderation Water and fruit ices; gelatin; angel food cake. Homemade cookies, pies, or cakes made from allowed ingredients. Pudding (if made with water or a milk substitute). Lactose-free tofu desserts. Sugar, honey, corn syrup, jam, jelly; marmalade; molasses (beet sugar); Pure sugar candy; marshmallows. Ice cream, ice milk, sherbet, custard, pudding, frozen yogurt. Commercial cake and cookie mixes. Desserts that contain chocolate. Pie crust made with milk-containing margarine; reduced-calorie desserts made with a sugar substitute that contains lactose. Toffee, peppermint, butterscotch, chocolate,  caramels.  FATS & OILS In moderation Butter (as tolerated; contains very small amounts of lactose). Margarines and dressings that do not contain milk, Vegetable oils, shortening, Miracle Whip, mayonnaise, nondairy cream & whipped toppings without lactose or milk solids added (examples: Coffee Rich, Carnation Coffeemate, Rich's Whipped Topping, PolyRich). Berniece Salines. Margarines and salad dressings containing milk; cream, cream cheese; peanut butter with added milk solids, sour cream, chip dips, made with sour cream.  BEVERAGES Carbonated drinks; tea; coffee and freeze-dried coffee; some instant coffees (check labels). Fruit drinks; fruit and vegetable juice; Rice or Soy milk. Ovaltine, hot chocolate. Some cocoas; some instant coffees; instant iced teas; powdered fruit drinks (read labels).   CONDIMENTS / MISCELLANEOUS Soy sauce, carob powder, olives, gravy made with water, baker's cocoa, pickles, pure seasonings and spices, wine, pure monosodium glutamate, catsup, mustard.  Some chewing gums, chocolate, some cocoas. Certain antibiotics and vitamin / mineral preparations. Spice blends if they contain milk products. MSG extender. Artificial sweeteners that contain lactose such as Equal (Nutra-Sweet) and Sweet 'n Low. Some nondairy creamers (read labels).   SAMPLE MENU*  Breakfast   Orange Juice.  Banana.   Bran flakes.   Nondairy Creamer.  [place] Bread (toasted).   Butter or milk-free margarine.   Coffee or tea.    Noon Meal   Chicken Breast.  Rice.   Green beans.   Butter or milk-free margarine.  Fresh melon.   Coffee or tea.    Evening Meal   Roast Beef.  Baked potato.   Butter or milk-free margarine.   Broccoli.   Lettuce salad with vinegar and oil dressing.  [employer]   Coffee or tea.       Polyps, Colon  A polyp is extra tissue that grows inside your body. Colon polyps grow in the large intestine. The large intestine, also called the colon, is part of your digestive system. It is a long, hollow tube at the end of your digestive tract where your body makes and stores stool. Most polyps are not dangerous. They are benign. This means they are not cancerous. But over time, some types of polyps can turn into cancer. Polyps that are smaller than a pea are usually not harmful. But larger polyps could someday become or may already be cancerous. To be safe, doctors remove all polyps and test them.   PREVENTION There is not one sure way to prevent polyps. You might be able to lower your risk of getting them if you:  Eat more fruits and vegetables and less fatty food.   Do not smoke.   Avoid alcohol.   Exercise every day.   Lose weight if you are overweight.   Eating more calcium and folate can also lower your risk of getting polyps. Some foods that are rich in calcium are milk, cheese, and broccoli. Some foods that are rich in folate are chickpeas, kidney beans, and spinach.    Diverticulosis Diverticulosis is a  common condition that develops when small pouches (diverticula) form in the wall of the colon. The risk of diverticulosis increases with age. It happens more often in people who eat a low-fiber diet. Most individuals with diverticulosis have no symptoms. Those individuals with symptoms usually experience belly (abdominal) pain, constipation, or loose stools (diarrhea).  HOME CARE INSTRUCTIONS  Increase the amount of fiber in your diet as directed by your caregiver or dietician. This may reduce symptoms of diverticulosis.   Drink at least 6 to 8 glasses of water each day to  prevent constipation.   Try not to strain when you have a bowel movement.   Avoiding nuts and seeds to prevent complications is NOT NECESSARY.     FOODS HAVING HIGH FIBER CONTENT INCLUDE:  Fruits. Apple, peach, pear, tangerine, raisins, prunes.   Vegetables. Brussels sprouts, asparagus, broccoli, cabbage, carrot, cauliflower, romaine lettuce, spinach, summer squash, tomato, winter squash, zucchini.   Starchy Vegetables. Baked beans, kidney beans, lima beans, split peas, lentils, potatoes (with skin).   Grains. Whole wheat bread, brown rice, bran flake cereal, plain oatmeal, white rice, shredded wheat, bran muffins.    SEEK IMMEDIATE MEDICAL CARE IF:  You develop increasing pain or severe bloating.   You have an oral temperature above 101F.   You develop vomiting or bowel movements that are bloody or black.   Hemorrhoids Hemorrhoids are dilated (enlarged) veins around the rectum. Sometimes clots will form in the veins. This makes them swollen and painful. These are called thrombosed hemorrhoids. Causes of hemorrhoids include:  Constipation.   Straining to have a bowel movement.   HEAVY LIFTING  HOME CARE INSTRUCTIONS  Eat a well balanced diet and drink 6 to 8 glasses of water every day to avoid constipation. You may also use a bulk laxative.   Avoid straining to have bowel movements.   Keep anal area  dry and clean.   Do not use a donut shaped pillow or sit on the toilet for long periods. This increases blood pooling and pain.   Move your bowels when your body has the urge; this will require less straining and will decrease pain and pressure.

## 2015-09-19 NOTE — Anesthesia Postprocedure Evaluation (Signed)
Anesthesia Post Note  Patient: Ruth Robertson  Procedure(s) Performed: Procedure(s) (LRB): COLONOSCOPY WITH PROPOFOL (N/A) BIOPSY POLYPECTOMY  Patient location during evaluation: PACU Anesthesia Type: MAC Level of consciousness: awake, patient cooperative and oriented Pain management: pain level controlled Vital Signs Assessment: post-procedure vital signs reviewed and stable Respiratory status: spontaneous breathing, nonlabored ventilation and respiratory function stable Cardiovascular status: blood pressure returned to baseline and stable Postop Assessment: no signs of nausea or vomiting Anesthetic complications: no    Last Vitals:  Vitals:   09/19/15 1150 09/19/15 1155  BP: 108/77   Pulse:    Resp: (!) 23 (!) 21  Temp:      Last Pain:  Vitals:   09/19/15 1036  TempSrc: Oral  PainSc: 2                  Ruth Robertson

## 2015-09-19 NOTE — H&P (Signed)
Primary Care Physician:  Sallee Lange, MD Primary Gastroenterologist:  Dr. Oneida Alar  Pre-Procedure History & Physical: HPI:  Ruth Robertson is a 50 y.o. female here for Abnormal CT scan-SMALL BOWEL ENTERITIS TO THE ILEUM.  Past Medical History:  Diagnosis Date  . Anxiety   . Asthma    ONLY WITH BRONCHITIS  . GAD (generalized anxiety disorder)   . Pneumonia   . PONV (postoperative nausea and vomiting)     Past Surgical History:  Procedure Laterality Date  . HYSTEROSCOPY  2006  . SALPINGOOPHORECTOMY Bilateral 08/18/2012   Procedure: SALPINGO OOPHORECTOMY;  Surgeon: Anastasio Auerbach, MD;  Location: [facility];  Service: Gynecology;  Laterality: Bilateral;  . VAGINAL HYSTERECTOMY N/A 08/18/2012   Procedure: HYSTERECTOMY VAGINAL;  Surgeon: Anastasio Auerbach, MD;  Location: [facility];  Service: Gynecology;  Laterality: N/A;  . VAGINAL HYSTERECTOMY     Vag.Hyst BSO    Prior to Admission medications   Medication Sig Start Date End Date Taking? Authorizing Provider  acetaminophen (TYLENOL) 325 MG tablet Take 650 mg by mouth every 6 (six) hours as needed.   Yes Historical Provider, MD  ALPRAZolam Duanne Moron) 0.5 MG tablet Take 1 tablet (0.5 mg total) by mouth 2 (two) times daily as needed for anxiety. 05/02/15  Yes Kathyrn Drown, MD  buPROPion (WELLBUTRIN XL) 150 MG 24 hr tablet TAKE 1 TABLET BY MOUTH DAILY. 07/18/15  Yes Mikey Kirschner, MD  ciprofloxacin (CIPRO) 500 MG tablet Take 1 tablet (500 mg total) by mouth 2 (two) times daily. 09/10/15  Yes Samuella Cota, MD  potassium chloride (K-DUR) 10 MEQ tablet Take 1 tablet (10 mEq total) by mouth 2 (two) times daily. X 10 days 09/15/15 09/25/15 Yes Kathyrn Drown, MD  Sod Picosulfate-Mag Ox-Cit Acd 10-3.5-12 MG-GM-GM PACK Take 1 Container by mouth as directed. 09/13/15  Yes Danie Binder, MD  ondansetron (ZOFRAN) 4 MG tablet Take 1 tablet (4 mg total) by mouth every 6 (six) hours as needed for nausea. Patient not taking: Reported on 09/14/2015  09/10/15   Samuella Cota, MD    Allergies as of 09/13/2015  . (No Known Allergies)    Family History  Problem Relation Age of Onset  . Heart failure Father   . Stroke Father   . Breast cancer Maternal Aunt     50's  . Cancer Maternal Aunt 68    colon cancer  . Diabetes Paternal Grandfather   . Hypertension Maternal Grandmother   . Colon cancer Maternal Aunt   . Heart attack Mother     Social History   Social History  . Marital status: Divorced    Spouse name: N/A  . Number of children: N/A  . Years of education: N/A   Occupational History  . Not on file.   Social History Main Topics  . Smoking status: Current Every Day Smoker    Packs/day: 0.50    Years: 25.00    Types: Cigarettes  . Smokeless tobacco: Never Used  . Alcohol use 0.0 oz/week     Comment: occassionaly  . Drug use: No  . Sexual activity: Yes    Partners: Male     Comment: HYST-1st intercourse 40 yo-5 partners   Other Topics Concern  . Not on file   Social History Narrative  . No narrative on file    Review of Systems: See HPI, otherwise negative ROS   Physical Exam: BP 108/77   Pulse 74   Temp 97 F (36.1  C) (Oral)   Resp (!) 21   LMP 06/12/2012   SpO2 99%  General:   Alert,  pleasant and cooperative in NAD Head:  Normocephalic and atraumatic. Neck:  Supple; Lungs:  Clear throughout to auscultation.    Heart:  Regular rate and rhythm. Abdomen:  Soft, nontender and nondistended. Normal bowel sounds, without guarding, and without rebound.   Neurologic:  Alert and  oriented x4;  grossly normal neurologically.  Impression/Plan:    Abnormal CT scan-SMALL BOWEL ENTERITIS TO THE ILEUM  Plan:  TCS TODAY

## 2015-09-19 NOTE — Anesthesia Preprocedure Evaluation (Signed)
Anesthesia Evaluation  Patient identified by MRN, date of birth, ID band Patient awake    Reviewed: Allergy & Precautions, H&P , Patient's Chart, lab work & pertinent test results, reviewed documented beta blocker date and time   History of Anesthesia Complications (+) PONV and history of anesthetic complications  Airway Mallampati: II  TM Distance: >3 FB Neck ROM: full    Dental no notable dental hx.    Pulmonary neg pulmonary ROS, asthma , Current Smoker,    Pulmonary exam normal breath sounds clear to auscultation       Cardiovascular Exercise Tolerance: Good negative cardio ROS   Rhythm:regular Rate:Normal     Neuro/Psych PSYCHIATRIC DISORDERS Anxiety negative neurological ROS  negative psych ROS   GI/Hepatic Neg liver ROS, Nausea today    Endo/Other  negative endocrine ROS  Renal/GU negative Renal ROS     Musculoskeletal   Abdominal   Peds  Hematology negative hematology ROS (+)   Anesthesia Other Findings PONV (postoperative nausea and vomiting)     Fibroid        GAD (generalized anxiety disorder)     Asthma   ONLY WITH BRONCHITIS    Reproductive/Obstetrics negative OB ROS                             Anesthesia Physical Anesthesia Plan  ASA: II  Anesthesia Plan: MAC   Post-op Pain Management:    Induction: Intravenous  Airway Management Planned: Simple Face Mask  Additional Equipment:   Intra-op Plan:   Post-operative Plan:   Informed Consent: I have reviewed the patients History and Physical, chart, labs and discussed the procedure including the risks, benefits and alternatives for the proposed anesthesia with the patient or authorized representative who has indicated his/her understanding and acceptance.     Plan Discussed with:   Anesthesia Plan Comments:         Anesthesia Quick Evaluation

## 2015-09-19 NOTE — Transfer of Care (Signed)
Immediate Anesthesia Transfer of Care Note  Patient: Ruth Robertson  Procedure(s) Performed: Procedure(s) with comments: COLONOSCOPY WITH PROPOFOL (N/A) - 1230 BIOPSY - random colon bx's POLYPECTOMY - descending colon polyps x3 cold bx, rectal polyp x2  Patient Location: PACU  Anesthesia Type:MAC  Level of Consciousness: sedated  Airway & Oxygen Therapy: Patient Spontanous Breathing and Patient connected to face mask oxygen  Post-op Assessment: Report given to RN, Post -op Vital signs reviewed and stable and Patient moving all extremities  Post vital signs: Reviewed and stable  Last Vitals:  Vitals:   09/19/15 1150 09/19/15 1155  BP: 108/77   Pulse:    Resp: (!) 23 (!) 21  Temp:      Last Pain:  Vitals:   09/19/15 1036  TempSrc: Oral  PainSc: 2       Patients Stated Pain Goal: 3 ([id number])  Complications: No apparent anesthesia complications

## 2015-09-20 ENCOUNTER — Telehealth: Payer: Self-pay | Admitting: Gastroenterology

## 2015-09-20 ENCOUNTER — Telehealth: Payer: Self-pay | Admitting: Family Medicine

## 2015-09-20 MED FILL — VIBERZI 75 MG TABLET: 75 | 30 days supply | Qty: 60 | Fill #0

## 2015-09-20 NOTE — Telephone Encounter (Signed)
I spoke to pt and she said she was referred to [place] from Dr. Wolfgang Phoenix. She has been out of work since 09/06/2015. She thought that Dr. Oneida Alar would need to release her back to him so he can complete her FMLA papers and tell her when to go back to work.  Forwarding to Dr. Oneida Alar for advice.

## 2015-09-20 NOTE — Telephone Encounter (Signed)
Patient called this morning to say that she had colonoscopy yesterday by [facility] and is needing a release date to go to her PCP so he can complete her medical release papers. Please call her at [phone]

## 2015-09-20 NOTE — Telephone Encounter (Signed)
REVIEWED-NO ADDITIONAL RECOMMENDATIONS. 

## 2015-09-20 NOTE — Telephone Encounter (Signed)
PT is aware.

## 2015-09-20 NOTE — Telephone Encounter (Addendum)
PLEASE CALL PT. CALLED DR. Lance Sell [facility]. DR. Nicki Reaper NOT IN TODAY. SPOKE TO DR. Richardson Landry. PT'S GI WORKUP IS COMPLETE. AWAITING BIOPSY BUT MAY RETURN TO DR. Wolfgang Phoenix FOR CARE. EXPECT PT MAY BE ABLE TO RETURN TO WORK AUG 23.

## 2015-09-20 NOTE — Telephone Encounter (Signed)
Please discuss with the patient that we did receive a phone call from gastroenterology stating that they felt she could return to work on 09/27/2015. The patient has 2 options option #1 his if she feels that this is fine then we will complete her papers for her to return to work on the 23rd. Option #2 the patient can have a follow-up office visit Monday or Tuesday to see me please see what the patient would like to do.

## 2015-09-21 ENCOUNTER — Telehealth: Payer: Self-pay | Admitting: Family Medicine

## 2015-09-21 NOTE — Telephone Encounter (Signed)
Notified patient that we did receive a phone call from gastroenterology stating that they felt she could return to work on 09/27/2015. The patient has 2 options option #1 his if she feels that this is fine then we will complete her papers for her to return to work on the 23rd. Option #2 the patient can have a follow-up office visit Monday or Tuesday to see Dr. Nicki Reaper. Patient transferred to front desk to schedule appointment.

## 2015-09-21 NOTE — Telephone Encounter (Signed)
Pt is very upset that she can not get an expected return to work date on the paper work in order for her to receive payment on her short disability. She does not have to return on the date of the 23rd but is expecting to. Once she sees you on the 22nd she will know more of how she feels and at that time it may be extended if need be or she will return on the 23rd.   Please advise   Pt is wanting to put the 28th but that is because she feels she may have to redo the paper work if we say the 23rd.  She is all over the place with her thoughts honestly she is just wanting this done now not Tuesday

## 2015-09-21 NOTE — Telephone Encounter (Signed)
Talked with patient and she wants the 28th to be her return to work date. Per Dr. Nicki Reaper- go ahead and put August 28 as the return to work date on her paperwork. Patient to keep follow up on Tuesday with him. Patient wants the paperwork faxed in once completed.

## 2015-09-22 ENCOUNTER — Encounter (HOSPITAL_COMMUNITY): Payer: Self-pay | Admitting: Gastroenterology

## 2015-09-25 ENCOUNTER — Telehealth: Payer: Self-pay | Admitting: Gastroenterology

## 2015-09-25 NOTE — Telephone Encounter (Signed)
PLEASE CALL PT. SHE HAS normal colon biopsies. SHE HAD TWO HYPERPLASTIC POLYPS REMOVED.    STOP SMOKING. IT WILL INCREASE YOUR RISK OF COLON CANCER.  DRINK WATER TO KEEP YOUR URINE LIGHT YELLOW.  FOLLOW A HIGH FIBER/DAIRY FREE DIET. AVOID ITEMS THAT CAUSE BLOATING.   TAKE VIBERZI ONE TABLET DAILY OR EVERY OTHER DAY FOR 7 DAYS TO CONTROL DIARRHEA. IF YOUR DIARRHEA IMPROVES BUT DOES NOT RESOLVE AFTER 7 DAYS, THEN TAKE VIBERZI TWICE DAILY. HOLD IF YOU DON'T HAVE A BM IN A 24 HR PERIOD OF TIME. USE ONLY IMODIUM 1 OR 2 TIMES A DAY IF NEEDED TO CONTROL LOOSE STOOLS. YOU MUST CALL IF YOU GO MORE THAN 4 DAYS WITHOUT A BOWEL MOVEMENT OR YOU DEVELOP INCREASE IN NAUSEA, VOMITING, OR ABDOMINAL PAIN.  USE PREPARATION H CREAM FOUR TIMES  A DAY IF NEEDED TO RELIEVE RECTAL PAIN/PRESSURE/BLEEDING.   Next colonoscopy in 10 years.

## 2015-09-26 ENCOUNTER — Ambulatory Visit (INDEPENDENT_AMBULATORY_CARE_PROVIDER_SITE_OTHER): Payer: 59 | Admitting: Family Medicine

## 2015-09-26 ENCOUNTER — Encounter: Payer: Self-pay | Admitting: Family Medicine

## 2015-09-26 ENCOUNTER — Ambulatory Visit
Admission: RE | Admit: 2015-09-26 | Discharge: 2015-09-26 | Disposition: A | Discharge status: Home / Self Care | Payer: 59 | Source: Ambulatory Visit | Attending: Gynecology | Admitting: Gynecology

## 2015-09-26 VITALS — BP 102/68 | Ht 66.0 in | Wt 142.4 lb

## 2015-09-26 DIAGNOSIS — B370 Candidal stomatitis: Secondary | ICD-10-CM

## 2015-09-26 DIAGNOSIS — Z1231 Encounter for screening mammogram for malignant neoplasm of breast: Secondary | ICD-10-CM | POA: Diagnosis not present

## 2015-09-26 DIAGNOSIS — K529 Noninfective gastroenteritis and colitis, unspecified: Secondary | ICD-10-CM

## 2015-09-26 DIAGNOSIS — R140 Abdominal distension (gaseous): Secondary | ICD-10-CM | POA: Diagnosis not present

## 2015-09-26 MED ORDER — NYSTATIN 100000 UNIT/ML MT SUSP: OROMUCOSAL | 0 refills | Status: DC

## 2015-09-26 MED FILL — NYSTATIN 100,000 UNITS/ML S: 100000 | 8 days supply | Qty: 160 | Fill #0

## 2015-09-26 NOTE — Telephone Encounter (Signed)
PT is aware.

## 2015-09-26 NOTE — Progress Notes (Signed)
   Subjective:    Patient ID: Ruth Robertson, female    DOB: 11-Nov-1965, 50 y.o.   MRN: [id number]  HPI Patient is here today to follow up on her FMLA status. Patient states that her tongue gets white a lot. She would like to discuss this with the doctor today.  Patient has no other concerns at this time.  Was having abdominal swelling nausea vomiting diarrhea that has got better except for the diarrhea intermittent Review of Systems  Constitutional: Positive for fatigue. Negative for fever.  HENT: Negative for congestion.   Respiratory: Negative for cough.   Cardiovascular: Negative for chest pain.  Gastrointestinal: Negative for abdominal pain and anal bleeding.   Patient complains of thrush-like symptoms intermittently    Objective:   Physical Exam  Constitutional: She appears well-nourished. No distress.  Cardiovascular: Normal rate, regular rhythm and normal heart sounds.   No murmur heard. Pulmonary/Chest: Effort normal and breath sounds normal. No respiratory distress.  Abdominal: Soft. She exhibits no distension.  Musculoskeletal: She exhibits no edema.  Lymphadenopathy:    She has no cervical adenopathy.  Neurological: She is alert. She exhibits normal muscle tone.  Psychiatric: Her behavior is normal.  Vitals reviewed.     Patient had viral enteritis with colitis now doing much better still occasional loose stools no fever chills    Assessment & Plan:  Patient doing much better. Abdominal swelling is now down. May return to work tomorrow without restrictions.  If has relapse or bloody stools or other type of setbacks then immediately follow-up

## 2015-09-26 NOTE — Telephone Encounter (Signed)
Reminder in epic °

## 2015-09-27 ENCOUNTER — Other Ambulatory Visit: Payer: Self-pay | Admitting: Gynecology

## 2015-09-27 DIAGNOSIS — R928 Other abnormal and inconclusive findings on diagnostic imaging of breast: Secondary | ICD-10-CM

## 2015-09-28 ENCOUNTER — Ambulatory Visit
Admission: RE | Admit: 2015-09-28 | Discharge: 2015-09-28 | Disposition: A | Discharge status: Home / Self Care | Payer: 59 | Source: Ambulatory Visit | Attending: Gynecology | Admitting: Gynecology

## 2015-09-28 DIAGNOSIS — N63 Unspecified lump in breast: Secondary | ICD-10-CM | POA: Diagnosis not present

## 2015-09-28 DIAGNOSIS — R928 Other abnormal and inconclusive findings on diagnostic imaging of breast: Secondary | ICD-10-CM

## 2015-10-10 ENCOUNTER — Telehealth: Payer: Self-pay | Admitting: Gastroenterology

## 2015-10-10 NOTE — Telephone Encounter (Signed)
Pt called this morning to say that she had seen SF in the hospital and she did her colonoscopy not long ago. Patient is still having problems with diarrhea and didn't know if she needed to make OV or was there something else she needed to do. I transferred call to DS VM

## 2015-10-10 NOTE — Telephone Encounter (Signed)
I spoke to pt and she is still having a lot of nausea/ vomiting and diarrhea. Appt made with Roseanne Kaufman, NP for 10/11/2015 at 9:00 Am.

## 2015-10-10 NOTE — Telephone Encounter (Signed)
LMOM to call.

## 2015-10-11 ENCOUNTER — Encounter: Payer: Self-pay | Admitting: Gastroenterology

## 2015-10-11 ENCOUNTER — Ambulatory Visit (INDEPENDENT_AMBULATORY_CARE_PROVIDER_SITE_OTHER): Payer: 59 | Admitting: Gastroenterology

## 2015-10-11 VITALS — BP 105/79 | HR 60 | Temp 98.1°F | Ht 66.0 in | Wt 140.8 lb

## 2015-10-11 DIAGNOSIS — R197 Diarrhea, unspecified: Secondary | ICD-10-CM

## 2015-10-11 MED ORDER — HYOSCYAMINE SULFATE 0.125 MG SL SUBL: 0.1250 mg | SUBLINGUAL_TABLET | SUBLINGUAL | 3 refills | Status: DC | PRN

## 2015-10-11 MED FILL — OSCIMIN SL 0.125 MG TABLET: 0.125 | 15 days supply | Qty: 60 | Fill #0

## 2015-10-11 NOTE — Patient Instructions (Signed)
Please complete labs today.   I have sent in Levsin to take every 4 hours as needed for abdominal cramping and diarrhea. If this is not helpful, call me or send a message in MyChart and we will go over Viberzi dosing.   Start taking a probiotic such as Align once daily. Other options are Restora, [employer], [employer] brand.   Start taking Prilosec once each morning, 30 minutes before breakfast just for 30 days.   I will see you in 6-8 weeks.

## 2015-10-11 NOTE — Assessment & Plan Note (Signed)
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 Overall, seems to be clinically improving albeit slowly. Discussed dietary modification. Took Viberzi for only 1 week. Will trial Levsin for now. May need to take Viberzi but it appears that she is only having 2 episodes of diarrhea a week; therefore, she has improved significantly and Viberzi would likely significantly constipate her. Will use Levsin prn, add a probiotic, follow a modified diet, add Prilosec once daily for 30 days, and check celiac serologies (per her request). Return in 6-8 weeks.

## 2015-10-11 NOTE — Progress Notes (Signed)
cc'ed to pcp °

## 2015-10-11 NOTE — Progress Notes (Addendum)
REVIEWED-NO ADDITIONAL RECOMMENDATIONS.  Referring Provider: Kathyrn Drown, MD Primary Care Physician:  Sallee Lange, MD  Primary GI: Dr. Oneida Alar   Chief Complaint  Patient presents with  . Abdominal Pain  . Diarrhea  . Nausea    HPI:   Ruth Robertson is a 50 y.o. female presenting today with a history of N/V/D in August, with CT concerning for Crohn's disease vs gastroenteritis. 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 Here for follow-up. Diarrhea like water once or twice a week.   [place] on Thursday and was fine. Ate in [place] over the weekend and was fine. Sunday afternoon ate taste of [place] and ate sushi, rice, and soup and was sick Sunday, Monday. Had diarrhea. Had pain in upper abdomen radiating down into lower abdomen. Only a few instances of nausea and vomiting since out of the hospital. Has a lot of belching. Took Viberzi once a day for 7 days. No more. Hard to remember if any improvement with this. Hasn't been right since got sick. The biggest concern is diarrhea and abdominal pain. N/V is minimal.  Stools have been softer since being sick. Was fine in [place]. No typical reflux. No dysphagia.    Prior to getting sick, would have a BM once a day.   Past Medical History:  Diagnosis Date  . Anxiety   . Asthma    ONLY WITH BRONCHITIS  . GAD (generalized anxiety disorder)   . Pneumonia   . PONV (postoperative nausea and vomiting)     Past Surgical History:  Procedure Laterality Date  . BIOPSY  09/19/2015   Procedure: BIOPSY;  Surgeon: Danie Binder, MD;  Location: [facility];  Service: Endoscopy;;  random colon bx's  . COLONOSCOPY WITH PROPOFOL N/A 09/19/2015   Dr. Oneida Alar: five 2-3 mm polyps in rectum (hyperplastic) and descending colon, diverticulosis in sigmoid colon and transverse colon. Query post-infectious IBS. Next colonoscopy in 10 years   . HYSTEROSCOPY  2006  . POLYPECTOMY  09/19/2015   Procedure:  POLYPECTOMY;  Surgeon: Danie Binder, MD;  Location: [facility];  Service: Endoscopy;;  descending colon polyps x3 cold bx, rectal polyp x2  . SALPINGOOPHORECTOMY Bilateral 08/18/2012   Procedure: SALPINGO OOPHORECTOMY;  Surgeon: Anastasio Auerbach, MD;  Location: [facility];  Service: Gynecology;  Laterality: Bilateral;  . VAGINAL HYSTERECTOMY N/A 08/18/2012   Procedure: HYSTERECTOMY VAGINAL;  Surgeon: Anastasio Auerbach, MD;  Location: [facility];  Service: Gynecology;  Laterality: N/A;  . VAGINAL HYSTERECTOMY     Vag.Hyst BSO    Current Outpatient Prescriptions  Medication Sig Dispense Refill  . acetaminophen (TYLENOL) 325 MG tablet Take 650 mg by mouth every 6 (six) hours as needed.    . ALPRAZolam (XANAX) 0.5 MG tablet Take 1 tablet (0.5 mg total) by mouth 2 (two) times daily as needed for anxiety. 30 tablet 3  . buPROPion (WELLBUTRIN XL) 150 MG 24 hr tablet TAKE 1 TABLET BY MOUTH DAILY. 30 tablet 5  . nystatin (MYCOSTATIN) 100000 UNIT/ML suspension Take 1 teaspoon po swish and swallow QID x 7 days 160 mL 0  . hyoscyamine (LEVSIN SL) 0.125 MG SL tablet Place 1 tablet (0.125 mg total) under the tongue every 4 (four) hours as needed. 60 tablet 3  . potassium chloride (K-DUR) 10 MEQ tablet Take 1 tablet (10 mEq total) by mouth 2 (two) times daily. X 10 days 20 tablet 1   No current facility-administered medications for this visit.  Allergies as of 10/11/2015  . (No Known Allergies)    Family History  Problem Relation Age of Onset  . Heart failure Father   . Stroke Father   . Breast cancer Maternal Aunt     50's  . Cancer Maternal Aunt 45    colon cancer  . Diabetes Paternal Grandfather   . Hypertension Maternal Grandmother   . Colon cancer Maternal Aunt   . Heart attack Mother     Social History   Social History  . Marital status: Divorced    Spouse name: N/A  . Number of children: N/A  . Years of education: N/A   Social History Main Topics  . Smoking status: Current  Every Day Smoker    Packs/day: 0.50    Years: 25.00    Types: Cigarettes  . Smokeless tobacco: Never Used  . Alcohol use 0.0 oz/week     Comment: occassionaly  . Drug use: No  . Sexual activity: Yes    Partners: Male     Comment: HYST-1st intercourse 43 yo-5 partners   Other Topics Concern  . Not on file   Social History Narrative  . No narrative on file    Review of Systems: Negative unless mentioned in HPI   Physical Exam: BP 105/79   Pulse 60   Temp 98.1 F (36.7 C) (Oral)   Ht 5\' 6"  (1.676 m)   Wt 140 lb 12.8 oz (63.9 kg)   LMP 06/12/2012   BMI 22.73 kg/m  General:   Alert and oriented. No distress noted. Pleasant and cooperative.  Head:  Normocephalic and atraumatic. Eyes:  Conjuctiva clear without scleral icterus. Abdomen:  +BS, soft, mild TTP lower abdomen and non-distended. No rebound or guarding. No HSM or masses noted. Msk:  Symmetrical without gross deformities. Normal posture. Extremities:  Without edema. Neurologic:  Alert and  oriented x4;  grossly normal neurologically. Psych:  Alert and cooperative. Normal mood and affect.

## 2015-10-13 LAB — TISSUE TRANSGLUTAMINASE, IGA: Tissue Transglutaminase Ab, IgA: 1 U/mL (ref <4)

## 2015-10-13 LAB — IGA: IgA: 154 mg/dL (ref 81–463)

## 2015-10-18 NOTE — Progress Notes (Signed)
Pt is aware.  

## 2015-10-18 NOTE — Progress Notes (Signed)
Celiac serologies are normal. Hope you are doing well!

## 2015-11-24 MED FILL — BUPROPION HCL XL 150 MG TAB: 150 | 30 days supply | Qty: 30 | Fill #2

## 2015-11-28 ENCOUNTER — Encounter: Payer: Self-pay | Admitting: Gynecology

## 2015-11-28 ENCOUNTER — Ambulatory Visit (INDEPENDENT_AMBULATORY_CARE_PROVIDER_SITE_OTHER): Payer: 59 | Admitting: Gynecology

## 2015-11-28 VITALS — BP 120/76 | Ht 65.5 in | Wt 145.0 lb

## 2015-11-28 DIAGNOSIS — Z79890 Hormone replacement therapy (postmenopausal): Secondary | ICD-10-CM

## 2015-11-28 DIAGNOSIS — Z01419 Encounter for gynecological examination (general) (routine) without abnormal findings: Secondary | ICD-10-CM | POA: Diagnosis not present

## 2015-11-28 NOTE — Patient Instructions (Signed)
Call if you decide you want to start estrogen replacement.  You may obtain a copy of any labs that were done today by logging onto MyChart as outlined in the instructions provided with your AVS (after visit summary). The office will not call with normal lab results but certainly if there are any significant abnormalities then we will contact you.   Health Maintenance Adopting a healthy lifestyle and getting preventive care can go a long way to promote health and wellness. Talk with your health care provider about what schedule of regular examinations is right for you. This is a good chance for you to check in with your provider about disease prevention and staying healthy. In between checkups, there are plenty of things you can do on your own. Experts have done a lot of research about which lifestyle changes and preventive measures are most likely to keep you healthy. Ask your health care provider for more information. WEIGHT AND DIET  Eat a healthy diet  Be sure to include plenty of vegetables, fruits, low-fat dairy products, and lean protein.  Do not eat a lot of foods high in solid fats, added sugars, or salt.  Get regular exercise. This is one of the most important things you can do for your health.  Most adults should exercise for at least 150 minutes each week. The exercise should increase your heart rate and make you sweat (moderate-intensity exercise).  Most adults should also do strengthening exercises at least twice a week. This is in addition to the moderate-intensity exercise.  Maintain a healthy weight  Body mass index (BMI) is a measurement that can be used to identify possible weight problems. It estimates body fat based on height and weight. Your health care provider can help determine your BMI and help you achieve or maintain a healthy weight.  For females 36 years of age and older:   A BMI below 18.5 is considered underweight.  A BMI of 18.5 to 24.9 is normal.  A BMI  of 25 to 29.9 is considered overweight.  A BMI of 30 and above is considered obese.  Watch levels of cholesterol and blood lipids  You should start having your blood tested for lipids and cholesterol at 50 years of age, then have this test every 5 years.  You may need to have your cholesterol levels checked more often if:  Your lipid or cholesterol levels are high.  You are older than 50 years of age.  You are at high risk for heart disease.  CANCER SCREENING   Lung Cancer  Lung cancer screening is recommended for adults 40-63 years old who are at high risk for lung cancer because of a history of smoking.  A yearly low-dose CT scan of the lungs is recommended for people who:  Currently smoke.  Have quit within the past 15 years.  Have at least a 30-pack-year history of smoking. A pack year is smoking an average of one pack of cigarettes a day for 1 year.  Yearly screening should continue until it has been 15 years since you quit.  Yearly screening should stop if you develop a health problem that would prevent you from having lung cancer treatment.  Breast Cancer  Practice breast self-awareness. This means understanding how your breasts normally appear and feel.  It also means doing regular breast self-exams. Let your health care provider know about any changes, no matter how small.  If you are in your 20s or 30s, you should have a  clinical breast exam (CBE) by a health care provider every 1-3 years as part of a regular health exam.  If you are 10 or older, have a CBE every year. Also consider having a breast X-ray (mammogram) every year.  If you have a family history of breast cancer, talk to your health care provider about genetic screening.  If you are at high risk for breast cancer, talk to your health care provider about having an MRI and a mammogram every year.  Breast cancer gene (BRCA) assessment is recommended for women who have family members with  BRCA-related cancers. BRCA-related cancers include:  Breast.  Ovarian.  Tubal.  Peritoneal cancers.  Results of the assessment will determine the need for genetic counseling and BRCA1 and BRCA2 testing. Cervical Cancer Routine pelvic examinations to screen for cervical cancer are no longer recommended for nonpregnant women who are considered low risk for cancer of the pelvic organs (ovaries, uterus, and vagina) and who do not have symptoms. A pelvic examination may be necessary if you have symptoms including those associated with pelvic infections. Ask your health care provider if a screening pelvic exam is right for you.   The Pap test is the screening test for cervical cancer for women who are considered at risk.  If you had a hysterectomy for a problem that was not cancer or a condition that could lead to cancer, then you no longer need Pap tests.  If you are older than 65 years, and you have had normal Pap tests for the past 10 years, you no longer need to have Pap tests.  If you have had past treatment for cervical cancer or a condition that could lead to cancer, you need Pap tests and screening for cancer for at least 20 years after your treatment.  If you no longer get a Pap test, assess your risk factors if they change (such as having a new sexual partner). This can affect whether you should start being screened again.  Some women have medical problems that increase their chance of getting cervical cancer. If this is the case for you, your health care provider may recommend more frequent screening and Pap tests.  The human papillomavirus (HPV) test is another test that may be used for cervical cancer screening. The HPV test looks for the virus that can cause cell changes in the cervix. The cells collected during the Pap test can be tested for HPV.  The HPV test can be used to screen women 11 years of age and older. Getting tested for HPV can extend the interval between normal Pap  tests from three to five years.  An HPV test also should be used to screen women of any age who have unclear Pap test results.  After 50 years of age, women should have HPV testing as often as Pap tests.  Colorectal Cancer  This type of cancer can be detected and often prevented.  Routine colorectal cancer screening usually begins at 50 years of age and continues through 50 years of age.  Your health care provider may recommend screening at an earlier age if you have risk factors for colon cancer.  Your health care provider may also recommend using home test kits to check for hidden blood in the stool.  A small camera at the end of a tube can be used to examine your colon directly (sigmoidoscopy or colonoscopy). This is done to check for the earliest forms of colorectal cancer.  Routine screening usually begins at  age 56.  Direct examination of the colon should be repeated every 5-10 years through 50 years of age. However, you may need to be screened more often if early forms of precancerous polyps or small growths are found. Skin Cancer  Check your skin from head to toe regularly.  Tell your health care provider about any new moles or changes in moles, especially if there is a change in a mole's shape or color.  Also tell your health care provider if you have a mole that is larger than the size of a pencil eraser.  Always use sunscreen. Apply sunscreen liberally and repeatedly throughout the day.  Protect yourself by wearing long sleeves, pants, a wide-brimmed hat, and sunglasses whenever you are outside. HEART DISEASE, DIABETES, AND HIGH BLOOD PRESSURE   Have your blood pressure checked at least every 1-2 years. High blood pressure causes heart disease and increases the risk of stroke.  If you are between 58 years and 16 years old, ask your health care provider if you should take aspirin to prevent strokes.  Have regular diabetes screenings. This involves taking a blood sample  to check your fasting blood sugar level.  If you are at a normal weight and have a low risk for diabetes, have this test once every three years after 50 years of age.  If you are overweight and have a high risk for diabetes, consider being tested at a younger age or more often. PREVENTING INFECTION  Hepatitis B  If you have a higher risk for hepatitis B, you should be screened for this virus. You are considered at high risk for hepatitis B if:  You were born in a country where hepatitis B is common. Ask your health care provider which countries are considered high risk.  Your parents were born in a high-risk country, and you have not been immunized against hepatitis B (hepatitis B vaccine).  You have HIV or AIDS.  You use needles to inject street drugs.  You live with someone who has hepatitis B.  You have had sex with someone who has hepatitis B.  You get hemodialysis treatment.  You take certain medicines for conditions, including cancer, organ transplantation, and autoimmune conditions. Hepatitis C  Blood testing is recommended for:  Everyone born from [birth year] through 1965.  Anyone with known risk factors for hepatitis C. Sexually transmitted infections (STIs)  You should be screened for sexually transmitted infections (STIs) including gonorrhea and chlamydia if:  You are sexually active and are younger than 50 years of age.  You are older than 50 years of age and your health care provider tells you that you are at risk for this type of infection.  Your sexual activity has changed since you were last screened and you are at an increased risk for chlamydia or gonorrhea. Ask your health care provider if you are at risk.  If you do not have HIV, but are at risk, it may be recommended that you take a prescription medicine daily to prevent HIV infection. This is called pre-exposure prophylaxis (PrEP). You are considered at risk if:  You are sexually active and do not regularly  use condoms or know the HIV status of your partner(s).  You take drugs by injection.  You are sexually active with a partner who has HIV. Talk with your health care provider about whether you are at high risk of being infected with HIV. If you choose to begin PrEP, you should first be tested for HIV. You  should then be tested every 3 months for as long as you are taking PrEP.  PREGNANCY   If you are premenopausal and you may become pregnant, ask your health care provider about preconception counseling.  If you may become pregnant, take 400 to 800 micrograms (mcg) of folic acid every day.  If you want to prevent pregnancy, talk to your health care provider about birth control (contraception). OSTEOPOROSIS AND MENOPAUSE   Osteoporosis is a disease in which the bones lose minerals and strength with aging. This can result in serious bone fractures. Your risk for osteoporosis can be identified using a bone density scan.  If you are 65 years of age or older, or if you are at risk for osteoporosis and fractures, ask your health care provider if you should be screened.  Ask your health care provider whether you should take a calcium or vitamin D supplement to lower your risk for osteoporosis.  Menopause may have certain physical symptoms and risks.  Hormone replacement therapy may reduce some of these symptoms and risks. Talk to your health care provider about whether hormone replacement therapy is right for you.  HOME CARE INSTRUCTIONS   Schedule regular health, dental, and eye exams.  Stay current with your immunizations.   Do not use any tobacco products including cigarettes, chewing tobacco, or electronic cigarettes.  If you are pregnant, do not drink alcohol.  If you are breastfeeding, limit how much and how often you drink alcohol.  Limit alcohol intake to no more than 1 drink per day for nonpregnant women. One drink equals 12 ounces of beer, 5 ounces of wine, or 1 ounces of hard  liquor.  Do not use street drugs.  Do not share needles.  Ask your health care provider for help if you need support or information about quitting drugs.  Tell your health care provider if you often feel depressed.  Tell your health care provider if you have ever been abused or do not feel safe at home. Document Released: 08/06/2010 Document Revised: 06/07/2013 Document Reviewed: 12/23/2012 ExitCare Patient Information 2015 [employer]. This information is not intended to replace advice given to you by your health care provider. Make sure you discuss any questions you have with your health care provider.  

## 2015-11-28 NOTE — Progress Notes (Signed)
    Ruth Robertson April 06, 1965 [id number]        50 y.o.  G1P1001  for annual exam.  Doing well.  Past medical history,surgical history, problem list, medications, allergies, family history and social history were all reviewed and documented as reviewed in the EPIC chart.  ROS:  Performed with pertinent positives and negatives included in the history, assessment and plan.   Additional significant findings :  None   Exam: [name] assistant Vitals:   11/28/15 1601  BP: 120/76  Weight: 145 lb (65.8 kg)  Height: 5' 5.5" (1.664 m)   Body mass index is 23.76 kg/m.  General appearance:  Normal affect, orientation and appearance. Skin: Grossly normal HEENT: Without gross lesions.  No cervical or supraclavicular adenopathy. Thyroid normal.  Lungs:  Clear without wheezing, rales or rhonchi Cardiac: RR, without RMG Abdominal:  Soft, nontender, without masses, guarding, rebound, organomegaly or hernia Breasts:  Examined lying and sitting without masses, retractions, discharge or axillary adenopathy. Pelvic:  Ext, BUS, Vagina ormal with mild atrophic changes  Adnexa without masses or tenderness    Anus and perineum normal   Rectovaginal normal sphincter tone without palpated masses or tenderness.    Assessment/Plan:  50 y.o. G48P1001 female for annual exam.   1. Status post TVH BSO 2014. Transiently started on ERT but ultimately stopped it. Having occasional hot flushes night. I reviewed the latest 2017 names guidelines on HRT. The issues as far as early initiation having benefit as far as cardiovascular/bone health particularly in a patient who underwent BSO in her 43s discussed. Risks as far as thrombosis and breast also reviewed. No firm recommendation to start prophylactically in an asymptomatic or low symptomatic patient at this point but certainly evidence to show benefit from a heart and bones standpoint reviewed. Discussed with patient in detail the various studies and  she wants to think about whether she would like to start ERT. Various routes to include oral transdermal and transvaginal discussed. Benefits as far as first pass effect from a thrombosis standpoint also reviewed. Patient does not like the idea of ring or patch. Would start on oral if she reinitiates. She will call me with her decision. 2. Mammography 09/2015. Continue with annual mammography when due. SBE monthly reviewed. 3. Colonoscopy 2017. Repeat at their recommended interval. 4. Pap smear 2015. No Pap smear done today. No history of significant abnormal Pap smears. Options to stop screening altogether or less frequent screening intervals reviewed. Will readdress on annual basis. 5. Health maintenance. Recently had CBC,CMP and urinalysis done. Lipid profiles 2 years in a row were normal. We'll hold on any blood work now. Follow up in one year. Call with decision as far as ERT.  10 minutes of my time in excess of her routine gynecologic exam was spent in direct face to face counseling and coordination of care in regards to her ERT counseling and review of the most recent  2017 NAMS guidelines    Anastasio Auerbach MD, 4:31 PM 11/28/2015

## 2015-11-29 ENCOUNTER — Ambulatory Visit: Payer: 59 | Admitting: Gastroenterology

## 2016-01-19 MED FILL — BUPROPION HCL XL 150 MG TAB: 150 | 30 days supply | Qty: 30 | Fill #3

## 2016-01-31 MED FILL — VALACYCLOVIR HCL 500 MG TAB: 500 | 30 days supply | Qty: 16 | Fill #0

## 2016-03-27 MED FILL — BUPROPION HCL XL 150 MG TAB: 150 | 30 days supply | Qty: 30 | Fill #4

## 2016-05-09 MED FILL — BUPROPION HCL XL 150 MG TAB: 150 | 30 days supply | Qty: 30 | Fill #5

## 2016-06-04 ENCOUNTER — Encounter: Payer: Self-pay | Admitting: Women's Health

## 2016-06-04 ENCOUNTER — Ambulatory Visit (INDEPENDENT_AMBULATORY_CARE_PROVIDER_SITE_OTHER): Payer: 59 | Admitting: Women's Health

## 2016-06-04 ENCOUNTER — Other Ambulatory Visit: Payer: Self-pay | Admitting: Women's Health

## 2016-06-04 VITALS — BP 128/80

## 2016-06-04 DIAGNOSIS — N3001 Acute cystitis with hematuria: Secondary | ICD-10-CM

## 2016-06-04 DIAGNOSIS — R300 Dysuria: Secondary | ICD-10-CM

## 2016-06-04 DIAGNOSIS — N3000 Acute cystitis without hematuria: Secondary | ICD-10-CM | POA: Diagnosis not present

## 2016-06-04 LAB — URINALYSIS W MICROSCOPIC + REFLEX CULTURE
Bilirubin Urine: NEGATIVE
Casts: NONE SEEN [LPF]
Crystals: NONE SEEN [HPF]
Glucose, UA: NEGATIVE
Ketones, ur: NEGATIVE
Nitrite: NEGATIVE
Specific Gravity, Urine: <1.005 (ref 1.001–1.035)
Yeast: NONE SEEN [HPF]
pH: 6 (ref 5.0–8.0)

## 2016-06-04 MED ORDER — PHENAZOPYRIDINE HCL 200 MG PO TABS: 200.0000 mg | ORAL_TABLET | Freq: Three times a day (TID) | ORAL | 0 refills | Status: DC | PRN

## 2016-06-04 MED ORDER — SULFAMETHOXAZOLE-TRIMETHOPRIM 800-160 MG PO TABS: 1.0000 | ORAL_TABLET | Freq: Two times a day (BID) | ORAL | 0 refills | Status: DC

## 2016-06-04 MED FILL — SULFAMETHOXAZOLE/TMP DS TAB: 800-160 | 3 days supply | Qty: 6 | Fill #0

## 2016-06-04 MED FILL — PHENAZOPYRIDINE 200 MG TAB: 200 | 5 days supply | Qty: 15 | Fill #0

## 2016-06-04 NOTE — Patient Instructions (Signed)

## 2016-06-04 NOTE — Progress Notes (Signed)
Presents today with complaints of burning, with urination, frequency, urgency and mild back pain for one day. Denies abdominal pain, nausea, fever or vaginal discharge. TVH, BSO in 2014. No HRT. History of normal mammogram.   Exam; Appears uncomfortable. No CVAT, discomfort lower lumbar area. UA; cloudy, blood 3+, Leu. 2+, WBC packed, RBC packed, sq, epithelial 6-10, bacteria many, mucus- moderate.   UTI with hematuria  Plan: Bactrim 800/160 bid for 3 days, pyridium 200mg  TID prn 15 tablets. Drink plenty of fluids, UA culture pending.  Instructed to call if no relief.

## 2016-06-06 ENCOUNTER — Encounter: Payer: Self-pay | Admitting: Women's Health

## 2016-06-06 LAB — URINE CULTURE

## 2016-06-07 ENCOUNTER — Telehealth: Payer: Self-pay | Admitting: *Deleted

## 2016-06-07 MED ORDER — CIPROFLOXACIN HCL 250 MG PO TABS: 250.0000 mg | ORAL_TABLET | Freq: Two times a day (BID) | ORAL | 0 refills | Status: DC

## 2016-06-07 MED FILL — CIPROFLOXACIN HCL 250 MG TA: 250 | 5 days supply | Qty: 10 | Fill #0

## 2016-06-07 NOTE — Telephone Encounter (Signed)
Recommend ciprofloxacin 250 mg twice a day 5 days, office visit if symptoms persist

## 2016-06-07 NOTE — Telephone Encounter (Signed)
You are back up MD) pt saw nancy on 06/04/16 treated for UTI with "Bactrim 800/160 bid for 3 days, pyridium 200mg  TID prn 15 tablets." pt finish bactrim, still has pyridium tablets left, only takes those about twice a day per patient. Pt said she still has lower back discomfort and frequent urination, no burning with urination. Notes having sharp leg pain yesterday evening as well, took OTC and no leg pain today. Pt asked if another round of medication should be prescribed? Please advise

## 2016-06-07 NOTE — Telephone Encounter (Signed)
Left the below on pt voicemail, Rx sent.

## 2016-06-20 ENCOUNTER — Other Ambulatory Visit: Payer: Self-pay | Admitting: Family Medicine

## 2016-06-20 MED FILL — VALACYCLOVIR HCL 500 MG TAB: 500 | 30 days supply | Qty: 16 | Fill #1

## 2016-06-20 NOTE — Telephone Encounter (Signed)
[facility] 

## 2016-06-21 MED FILL — BUPROPION HCL XL 150 MG TAB: 150 | 30 days supply | Qty: 30 | Fill #0

## 2016-06-21 NOTE — Telephone Encounter (Signed)
May have this +3 refills follow-up office visit recommended

## 2016-06-26 ENCOUNTER — Ambulatory Visit (INDEPENDENT_AMBULATORY_CARE_PROVIDER_SITE_OTHER): Payer: 59 | Admitting: Family Medicine

## 2016-06-26 ENCOUNTER — Encounter: Payer: Self-pay | Admitting: Family Medicine

## 2016-06-26 VITALS — Temp 98.9°F | Ht 65.5 in | Wt 150.0 lb

## 2016-06-26 DIAGNOSIS — R197 Diarrhea, unspecified: Secondary | ICD-10-CM

## 2016-06-26 DIAGNOSIS — R1030 Lower abdominal pain, unspecified: Secondary | ICD-10-CM | POA: Diagnosis not present

## 2016-06-26 DIAGNOSIS — R140 Abdominal distension (gaseous): Secondary | ICD-10-CM | POA: Diagnosis not present

## 2016-06-26 MED ORDER — BUPROPION HCL ER (XL) 150 MG PO TB24: 150.0000 mg | ORAL_TABLET | Freq: Every day | ORAL | 3 refills | Status: DC

## 2016-06-26 MED ORDER — DICYCLOMINE HCL 10 MG PO CAPS: 10.0000 mg | ORAL_CAPSULE | Freq: Three times a day (TID) | ORAL | 1 refills | Status: DC

## 2016-06-26 MED FILL — DICYCLOMINE 10 MG CAPSULE: 10 | 20 days supply | Qty: 60 | Fill #0

## 2016-06-26 NOTE — Progress Notes (Signed)
   Subjective:    Patient ID: Ruth Robertson, female    DOB: July 12, 1965, 51 y.o.   MRN: [id number]  Abdominal Pain  This is a new problem. The current episode started yesterday. The pain is located in the generalized abdominal region. Associated symptoms include diarrhea.   Patient is having significant abdominal cramping bloating some nausea some diarrhea no fever chills or sweats denies any blood in her bowel movements. Symptoms been present over the past week she had similar episodes last fall and earlier this spring she thought it was just irritable bowel.  She was seen in ER last year and CAT scan showed thickened small bowel she is had a colonoscopy which showed polyps but no tumors  She is also been treated recently with 2 rounds of antibiotics for UTI   Review of Systems  Gastrointestinal: Positive for abdominal pain and diarrhea.  Denies sweats chills vomiting    [phone] Objective:   Physical Exam Lungs are clear hearts regular abdomen is soft mildly swollen significant tenderness in the lower abdominal region extremities no edema       Assessment & Plan:  Severe lower abdominal pain will test for C. difficile in addition to this lab testing I am concerned about possibility of inflammatory bowel disease patient may need a repeat of her CT scan in more than likely will need further testing by gastroenterology  Patient is been on Wellbutrin for years it helps her with her moods she would like to continue this medicine one years worth a refill given to her

## 2016-06-27 DIAGNOSIS — R197 Diarrhea, unspecified: Secondary | ICD-10-CM | POA: Diagnosis not present

## 2016-06-28 ENCOUNTER — Telehealth: Payer: Self-pay | Admitting: Family Medicine

## 2016-06-28 ENCOUNTER — Telehealth: Payer: Self-pay | Admitting: Gastroenterology

## 2016-06-28 ENCOUNTER — Ambulatory Visit (HOSPITAL_COMMUNITY)
Admission: RE | Admit: 2016-06-28 | Discharge: 2016-06-28 | Disposition: A | Discharge status: Home / Self Care | Payer: 59 | Source: Ambulatory Visit | Attending: Family Medicine | Admitting: Family Medicine

## 2016-06-28 ENCOUNTER — Other Ambulatory Visit (HOSPITAL_COMMUNITY)
Admission: RE | Admit: 2016-06-28 | Discharge: 2016-06-28 | Disposition: A | Discharge status: Home / Self Care | Payer: 59 | Source: Ambulatory Visit | Attending: Family Medicine | Admitting: Family Medicine

## 2016-06-28 DIAGNOSIS — R197 Diarrhea, unspecified: Secondary | ICD-10-CM | POA: Insufficient documentation

## 2016-06-28 DIAGNOSIS — A09 Infectious gastroenteritis and colitis, unspecified: Secondary | ICD-10-CM | POA: Diagnosis not present

## 2016-06-28 DIAGNOSIS — K6389 Other specified diseases of intestine: Secondary | ICD-10-CM | POA: Diagnosis not present

## 2016-06-28 DIAGNOSIS — R1030 Lower abdominal pain, unspecified: Secondary | ICD-10-CM | POA: Insufficient documentation

## 2016-06-28 LAB — HEPATIC FUNCTION PANEL
ALT: 20 IU/L (ref 0–32)
ALT: 24 U/L (ref 14–54)
AST: 17 IU/L (ref 0–40)
AST: 18 U/L (ref 15–41)
Albumin: 4.4 g/dL (ref 3.5–5.5)
Albumin: 4 g/dL (ref 3.5–5.0)
Alkaline Phosphatase: 62 IU/L (ref 39–117)
Alkaline Phosphatase: 51 U/L (ref 38–126)
Bilirubin Total: 0.4 mg/dL (ref 0.0–1.2)
Bilirubin, Direct: 0.1 mg/dL (ref 0.00–0.40)
Bilirubin, Direct: 0.1 mg/dL (ref 0.1–0.5)
Indirect Bilirubin: 0.2 mg/dL — ABNORMAL LOW (ref 0.3–0.9)
Total Bilirubin: 0.3 mg/dL (ref 0.3–1.2)
Total Protein: 6.9 g/dL (ref 6.0–8.5)
Total Protein: 7 g/dL (ref 6.5–8.1)

## 2016-06-28 LAB — CBC WITH DIFFERENTIAL/PLATELET
Basophils Absolute: 0 10*3/uL (ref 0.0–0.2)
Basophils Absolute: 0 10*3/uL (ref 0.0–0.1)
Basophils Relative: 1 %
Basos: 0 %
EOS (ABSOLUTE): 0.3 10*3/uL (ref 0.0–0.4)
Eos: 5 %
Eosinophils Absolute: 0.4 10*3/uL (ref 0.0–0.7)
Eosinophils Relative: 4 %
HCT: 44.2 % (ref 36.0–46.0)
Hematocrit: 47.3 % — ABNORMAL HIGH (ref 34.0–46.6)
Hemoglobin: 15.4 g/dL (ref 11.1–15.9)
Hemoglobin: 14.5 g/dL (ref 12.0–15.0)
Immature Grans (Abs): 0 10*3/uL (ref 0.0–0.1)
Immature Granulocytes: 0 %
Lymphocytes Absolute: 2 10*3/uL (ref 0.7–3.1)
Lymphocytes Relative: 26 %
Lymphs Abs: 2.2 10*3/uL (ref 0.7–4.0)
Lymphs: 27 %
MCH: 30.4 pg (ref 26.6–33.0)
MCH: 30.6 pg (ref 26.0–34.0)
MCHC: 32.6 g/dL (ref 31.5–35.7)
MCHC: 32.8 g/dL (ref 30.0–36.0)
MCV: 94 fL (ref 79–97)
MCV: 93.2 fL (ref 78.0–100.0)
Monocytes Absolute: 0.7 10*3/uL (ref 0.1–0.9)
Monocytes Absolute: 0.6 10*3/uL (ref 0.1–1.0)
Monocytes Relative: 7 %
Monocytes: 9 %
Neutro Abs: 5.1 10*3/uL (ref 1.7–7.7)
Neutrophils Absolute: 4.4 10*3/uL (ref 1.4–7.0)
Neutrophils Relative %: 62 %
Neutrophils: 59 %
Platelets: 271 10*3/uL (ref 150–379)
Platelets: 273 10*3/uL (ref 150–400)
RBC: 5.06 x10E6/uL (ref 3.77–5.28)
RBC: 4.74 MIL/uL (ref 3.87–5.11)
RDW: 13.7 % (ref 12.3–15.4)
RDW: 13.5 % (ref 11.5–15.5)
WBC: 7.5 10*3/uL (ref 3.4–10.8)
WBC: 8.2 10*3/uL (ref 4.0–10.5)

## 2016-06-28 LAB — BASIC METABOLIC PANEL
Anion gap: 8 (ref 5–15)
BUN/Creatinine Ratio: 14 (ref 9–23)
BUN: 12 mg/dL (ref 6–24)
BUN: 13 mg/dL (ref 6–20)
CO2: 21 mmol/L (ref 18–29)
CO2: 25 mmol/L (ref 22–32)
Calcium: 9.3 mg/dL (ref 8.7–10.2)
Calcium: 9.1 mg/dL (ref 8.9–10.3)
Chloride: 105 mmol/L (ref 96–106)
Chloride: 105 mmol/L (ref 101–111)
Creatinine, Ser: 0.87 mg/dL (ref 0.57–1.00)
Creatinine, Ser: 0.77 mg/dL (ref 0.44–1.00)
GFR calc Af Amer: 90 mL/min/{1.73_m2} (ref >59)
GFR calc Af Amer: >60 mL/min (ref >60)
GFR calc non Af Amer: 78 mL/min/{1.73_m2} (ref >59)
GFR calc non Af Amer: >60 mL/min (ref >60)
Glucose, Bld: 99 mg/dL (ref 65–99)
Glucose: 93 mg/dL (ref 65–99)
Potassium: 4.4 mmol/L (ref 3.5–5.2)
Potassium: 3.8 mmol/L (ref 3.5–5.1)
Sodium: 143 mmol/L (ref 134–144)
Sodium: 138 mmol/L (ref 135–145)

## 2016-06-28 LAB — LIPASE, BLOOD: Lipase: 35 U/L (ref 11–51)

## 2016-06-28 LAB — SEDIMENTATION RATE: Sed Rate: 2 mm/hr (ref 0–40)

## 2016-06-28 LAB — LIPASE: Lipase: 91 U/L — ABNORMAL HIGH (ref 14–72)

## 2016-06-28 MED ORDER — IOPAMIDOL (ISOVUE-300) INJECTION 61%
INTRAVENOUS | Status: AC
  Administered 2016-06-28: 30 mL
  Filled 2016-06-28: qty 30

## 2016-06-28 MED ORDER — IOPAMIDOL (ISOVUE-300) INJECTION 61%
100.0000 mL | Freq: Once | INTRAVENOUS | Status: AC | PRN
  Administered 2016-06-28: 100 mL via INTRAVENOUS

## 2016-06-28 NOTE — Telephone Encounter (Signed)
APPOINTMENT MADE AND CALLED PATIENT WITH DATE.

## 2016-06-28 NOTE — Addendum Note (Signed)
Addended by: Ofilia Neas R on: 06/28/2016 10:02 AM   Modules accepted: Orders

## 2016-06-28 NOTE — Telephone Encounter (Signed)
Patient said she was supposed to be set up for a CT scan and Dr. Nicki Reaper told her that if she hadn't heard anything by Friday to call.

## 2016-06-28 NOTE — Telephone Encounter (Signed)
Stat labs CT scan have been ordered patient is aware

## 2016-06-28 NOTE — Telephone Encounter (Signed)
Please advise which type of ct scan pt needs

## 2016-06-28 NOTE — Telephone Encounter (Signed)
Just spoke with Dr. Wolfgang Phoenix. Patient with persistent issues and needs office visit in next 1-2 weeks at the most. He is ordering a CT and further blood work now. We need to get her back in for further evaluation of bloating, diarrhea, abnormal CT, elevated lipase.

## 2016-07-01 LAB — CLOSTRIDIUM DIFFICILE BY PCR: Toxigenic C. Difficile by PCR: NEGATIVE

## 2016-07-03 LAB — STOOL CULTURE: E coli, Shiga toxin Assay: NEGATIVE

## 2016-07-04 ENCOUNTER — Ambulatory Visit (INDEPENDENT_AMBULATORY_CARE_PROVIDER_SITE_OTHER): Payer: 59 | Admitting: Gastroenterology

## 2016-07-04 ENCOUNTER — Other Ambulatory Visit: Payer: Self-pay

## 2016-07-04 ENCOUNTER — Telehealth: Payer: Self-pay

## 2016-07-04 ENCOUNTER — Ambulatory Visit (INDEPENDENT_AMBULATORY_CARE_PROVIDER_SITE_OTHER): Payer: 59 | Admitting: Family Medicine

## 2016-07-04 ENCOUNTER — Encounter: Payer: Self-pay | Admitting: Family Medicine

## 2016-07-04 ENCOUNTER — Encounter: Payer: Self-pay | Admitting: Gastroenterology

## 2016-07-04 VITALS — BP 110/74 | Temp 98.1°F | Ht 65.5 in | Wt 152.0 lb

## 2016-07-04 VITALS — BP 120/80 | HR 70 | Temp 96.9°F | Ht 65.0 in | Wt 151.2 lb

## 2016-07-04 DIAGNOSIS — R935 Abnormal findings on diagnostic imaging of other abdominal regions, including retroperitoneum: Secondary | ICD-10-CM

## 2016-07-04 DIAGNOSIS — B9689 Other specified bacterial agents as the cause of diseases classified elsewhere: Secondary | ICD-10-CM | POA: Diagnosis not present

## 2016-07-04 DIAGNOSIS — J019 Acute sinusitis, unspecified: Secondary | ICD-10-CM

## 2016-07-04 DIAGNOSIS — J069 Acute upper respiratory infection, unspecified: Secondary | ICD-10-CM | POA: Diagnosis not present

## 2016-07-04 MED ORDER — AMOXICILLIN 500 MG PO TABS: 500.0000 mg | ORAL_TABLET | Freq: Three times a day (TID) | ORAL | 0 refills | Status: DC

## 2016-07-04 MED ORDER — ALBUTEROL SULFATE HFA 108 (90 BASE) MCG/ACT IN AERS: 2.0000 | INHALATION_SPRAY | RESPIRATORY_TRACT | 2 refills | Status: DC | PRN

## 2016-07-04 MED FILL — VENTOLIN HFA 90 MCG INHALER: 108 (90 BAS | 17 days supply | Qty: 18 | Fill #0

## 2016-07-04 NOTE — Assessment & Plan Note (Signed)
51 year old female with history of N/V/D in Aug 2017 with CT concerning for Crohn's disease vs gastroenteritis. 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 Recent flare of abdominal pain, nausea, and diarrhea with most recent labs normal lipase, normal LFTs, normal CBC. Lipase just mildly elevated at 91 on 5/24 but not clinically significant. In Aug 2017, lipase 163 but LFTs remaining normal, unclear significance. CT May 25th with mild wall thickening in distal and TI with adjacent inflammatory/edematous change, improved from Aug 2017. Prior celiac serologies negative. Recent episode at time of CT of abdominal bloating, diarrhea, and nausea but not as severe as prior episode, lasting about a week and now almost to baseline of 1 BM a day. Chronic bloating. Patient and mother both concerned about Crohn's disease. TI appeared normal on most recent colonoscopy. With findings on CT of persistent wall thickening of small intestine (although improved), will conclude GI evaluation with an agile capsule study to ensure patency, followed by capsule study. In interim, follow LOW FODMAP diet. Handout provided. May wean off of Bentyl. Further recommendations after AGILE completed.

## 2016-07-04 NOTE — Progress Notes (Signed)
cc'ed to pcp °

## 2016-07-04 NOTE — Patient Instructions (Signed)
We are setting you up for a "dummy" capsule to make sure it passes through your intestines. Then, you will be scheduled for a capsule endoscopy, where pictures will be taken of the small intestine.  Please follow the low FODMAP diet.  Further recommendations to follow!  You may wean off the Bentyl as long as you continue to improve.

## 2016-07-04 NOTE — Progress Notes (Signed)
   Subjective:    Patient ID: Ruth Robertson, female    DOB: 07-04-1965, 51 y.o.   MRN: [id number]  Cough  This is a new problem. The current episode started in the past 7 days. Associated symptoms include nasal congestion, rhinorrhea and a sore throat. Pertinent negatives include no chest pain, ear pain, fever, shortness of breath or wheezing. Treatments tried: Dayquil, Sudafed.   Patient recently being treated for small bowel inflammation possible Crohn's disease is facing a test for next week had had intermittent abdominal swelling pain discomfort and diarrhea States no other concerns this visit.  Review of Systems  Constitutional: Negative for activity change and fever.  HENT: Positive for congestion, rhinorrhea and sore throat. Negative for ear pain.   Eyes: Negative for discharge.  Respiratory: Positive for cough. Negative for shortness of breath and wheezing.   Cardiovascular: Negative for chest pain.       Objective:   Physical Exam  Constitutional: She appears well-developed.  HENT:  Head: Normocephalic.  Nose: Nose normal.  Mouth/Throat: Oropharynx is clear and moist. No oropharyngeal exudate.  Neck: Neck supple.  Cardiovascular: Normal rate and normal heart sounds.   No murmur heard. Pulmonary/Chest: Effort normal and breath sounds normal. She has no wheezes.  Lymphadenopathy:    She has no cervical adenopathy.  Skin: Skin is warm and dry.  Nursing note and vitals reviewed.         Assessment & Plan:  I believe this more of a viral process I don't want to put her on antibiotics currently I told her if her symptoms progress to go ahead and get antibiotic filled but currently to give it a few more days see the goes way of junk  Follow through with gastroenterology alert [place] once they have come to some conclusion follow-up here when necessary

## 2016-07-04 NOTE — Progress Notes (Addendum)
REVIEWED-NO ADDITIONAL RECOMMENDATIONS.  Referring Provider: Kathyrn Drown, MD Primary Care Physician:  Kathyrn Drown, MD Primary GI: Dr. Oneida Alar   Chief Complaint  Patient presents with  . Bloated    HPI:   Ruth Robertson is a 51 y.o. female presenting today with a history of  N/V/D in August, with CT concerning for Crohn's disease vs gastroenteritis. 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 Recent flare of abdominal pain, nausea, and diarrhea with most recent labs normal lipase, normal LFTs, normal CBC. Lipase just mildly elevated at 91 on 5/24 but not clinically significant. In Aug 2017, lipase 163 but LFTs remaining normal. CT May 25th with mild wall thickening in distal and TI with adjacent inflammatory/edematous change, improved from Aug 2017. Prior celiac serologies negative.   States she had acute onset of abdominal bloating and diarrhea, associated with some nausea but not as severe as last year. States when her abdomen was palpated at the PCP's office, it felt "sore" in upper abdomen. Episode lasted about a week. No rectal bleeding. CDI negative, stool culture normal. Feels better now but has a cold now. States baseline bowel habits are usually every day. Will occasionally have constipation. Taking Bentyl now for supportive measures but feels back to normal. Feels like bloating is back to normal but states she feels bloated most of the time. No sick contacts. Had pizza prior to episode. Chronic bloating.  Past Medical History:  Diagnosis Date  . Anxiety   . Asthma    ONLY WITH BRONCHITIS  . GAD (generalized anxiety disorder)   . Pneumonia   . PONV (postoperative nausea and vomiting)     Past Surgical History:  Procedure Laterality Date  . BIOPSY  09/19/2015   Procedure: BIOPSY;  Surgeon: Danie Binder, MD;  Location: [facility];  Service: Endoscopy;;  random colon bx's  . COLONOSCOPY WITH PROPOFOL N/A 09/19/2015   Dr. Oneida Alar: five 2-3 mm polyps in rectum (hyperplastic) and descending colon, diverticulosis in sigmoid colon and transverse colon. Query post-infectious IBS. Next colonoscopy in 10 years   . HYSTEROSCOPY  2006  . POLYPECTOMY  09/19/2015   Procedure: POLYPECTOMY;  Surgeon: Danie Binder, MD;  Location: [facility];  Service: Endoscopy;;  descending colon polyps x3 cold bx, rectal polyp x2  . SALPINGOOPHORECTOMY Bilateral 08/18/2012   Procedure: SALPINGO OOPHORECTOMY;  Surgeon: Anastasio Auerbach, MD;  Location: [facility];  Service: Gynecology;  Laterality: Bilateral;  . VAGINAL HYSTERECTOMY N/A 08/18/2012   Procedure: HYSTERECTOMY VAGINAL;  Surgeon: Anastasio Auerbach, MD;  Location: [facility];  Service: Gynecology;  Laterality: N/A;  . VAGINAL HYSTERECTOMY     Vag.Hyst BSO    Current Outpatient Prescriptions  Medication Sig Dispense Refill  . ALPRAZolam (XANAX) 0.5 MG tablet Take 1 tablet (0.5 mg total) by mouth 2 (two) times daily as needed for anxiety. 30 tablet 3  . buPROPion (WELLBUTRIN XL) 150 MG 24 hr tablet Take 1 tablet (150 mg total) by mouth daily. 90 tablet 3  . dicyclomine (BENTYL) 10 MG capsule Take 1 capsule (10 mg total) by mouth 3 (three) times daily before meals. 60 capsule 1   No current facility-administered medications for this visit.     Allergies as of 07/04/2016  . (No Known Allergies)    Family History  Problem Relation Age of Onset  . Heart failure Father   . Stroke Father   . Breast cancer Maternal Aunt  89's  . Diabetes Paternal Grandfather   . Hypertension Maternal Grandmother   . Colon cancer Maternal Aunt   . Heart attack Mother     Social History   Social History  . Marital status: Divorced    Spouse name: N/A  . Number of children: N/A  . Years of education: N/A   Social History Main Topics  . Smoking status: Current Every Day Smoker    Packs/day: 0.50    Years: 25.00    Types: Cigarettes  . Smokeless tobacco: Never Used  . Alcohol  use 0.0 oz/week     Comment: occassionaly  . Drug use: No  . Sexual activity: Yes    Partners: Male     Comment: HYST-1st intercourse 31 yo-5 partners   Other Topics Concern  . None   Social History Narrative  . None    Review of Systems: Negative as mentioned in HPI   Physical Exam: BP 120/80   Pulse 70   Temp (!) 96.9 F (36.1 C) (Oral)   Ht 5\' 5"  (1.651 m)   Wt 151 lb 3.2 oz (68.6 kg)   LMP 06/12/2012   BMI 25.16 kg/m  General:   Alert and oriented. No distress noted. Pleasant and cooperative.  Head:  Normocephalic and atraumatic. Eyes:  Conjuctiva clear without scleral icterus. Mouth:  Oral mucosa pink and moist. Good dentition. No lesions. Heart:  S1, S2 present without murmurs, rubs, or gallops. Regular rate and rhythm. Abdomen:  +BS, soft, mild TTP epigastric, LLQ and non-distended. No rebound or guarding. No HSM or masses noted. Msk:  Symmetrical without gross deformities. Normal posture. Extremities:  Without edema. Neurologic:  Alert and  oriented x4;  grossly normal neurologically. Psych:  Alert and cooperative. Normal mood and affect.   Lab Results  Component Value Date   WBC 8.2 06/28/2016   HGB 14.5 06/28/2016   HCT 44.2 06/28/2016   MCV 93.2 06/28/2016   PLT 273 06/28/2016   Lab Results  Component Value Date   ALT 24 06/28/2016   AST 18 06/28/2016   ALKPHOS 51 06/28/2016   BILITOT 0.3 06/28/2016   Lab Results  Component Value Date   CREATININE 0.77 06/28/2016   BUN 13 06/28/2016   NA 138 06/28/2016   K 3.8 06/28/2016   CL 105 06/28/2016   CO2 25 06/28/2016   Lab Results  Component Value Date   LIPASE 35 06/28/2016

## 2016-07-04 NOTE — Telephone Encounter (Signed)
Pt scheduled at [facility] for Agile Capsule 07/09/16. Order entered for DG abd 1 view. Pt advised to have x-ray done the day after Agile Capsule.

## 2016-07-09 ENCOUNTER — Encounter (HOSPITAL_COMMUNITY)
Admission: RE | Disposition: A | Discharge status: Home / Self Care | Payer: Self-pay | Source: Ambulatory Visit | Attending: Gastroenterology

## 2016-07-09 ENCOUNTER — Encounter (HOSPITAL_COMMUNITY): Payer: Self-pay | Admitting: *Deleted

## 2016-07-09 ENCOUNTER — Ambulatory Visit (HOSPITAL_COMMUNITY)
Admission: RE | Admit: 2016-07-09 | Discharge: 2016-07-09 | Disposition: A | Discharge status: Home / Self Care | Payer: 59 | Source: Ambulatory Visit | Attending: Gastroenterology | Admitting: Gastroenterology

## 2016-07-09 DIAGNOSIS — R109 Unspecified abdominal pain: Secondary | ICD-10-CM | POA: Diagnosis not present

## 2016-07-09 HISTORY — PX: AGILE CAPSULE: SHX5420

## 2016-07-09 SURGERY — AGILE CAPSULE

## 2016-07-10 ENCOUNTER — Encounter (HOSPITAL_COMMUNITY): Payer: Self-pay | Admitting: Gastroenterology

## 2016-07-10 ENCOUNTER — Ambulatory Visit (HOSPITAL_COMMUNITY)
Admission: RE | Admit: 2016-07-10 | Discharge: 2016-07-10 | Disposition: A | Discharge status: Home / Self Care | Payer: 59 | Source: Ambulatory Visit | Attending: Gastroenterology | Admitting: Gastroenterology

## 2016-07-10 DIAGNOSIS — R935 Abnormal findings on diagnostic imaging of other abdominal regions, including retroperitoneum: Secondary | ICD-10-CM | POA: Diagnosis not present

## 2016-07-10 DIAGNOSIS — T184XXA Foreign body in colon, initial encounter: Secondary | ICD-10-CM | POA: Diagnosis not present

## 2016-07-17 ENCOUNTER — Other Ambulatory Visit: Payer: Self-pay

## 2016-07-17 DIAGNOSIS — R935 Abnormal findings on diagnostic imaging of other abdominal regions, including retroperitoneum: Secondary | ICD-10-CM

## 2016-07-30 ENCOUNTER — Encounter (HOSPITAL_COMMUNITY)
Admission: RE | Disposition: A | Discharge status: Home / Self Care | Payer: Self-pay | Source: Ambulatory Visit | Attending: Gastroenterology

## 2016-07-30 ENCOUNTER — Ambulatory Visit (HOSPITAL_COMMUNITY)
Admission: RE | Admit: 2016-07-30 | Discharge: 2016-07-30 | Disposition: A | Discharge status: Home / Self Care | Payer: 59 | Source: Ambulatory Visit | Attending: Gastroenterology | Admitting: Gastroenterology

## 2016-07-30 DIAGNOSIS — Z79899 Other long term (current) drug therapy: Secondary | ICD-10-CM | POA: Diagnosis not present

## 2016-07-30 DIAGNOSIS — Z792 Long term (current) use of antibiotics: Secondary | ICD-10-CM | POA: Insufficient documentation

## 2016-07-30 DIAGNOSIS — K529 Noninfective gastroenteritis and colitis, unspecified: Secondary | ICD-10-CM

## 2016-07-30 DIAGNOSIS — R935 Abnormal findings on diagnostic imaging of other abdominal regions, including retroperitoneum: Secondary | ICD-10-CM | POA: Diagnosis not present

## 2016-07-30 DIAGNOSIS — R197 Diarrhea, unspecified: Secondary | ICD-10-CM | POA: Insufficient documentation

## 2016-07-30 HISTORY — PX: GIVENS CAPSULE STUDY: SHX5432

## 2016-07-30 SURGERY — IMAGING PROCEDURE, GI TRACT, INTRALUMINAL, VIA CAPSULE

## 2016-08-01 ENCOUNTER — Encounter (HOSPITAL_COMMUNITY): Payer: Self-pay | Admitting: Gastroenterology

## 2016-08-05 ENCOUNTER — Telehealth: Payer: Self-pay | Admitting: Gastroenterology

## 2016-08-05 NOTE — Telephone Encounter (Signed)
Pt is aware I will call her when I get the results.

## 2016-08-05 NOTE — Telephone Encounter (Signed)
Pt called asking if her results from her test done last Tuesday was back yet. Please call [phone]

## 2016-08-09 ENCOUNTER — Telehealth: Payer: Self-pay | Admitting: Family Medicine

## 2016-08-09 DIAGNOSIS — Z029 Encounter for administrative examinations, unspecified: Secondary | ICD-10-CM

## 2016-08-09 MED ORDER — OMEPRAZOLE 20 MG PO CPDR: DELAYED_RELEASE_CAPSULE | ORAL | 3 refills | Status: DC

## 2016-08-09 NOTE — Telephone Encounter (Signed)
Patient had FMLA paper sent to our office that should have went to her specialist Dr. Oneida Alar. Please review old forms that are attached. You referral her to GI specialist and they were supposed to updated any other paper work.I review system and she has been admitted twice in the hosiptal in June by Dr.fields for the same problem.And she wants [place] to fill out paper to cover her on her job so she can get those points removed. I explained that Dr. Oneida Alar needs to fill this out due to she was admitted by her.

## 2016-08-09 NOTE — Procedures (Signed)
PRE-OPERATIVE DIAGNOSIS:  Ileitis on CT   POST-OPERATIVE DIAGNOSIS:  MILD DUODENITIS, NORMAL ILEUM  PROCEDURE:  Procedure(s): GIVENS CAPSULE STUDY  SURGEON:  Surgeon(s): Dorothyann Peng, MD  PATIENT DATA: 150 LBS, HEIGHT: 65 IN, GASTRIC PASSAGE TIME: 1 m, SB PASSAGE TIME: 2H 19 m  RESULTS: LIMITED views of gastric mucosa due to retained contents. ERYTHEMA AND EDEMA IN DUODENAL BULB. NO OLD BLOOD OR FRESH BLOOD seen IN STOMACH, SMALL BOWEL, OR COLON . No masses or AVMs SEEN IN THE SMALL BOWEL. LIMITED VIEWS OF THE COLON DUE TO RETAINED CONTENTS.  DIAGNOSIS: MILD DUODENITIS. INTERMITTENT DIARRHEA/ABDOMINAL PAIN DUE TO POST-INFECTIOUS IBS  Plan: 1. DAILY PPI 2. BENTYL PRN. 3. OPV IN 4 MOS

## 2016-08-09 NOTE — Addendum Note (Signed)
Addended by: Danie Binder on: 08/09/2016 10:04 AM   Modules accepted: Orders

## 2016-08-09 NOTE — Telephone Encounter (Signed)
Reminder in epic °

## 2016-08-09 NOTE — Telephone Encounter (Signed)
PLEASE CALL PT. HER GIVENS SHOWS MILD DUODENITIS. IT CAN CAUSE UPPER ABDOMINAL PAIN. HER INTERMITTENT DIARRHEA/ABDOMINAL PAIN ARE DUE TO POST-INFECTIOUS IBS AND DUODENITIS.  Plan: 1. SHE SHOULD  FOLLOW A LOW FODMAP DIET. SHE CAN PICK UP A HANDOUT.  2. USE BENTYL PRN. 3. ADD OMEPRAZOLE DAILY TO TREAT DUODENITIS 4. OPV IN 4 MOS E31 W. SLF Dx: ABD PAIN/DIARRHEA.

## 2016-08-11 NOTE — Telephone Encounter (Signed)
Dr. Oneida Alar may be able to supply additional information that goes beyond what we can fill in. I did my best to fill it then.

## 2016-08-12 NOTE — Telephone Encounter (Signed)
LMOM to call.

## 2016-08-13 NOTE — Telephone Encounter (Signed)
LMOM to call.

## 2016-08-13 NOTE — Telephone Encounter (Signed)
Pt called and is aware of results and plan.  She is concerned that Omeprazole will ruin her kidneys or her liver. She also asked about the CT that she recently had. She said when she looked at the one in August 2017 and the one in June, she wondered if her problem every really cleared since she has had so much trouble with her stomach. She is aware that Dr. Oneida Alar is on vacation and I am forwarding this note to Roseanne Kaufman, NP who is doing the hospital calls today.                   f

## 2016-08-13 NOTE — Telephone Encounter (Signed)
She has been extensively evaluated. CT was improved from last imaging. This is why we did the capsule study, to make sure there were no occult issues. I have reviewed Dr. Oneida Alar' note and recommendations. She needs to the omeprazole once daily as prescribed. This is not going to ruin her kidneys or liver, and we have her on the lowest appropriate dosing. It is appropriate to take a PPI when it is needed, and this is what has been recommended.

## 2016-08-13 NOTE — Telephone Encounter (Signed)
PT is aware of results.  

## 2016-08-16 MED FILL — BUPROPION HCL XL 150 MG TAB: 150 | 30 days supply | Qty: 30 | Fill #1

## 2016-08-26 ENCOUNTER — Other Ambulatory Visit: Payer: Self-pay | Admitting: Obstetrics & Gynecology

## 2016-08-26 ENCOUNTER — Other Ambulatory Visit: Payer: Self-pay | Admitting: Family Medicine

## 2016-08-26 ENCOUNTER — Ambulatory Visit (INDEPENDENT_AMBULATORY_CARE_PROVIDER_SITE_OTHER): Payer: 59 | Admitting: Obstetrics & Gynecology

## 2016-08-26 ENCOUNTER — Encounter: Payer: Self-pay | Admitting: Obstetrics & Gynecology

## 2016-08-26 VITALS — BP 124/76

## 2016-08-26 DIAGNOSIS — R300 Dysuria: Secondary | ICD-10-CM

## 2016-08-26 DIAGNOSIS — Z1231 Encounter for screening mammogram for malignant neoplasm of breast: Secondary | ICD-10-CM

## 2016-08-26 DIAGNOSIS — R319 Hematuria, unspecified: Secondary | ICD-10-CM | POA: Diagnosis not present

## 2016-08-26 LAB — URINALYSIS W MICROSCOPIC + REFLEX CULTURE
Bilirubin Urine: NEGATIVE
Casts: NONE SEEN [LPF]
Crystals: NONE SEEN [HPF]
Glucose, UA: NEGATIVE
Ketones, ur: NEGATIVE
Nitrite: POSITIVE — AB
Specific Gravity, Urine: >1.03 (ref 1.001–1.035)
Yeast: NONE SEEN [HPF]
pH: 6 (ref 5.0–8.0)

## 2016-08-26 MED ORDER — CIPROFLOXACIN HCL 500 MG PO TABS: 500.0000 mg | ORAL_TABLET | Freq: Two times a day (BID) | ORAL | 0 refills | Status: AC

## 2016-08-26 MED FILL — CIPROFLOXACIN HCL 500 MG TA: 500 | 7 days supply | Qty: 14 | Fill #0

## 2016-08-26 NOTE — Progress Notes (Signed)
     Ruth Robertson 07-Dec-1965 [id number]        51 y.o.  G1P1001  S/P Hysterectomy  RP:  Burning with miction and hematuria  HPI:  C/O Burning with miction.  Saw blood in toilet after passing urine this morning.  Mild lower back pain.  No pelvic pain.  No chills, no fever.  Had an E. Coli Cystitis 06/04/2016, treated with Bactrim.  Frequent loose stools/diarrhea.  Recent Dx of Duodenitis.  H/O IBS/Diverticulosis per Colonoscopy 09/2015.   Past medical history,surgical history, problem list, medications, allergies, family history and social history were all reviewed and documented in the EPIC chart.  Directed ROS with pertinent positives and negatives documented in the history of present illness/assessment and plan.  Exam:  Vitals:   08/26/16 1415  BP: 124/76   General appearance:  Normal  No CVAT  Suprapubic tenderness   Assessment/Plan:  51 y.o. G1P1001   1. Dysuria U/A confirms Cystitis.  Ciprofloxacin 500 mg BID x 7 days sent to pharmacy.  Pending U. Culture.  Precautions to prevent recurrent Cystitis discussed. - Urinalysis with Culture Reflex  2. Hematuria, unspecified type As above - Urinalysis with Culture Reflex  Counseling on above issues >50% x 15 minutes  Princess Bruins MD, 2:44 PM 08/26/2016

## 2016-08-27 ENCOUNTER — Ambulatory Visit (INDEPENDENT_AMBULATORY_CARE_PROVIDER_SITE_OTHER): Payer: 59 | Admitting: Family Medicine

## 2016-08-27 ENCOUNTER — Ambulatory Visit (HOSPITAL_COMMUNITY)
Admission: RE | Admit: 2016-08-27 | Discharge: 2016-08-27 | Disposition: A | Discharge status: Home / Self Care | Payer: 59 | Source: Ambulatory Visit | Attending: Family Medicine | Admitting: Family Medicine

## 2016-08-27 ENCOUNTER — Encounter: Payer: Self-pay | Admitting: Family Medicine

## 2016-08-27 VITALS — BP 108/72 | Ht 65.5 in | Wt 149.0 lb

## 2016-08-27 DIAGNOSIS — K2980 Duodenitis without bleeding: Secondary | ICD-10-CM

## 2016-08-27 DIAGNOSIS — N3001 Acute cystitis with hematuria: Secondary | ICD-10-CM | POA: Diagnosis not present

## 2016-08-27 DIAGNOSIS — M545 Low back pain, unspecified: Secondary | ICD-10-CM

## 2016-08-27 DIAGNOSIS — M4186 Other forms of scoliosis, lumbar region: Secondary | ICD-10-CM | POA: Diagnosis not present

## 2016-08-27 DIAGNOSIS — M47816 Spondylosis without myelopathy or radiculopathy, lumbar region: Secondary | ICD-10-CM | POA: Insufficient documentation

## 2016-08-27 DIAGNOSIS — M8580 Other specified disorders of bone density and structure, unspecified site: Secondary | ICD-10-CM | POA: Diagnosis not present

## 2016-08-27 NOTE — Progress Notes (Signed)
   Subjective:    Patient ID: Ruth Robertson, female    DOB: 1965/02/11, 51 y.o.   MRN: [id number]  HPI Pt states she is here today to follow up on the agile capsule that she had done with Marylee Floras in June 2018.  She states she had a capsule study done they called her told her it was doing tinnitus and told her to take a PPI otherwise they did not have any other insight or commentary this is been frustrating for the patient States she saw OBGYN yesterday and she had some hematuria. They think it was urinary tract infection they did culture and put her on antibiotics Having back pain that has been going on for months.Takes advil for it and it helps some. The pain does not wake her up at night does not radiate down the legs it's mainly in the right lower back it's been present for 4 months she is a smoker she denies any bleeding issues denies constipation issues    Review of Systems Denies chest tightness pressure pain shortness breath she does relate intermittent abdominal pain intermittent abdominal swelling denies rectal bleeding relates low back pain no sciatica    Objective:   Physical Exam  Neck no masses Lungs are clear no crackles respiratory rate normal Heart regular no murmurs Abdomen soft no guarding rebound or tenderness Low back some tenderness in the right sacroiliac area Negative straight leg raise      Assessment & Plan:  Duodenitis-continue PPI May need consultation with different gastroenterology, patient understandably frustrated by a lack of an immediate follow-up with discussion of what the capsule study showed Recent UTI being treated by gynecology patient was told that if the urine culture did not show any growth she would need to have a follow-up urinalysis in that she could do that with [place] or gynecology patient voiced understanding Sacroiliac pain and discomfort gentle stretching exercises Tylenol x-rays indicated because of her age  Patient  will follow-up here when necessary

## 2016-08-27 NOTE — Patient Instructions (Signed)

## 2016-08-28 LAB — URINE CULTURE

## 2016-08-28 NOTE — Patient Instructions (Signed)
1. Dysuria U/A confirms Cystitis.  Ciprofloxacin 500 mg BID x 7 days sent to pharmacy.  Pending U. Culture.  Precautions to prevent recurrent Cystitis discussed. - Urinalysis with Culture Reflex  2. Hematuria, unspecified type As above - Urinalysis with Culture Reflex  Belva, it was a pleasure to meet you today!  I will inform you of your Urine culture result as soon as available.

## 2016-08-29 ENCOUNTER — Telehealth: Payer: Self-pay | Admitting: Family Medicine

## 2016-08-29 NOTE — Telephone Encounter (Signed)
Patient is on the rght antibiotic.  She should clear up with this medicine.Please inform patient either by phone call or by my chart

## 2016-08-29 NOTE — Telephone Encounter (Signed)
Pt called to say that her urine culture results are back (her GYN ordered & is treating her with antibiotics) She states that Dr. Nicki Reaper wanted to know when results were back so he could make sure that she was being treated with the right antibiotics  Please advise

## 2016-08-29 NOTE — Telephone Encounter (Signed)
Spoke with patient and informed her per Dr.Scott Luking- Patient is on the rght antibiotic.  She should clear up with this medicine. Patient verbalized understanding.

## 2016-09-01 ENCOUNTER — Telehealth: Payer: Self-pay | Admitting: Family Medicine

## 2016-09-01 DIAGNOSIS — R109 Unspecified abdominal pain: Secondary | ICD-10-CM

## 2016-09-01 NOTE — Telephone Encounter (Signed)
Please go ahead with arranging for second opinion regarding her reoccurring abdominal pain and discomfort. She would prefer [facility] gastroenterology [facility] or [facility]-please put him referral then forward this to [facility]

## 2016-09-02 NOTE — Addendum Note (Signed)
Addended by: Dairl Ponder on: 09/02/2016 08:41 AM   Modules accepted: Orders

## 2016-09-02 NOTE — Telephone Encounter (Signed)
Referral ordered in EPIC. 

## 2016-09-10 ENCOUNTER — Telehealth: Payer: Self-pay | Admitting: Gastroenterology

## 2016-09-10 ENCOUNTER — Encounter: Payer: Self-pay | Admitting: Family Medicine

## 2016-09-12 ENCOUNTER — Telehealth: Payer: Self-pay | Admitting: Family Medicine

## 2016-09-12 DIAGNOSIS — K529 Noninfective gastroenteritis and colitis, unspecified: Secondary | ICD-10-CM

## 2016-09-12 NOTE — Telephone Encounter (Signed)
More than likely this is them stating that they don't want to get involved. Unfortunately I cannot force them to see the patient. Her options are going back to Dr. Oneida Alar office for further discussion or we can help set her up with gastroenterology at a [facility] for a second opinion such as [facility] or [facility]

## 2016-09-12 NOTE — Telephone Encounter (Signed)
Dr.Nandigam reviewed records and declined to accept patient. Left message for patient notifying her of this.

## 2016-09-12 NOTE — Telephone Encounter (Signed)
Pt called stating that [facility] called her and told her that they reviewed her records and there was nothing that they could do for her. Pt wants to know what she is to do know. Please advise.

## 2016-09-12 NOTE — Telephone Encounter (Signed)
Discussed with pt. Pt states she will call back with whom she wants to see.

## 2016-09-13 NOTE — Telephone Encounter (Signed)
Patient said she wants to go to either [facility] or [facility].  Whoever Dr. Wolfgang Phoenix thinks is going to work best.  She said she doesn't want to go to [facility].

## 2016-09-16 NOTE — Telephone Encounter (Signed)
I would recommend [name] because of reoccurring small bowel inflammation, abdominal swelling, colitis issues.(It will be necessary for records from other gastroenterologists as well as from our office including CAT scans and lab work be forwarded to [facility]. It is also important for [name] to inform the patient that this information is in the electronic record that 2 doctors conceived this through care everywhere feature available on Epic)

## 2016-09-17 MED FILL — BUPROPION HCL XL 150 MG TAB: 150 | 30 days supply | Qty: 30 | Fill #2

## 2016-09-17 MED FILL — OMEPRAZOLE DR 20 MG CAPSULE: 20 | 90 days supply | Qty: 90 | Fill #0

## 2016-09-17 NOTE — Addendum Note (Signed)
Addended by: Dairl Ponder on: 09/17/2016 09:32 AM   Modules accepted: Orders

## 2016-09-23 ENCOUNTER — Encounter: Payer: Self-pay | Admitting: Family Medicine

## 2016-09-30 ENCOUNTER — Ambulatory Visit
Admission: RE | Admit: 2016-09-30 | Discharge: 2016-09-30 | Disposition: A | Discharge status: Home / Self Care | Payer: 59 | Source: Ambulatory Visit | Attending: Family Medicine | Admitting: Family Medicine

## 2016-09-30 ENCOUNTER — Telehealth: Payer: Self-pay | Admitting: Family Medicine

## 2016-09-30 DIAGNOSIS — Z1231 Encounter for screening mammogram for malignant neoplasm of breast: Secondary | ICD-10-CM | POA: Diagnosis not present

## 2016-09-30 DIAGNOSIS — R252 Cramp and spasm: Secondary | ICD-10-CM

## 2016-09-30 NOTE — Telephone Encounter (Signed)
Patient advised Gentle stretching exercises can help, Zanaflex 2 mg 1 every 8 hours when necessary muscle spasms for home use only in the evenings, metabolic 7 with magnesium testing recommended, finally may try mustard (1 tsp) when necessary. Patient verbalized understanding and would like to try mustard for now and hold on muscle relaxer. Blood work ordered in [facility].

## 2016-09-30 NOTE — Telephone Encounter (Signed)
Gentle stretching exercises can help, Zanaflex 2 mg 1 every 8 hours when necessary muscle spasms for home use only in the evenings, metabolic 7 with magnesium testing recommended, finally may try mustard (1 tsp) when necessary

## 2016-09-30 NOTE — Telephone Encounter (Signed)
Patient states over the weekend feet were cramping real bad and now legs are real sore.She wants to know what see can take.She states this has happen before .

## 2016-10-02 DIAGNOSIS — R252 Cramp and spasm: Secondary | ICD-10-CM | POA: Diagnosis not present

## 2016-10-03 LAB — BASIC METABOLIC PANEL
BUN/Creatinine Ratio: 11 (ref 9–23)
BUN: 12 mg/dL (ref 6–24)
CO2: 25 mmol/L (ref 20–29)
Calcium: 9.6 mg/dL (ref 8.7–10.2)
Chloride: 102 mmol/L (ref 96–106)
Creatinine, Ser: 1.06 mg/dL — ABNORMAL HIGH (ref 0.57–1.00)
GFR calc Af Amer: 71 mL/min/{1.73_m2} (ref >59)
GFR calc non Af Amer: 61 mL/min/{1.73_m2} (ref >59)
Glucose: 85 mg/dL (ref 65–99)
Potassium: 4.2 mmol/L (ref 3.5–5.2)
Sodium: 142 mmol/L (ref 134–144)

## 2016-10-03 LAB — MAGNESIUM: Magnesium: 1.9 mg/dL (ref 1.6–2.3)

## 2016-11-06 ENCOUNTER — Encounter: Payer: Self-pay | Admitting: Gastroenterology

## 2016-11-28 ENCOUNTER — Encounter: Payer: Self-pay | Admitting: Gynecology

## 2016-11-28 ENCOUNTER — Ambulatory Visit (INDEPENDENT_AMBULATORY_CARE_PROVIDER_SITE_OTHER): Payer: 59 | Admitting: Gynecology

## 2016-11-28 VITALS — BP 116/76 | Ht 66.0 in | Wt 155.0 lb

## 2016-11-28 DIAGNOSIS — Z01419 Encounter for gynecological examination (general) (routine) without abnormal findings: Secondary | ICD-10-CM | POA: Diagnosis not present

## 2016-11-28 NOTE — Addendum Note (Signed)
Addended by: Nelva Nay on: 11/28/2016 04:46 PM   Modules accepted: Orders

## 2016-11-28 NOTE — Progress Notes (Signed)
    Ruth Robertson [birth year] [id number]        51 y.o.  G1P1001 for annual gynecologic exam.    Past medical history,surgical history, problem list, medications, allergies, family history and social history were all reviewed and documented as reviewed in the EPIC chart.  ROS:  Performed with pertinent positives and negatives included in the history, assessment and plan.   Additional significant findings : None   Exam: [name] assistant Vitals:   11/28/16 1604  BP: 116/76  Weight: 155 lb (70.3 kg)  Height: 5\' 6"  (1.676 m)   Body mass index is 25.02 kg/m.  General appearance:  Normal affect, orientation and appearance. Skin: Grossly normal HEENT: Without gross lesions.  No cervical or supraclavicular adenopathy. Thyroid normal.  Lungs:  Clear without wheezing, rales or rhonchi Cardiac: RR, without RMG Abdominal:  Soft, nontender, without masses, guarding, rebound, organomegaly or hernia Breasts:  Examined lying and sitting without masses, retractions, discharge or axillary adenopathy. Pelvic:  Ext, BUS, Vagina: Normal with atrophic changes  Adnexa: Without masses or tenderness    Anus and perineum: Normal   Rectovaginal: Normal sphincter tone without palpated masses or tenderness.    Assessment/Plan:  51 y.o. G56P1001 female for annual gynecologic exam.   1. Perimenopausal.  Status post TVH BSO 2014.  Transient use of ERT but has stopped it.  Has occasional hot flushes and sweats but overall is doing well and does not want to consider any treatments. 2. Bone health.  Patient on extra calcium to her primary physician's office.  Remembers being told something about osteopenia but cannot remember she actually had a bone density study done.  She is going to check with his office and if she had a bone density get me a copy of this to review.  Otherwise she will continue to follow-up with him in reference to her bone health. 3. Mammography 09/2016.  Continue with annual  mammography when due.  SBE monthly reviewed.  Breast exam normal today. 4. Colonoscopy 2017.  Repeat at their recommended interval. 5. Pap smear 2015.  Pap smear done today.  No history of abnormal Pap smears.  We discussed options to stop screening per current screening guidelines based on hysterectomy history.  Will readdress on an annual basis. 6. Health maintenance.  No routine lab work done as patient reports this done elsewhere.  Follow-up in 1 year, sooner as needed.   Anastasio Auerbach MD, 4:39 PM 11/28/2016

## 2016-11-28 NOTE — Patient Instructions (Signed)
Check with your primary physician to see if they did a bone density study.  If they did then see if you can get a copy for me.  Follow-up in 1 year for annual exam.

## 2016-12-02 LAB — PAP IG W/ RFLX HPV ASCU

## 2016-12-05 MED FILL — BUPROPION HCL XL 150 MG TAB: 150 | 30 days supply | Qty: 30 | Fill #3

## 2016-12-25 ENCOUNTER — Encounter: Payer: Self-pay | Admitting: Family Medicine

## 2016-12-25 ENCOUNTER — Ambulatory Visit (INDEPENDENT_AMBULATORY_CARE_PROVIDER_SITE_OTHER): Payer: 59 | Admitting: Family Medicine

## 2016-12-25 VITALS — BP 124/72 | Ht 66.0 in | Wt 152.0 lb

## 2016-12-25 DIAGNOSIS — M545 Low back pain, unspecified: Secondary | ICD-10-CM

## 2016-12-25 DIAGNOSIS — G8929 Other chronic pain: Secondary | ICD-10-CM

## 2016-12-25 MED ORDER — CHLORZOXAZONE 500 MG PO TABS: 500.0000 mg | ORAL_TABLET | Freq: Three times a day (TID) | ORAL | 2 refills | Status: DC | PRN

## 2016-12-25 MED ORDER — DICLOFENAC SODIUM 75 MG PO TBEC: DELAYED_RELEASE_TABLET | ORAL | 2 refills | Status: DC

## 2016-12-25 NOTE — Progress Notes (Signed)
   Subjective:    Patient ID: Ruth Robertson, female    DOB: December 17, 1965, 51 y.o.   MRN: [id number]  Back Pain  This is a new problem. The current episode started more than 1 month ago. The pain is present in the lumbar spine. Radiates to: both legs. The symptoms are aggravated by twisting. Treatments tried: ibuprofen. The treatment provided mild relief.   Results for orders placed or performed in visit on 11/28/16  Pap IG w/ reflex to HPV when ASC-U  Result Value Ref Range   Clinical Information:     LMP:     PREV. PAP:     PREV. BX:     HPV DNA Probe-Source     STATEMENT OF ADEQUACY:     INTERPRETATION/RESULT:     Comment:     CYTOTECHNOLOGIST:       mon and tue pt had severe pain  Worse in the moning thn at night    Low back pain diffuse in natur   bilat low back pain raidtes bilat in the thigh  mon morn awoke painful, did do incr activity of little on g baby this weekedn   Did low back exercises for awhile, stays active, , min imal discomfort one yr ago  No major activity probles       Review of Systems  Musculoskeletal: Positive for back pain.       Objective:   Physical Exam Alert vitals stable, NAD. Blood pressure good on repeat. HEENT normal. Lungs clear. Heart regular rate and rhythm. Diffuse low back tenderness to palpation.  Negative sciatic notch tenderness.  Negative straight leg raise.  Negative spinal tenderness  X-rays reviewed       Assessment & Plan:  Impression acute lumbar strain superimposed upon chronic low back pain.  Long discussion held.  Regular exercise and stretching crucial.  Add as needed anti-inflammatory/spasm agent symptom care discussed

## 2016-12-27 ENCOUNTER — Other Ambulatory Visit: Payer: Self-pay | Admitting: *Deleted

## 2016-12-27 MED ORDER — DICLOFENAC SODIUM 75 MG PO TBEC: DELAYED_RELEASE_TABLET | ORAL | 2 refills | Status: DC

## 2017-01-17 ENCOUNTER — Telehealth: Payer: Self-pay

## 2017-01-17 MED ORDER — DICLOFENAC SODIUM 75 MG PO TBEC: 75.0000 mg | DELAYED_RELEASE_TABLET | Freq: Two times a day (BID) | ORAL | 0 refills | Status: DC

## 2017-01-17 NOTE — Telephone Encounter (Signed)
[facility] is requesting diclofenac refill one tablet BID. Prn pain. Please advise if ok to fill.Thanks,CS

## 2017-01-17 NOTE — Telephone Encounter (Signed)
I called in ref

## 2017-01-17 NOTE — Addendum Note (Signed)
Addended by: Mikey Kirschner on: 01/17/2017 05:44 PM   Modules accepted: Orders

## 2017-02-06 ENCOUNTER — Other Ambulatory Visit: Payer: Self-pay | Admitting: Family Medicine

## 2017-02-06 MED FILL — ALPRAZolam 0.5 MG TABS: 0.5 | 15 days supply | Qty: 30 | Fill #0

## 2017-02-06 MED FILL — BUPROPION HCL XL 150 MG TAB: 150 | 90 days supply | Qty: 90 | Fill #0

## 2017-02-06 NOTE — Telephone Encounter (Signed)
May have this +1 refill needs follow-up office visit specifically for this medication

## 2017-02-12 ENCOUNTER — Other Ambulatory Visit: Payer: Self-pay | Admitting: *Deleted

## 2017-02-12 MED ORDER — DICLOFENAC SODIUM 75 MG PO TBEC: 75.0000 mg | DELAYED_RELEASE_TABLET | Freq: Two times a day (BID) | ORAL | 0 refills | Status: DC

## 2017-02-18 ENCOUNTER — Other Ambulatory Visit: Payer: Self-pay | Admitting: *Deleted

## 2017-02-18 MED FILL — OMEPRAZOLE 20 MG CAP: 20 | 90 days supply | Qty: 90 | Fill #1

## 2017-02-19 MED ORDER — OMEPRAZOLE 20 MG PO CPDR: DELAYED_RELEASE_CAPSULE | ORAL | 2 refills | Status: DC

## 2017-02-26 DIAGNOSIS — R194 Change in bowel habit: Secondary | ICD-10-CM | POA: Diagnosis not present

## 2017-02-26 DIAGNOSIS — R140 Abdominal distension (gaseous): Secondary | ICD-10-CM | POA: Diagnosis not present

## 2017-02-26 DIAGNOSIS — R635 Abnormal weight gain: Secondary | ICD-10-CM | POA: Diagnosis not present

## 2017-03-23 ENCOUNTER — Telehealth: Payer: Self-pay | Admitting: Family Medicine

## 2017-03-23 NOTE — Telephone Encounter (Signed)
The patient was seen by gastroenterology at [facility].  Please send a copy of the colonoscopy and thyroid function to Dr. Willodean Rosenthal MD with [facility] gastroenterology.  Thank you

## 2017-05-08 MED FILL — OMEPRAZOLE 20 MG CAP: 20 | 90 days supply | Qty: 90 | Fill #2

## 2017-06-17 ENCOUNTER — Encounter: Payer: Self-pay | Admitting: Family Medicine

## 2017-06-17 ENCOUNTER — Ambulatory Visit (INDEPENDENT_AMBULATORY_CARE_PROVIDER_SITE_OTHER): Payer: 59 | Admitting: Family Medicine

## 2017-06-17 VITALS — BP 112/80 | Ht 66.0 in | Wt 156.0 lb

## 2017-06-17 DIAGNOSIS — A084 Viral intestinal infection, unspecified: Secondary | ICD-10-CM

## 2017-06-17 MED ORDER — ONDANSETRON 4 MG PO TBDP: ORAL_TABLET | ORAL | 0 refills | Status: DC

## 2017-06-17 MED ORDER — DIPHENOXYLATE-ATROPINE 2.5-0.025 MG PO TABS: ORAL_TABLET | ORAL | 0 refills | Status: DC

## 2017-06-17 NOTE — Progress Notes (Signed)
   Subjective:    Patient ID: Ruth Robertson, female    DOB: 1965-09-18, 52 y.o.   MRN: [id number]  HPI  Patient here today with complaints of vomiting and diarrhea with abdominal pain since last night. She has been taking imodium.  Stomach started gugling  vom twice  Diarrhea  Has had before    Pt went into hosp two yrs ago for it     No known exposure  Had steak and shrimp, ate leftovers.,  eveyone else ok  No fever  Energy and appetite none     Review of Systems No hot no high fevers no headache no rash    Objective:   Physical Exam  Alert active hydration decent mild malaise.  HEENT normal lungs clear.  Heart regular rate and rhythm.  Abdomen hyperactive bowel sounds but no discrete tenderness just  diffuse discomfort      Assessment & Plan:  1 impression viral gastroenteritis versus food poisoning leaning towards viral gastroenteritis discussed plan diet discussed warning signs discussed Zofran as needed.  Lomotil as needed.

## 2017-06-19 ENCOUNTER — Other Ambulatory Visit: Payer: Self-pay

## 2017-06-19 ENCOUNTER — Telehealth: Payer: Self-pay | Admitting: *Deleted

## 2017-06-19 ENCOUNTER — Encounter (HOSPITAL_COMMUNITY): Payer: Self-pay | Admitting: Emergency Medicine

## 2017-06-19 ENCOUNTER — Emergency Department (HOSPITAL_COMMUNITY): Payer: 59

## 2017-06-19 ENCOUNTER — Inpatient Hospital Stay (HOSPITAL_COMMUNITY)
Admission: EM | Admit: 2017-06-19 | Discharge: 2017-06-20 | DRG: 390 | Disposition: A | Discharge status: Home / Self Care | Payer: 59 | Attending: Internal Medicine | Admitting: Internal Medicine

## 2017-06-19 DIAGNOSIS — R112 Nausea with vomiting, unspecified: Secondary | ICD-10-CM | POA: Diagnosis not present

## 2017-06-19 DIAGNOSIS — F411 Generalized anxiety disorder: Secondary | ICD-10-CM | POA: Diagnosis present

## 2017-06-19 DIAGNOSIS — Z720 Tobacco use: Secondary | ICD-10-CM

## 2017-06-19 DIAGNOSIS — Z90710 Acquired absence of both cervix and uterus: Secondary | ICD-10-CM

## 2017-06-19 DIAGNOSIS — Z79899 Other long term (current) drug therapy: Secondary | ICD-10-CM

## 2017-06-19 DIAGNOSIS — R1110 Vomiting, unspecified: Secondary | ICD-10-CM | POA: Diagnosis not present

## 2017-06-19 DIAGNOSIS — J45909 Unspecified asthma, uncomplicated: Secondary | ICD-10-CM | POA: Diagnosis present

## 2017-06-19 DIAGNOSIS — K56600 Partial intestinal obstruction, unspecified as to cause: Secondary | ICD-10-CM | POA: Diagnosis not present

## 2017-06-19 DIAGNOSIS — F17210 Nicotine dependence, cigarettes, uncomplicated: Secondary | ICD-10-CM | POA: Diagnosis present

## 2017-06-19 DIAGNOSIS — R1084 Generalized abdominal pain: Secondary | ICD-10-CM | POA: Diagnosis not present

## 2017-06-19 DIAGNOSIS — K529 Noninfective gastroenteritis and colitis, unspecified: Secondary | ICD-10-CM | POA: Diagnosis not present

## 2017-06-19 DIAGNOSIS — K589 Irritable bowel syndrome without diarrhea: Secondary | ICD-10-CM | POA: Diagnosis present

## 2017-06-19 DIAGNOSIS — F329 Major depressive disorder, single episode, unspecified: Secondary | ICD-10-CM | POA: Diagnosis present

## 2017-06-19 DIAGNOSIS — R109 Unspecified abdominal pain: Secondary | ICD-10-CM | POA: Diagnosis not present

## 2017-06-19 LAB — COMPREHENSIVE METABOLIC PANEL
ALT: 23 U/L (ref 14–54)
AST: 17 U/L (ref 15–41)
Albumin: 4.5 g/dL (ref 3.5–5.0)
Alkaline Phosphatase: 63 U/L (ref 38–126)
Anion gap: 12 (ref 5–15)
BUN: 14 mg/dL (ref 6–20)
CO2: 25 mmol/L (ref 22–32)
Calcium: 9.2 mg/dL (ref 8.9–10.3)
Chloride: 101 mmol/L (ref 101–111)
Creatinine, Ser: 1 mg/dL (ref 0.44–1.00)
GFR calc Af Amer: >60 mL/min (ref >60)
GFR calc non Af Amer: >60 mL/min (ref >60)
Glucose, Bld: 118 mg/dL — ABNORMAL HIGH (ref 65–99)
Potassium: 3.8 mmol/L (ref 3.5–5.1)
Sodium: 138 mmol/L (ref 135–145)
Total Bilirubin: 1 mg/dL (ref 0.3–1.2)
Total Protein: 8.1 g/dL (ref 6.5–8.1)

## 2017-06-19 LAB — URINALYSIS, ROUTINE W REFLEX MICROSCOPIC
Bacteria, UA: NONE SEEN
Bilirubin Urine: NEGATIVE
Glucose, UA: NEGATIVE mg/dL
Ketones, ur: 20 mg/dL — AB
Leukocytes, UA: NEGATIVE
Nitrite: NEGATIVE
Protein, ur: NEGATIVE mg/dL
Specific Gravity, Urine: 1.03 (ref 1.005–1.030)
pH: 6 (ref 5.0–8.0)

## 2017-06-19 LAB — LIPASE, BLOOD: Lipase: 32 U/L (ref 11–51)

## 2017-06-19 LAB — CBC
HCT: 52.5 % — ABNORMAL HIGH (ref 36.0–46.0)
Hemoglobin: 17.2 g/dL — ABNORMAL HIGH (ref 12.0–15.0)
MCH: 30.1 pg (ref 26.0–34.0)
MCHC: 32.8 g/dL (ref 30.0–36.0)
MCV: 91.8 fL (ref 78.0–100.0)
Platelets: 261 10*3/uL (ref 150–400)
RBC: 5.72 MIL/uL — ABNORMAL HIGH (ref 3.87–5.11)
RDW: 13.5 % (ref 11.5–15.5)
WBC: 4.4 10*3/uL (ref 4.0–10.5)

## 2017-06-19 LAB — RAPID URINE DRUG SCREEN, HOSP PERFORMED
Amphetamines: NOT DETECTED
Barbiturates: NOT DETECTED
Benzodiazepines: NOT DETECTED
Cocaine: NOT DETECTED
Opiates: NOT DETECTED
Tetrahydrocannabinol: NOT DETECTED

## 2017-06-19 LAB — C DIFFICILE QUICK SCREEN W PCR REFLEX
C Diff antigen: NEGATIVE
C Diff interpretation: NOT DETECTED
C Diff toxin: NEGATIVE

## 2017-06-19 MED ORDER — IOPAMIDOL (ISOVUE-300) INJECTION 61%
100.0000 mL | Freq: Once | INTRAVENOUS | Status: AC | PRN
  Administered 2017-06-19: 100 mL via INTRAVENOUS

## 2017-06-19 MED ORDER — METOCLOPRAMIDE HCL 5 MG/ML IJ SOLN
10.0000 mg | Freq: Once | INTRAMUSCULAR | Status: AC
  Administered 2017-06-19: 10 mg via INTRAVENOUS
  Filled 2017-06-19: qty 2

## 2017-06-19 MED ORDER — POTASSIUM CHLORIDE IN NACL 20-0.9 MEQ/L-% IV SOLN
INTRAVENOUS | Status: DC
  Administered 2017-06-19 – 2017-06-20 (×2): via INTRAVENOUS

## 2017-06-19 MED ORDER — BUPROPION HCL ER (XL) 150 MG PO TB24
150.0000 mg | ORAL_TABLET | Freq: Every day | ORAL | Status: DC
  Administered 2017-06-19 – 2017-06-20 (×2): 150 mg via ORAL
  Filled 2017-06-19 (×2): qty 1

## 2017-06-19 MED ORDER — ONDANSETRON 4 MG PO TBDP
ORAL_TABLET | ORAL | Status: AC
  Filled 2017-06-19: qty 1

## 2017-06-19 MED ORDER — ALPRAZOLAM 0.5 MG PO TABS: 0.5000 mg | ORAL_TABLET | Freq: Two times a day (BID) | ORAL | Status: DC | PRN

## 2017-06-19 MED ORDER — ONDANSETRON 4 MG PO TBDP
4.0000 mg | ORAL_TABLET | Freq: Once | ORAL | Status: AC | PRN
  Administered 2017-06-19: 4 mg via ORAL

## 2017-06-19 MED ORDER — PANTOPRAZOLE SODIUM 40 MG PO TBEC
40.0000 mg | DELAYED_RELEASE_TABLET | Freq: Every day | ORAL | Status: DC
  Administered 2017-06-19 – 2017-06-20 (×2): 40 mg via ORAL
  Filled 2017-06-19 (×2): qty 1

## 2017-06-19 MED ORDER — ONDANSETRON HCL 4 MG/2ML IJ SOLN
4.0000 mg | Freq: Four times a day (QID) | INTRAMUSCULAR | Status: DC
  Administered 2017-06-19 – 2017-06-20 (×4): 4 mg via INTRAVENOUS
  Filled 2017-06-19 (×4): qty 2

## 2017-06-19 MED ORDER — CHLORZOXAZONE 500 MG PO TABS
500.0000 mg | ORAL_TABLET | Freq: Three times a day (TID) | ORAL | Status: DC | PRN
Start: 2017-06-19 — End: 2017-06-20
  Filled 2017-06-19: qty 1

## 2017-06-19 MED ORDER — SODIUM CHLORIDE 0.9 % IV BOLUS
1000.0000 mL | Freq: Once | INTRAVENOUS | Status: AC
  Administered 2017-06-19: 1000 mL via INTRAVENOUS

## 2017-06-19 MED ORDER — ENOXAPARIN SODIUM 40 MG/0.4ML ~~LOC~~ SOLN: 40.0000 mg | SUBCUTANEOUS | Status: DC

## 2017-06-19 MED ORDER — ACETAMINOPHEN 650 MG RE SUPP: 650.0000 mg | Freq: Four times a day (QID) | RECTAL | Status: DC | PRN

## 2017-06-19 MED ORDER — ACETAMINOPHEN 325 MG PO TABS
650.0000 mg | ORAL_TABLET | Freq: Four times a day (QID) | ORAL | Status: DC | PRN
  Administered 2017-06-19: 650 mg via ORAL
  Filled 2017-06-19: qty 2

## 2017-06-19 NOTE — ED Triage Notes (Signed)
Pt reports she was seen for viral gastroenteritis on Monday and given Zofran and lomotil with no improvement. V/ X1 and D/ x3. Zofran taken PTA.

## 2017-06-19 NOTE — ED Notes (Signed)
Pt states she urinated on herself. Pt and family provided with wash cloths, paper scrub pants, and bed bath pan. Family requested wipes and notified the hospital does not carry them. Pt's family member stated the washcloths would be ok. Pt escorted to restroom.

## 2017-06-19 NOTE — ED Notes (Signed)
ED RN director notified of pt and her requests.

## 2017-06-19 NOTE — Telephone Encounter (Signed)
Pt seen two days ago for viral gastroenteritis verus food poisoning. Pt called today and not any better. She states she is in severe pain and stomach is swollen. Eating and drinking very little for past two days. Having vomiting and diarrhea. Pt advised to go to ED. She [facility]. Tried calling [facility] tri[facility] twice no answer.

## 2017-06-19 NOTE — H&P (Signed)
History and Physical  Ruth Robertson GYI:[id number] DOB: May 04, 1965 DOA: 06/19/2017   PCP: Kathyrn Drown, MD   Patient coming from: Home  Chief Complaint: Nausea, vomiting, and diarrhea  HPI:  Ruth Robertson is a 52 y.o. female with medical history of tobacco abuse, irritable bowel syndrome, depression/anxiety presenting with 3-day history of nausea, vomiting, and diarrhea.  The patient had been in her usual state of health until 06/16/2017 when she began developing nausea, vomiting, diarrhea.  She denied any fever, chills, chest pain, shortness breath, hematochezia, dysuria, hematuria.  She complained of some melena.  The patient went to see her primary care provider on 06/17/2017.  The patient was prescribed Zofran and Lomotil.  The patient had temporary relief for approximately 24 hours before starting to have vomiting and diarrhea once again. The patient denies any recent travels or sick contacts.  She states that she eats out on a daily basis as that she does not cook at home.  However, she denies any recent unusual, rale, or undercooked foods.  Her significant other has not had a similar GI illness. Notably, the patient had a similar presentation in August 2017.  The patient underwent an extensive GI work-up including colonoscopy on 09/19/2015 which showed hyperplastic polyps and a normal terminal ileum.  Capsule endoscopy on 07/30/2016 was also unremarkable.  The patient had negative tissue transglutaminase on 10/11/2015.  She went to get a second opinion at [facility] on 02/26/2017.  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 In the emergency department, the patient was afebrile hemodynamically stable saturating 98% on room air.  BMP, LFTs, and CBC were unremarkable.  Urinalysis was negative.  CT of the abdomen and pelvis showed small bowel loops dilated up to 3.5 cm with air-fluid levels with a transition point at the terminal ileum which is severely  narrowed.  Small bowel walls had increased enhancement.  There was gas and stool in the colon.  Assessment/Plan: Partial small bowel obstruction -Secondary to enteritis -Bowel rest -IV fluids -Around-the-clock/scheduled antiemetics -npo for now  Intractable nausea, vomiting, diarrhea -Stool for C. Difficile -Stool pathogen panel -Scheduled antiemetics -IV fluids -GI consult--patient has seen Dr. Oneida Alar in the past -Urine drug screen  Tobacco abuse I have discussed tobacco cessation with the patient.  I have counseled the patient regarding the negative impacts of continued tobacco use including but not limited to lung cancer, COPD, and cardiovascular disease.  I have discussed alternatives to tobacco and modalities that may help facilitate tobacco cessation including but not limited to biofeedback, hypnosis, and medications.  Total time spent with tobacco counseling was 4 minutes.  Anxiety/depression -Continue home dose of Xanax and Wellbutrin     Past Medical History:  Diagnosis Date  . Anxiety   . Asthma    ONLY WITH BRONCHITIS  . GAD (generalized anxiety disorder)   . Pneumonia   . PONV (postoperative nausea and vomiting)    Past Surgical History:  Procedure Laterality Date  . AGILE CAPSULE N/A 07/09/2016   Procedure: AGILE CAPSULE;  Surgeon: Danie Binder, MD;  [facility];  Service: Endoscopy;  Laterality: N/A;  7:30am, pt to arrive at 7:00am  . BIOPSY  09/19/2015   Procedure: BIOPSY;  Surgeon: Danie Binder, MD;  [facility];  Service: Endoscopy;;  random colon bx's  . COLONOSCOPY WITH PROPOFOL N/A 09/19/2015   Dr. Oneida Alar: five 2-3 mm polyps in rectum (hyperplastic) and descending  colon, diverticulosis in sigmoid colon and transverse colon. Query post-infectious IBS. Next colonoscopy in 10 years   . GIVENS CAPSULE STUDY N/A 07/30/2016   Procedure: GIVENS CAPSULE STUDY;  Surgeon: Danie Binder, MD;  [facility];  Service: Endoscopy;   Laterality: N/A;  . HYSTEROSCOPY  2006  . POLYPECTOMY  09/19/2015   Procedure: POLYPECTOMY;  Surgeon: Danie Binder, MD;  [facility];  Service: Endoscopy;;  descending colon polyps x3 cold bx, rectal polyp x2  . SALPINGOOPHORECTOMY Bilateral 08/18/2012   Procedure: SALPINGO OOPHORECTOMY;  Surgeon: Anastasio Auerbach, MD;  Location: [facility];  Service: Gynecology;  Laterality: Bilateral;  . VAGINAL HYSTERECTOMY N/A 08/18/2012   Procedure: HYSTERECTOMY VAGINAL;  Surgeon: Anastasio Auerbach, MD;  Location: [facility];  Service: Gynecology;  Laterality: N/A;   Social History:  reports that she has been smoking cigarettes.  She has a 12.50 pack-year smoking history. She has never used smokeless tobacco. She reports that she drinks alcohol. She reports that she does not use drugs.   Family History  Problem Relation Age of Onset  . Heart failure Father   . Stroke Father   . Breast cancer Maternal Aunt        50's  . Diabetes Paternal Grandfather   . Hypertension Maternal Grandmother   . Colon cancer Maternal Aunt   . Heart attack Mother      No Known Allergies   Prior to Admission medications   Medication Sig Start Date End Date Taking? Authorizing Provider  albuterol (PROVENTIL HFA;VENTOLIN HFA) 108 (90 Base) MCG/ACT inhaler Inhale 2 puffs into the lungs every 4 (four) hours as needed for wheezing. 07/04/16   Kathyrn Drown, MD  ALPRAZolam Duanne Moron) 0.5 MG tablet TAKE 1 TABLET BY MOUTH TWICE DAILY AS NEEDED FOR ANXIETY 02/06/17   Luking, Elayne Snare, MD  buPROPion (WELLBUTRIN XL) 150 MG 24 hr tablet Take 1 tablet (150 mg total) by mouth daily. 06/26/16   Kathyrn Drown, MD  chlorzoxazone (PARAFON) 500 MG tablet Take 1 tablet (500 mg total) by mouth 3 (three) times daily as needed for muscle spasms. 12/25/16   Mikey Kirschner, MD  diclofenac (VOLTAREN) 75 MG EC tablet One tablet twice a day as needed for pain. Take with food 12/27/16   Mikey Kirschner, MD  diclofenac (VOLTAREN) 75 MG EC  tablet Take 1 tablet (75 mg total) by mouth 2 (two) times daily. 02/12/17   Mikey Kirschner, MD  dicyclomine (BENTYL) 10 MG capsule Take 1 capsule (10 mg total) by mouth 3 (three) times daily before meals. 06/26/16   Kathyrn Drown, MD  diphenoxylate-atropine (LOMOTIL) 2.5-0.025 MG tablet One po Q 6 hours prn diarrhea 06/17/17   Mikey Kirschner, MD  omeprazole (PRILOSEC) 20 MG capsule 1 PO WITH BREAKFAST. 02/19/17   Carlis Stable, NP  ondansetron (ZOFRAN ODT) 4 MG disintegrating tablet One po Q 6 hours prn nausea. 06/17/17   Mikey Kirschner, MD    Review of Systems:  Constitutional:  No weight loss, night sweats, Fevers, chills, fatigue.  Head&Eyes: No headache.  No vision loss.  No eye pain or scotoma ENT:  No Difficulty swallowing,Tooth/dental problems,Sore throat,  No ear ache, post nasal drip,  Cardio-vascular:  No chest pain, Orthopnea, PND, swelling in lower extremities,  dizziness, palpitations  GI:  loss of appetite, hematochezia, melena, heartburn, indigestion, Resp:  No shortness of breath with exertion or at rest. No cough. No coughing up of blood .  No wheezing.No chest wall deformity  Skin:  no rash or lesions.  GU:  no dysuria, change in color of urine, no urgency or frequency. No flank pain.  Musculoskeletal:  No joint pain or swelling. No decreased range of motion. No back pain.  Psych:  No change in mood or affect. No depression or anxiety. Neurologic: No headache, no dysesthesia, no focal weakness, no vision loss. No syncope  Physical Exam: Vitals:   06/19/17 1016  BP: 99/74  Pulse: (!) 101  Resp: (!) 22  Temp: 98.4 F (36.9 C)  TempSrc: Oral  SpO2: 98%  Weight: 70.8 kg (156 lb)  Height: 5\' 6"  (1.676 m)   General:  A&O x 3, NAD, nontoxic, pleasant/cooperativeint Head/Eye: No conjunctival hemorrhage, no icterus, [place]/AT, No nystagmus ENT:  No icterus,  No thrush, good dentition, no pharyngeal exudate Neck:  No masses, no lymphadenpathy, no bruits CV:   RRR, no rub, no gallop, no S3 Lung:  CTAB, good air movement, no wheeze, no rhonchi Abdomen: soft/LLQ and RLQ pain, +BS, nondistended, no peritoneal signs Ext: No cyanosis, No rashes, No petechiae, No lymphangitis, No edema Neuro: CNII-XII intact, strength 4/5 in bilateral upper and lower extremities, no dysmetria  Labs on Admission:  Basic Metabolic Panel: Recent Labs  Lab 06/19/17 1035  NA 138  K 3.8  CL 101  CO2 25  GLUCOSE 118*  BUN 14  CREATININE 1.00  CALCIUM 9.2   Liver Function Tests: Recent Labs  Lab 06/19/17 1035  AST 17  ALT 23  ALKPHOS 63  BILITOT 1.0  PROT 8.1  ALBUMIN 4.5   Recent Labs  Lab 06/19/17 1035  LIPASE 32   No results for input(s): AMMONIA in the last 168 hours. CBC: Recent Labs  Lab 06/19/17 1035  WBC 4.4  HGB 17.2*  HCT 52.5*  MCV 91.8  PLT 261   Coagulation Profile: No results for input(s): INR, PROTIME in the last 168 hours. Cardiac Enzymes: No results for input(s): CKTOTAL, CKMB, CKMBINDEX, TROPONINI in the last 168 hours. BNP: Invalid input(s): POCBNP CBG: No results for input(s): GLUCAP in the last 168 hours. Urine analysis:    Component Value Date/Time   COLORURINE YELLOW 06/19/2017 1019   APPEARANCEUR HAZY (A) 06/19/2017 1019   LABSPEC 1.030 06/19/2017 1019   PHURINE 6.0 06/19/2017 1019   GLUCOSEU NEGATIVE 06/19/2017 1019   HGBUR SMALL (A) 06/19/2017 1019   BILIRUBINUR NEGATIVE 06/19/2017 1019   BILIRUBINUR + 07/01/2012 1002   KETONESUR 20 (A) 06/19/2017 1019   PROTEINUR NEGATIVE 06/19/2017 1019   UROBILINOGEN 0.2 10/21/2013 1609   NITRITE NEGATIVE 06/19/2017 1019   LEUKOCYTESUR NEGATIVE 06/19/2017 1019   Sepsis Labs: @LABRCNTIP (procalcitonin:4,lacticidven:4) )No results found for this or any previous visit (from the past 240 hour(s)).   Radiological Exams on Admission: Ct Abdomen Pelvis W Contrast  Result Date: 06/19/2017 CLINICAL DATA:  Nausea, vomiting, abdominal pain and incontinence for 2 days. EXAM:  CT ABDOMEN AND PELVIS WITH CONTRAST TECHNIQUE: Multidetector CT imaging of the abdomen and pelvis was performed using the standard protocol following bolus administration of intravenous contrast. CONTRAST:  100 ml ISOVUE-300 IOPAMIDOL (ISOVUE-300) INJECTION 61% COMPARISON:  CT abdomen and pelvis 06/28/2017. FINDINGS: Lower chest: There is some dependent atelectasis in the lung bases, more notable on the left. No pleural or pericardial effusion. Hepatobiliary: No focal liver abnormality is seen. No gallstones, gallbladder wall thickening, or biliary dilatation. Pancreas: Unremarkable. No pancreatic ductal dilatation or surrounding inflammatory changes. Spleen: Normal in size without focal abnormality. Adrenals/Urinary Tract:  Adrenal glands are unremarkable. Kidneys are normal, without renal calculi, focal lesion, or hydronephrosis. Bladder is unremarkable. Stomach/Bowel: Small bowel loops are dilated at 3.5 cm with air-fluid levels present. Transition point is seen in the terminal ileum which is markedly narrowed. Walls of small bowel demonstrate atypically increased enhancement. The appendix appears normal. The stomach is mildly dilated but otherwise unremarkable. There is some gas and stool in the colon. The colon is otherwise unremarkable. Vascular/Lymphatic: No significant vascular findings are present. No enlarged abdominal or pelvic lymph nodes. Reproductive: Status post hysterectomy. No adnexal masses. Other: There is a small volume of free pelvic fluid. Musculoskeletal: Negative. IMPRESSION: The exam is positive for partial or early small bowel obstruction. Transition point is at the terminal ileum which is markedly narrowed. Findings are worrisome for infectious or inflammatory bowel disease including Crohn's disease. Small volume of free pelvic fluid is likely due to inflammatory change. Electronically Signed   By: Inge Rise M.D.   On: 06/19/2017 15:15        Time spent:60 minutes Code  Status:   FULL Family Communication:  significant other upated at bedside Disposition Plan: expect 2-3 day hospitalization Consults called: none DVT Prophylaxis: [place] Lovenox  Orson Eva, DO  [facility] Pager [phone]  If 7PM-7AM, please contact night-coverage [email or web address] Password [facility] 06/19/2017, 4:35 PM

## 2017-06-19 NOTE — ED Notes (Signed)
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 Registration notified family that she would be brought back to a room as quickly as possible. [place] notified and will speak with family.

## 2017-06-19 NOTE — ED Provider Notes (Signed)
[facility] EMERGENCY DEPARTMENT Provider Note   CSN: [id number] Arrival date & time: 06/19/17  6283     History   Chief Complaint Chief Complaint  Patient presents with  . Emesis    HPI Ruth Robertson is a 52 y.o. female.  HPI   Ruth Robertson is a 52 y.o. female who presents to the Emergency Department complaining of nausea, vomiting, and diarrhea.  Symptoms have been present for 4 days.  She describes a generalized pain to her abdomen with vomiting and few episodes of loose stools.  She states she was seen by her PCP 3 days ago and prescribed Zofran and Lomotil which have not helped her symptoms.  She states that she has a history of IBS and that her symptoms feel similar to previous.  She states that she was admitted for similar symptoms over one year ago.  She denies bloody diarrhea, fever, chills, dysuria, chest pain or shortness of breath.   Past Medical History:  Diagnosis Date  . Anxiety   . Asthma    ONLY WITH BRONCHITIS  . GAD (generalized anxiety disorder)   . Pneumonia   . PONV (postoperative nausea and vomiting)     Patient Active Problem List   Diagnosis Date Noted  . Depression with anxiety 03/26/2011    Past Surgical History:  Procedure Laterality Date  . AGILE CAPSULE N/A 07/09/2016   Procedure: AGILE CAPSULE;  Surgeon: Danie Binder, MD;  [facility];  Service: Endoscopy;  Laterality: N/A;  7:30am, pt to arrive at 7:00am  . BIOPSY  09/19/2015   Procedure: BIOPSY;  Surgeon: Danie Binder, MD;  [facility];  Service: Endoscopy;;  random colon bx's  . COLONOSCOPY WITH PROPOFOL N/A 09/19/2015   Dr. Oneida Alar: five 2-3 mm polyps in rectum (hyperplastic) and descending colon, diverticulosis in sigmoid colon and transverse colon. Query post-infectious IBS. Next colonoscopy in 10 years   . GIVENS CAPSULE STUDY N/A 07/30/2016   Procedure: GIVENS CAPSULE STUDY;  Surgeon: Danie Binder, MD;  [facility];   Service: Endoscopy;  Laterality: N/A;  . HYSTEROSCOPY  2006  . POLYPECTOMY  09/19/2015   Procedure: POLYPECTOMY;  Surgeon: Danie Binder, MD;  [facility];  Service: Endoscopy;;  descending colon polyps x3 cold bx, rectal polyp x2  . SALPINGOOPHORECTOMY Bilateral 08/18/2012   Procedure: SALPINGO OOPHORECTOMY;  Surgeon: Anastasio Auerbach, MD;  Location: [facility];  Service: Gynecology;  Laterality: Bilateral;  . VAGINAL HYSTERECTOMY N/A 08/18/2012   Procedure: HYSTERECTOMY VAGINAL;  Surgeon: Anastasio Auerbach, MD;  Location: [facility];  Service: Gynecology;  Laterality: N/A;     OB History    Gravida  1   Para  1   Term  1   Preterm      AB      Living  1     SAB      TAB      Ectopic      Multiple      Live Births               Home Medications    Prior to Admission medications   Medication Sig Start Date End Date Taking? Authorizing Provider  albuterol (PROVENTIL HFA;VENTOLIN HFA) 108 (90 Base) MCG/ACT inhaler Inhale 2 puffs into the lungs every 4 (four) hours as needed for wheezing. 07/04/16   Kathyrn Drown, MD  ALPRAZolam (XANAX) 0.5 MG tablet TAKE 1 TABLET BY MOUTH TWICE  DAILY AS NEEDED FOR ANXIETY 02/06/17   Kathyrn Drown, MD  buPROPion (WELLBUTRIN XL) 150 MG 24 hr tablet Take 1 tablet (150 mg total) by mouth daily. 06/26/16   Kathyrn Drown, MD  chlorzoxazone (PARAFON) 500 MG tablet Take 1 tablet (500 mg total) by mouth 3 (three) times daily as needed for muscle spasms. 12/25/16   Mikey Kirschner, MD  diclofenac (VOLTAREN) 75 MG EC tablet One tablet twice a day as needed for pain. Take with food 12/27/16   Mikey Kirschner, MD  diclofenac (VOLTAREN) 75 MG EC tablet Take 1 tablet (75 mg total) by mouth 2 (two) times daily. 02/12/17   Mikey Kirschner, MD  dicyclomine (BENTYL) 10 MG capsule Take 1 capsule (10 mg total) by mouth 3 (three) times daily before meals. 06/26/16   Kathyrn Drown, MD  diphenoxylate-atropine (LOMOTIL) 2.5-0.025 MG tablet One po  Q 6 hours prn diarrhea 06/17/17   Mikey Kirschner, MD  omeprazole (PRILOSEC) 20 MG capsule 1 PO WITH BREAKFAST. 02/19/17   Carlis Stable, NP  ondansetron (ZOFRAN ODT) 4 MG disintegrating tablet One po Q 6 hours prn nausea. 06/17/17   Mikey Kirschner, MD    Family History Family History  Problem Relation Age of Onset  . Heart failure Father   . Stroke Father   . Breast cancer Maternal Aunt        50's  . Diabetes Paternal Grandfather   . Hypertension Maternal Grandmother   . Colon cancer Maternal Aunt   . Heart attack Mother     Social History Social History   Tobacco Use  . Smoking status: Current Every Day Smoker    Packs/day: 0.50    Years: 25.00    Pack years: 12.50    Types: Cigarettes  . Smokeless tobacco: Never Used  Substance Use Topics  . Alcohol use: Yes    Alcohol/week: 0.0 oz    Comment: Occas  . Drug use: No     Allergies   Patient has no known allergies.   Review of Systems Review of Systems  Constitutional: Negative for appetite change, chills and fever.  Respiratory: Negative for chest tightness and shortness of breath.   Cardiovascular: Negative for chest pain.  Gastrointestinal: Positive for abdominal distention, abdominal pain, diarrhea, nausea and vomiting. Negative for blood in stool and constipation.  Genitourinary: Negative for decreased urine volume, difficulty urinating, dysuria and flank pain.  Musculoskeletal: Negative for back pain.  Skin: Negative for color change and rash.  Neurological: Negative for dizziness, weakness and numbness.  Hematological: Negative for adenopathy.  Psychiatric/Behavioral: Negative for confusion.  All other systems reviewed and are negative.    Physical Exam Updated Vital Signs BP 99/74 (BP Location: Right Arm)   Pulse (!) 101   Temp 98.4 F (36.9 C) (Oral)   Resp (!) 22   Ht 5\' 6"  (1.676 m)   Wt 70.8 kg (156 lb)   LMP 06/12/2012   SpO2 98%   BMI 25.18 kg/m   Physical Exam  Constitutional:  She is oriented to person, place, and time. She appears well-developed and well-nourished. No distress.  HENT:  Mucous membranes are dry  Eyes: Conjunctivae are normal.  Neck: Normal range of motion. Neck supple.  Cardiovascular: Normal rate, regular rhythm and normal heart sounds.  No murmur heard. Pulmonary/Chest: Effort normal and breath sounds normal. No respiratory distress.  Abdominal: Soft. She exhibits distension. She exhibits no mass. There is tenderness. There is no rebound  and no guarding.  Abdomen is mildly distended.  Diffuse ttp.  No guarding or rebound tenderness.  Musculoskeletal: Normal range of motion.  Lymphadenopathy:    She has no cervical adenopathy.  Neurological: She is alert and oriented to person, place, and time. No sensory deficit. Coordination normal.  Skin: Skin is warm.  Psychiatric: She has a normal mood and affect.  Nursing note and vitals reviewed.    ED Treatments / Results  Labs (all labs ordered are listed, but only abnormal results are displayed) Labs Reviewed  COMPREHENSIVE METABOLIC PANEL - Abnormal; Notable for the following components:      Result Value   Glucose, Bld 118 (*)    All other components within normal limits  CBC - Abnormal; Notable for the following components:   RBC 5.72 (*)    Hemoglobin 17.2 (*)    HCT 52.5 (*)    All other components within normal limits  URINALYSIS, ROUTINE W REFLEX MICROSCOPIC - Abnormal; Notable for the following components:   APPearance HAZY (*)    Hgb urine dipstick SMALL (*)    Ketones, ur 20 (*)    All other components within normal limits  LIPASE, BLOOD    EKG None  Radiology Ct Abdomen Pelvis W Contrast  Result Date: 06/19/2017 CLINICAL DATA:  Nausea, vomiting, abdominal pain and incontinence for 2 days. EXAM: CT ABDOMEN AND PELVIS WITH CONTRAST TECHNIQUE: Multidetector CT imaging of the abdomen and pelvis was performed using the standard protocol following bolus administration of  intravenous contrast. CONTRAST:  100 ml ISOVUE-300 IOPAMIDOL (ISOVUE-300) INJECTION 61% COMPARISON:  CT abdomen and pelvis 06/28/2017. FINDINGS: Lower chest: There is some dependent atelectasis in the lung bases, more notable on the left. No pleural or pericardial effusion. Hepatobiliary: No focal liver abnormality is seen. No gallstones, gallbladder wall thickening, or biliary dilatation. Pancreas: Unremarkable. No pancreatic ductal dilatation or surrounding inflammatory changes. Spleen: Normal in size without focal abnormality. Adrenals/Urinary Tract: Adrenal glands are unremarkable. Kidneys are normal, without renal calculi, focal lesion, or hydronephrosis. Bladder is unremarkable. Stomach/Bowel: Small bowel loops are dilated at 3.5 cm with air-fluid levels present. Transition point is seen in the terminal ileum which is markedly narrowed. Walls of small bowel demonstrate atypically increased enhancement. The appendix appears normal. The stomach is mildly dilated but otherwise unremarkable. There is some gas and stool in the colon. The colon is otherwise unremarkable. Vascular/Lymphatic: No significant vascular findings are present. No enlarged abdominal or pelvic lymph nodes. Reproductive: Status post hysterectomy. No adnexal masses. Other: There is a small volume of free pelvic fluid. Musculoskeletal: Negative. IMPRESSION: The exam is positive for partial or early small bowel obstruction. Transition point is at the terminal ileum which is markedly narrowed. Findings are worrisome for infectious or inflammatory bowel disease including Crohn's disease. Small volume of free pelvic fluid is likely due to inflammatory change. Electronically Signed   By: Inge Rise M.D.   On: 06/19/2017 15:15    Procedures Procedures (including critical care time)  Medications Ordered in ED Medications  ondansetron (ZOFRAN-ODT) disintegrating tablet 4 mg (4 mg Oral Given 06/19/17 1020)  sodium chloride 0.9 % bolus 1,000  mL (0 mLs Intravenous Stopped 06/19/17 1602)  metoCLOPramide (REGLAN) injection 10 mg (10 mg Intravenous Given 06/19/17 1420)  iopamidol (ISOVUE-300) 61 % injection 100 mL (100 mLs Intravenous Contrast Given 06/19/17 1448)     Initial Impression / Assessment and Plan / ED Course  I have reviewed the triage vital signs and the nursing notes.  Pertinent labs & imaging results that were available during my care of the patient were reviewed by me and considered in my medical decision making (see chart for details).     Review of previous medical records, it was noted the patient was admitted 09/2015 for refractory nausea vomiting diarrhea with abnormal CT findings that suggested enteritis versus inflammatory bowel disease.  Patient noted history of IBS although I was unable to find documentation of this.  Discussed CT findings and laboratory studies with the patient and she agrees to treatment plan with admission for possible early SBO.  Consulted hospitalist, Dr. Carles Collet who agrees see pt in ED and admit   Final Clinical Impressions(s) / ED Diagnoses   Final diagnoses:  Partial small bowel obstruction [facility])    ED Discharge Orders    None       Kem Parkinson, PA-C 06/19/17 1640    Julianne Rice, MD 06/22/17 [phone]

## 2017-06-20 LAB — BASIC METABOLIC PANEL
Anion gap: 8 (ref 5–15)
BUN: 11 mg/dL (ref 6–20)
CO2: 24 mmol/L (ref 22–32)
Calcium: 8.1 mg/dL — ABNORMAL LOW (ref 8.9–10.3)
Chloride: 106 mmol/L (ref 101–111)
Creatinine, Ser: 0.78 mg/dL (ref 0.44–1.00)
GFR calc Af Amer: >60 mL/min (ref >60)
GFR calc non Af Amer: >60 mL/min (ref >60)
Glucose, Bld: 103 mg/dL — ABNORMAL HIGH (ref 65–99)
Potassium: 4 mmol/L (ref 3.5–5.1)
Sodium: 138 mmol/L (ref 135–145)

## 2017-06-20 LAB — HIV ANTIBODY (ROUTINE TESTING W REFLEX): HIV Screen 4th Generation wRfx: NONREACTIVE

## 2017-06-20 LAB — CBC
HCT: 43.4 % (ref 36.0–46.0)
Hemoglobin: 13.9 g/dL (ref 12.0–15.0)
MCH: 30 pg (ref 26.0–34.0)
MCHC: 32 g/dL (ref 30.0–36.0)
MCV: 93.7 fL (ref 78.0–100.0)
Platelets: 227 10*3/uL (ref 150–400)
RBC: 4.63 MIL/uL (ref 3.87–5.11)
RDW: 13.5 % (ref 11.5–15.5)
WBC: 4.8 10*3/uL (ref 4.0–10.5)

## 2017-06-20 LAB — GASTROINTESTINAL PANEL BY PCR, STOOL (REPLACES STOOL CULTURE)

## 2017-06-20 LAB — MAGNESIUM: Magnesium: 1.7 mg/dL (ref 1.7–2.4)

## 2017-06-20 MED ORDER — ONDANSETRON 4 MG PO TBDP: 4.0000 mg | ORAL_TABLET | Freq: Four times a day (QID) | ORAL | 0 refills | Status: DC | PRN

## 2017-06-20 MED ORDER — ACETAMINOPHEN 325 MG PO TABS: 650.0000 mg | ORAL_TABLET | Freq: Four times a day (QID) | ORAL | Status: DC | PRN

## 2017-06-20 NOTE — Discharge Instructions (Signed)
-  follow up with PCP in 2 weeks.

## 2017-06-20 NOTE — Discharge Summary (Signed)
Physician Discharge Summary  Ruth Robertson JTT:[id number] DOB: November 13, 1965 DOA: 06/19/2017  PCP: Kathyrn Drown, MD  Admit date: 06/19/2017 Discharge date: 06/20/2017  Time spent: 45 minutes  Recommendations for Outpatient Follow-up:  -To be discharged home today. -Advised follow-up with PCP in 2 weeks.  Discharge Diagnoses:  Active Problems:   Partial small bowel obstruction (HCC)   Enteritis   Intractable vomiting   Tobacco abuse   Discharge Condition: Stable and improved  Filed Weights   06/19/17 1016 06/19/17 1842  Weight: 70.8 kg (156 lb) 72.1 kg (158 lb 15.2 oz)    History of present illness:  As per Dr. Carles Collet on 5/16: Ruth Robertson is a 52 y.o. female with medical history of tobacco abuse, irritable bowel syndrome, depression/anxiety presenting with 3-day history of nausea, vomiting, and diarrhea.  The patient had been in her usual state of health until 06/16/2017 when she began developing nausea, vomiting, diarrhea.  She denied any fever, chills, chest pain, shortness breath, hematochezia, dysuria, hematuria.  She complained of some melena.  The patient went to see her primary care provider on 06/17/2017.  The patient was prescribed Zofran and Lomotil.  The patient had temporary relief for approximately 24 hours before starting to have vomiting and diarrhea once again. The patient denies any recent travels or sick contacts.  She states that she eats out on a daily basis as that she does not cook at home.  However, she denies any recent unusual, rale, or undercooked foods.  Her significant other has not had a similar GI illness. Notably, the patient had a similar presentation in August 2017.  The patient underwent an extensive GI work-up including colonoscopy on 09/19/2015 which showed hyperplastic polyps and a normal terminal ileum.  Capsule endoscopy on 07/30/2016 was also unremarkable.  The patient had negative tissue transglutaminase on 10/11/2015.  She went  to get a second opinion at [facility] on 02/26/2017.  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 In the emergency department, the patient was afebrile hemodynamically stable saturating 98% on room air.  BMP, LFTs, and CBC were unremarkable.  Urinalysis was negative.  CT of the abdomen and pelvis showed small bowel loops dilated up to 3.5 cm with air-fluid levels with a transition point at the terminal ileum which is severely narrowed.  Small bowel walls had increased enhancement.  There was gas and stool in the colon.    Hospital Course:   Partial small bowel obstruction -Has resolved with conservative management, has had at least 4 bowel movements overnight, has tolerated solid food without nausea or vomiting. -We will discharge home with some Zofran to use as needed for nausea.  Rest of chronic medical conditions have been stable during this short hospitalization.  Procedures:  None  Consultations:  None  Discharge Instructions  Discharge Instructions    Diet - low sodium heart healthy   Complete by:  As directed    Increase activity slowly   Complete by:  As directed      Allergies as of 06/20/2017   No Known Allergies     Medication List    TAKE these medications   acetaminophen 325 MG tablet Commonly known as:  TYLENOL Take 2 tablets (650 mg total) by mouth every 6 (six) hours as needed for mild pain (or Fever >/= 101).   albuterol 108 (90 Base) MCG/ACT inhaler Commonly known as:  PROVENTIL HFA;VENTOLIN HFA Inhale 2 puffs into the lungs every  4 (four) hours as needed for wheezing.   ALPRAZolam 0.5 MG tablet Commonly known as:  XANAX TAKE 1 TABLET BY MOUTH TWICE DAILY AS NEEDED FOR ANXIETY   buPROPion 150 MG 24 hr tablet Commonly known as:  WELLBUTRIN XL Take 1 tablet (150 mg total) by mouth daily.   diphenoxylate-atropine 2.5-0.025 MG tablet Commonly known as:  LOMOTIL One po Q 6 hours prn diarrhea What changed:    how much to  take  how to take this  when to take this  reasons to take this  additional instructions   omeprazole 20 MG capsule Commonly known as:  PRILOSEC 1 PO WITH BREAKFAST. What changed:    how much to take  how to take this  when to take this  additional instructions   ondansetron 4 MG disintegrating tablet Commonly known as:  ZOFRAN ODT Take 1 tablet (4 mg total) by mouth every 6 (six) hours as needed for nausea. One po Q 6 hours prn nausea. What changed:    how much to take  how to take this  when to take this  reasons to take this      No Known Allergies Follow-up Information    Kathyrn Drown, MD. Schedule an appointment as soon as possible for a visit in 2 week(s).   Specialty:  Family Medicine Contact information: [address] [place] [address] [phone]            The results of significant diagnostics from this hospitalization (including imaging, microbiology, ancillary and laboratory) are listed below for reference.    Significant Diagnostic Studies: Ct Abdomen Pelvis W Contrast  Result Date: 06/19/2017 CLINICAL DATA:  Nausea, vomiting, abdominal pain and incontinence for 2 days. EXAM: CT ABDOMEN AND PELVIS WITH CONTRAST TECHNIQUE: Multidetector CT imaging of the abdomen and pelvis was performed using the standard protocol following bolus administration of intravenous contrast. CONTRAST:  100 ml ISOVUE-300 IOPAMIDOL (ISOVUE-300) INJECTION 61% COMPARISON:  CT abdomen and pelvis 06/28/2017. FINDINGS: Lower chest: There is some dependent atelectasis in the lung bases, more notable on the left. No pleural or pericardial effusion. Hepatobiliary: No focal liver abnormality is seen. No gallstones, gallbladder wall thickening, or biliary dilatation. Pancreas: Unremarkable. No pancreatic ductal dilatation or surrounding inflammatory changes. Spleen: Normal in size without focal abnormality. Adrenals/Urinary Tract: Adrenal glands are  unremarkable. Kidneys are normal, without renal calculi, focal lesion, or hydronephrosis. Bladder is unremarkable. Stomach/Bowel: Small bowel loops are dilated at 3.5 cm with air-fluid levels present. Transition point is seen in the terminal ileum which is markedly narrowed. Walls of small bowel demonstrate atypically increased enhancement. The appendix appears normal. The stomach is mildly dilated but otherwise unremarkable. There is some gas and stool in the colon. The colon is otherwise unremarkable. Vascular/Lymphatic: No significant vascular findings are present. No enlarged abdominal or pelvic lymph nodes. Reproductive: Status post hysterectomy. No adnexal masses. Other: There is a small volume of free pelvic fluid. Musculoskeletal: Negative. IMPRESSION: The exam is positive for partial or early small bowel obstruction. Transition point is at the terminal ileum which is markedly narrowed. Findings are worrisome for infectious or inflammatory bowel disease including Crohn's disease. Small volume of free pelvic fluid is likely due to inflammatory change. Electronically Signed   By: Inge Rise M.D.   On: 06/19/2017 15:15    Microbiology: Recent Results (from the past 240 hour(s))  C difficile quick scan w PCR reflex     Status: None   Collection Time: 06/19/17  6:30 PM  Result Value Ref Range Status   C Diff antigen NEGATIVE NEGATIVE Final   C Diff toxin NEGATIVE NEGATIVE Final   C Diff interpretation No C. difficile detected.  Final    Comment: Performed at [facility], [address] [place], [place] [address]     Labs: Basic Metabolic Panel: Recent Labs  Lab 06/19/17 1035 06/20/17 0629  NA 138 138  K 3.8 4.0  CL 101 106  CO2 25 24  GLUCOSE 118* 103*  BUN 14 11  CREATININE 1.00 0.78  CALCIUM 9.2 8.1*  MG  --  1.7   Liver Function Tests: Recent Labs  Lab 06/19/17 1035  AST 17  ALT 23  ALKPHOS 63  BILITOT 1.0  PROT 8.1  ALBUMIN 4.5   Recent Labs  Lab 06/19/17 1035   LIPASE 32   No results for input(s): AMMONIA in the last 168 hours. CBC: Recent Labs  Lab 06/19/17 1035 06/20/17 0629  WBC 4.4 4.8  HGB 17.2* 13.9  HCT 52.5* 43.4  MCV 91.8 93.7  PLT 261 227   Cardiac Enzymes: No results for input(s): CKTOTAL, CKMB, CKMBINDEX, TROPONINI in the last 168 hours. BNP: BNP (last 3 results) No results for input(s): BNP in the last 8760 hours.  ProBNP (last 3 results) No results for input(s): PROBNP in the last 8760 hours.  CBG: No results for input(s): GLUCAP in the last 168 hours.     Signed:  Lelon Frohlich  [facility] Pager: [phone] 06/20/2017, 11:31 AM

## 2017-06-20 NOTE — Progress Notes (Signed)
Patient reports 4 loose stools throughout night, only one seen by RN (type seven and light brown in color), patient reports no episodes of nausea or vomiting throughout but has been receiving zofran. Patient requesting drink and wants to know when her diet will be advanced.

## 2017-06-20 NOTE — Progress Notes (Signed)
Discharge instructions gone over with patient and boyfriend, verbalized understanding. IV removed, patient tolerated procedure well. Printed prescription and work note given to patient.

## 2017-06-23 ENCOUNTER — Telehealth: Payer: Self-pay | Admitting: *Deleted

## 2017-06-23 DIAGNOSIS — Z029 Encounter for administrative examinations, unspecified: Secondary | ICD-10-CM

## 2017-06-23 NOTE — Telephone Encounter (Signed)
Left message to return call 

## 2017-06-23 NOTE — Telephone Encounter (Signed)
Discharged from hospital on 5/17. For small bowel obstruction. ER note states to follow up with pcp in 2 weeks. Pt's mother walked in office today to see if pt could get appt for tomorrow. I called pt to get more info. She states she feels like she is getting better but still not able to eat much just nibbling on food. Drinking liquids, no fever, still has nausea. Taking zofran and it does help but not able to eat due to stomach feeling not right. No vomiting but having diarrhea. Stomach feels sore. She has a follow up scheduled may 31st. Can it be changed til tomorrow.

## 2017-06-23 NOTE — Telephone Encounter (Signed)
Yes tomorrow ok, if significantly worsens needs to return to e r

## 2017-06-23 NOTE — Telephone Encounter (Signed)
Discussed with pt. Pt verbalized understanding. Transferred to the front to schedule appt tomorrow and cancel appt on may 31st

## 2017-06-24 ENCOUNTER — Telehealth: Payer: Self-pay | Admitting: Family Medicine

## 2017-06-24 ENCOUNTER — Encounter: Payer: Self-pay | Admitting: Family Medicine

## 2017-06-24 ENCOUNTER — Ambulatory Visit (INDEPENDENT_AMBULATORY_CARE_PROVIDER_SITE_OTHER): Payer: 59 | Admitting: Family Medicine

## 2017-06-24 VITALS — BP 116/70 | Temp 98.3°F | Ht 66.0 in | Wt 153.0 lb

## 2017-06-24 DIAGNOSIS — K56600 Partial intestinal obstruction, unspecified as to cause: Secondary | ICD-10-CM | POA: Diagnosis not present

## 2017-06-24 NOTE — Progress Notes (Signed)
   Subjective:    Patient ID: Ruth Robertson, female    DOB: 1965-07-25, 52 y.o.   MRN: [id number]  HPIHospitalization follow up for small bowel obstruction. Pt states she is starting to feel better. Still weak. Drinking liquids, not eating much. She is keeping down soup and mashed potatoes. No diarrhea today. Diarrhea yesterday was like water coming out. No nausea today.  This patient has had recurrent spells of abdominal bloating and discomfort she also relates intermittent loose stools and watery stools she has been checked for celiac recently in the hospital with partial small bowel obstruction which showed a potential terminal ileum narrowing this needs to be looked into for the possibility of Crohn's   Review of Systems  Constitutional: Negative for activity change, appetite change and fatigue.  HENT: Negative for congestion.   Respiratory: Negative for cough.   Cardiovascular: Negative for chest pain.  Gastrointestinal: Negative for abdominal pain.  Endocrine: Negative for polydipsia and polyphagia.  Skin: Negative for color change.  Neurological: Negative for weakness.  Psychiatric/Behavioral: Negative for confusion.       Objective:   Physical Exam  Constitutional: She appears well-nourished. No distress.  Cardiovascular: Normal rate, regular rhythm and normal heart sounds.  No murmur heard. Pulmonary/Chest: Effort normal and breath sounds normal. No respiratory distress.  Abdominal: Soft. She exhibits no distension. There is no tenderness. There is no guarding.  Musculoskeletal: She exhibits no edema.  Lymphadenopathy:    She has no cervical adenopathy.  Neurological: She is alert. She exhibits normal muscle tone.  Psychiatric: Her behavior is normal.  Vitals reviewed.         Assessment & Plan:  Partial small bowel obstruction On recent CAT scan showed inflammation in the terminal ileum Has not had a biopsy of this area Previous colonoscopy  reviewed Celiac disease was tested previously ruled out Patient will need follow-up with [facility] gastro[facility]ology will need probable colonoscopy with terminal ileum biopsy to rule out the possibility of Crohn's disease

## 2017-06-24 NOTE — Telephone Encounter (Signed)
I need to speak with patient's GI specialist at [facility]- Dr Sharyne Richters regarding terminal ileum narrowing and the possibility of needing to have endoscopy/colonoscopy with biopsies-this patient has been seen at [facility] last visit was January of this year

## 2017-06-25 ENCOUNTER — Telehealth: Payer: Self-pay | Admitting: Family Medicine

## 2017-06-25 NOTE — Telephone Encounter (Signed)
(  [facility] Dr. Hipolito Bayley [phone])

## 2017-06-25 NOTE — Telephone Encounter (Signed)
I filled this and as best as possible please review if anything I have missed please let me know

## 2017-06-25 NOTE — Telephone Encounter (Signed)
Patient brought in FMLA when she came to her appointment. In your folder please review form, add any information that you think needs to go on form date,sign

## 2017-06-25 NOTE — Telephone Encounter (Signed)
I spoke with Abby the assistant with Dr.John Hipolito Bayley and she will send a message back to him to call you on Cell phone. Number given.

## 2017-06-27 ENCOUNTER — Telehealth: Payer: Self-pay | Admitting: Family Medicine

## 2017-06-27 NOTE — Telephone Encounter (Signed)
The specialist was spoken with they will be connecting with the patient to set up colonoscopy with biopsies-see other messages

## 2017-06-27 NOTE — Telephone Encounter (Signed)
Nurses-please let the patient know that I did speak with the specialist regarding her case.  Their office will be calling her to help set up the follow-up appointment at that time they will discuss colonoscopy with biopsies.  Also it is best for the patient to get CD ROM of her abdominal pelvic CAT scan which was recently done at the hospital with her last hospital stay.  She can get this through radiology.  She should bring that with her when she goes to see the specialist.

## 2017-06-27 NOTE — Telephone Encounter (Signed)
Please let the patient know that we did in fact page the doctor on the same day I saw the patient.  I have not had this doctor call me back as of this moment we will put another phone call into them.  We are certainly trying-as soon as a doctor calls back we will connect with her.  If she has not heard anything by Wednesday please have the patient call me again

## 2017-06-27 NOTE — Telephone Encounter (Signed)
Patient is aware we are trying to reach out to the Dr again and we will let her know once the two Dr's have spoken. She is aware if not heard back from [place] by Wednesday of next week she will call back. I called [facility] again spoke with Abby again she states she will send their Dr. Another note back asking him to call us on Dr.Scott's cell #.

## 2017-06-27 NOTE — Telephone Encounter (Signed)
Patient is aware 

## 2017-06-27 NOTE — Telephone Encounter (Signed)
Patient wants to know if Dr. Nicki Reaper has spoke with the doctor at [facility] yet regarding a biopsy.

## 2017-06-27 NOTE — Telephone Encounter (Signed)
Please advise 

## 2017-07-01 ENCOUNTER — Telehealth: Payer: Self-pay | Admitting: Family Medicine

## 2017-07-01 NOTE — Telephone Encounter (Signed)
Patient said that Ruth Robertson scheduled her appointment with them for August 20, 2017.  She wants to know if Dr. Nicki Reaper thinks this is too far out?

## 2017-07-01 NOTE — Telephone Encounter (Signed)
Discussed with patient. Patient advised per Dr Nicki Reaper -ideally it would be sooner but referral to centers where excellent care is given often are very busy and backlog because of their high demand for their services.  Dr Nicki Reaper would recommend that the patient call the doctor's office and let the receptionist she knows that she is willing to be on a cancellation list to be seen sooner if possible.  Also if the patient has a severe flare of her symptoms for the that could potentially require ER evaluation for hospital admission Dr Nicki Reaper would recommend that the patient go to [facility] ER for that problem.  Last week Dr Nicki Reaper did speak with the specialist regarding her case so they are aware of her she just needs to talk with no getting herself placed on a cancellation list. Patient verbalized understanding.

## 2017-07-01 NOTE — Telephone Encounter (Signed)
[employer] with [employer] faxed back FMLA wanting to know if we are going to covering patient 5/16-5/20 when she was in the hospital if so please date and sign where flags are.From now own if patient is hospitalize and we check the box we have to cover them until seen by [place].I was also told these forms must be done at patient appointment when they are here in the room tried to explain physician dont have time to fill out form while patient is here.

## 2017-07-01 NOTE — Telephone Encounter (Signed)
Ideally it would be sooner but referral to centers where excellent care is given often are very busy and backlog because of their high demand for their services.  I would recommend that the patient call the doctor's office and let the receptionist she knows that she is willing to be on a cancellation list to be seen sooner if possible.  Also if the patient has a severe flare of her symptoms for the that could potentially require ER evaluation for hospital admission I would recommend that the patient go to [facility] ER for that problem.  Last week I did speak with the specialist regarding her case so they are aware of her she just needs to talk with no getting herself placed on a cancellation list

## 2017-07-02 NOTE — Telephone Encounter (Signed)
This was refilled out and signed

## 2017-07-04 ENCOUNTER — Ambulatory Visit: Payer: 59 | Admitting: Family Medicine

## 2017-07-07 ENCOUNTER — Other Ambulatory Visit: Payer: Self-pay | Admitting: *Deleted

## 2017-07-07 NOTE — Patient Outreach (Signed)
[facility]) Care Management  07/07/2017  Christana Angelica [facility] Aug 26, 1965 [id number]   Subjective:  Telephone call to patient's home  / mobile number, no answer, left HIPAA compliant voicemail message, and requested call back.     Objective: Per KPN (Knowledge Performance Now, point of care tool) and chart review, patient hospitalized 06/19/17 -06/20/17 for Partial small bowel obstruction.  Patient also has a history of IBS.       Assessment: Received [facility] Transition of care referral on 06/20/17.   Transition of care follow up pending patient contact.      Plan: RNCM will send unsuccessful outreach  letter, [facility] pamphlet, will call patient for 2nd telephone outreach attempt, transition of care follow up, and proceed with case closure, within 10 business days if no return call.        Jeriah Corkum H. Annia Friendly, BSN, [place] Management [facility] Telephonic CM Phone: [phone] Fax: [phone]

## 2017-07-08 ENCOUNTER — Other Ambulatory Visit: Payer: Self-pay | Admitting: *Deleted

## 2017-07-08 NOTE — Patient Outreach (Signed)
[facility]) Care Management  07/08/2017  Ruth Robertson [facility] 06-16-1965 [id number]   Subjective:  Telephone call to patient's home  / mobile number, no answer, left HIPAA compliant voicemail message, and requested call back.     Objective: Per KPN (Knowledge Performance Now, point of care tool) and chart review, patient hospitalized 06/19/17 -06/20/17 for Partial small bowel obstruction.  Patient also has a history of IBS.       Assessment: Received [facility] Transition of care referral on 06/20/17.   Transition of care follow up pending patient contact.      Plan: RNCM will send unsuccessful outreach  letter, [facility] pamphlet, will call patient for 3rd telephone outreach attempt, transition of care follow up, and proceed with case closure, within 10 business days if no return call.       Nohemy Koop H. Annia Friendly, BSN, [place] Management [facility] Telephonic CM Phone: [phone] Fax: [phone]

## 2017-07-09 ENCOUNTER — Other Ambulatory Visit: Payer: Self-pay | Admitting: *Deleted

## 2017-07-09 NOTE — Patient Outreach (Signed)
[place] [place] [facility]) Care Management  07/09/2017  Ruth Robertson [facility] 12-12-1965 [id number]   Subjective:Telephone call to patient's home / mobile number, spoke with patient, and HIPAA verified.  Discussed [facility] Care Management [facility] Transition of care follow up, patient voiced understanding, and is in agreement to follow up.    Patient states she is feeling better, this was a bad attack of her stomach issues, has been referred by primary MD to gastroenterologist at  [facility], and has a scheduled appointment for 08/20/17.   States she will schedule a follow up appointment with primary MD after she has appointment with the specialist.   Patient states she is able to manage self care and has assistance as needed with activities of daily living / home management.   Patient voices understanding of medical diagnosis and treatment plan. States she is accessing the following [facility] benefits: outpatient pharmacy, hospital indemnity (verbally given contact number for Teena Dunk [phone], will file claim if appropriate, verbally given contact number for [employer] [phone] to request itemized bill), and family medical leave act (FMLA) in place.   Patient states she does not have any education material, transition of care, care coordination, disease management, disease monitoring, transportation, community resource, or pharmacy needs at this time.  States she is very appreciative of the follow up and is in agreement to receive [place] Management information.    Objective:Per KPN (Knowledge Performance Now, point of care tool) and chart review,patient hospitalized 06/19/17 -06/20/17 forPartial small bowel obstruction. Patient also has a history of IBS.      Assessment: Received [facility] Transition of care referral on 06/20/17.Transition of care follow up completed, no care management needs, and will proceed with case closure.     Plan:RNCM will send patient successful  outreach letter, [facility] pamphlet, and magnet. RNCM will complete case closure due to follow up completed / no care management needs.        Derra Shartzer H. Annia Friendly, BSN, [place] Management [facility] Telephonic CM Phone: [phone] Fax: [phone]

## 2017-07-10 ENCOUNTER — Telehealth: Payer: Self-pay | Admitting: Family Medicine

## 2017-07-10 NOTE — Telephone Encounter (Signed)
Please see

## 2017-07-10 NOTE — Telephone Encounter (Signed)
This was completed again-thank you

## 2017-07-10 NOTE — Telephone Encounter (Signed)
FYI--FMLA from [employer] which is [facility], this is the third time we have to correct this form. I dont now what else to add please view date and sign highlighted areas.In your yellow folder.

## 2017-07-16 ENCOUNTER — Telehealth: Payer: Self-pay | Admitting: Family Medicine

## 2017-07-16 DIAGNOSIS — R109 Unspecified abdominal pain: Secondary | ICD-10-CM | POA: Diagnosis not present

## 2017-07-16 DIAGNOSIS — F419 Anxiety disorder, unspecified: Secondary | ICD-10-CM | POA: Diagnosis not present

## 2017-07-16 DIAGNOSIS — K769 Liver disease, unspecified: Secondary | ICD-10-CM | POA: Diagnosis not present

## 2017-07-16 DIAGNOSIS — K219 Gastro-esophageal reflux disease without esophagitis: Secondary | ICD-10-CM | POA: Diagnosis not present

## 2017-07-16 DIAGNOSIS — R1084 Generalized abdominal pain: Secondary | ICD-10-CM | POA: Diagnosis not present

## 2017-07-16 DIAGNOSIS — R197 Diarrhea, unspecified: Secondary | ICD-10-CM | POA: Diagnosis not present

## 2017-07-16 DIAGNOSIS — K2980 Duodenitis without bleeding: Secondary | ICD-10-CM | POA: Diagnosis not present

## 2017-07-16 DIAGNOSIS — F17210 Nicotine dependence, cigarettes, uncomplicated: Secondary | ICD-10-CM | POA: Diagnosis not present

## 2017-07-16 DIAGNOSIS — K648 Other hemorrhoids: Secondary | ICD-10-CM | POA: Diagnosis not present

## 2017-07-16 DIAGNOSIS — E860 Dehydration: Secondary | ICD-10-CM | POA: Diagnosis not present

## 2017-07-16 DIAGNOSIS — R933 Abnormal findings on diagnostic imaging of other parts of digestive tract: Secondary | ICD-10-CM | POA: Diagnosis not present

## 2017-07-16 DIAGNOSIS — K529 Noninfective gastroenteritis and colitis, unspecified: Secondary | ICD-10-CM | POA: Diagnosis not present

## 2017-07-16 DIAGNOSIS — K589 Irritable bowel syndrome without diarrhea: Secondary | ICD-10-CM | POA: Diagnosis not present

## 2017-07-16 DIAGNOSIS — K922 Gastrointestinal hemorrhage, unspecified: Secondary | ICD-10-CM | POA: Diagnosis not present

## 2017-07-16 DIAGNOSIS — K5000 Crohn's disease of small intestine without complications: Secondary | ICD-10-CM | POA: Diagnosis not present

## 2017-07-16 NOTE — Telephone Encounter (Signed)
I have redone this FMLA three times and they still keep sending it back I dont what else they want.We filled out highlighted section the intermittent part.I think she needs to get her specialist to fill out I dont know what else they want or to do.Paper are in your folder for review.

## 2017-07-16 NOTE — Telephone Encounter (Signed)
Please advise 

## 2017-07-17 ENCOUNTER — Encounter: Payer: Self-pay | Admitting: Family Medicine

## 2017-07-17 DIAGNOSIS — K769 Liver disease, unspecified: Secondary | ICD-10-CM | POA: Insufficient documentation

## 2017-07-17 NOTE — Telephone Encounter (Signed)
Is a letter was dictated regarding this-please send to [employer] along with the [facility] correction

## 2017-07-19 MED ORDER — GENERIC EXTERNAL MEDICATION
Status: DC
Start: ? — End: 2017-07-19

## 2017-07-19 MED ORDER — LIDOCAINE HCL 1 % IJ SOLN
0.50 | INTRAMUSCULAR | Status: DC
Start: ? — End: 2017-07-19

## 2017-07-19 MED ORDER — ACETAMINOPHEN 325 MG PO TABS
650.00 | ORAL_TABLET | ORAL | Status: DC
Start: ? — End: 2017-07-19

## 2017-07-19 MED ORDER — MORPHINE SULFATE (PF) 4 MG/ML IV SOLN
4.00 | INTRAVENOUS | Status: DC
Start: ? — End: 2017-07-19

## 2017-07-19 MED ORDER — PANTOPRAZOLE SODIUM 40 MG PO TBEC
40.00 | DELAYED_RELEASE_TABLET | ORAL | Status: DC
Start: 2017-07-20 — End: 2017-07-19

## 2017-07-19 MED ORDER — BUPROPION HCL ER (XL) 150 MG PO TB24
150.00 | ORAL_TABLET | ORAL | Status: DC
Start: 2017-07-20 — End: 2017-07-19

## 2017-07-23 ENCOUNTER — Encounter: Payer: Self-pay | Admitting: Family Medicine

## 2017-07-23 ENCOUNTER — Ambulatory Visit: Payer: 59 | Admitting: Family Medicine

## 2017-07-23 VITALS — BP 112/72 | Ht 66.0 in | Wt 157.8 lb

## 2017-07-23 DIAGNOSIS — R768 Other specified abnormal immunological findings in serum: Secondary | ICD-10-CM

## 2017-07-23 DIAGNOSIS — R109 Unspecified abdominal pain: Secondary | ICD-10-CM

## 2017-07-23 DIAGNOSIS — K769 Liver disease, unspecified: Secondary | ICD-10-CM

## 2017-07-23 DIAGNOSIS — R300 Dysuria: Secondary | ICD-10-CM | POA: Diagnosis not present

## 2017-07-23 LAB — POCT URINALYSIS DIPSTICK
Spec Grav, UA: 1.02 (ref 1.010–1.025)
pH, UA: 6 (ref 5.0–8.0)

## 2017-07-23 NOTE — Progress Notes (Addendum)
   Subjective:    Patient ID: Ruth Robertson, female    DOB: [birth year], 52 y.o.   MRN: [id number]  HPI  Patient arrives for a follow up on a recent hospitalization at [facility] for ongoing abdominal pain. This patient had a admission to [facility] was there for several days she was having severe abdominal pain and had inflammation in her terminal ileum she underwent multiple tests including CAT scans lab work is low as well as colonoscopy and biopsies lab work did show a positive ANA her biopsies were negative her CAT scan did not show any tumor but did show terminal ileum inflammation  Patient is still having ongoing abdominal pain but not having the nausea vomiting that she was at  She has an appointment with gastroenterologist at [facility] in July she denies high fever chills sweats denies chest tightness pressure pain shortness of breath denies wheezing difficulty breathing   Review of Systems  Constitutional: Negative for activity change and appetite change.  HENT: Negative for congestion, rhinorrhea and sinus pressure.   Respiratory: Negative for cough.   Cardiovascular: Negative for chest pain.  Gastrointestinal: Positive for abdominal pain, diarrhea and nausea. Negative for vomiting.  Genitourinary: Negative for dysuria and hematuria.  Musculoskeletal: Negative for arthralgias and back pain.  Skin: Negative for color change, pallor and rash.  Neurological: Negative for weakness.  Psychiatric/Behavioral: Negative for confusion.      Patient did have hematuria on microscopic exam at [facility] Objective:   Physical Exam  Constitutional: She appears well-nourished. No distress.  Cardiovascular: Normal rate, regular rhythm and normal heart sounds.  No murmur heard. Pulmonary/Chest: Effort normal and breath sounds normal. No respiratory distress.  Musculoskeletal: She exhibits no edema.  Lymphadenopathy:    She has no cervical adenopathy.  Neurological: She is alert. She exhibits  normal muscle tone.  Psychiatric: Her behavior is normal.  Vitals reviewed.  On today's exam urinalysis negative for RBCs under microscope  25 minutes was spent with the patient.  This statement verifies that 25 minutes was indeed spent with the patient. Greater than half the time was spent in discussion, counseling and answering questions  regarding the issues that the patient came in for today as reflected in the diagnosis (s) please refer to documentation for further details. Significant amount of time spent answering multiple different questions regarding her hospital stay abdominal pain and MRI     Assessment & Plan:  Ongoing abdominal pain but negative biopsies follow-up gastroenterology for further testing  Positive ANA possibility of underlying lupus?  Will discuss with gastroenterology if they want her to have appointment with rheumatology here or there  Also has liver lesion which will need MRI this will need EVOVIST contrast this is per radiology guidelines CAT scan showed a liver lesion 1.8 cm hyperenhancing on the CAT scan

## 2017-07-24 NOTE — Addendum Note (Signed)
Addended by: Karle Barr on: 07/24/2017 09:10 AM   Modules accepted: Orders

## 2017-07-24 NOTE — Addendum Note (Signed)
Addended by: Karle Barr on: 07/24/2017 09:35 AM   Modules accepted: Orders

## 2017-07-24 NOTE — Progress Notes (Signed)
Patient is aware we will be setting this up she would like an early am or late afternoon appt and does not want it on 08/01/2017. Note given to Jessie.

## 2017-07-29 ENCOUNTER — Telehealth: Payer: Self-pay | Admitting: Family Medicine

## 2017-07-29 NOTE — Progress Notes (Signed)
We will put this under the responsibility of patient to call her specialist please see phone message

## 2017-07-29 NOTE — Telephone Encounter (Signed)
Patient was advised that we tried calling her specialist-unfortunately her specialist is out of the office for the next couple weeks.  I would advise that this patient call her specialist office at [facility] in the past the receptionist to pass a message to the specialist regarding her positive ANA-following questions- her GI specialist feels she needs to see a rheumatologist?  If the patient finds out they want her to see a rheumatologist she should find out from them if they want it done at [facility] or here locally.  Finally if it is to be done here locally we can help set up this appointment. Patient verbalized understanding.

## 2017-07-29 NOTE — Telephone Encounter (Signed)
Nurses-please let the patient know that we tried calling her specialist-unfortunately her specialist is out of the office for the next couple weeks.  I would advise that this patient call her specialist office at [facility] in the past the receptionist to pass a message to the specialist regarding her positive ANA-following questions- her GI specialist feels she needs to see a rheumatologist?  If the patient finds out they want her to see a rheumatologist she should find out from them if they want it done at [facility] or here locally.  Finally if it is to be done here locally we can help set up this appointment

## 2017-07-30 ENCOUNTER — Ambulatory Visit
Admission: RE | Admit: 2017-07-30 | Discharge: 2017-07-30 | Disposition: A | Discharge status: Home / Self Care | Payer: 59 | Source: Ambulatory Visit | Attending: Family Medicine | Admitting: Family Medicine

## 2017-07-30 DIAGNOSIS — K769 Liver disease, unspecified: Secondary | ICD-10-CM

## 2017-07-30 DIAGNOSIS — K56609 Unspecified intestinal obstruction, unspecified as to partial versus complete obstruction: Secondary | ICD-10-CM | POA: Diagnosis not present

## 2017-07-30 MED ORDER — GADOXETATE DISODIUM 0.25 MMOL/ML IV SOLN
7.0000 mL | Freq: Once | INTRAVENOUS | Status: AC | PRN
  Administered 2017-07-30: 7 mL via INTRAVENOUS

## 2017-08-01 ENCOUNTER — Other Ambulatory Visit: Payer: Self-pay | Admitting: Family Medicine

## 2017-08-01 MED FILL — BUPROPION HCL XL 150 MG TAB: 150 | 90 days supply | Qty: 90 | Fill #0

## 2017-08-01 MED FILL — OMEPRAZOLE 20 MG CAP: 20 | 60 days supply | Qty: 60 | Fill #3

## 2017-08-08 IMAGING — CR DG CHEST 2V
2 series · 2 of 2 positions shown · non-contrast
Comparison: 11/17/2014

CLINICAL DATA: Followup pneumonia diagnosed 11/17/2014.
Asymptomatic presently.

EXAM:
CHEST  2 VIEW

[view not recorded (1 of 2)]
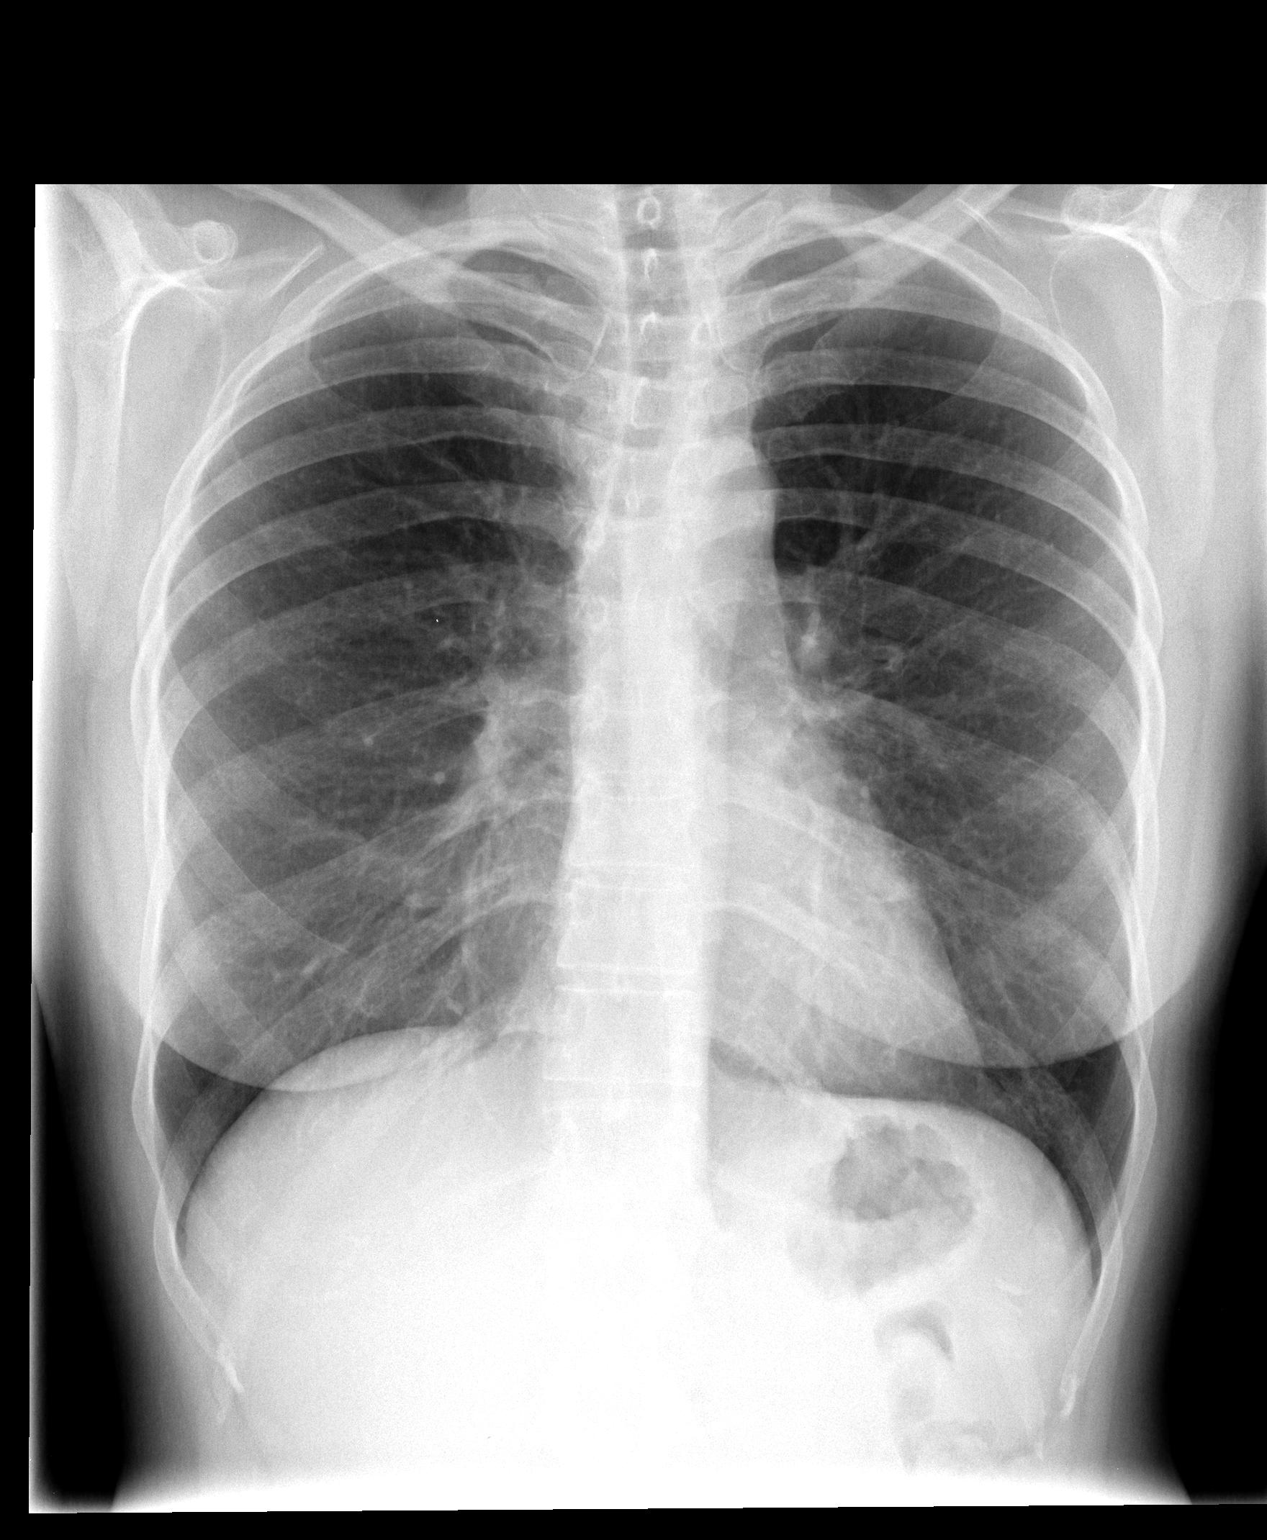

[view not recorded (2 of 2)]
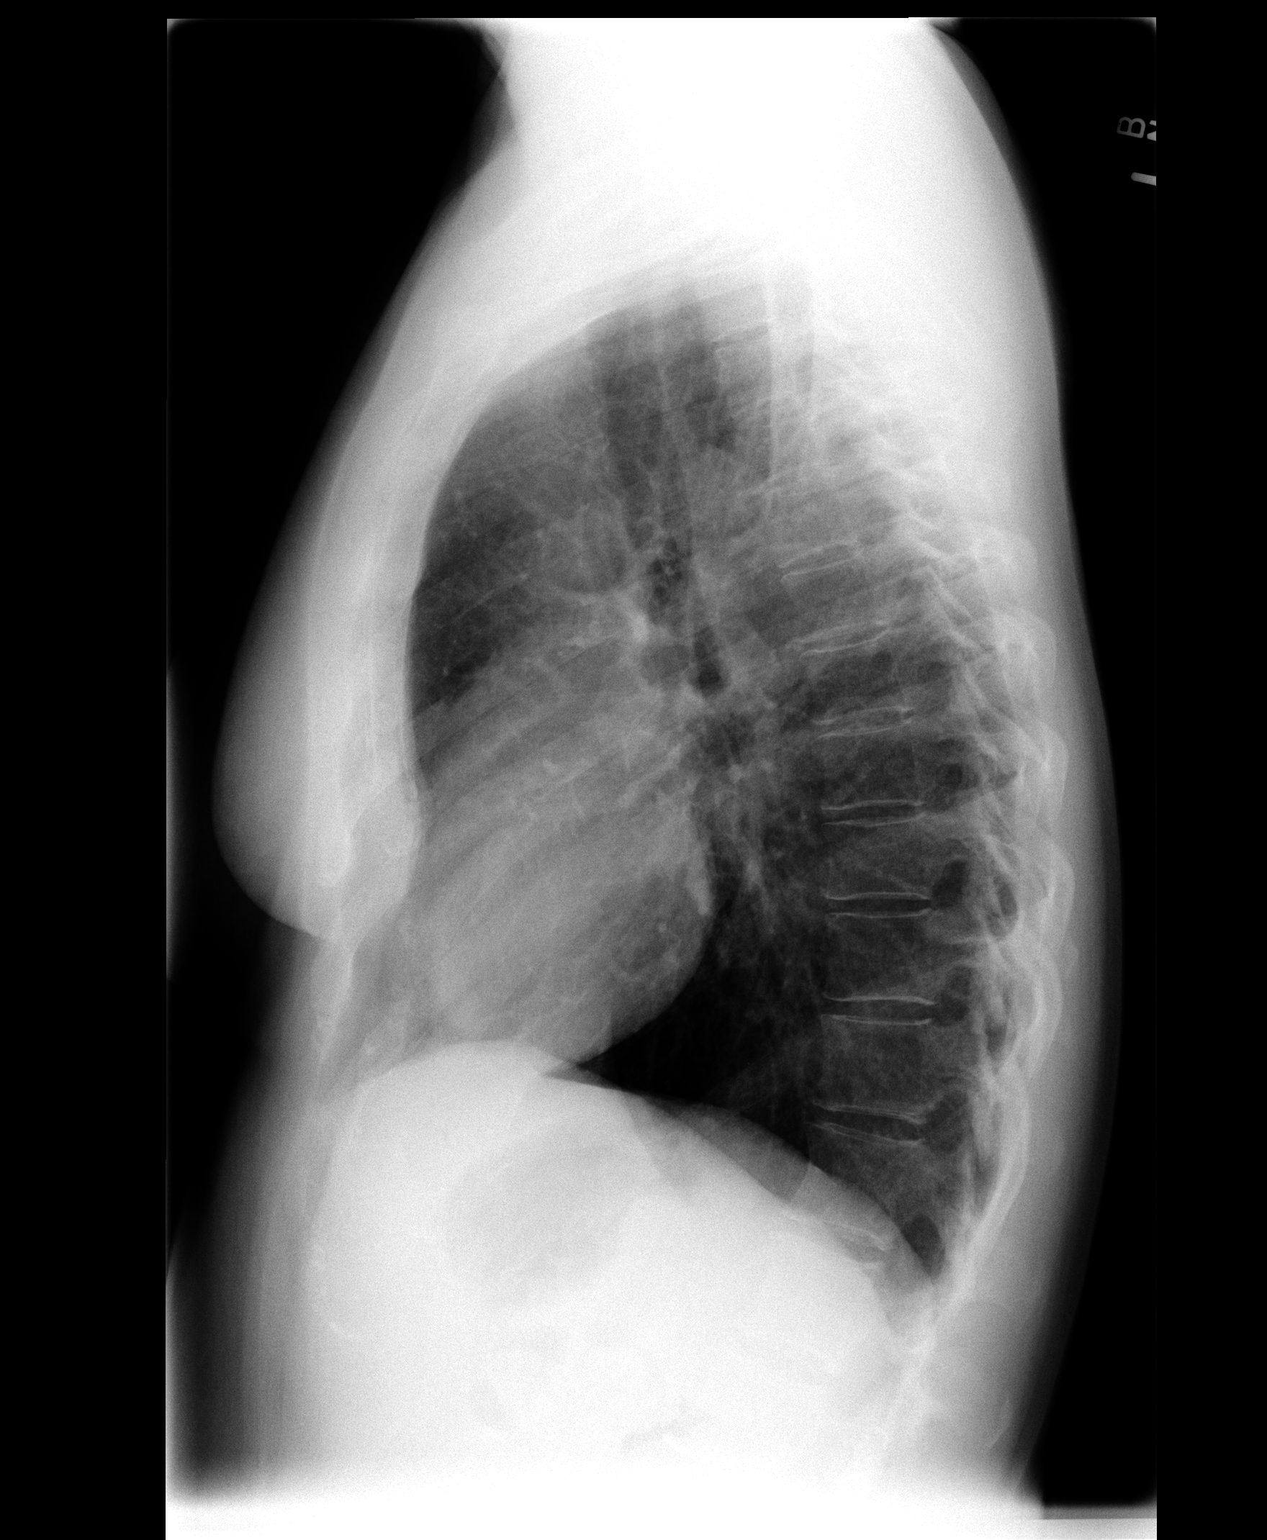

[2 of 2 positions shown; findings below may reference images not displayed]

FINDINGS: Heart size is normal. Mediastinal shadows are normal. Left lung
remains clear. There is near complete resolution of right middle
lobe infiltrate seen 1 month ago. Minimal persistent patchy density.
The remainder the right lung is clear. No effusions. Mild spinal
curvature again noted.
IMPRESSION: Near complete resolution of previously seen right middle lobe
infiltrate. Minimal residual patchy density persists.

## 2017-08-20 DIAGNOSIS — R112 Nausea with vomiting, unspecified: Secondary | ICD-10-CM | POA: Diagnosis not present

## 2017-08-20 DIAGNOSIS — R9389 Abnormal findings on diagnostic imaging of other specified body structures: Secondary | ICD-10-CM | POA: Diagnosis not present

## 2017-08-20 DIAGNOSIS — R197 Diarrhea, unspecified: Secondary | ICD-10-CM | POA: Diagnosis not present

## 2017-08-20 DIAGNOSIS — R140 Abdominal distension (gaseous): Secondary | ICD-10-CM | POA: Diagnosis not present

## 2017-08-28 DIAGNOSIS — R109 Unspecified abdominal pain: Secondary | ICD-10-CM | POA: Diagnosis not present

## 2017-08-28 DIAGNOSIS — R9389 Abnormal findings on diagnostic imaging of other specified body structures: Secondary | ICD-10-CM | POA: Diagnosis not present

## 2017-08-28 DIAGNOSIS — R899 Unspecified abnormal finding in specimens from other organs, systems and tissues: Secondary | ICD-10-CM | POA: Diagnosis not present

## 2017-09-16 ENCOUNTER — Other Ambulatory Visit: Payer: Self-pay | Admitting: Gastroenterology

## 2017-09-18 MED FILL — OMEPRAZOLE 20 MG CAP: 20 | 90 days supply | Qty: 90 | Fill #0

## 2017-09-23 DIAGNOSIS — R109 Unspecified abdominal pain: Secondary | ICD-10-CM | POA: Diagnosis not present

## 2017-09-23 DIAGNOSIS — R935 Abnormal findings on diagnostic imaging of other abdominal regions, including retroperitoneum: Secondary | ICD-10-CM | POA: Diagnosis not present

## 2017-09-24 DIAGNOSIS — R933 Abnormal findings on diagnostic imaging of other parts of digestive tract: Secondary | ICD-10-CM | POA: Diagnosis not present

## 2017-09-24 DIAGNOSIS — K529 Noninfective gastroenteritis and colitis, unspecified: Secondary | ICD-10-CM | POA: Diagnosis not present

## 2017-09-26 ENCOUNTER — Other Ambulatory Visit: Payer: Self-pay | Admitting: Family Medicine

## 2017-09-26 DIAGNOSIS — Z1231 Encounter for screening mammogram for malignant neoplasm of breast: Secondary | ICD-10-CM

## 2017-10-04 ENCOUNTER — Ambulatory Visit (HOSPITAL_COMMUNITY)
Admission: EM | Admit: 2017-10-04 | Discharge: 2017-10-04 | Disposition: A | Discharge status: Home / Self Care | Payer: 59 | Attending: Family Medicine | Admitting: Family Medicine

## 2017-10-04 ENCOUNTER — Encounter (HOSPITAL_COMMUNITY): Payer: Self-pay | Admitting: Family Medicine

## 2017-10-04 ENCOUNTER — Other Ambulatory Visit: Payer: Self-pay

## 2017-10-04 DIAGNOSIS — R3915 Urgency of urination: Secondary | ICD-10-CM | POA: Diagnosis not present

## 2017-10-04 LAB — POCT URINALYSIS DIP (DEVICE)
Bilirubin Urine: NEGATIVE
Glucose, UA: NEGATIVE mg/dL
Hgb urine dipstick: NEGATIVE
Ketones, ur: NEGATIVE mg/dL
Leukocytes, UA: NEGATIVE
Nitrite: NEGATIVE
Protein, ur: NEGATIVE mg/dL
Specific Gravity, Urine: <=1.005 (ref 1.005–1.030)
Urobilinogen, UA: 0.2 mg/dL (ref 0.0–1.0)
pH: 7 (ref 5.0–8.0)

## 2017-10-04 MED ORDER — TOLTERODINE TARTRATE ER 4 MG PO CP24: 4.0000 mg | ORAL_CAPSULE | Freq: Every day | ORAL | 0 refills | Status: DC

## 2017-10-04 NOTE — ED Triage Notes (Signed)
Have the urge to urinate a lot, lower left abd hurts, back hurting,

## 2017-10-04 NOTE — ED Provider Notes (Signed)
[place]    ASSESSMENT & PLAN:  1. Urinary urgency    U/A normal.  Trial of: Meds ordered this encounter  Medications  . tolterodine (DETROL LA) 4 MG 24 hr capsule    Sig: Take 1 capsule (4 mg total) by mouth daily.    Dispense:  10 capsule    Refill:  0   She will plan f/u with her PCP. May benefit from seeing urologist. Discussed.  Outlined signs and symptoms indicating need for more acute intervention. Patient verbalized understanding. After Visit Summary given.  SUBJECTIVE:  Ruth Robertson is a 52 y.o. female who complains of urinary urgency for the past several days but mild symptoms for awhile. No flank pain, fever, chills, abnormal vaginal discharge or bleeding. Hematuria: not present. Normal PO intake. No abdominal pain. No self treatment. Ambulatory without difficulty.  Also describes urinary incontinence with laughing or coughing hard. Present for years. No evaluation to date.  LMP: Patient's last menstrual period was 06/12/2012.  ROS: As in HPI.  OBJECTIVE:  Vitals:   10/04/17 1158  BP: 117/79  Pulse: 61  Temp: 98.1 F (36.7 C)  TempSrc: Oral  SpO2: 100%   Appears well, in no apparent distress. Abdomen is soft without tenderness, guarding, mass, rebound or organomegaly. No CVA tenderness or inguinal adenopathy noted.  Labs Reviewed  POCT URINALYSIS DIP (DEVICE)    No Known Allergies  Past Medical History:  Diagnosis Date  . Anxiety   . Asthma    ONLY WITH BRONCHITIS  . GAD (generalized anxiety disorder)   . Pneumonia   . PONV (postoperative nausea and vomiting)    Social History   Socioeconomic History  . Marital status: Divorced    Spouse name: Not on file  . Number of children: Not on file  . Years of education: Not on file  . Highest education level: Not on file  Occupational History  . Not on file  Social Needs  . Financial resource strain: Not on file  . Food insecurity:    Worry: Not on file   Inability: Not on file  . Transportation needs:    Medical: Not on file    Non-medical: Not on file  Tobacco Use  . Smoking status: Current Every Day Smoker    Packs/day: 0.50    Years: 25.00    Pack years: 12.50    Types: Cigarettes  . Smokeless tobacco: Never Used  Substance and Sexual Activity  . Alcohol use: Yes    Alcohol/week: 0.0 standard drinks    Comment: Occas  . Drug use: No  . Sexual activity: Yes    Partners: Male    Comment: HYST-1st intercourse 56 yo-5 partners  Lifestyle  . Physical activity:    Days per week: Not on file    Minutes per session: Not on file  . Stress: Not on file  Relationships  . Social connections:    Talks on phone: Not on file    Gets together: Not on file    Attends religious service: Not on file    Active member of club or organization: Not on file    Attends meetings of clubs or organizations: Not on file    Relationship status: Not on file  . Intimate partner violence:    Fear of current or ex partner: Not on file    Emotionally abused: Not on file    Physically abused: Not on file    Forced sexual activity: Not on file  Other Topics Concern  . Not on file  Social History Narrative  . Not on file   Family History  Problem Relation Age of Onset  . Heart failure Father   . Stroke Father   . Breast cancer Maternal Aunt        50's  . Diabetes Paternal Grandfather   . Hypertension Maternal Grandmother   . Colon cancer Maternal Aunt   . Heart attack Mother        Vanessa Kick, MD 10/09/17 [phone]

## 2017-10-21 ENCOUNTER — Ambulatory Visit: Payer: 59 | Admitting: Family Medicine

## 2017-10-21 ENCOUNTER — Encounter: Payer: Self-pay | Admitting: Family Medicine

## 2017-10-21 VITALS — BP 120/86 | Temp 98.0°F | Wt 162.0 lb

## 2017-10-21 DIAGNOSIS — J019 Acute sinusitis, unspecified: Secondary | ICD-10-CM

## 2017-10-21 MED ORDER — AMOXICILLIN-POT CLAVULANATE 875-125 MG PO TABS: 1.0000 | ORAL_TABLET | Freq: Two times a day (BID) | ORAL | 0 refills | Status: DC

## 2017-10-21 NOTE — Progress Notes (Signed)
   Subjective:    Patient ID: Ruth Robertson, female    DOB: October 02, 1965, 52 y.o.   MRN: [id number]  HPI Patient is here today with complaints of a cough,runny nose,sinus drainage and sore throat. Started having symptoms last evening.She has been taking Dayquil. Significant head congestion drainage coughing denies high fever chills sweats denies wheezing difficulty breathing energy level fair PMH benign  Review of Systems  Constitutional: Negative for activity change and fever.  HENT: Positive for congestion and rhinorrhea. Negative for ear pain.   Eyes: Negative for discharge.  Respiratory: Positive for cough. Negative for shortness of breath and wheezing.   Cardiovascular: Negative for chest pain.       Objective:   Physical Exam  Constitutional: She appears well-developed.  HENT:  Head: Normocephalic.  Nose: Nose normal.  Mouth/Throat: Oropharynx is clear and moist. No oropharyngeal exudate.  Neck: Neck supple.  Cardiovascular: Normal rate and normal heart sounds.  No murmur heard. Pulmonary/Chest: Effort normal and breath sounds normal. She has no wheezes.  Lymphadenopathy:    She has no cervical adenopathy.  Skin: Skin is warm and dry.  Nursing note and vitals reviewed.         Assessment & Plan:  Acute rhinosinusitis Antibiotics prescribed warning signs discussed Follow-up if ongoing troubles

## 2017-10-27 ENCOUNTER — Ambulatory Visit
Admission: RE | Admit: 2017-10-27 | Discharge: 2017-10-27 | Disposition: A | Discharge status: Home / Self Care | Payer: 59 | Source: Ambulatory Visit | Attending: Family Medicine | Admitting: Family Medicine

## 2017-10-27 ENCOUNTER — Other Ambulatory Visit: Payer: Self-pay | Admitting: Family Medicine

## 2017-10-27 DIAGNOSIS — Z1231 Encounter for screening mammogram for malignant neoplasm of breast: Secondary | ICD-10-CM

## 2017-11-17 DIAGNOSIS — R768 Other specified abnormal immunological findings in serum: Secondary | ICD-10-CM | POA: Diagnosis not present

## 2017-11-21 ENCOUNTER — Encounter: Payer: Self-pay | Admitting: Family Medicine

## 2017-11-24 ENCOUNTER — Ambulatory Visit: Payer: 59 | Admitting: Family Medicine

## 2017-11-24 ENCOUNTER — Encounter: Payer: Self-pay | Admitting: Family Medicine

## 2017-11-24 VITALS — Ht 66.0 in | Wt 162.6 lb

## 2017-11-24 DIAGNOSIS — R319 Hematuria, unspecified: Secondary | ICD-10-CM | POA: Diagnosis not present

## 2017-11-24 LAB — POCT URINALYSIS DIPSTICK
Spec Grav, UA: 1.015 (ref 1.010–1.025)
pH, UA: 6 (ref 5.0–8.0)

## 2017-11-24 NOTE — Progress Notes (Signed)
   Subjective:    Patient ID: Ruth Robertson, female    DOB: 02-14-1965, 52 y.o.   MRN: [id number]  HPI Patient stated she had some testing at [facility] and they called her and told her she had blood in her urine. Patient stated this is an ongoing problem and the doctors at [facility] have told her in the past she had blood in her urine.  Patient denies dysuria fevers chills sweats denies flank pain nighttime sweats.  Patient does have bowel condition under the microscope there is some   Objective:   Physical Exam On exam lungs clear no crackles respiratory rate normal heart regular no murmurs abdomen soft no masses felt flanks nontender       Assessment & Plan:  Under the microscope there is some red blood cells not frequent Recommend referral to urology because of her age smoking history plus also need to rule out bladder cancer very important to do ultrasound and cystoscope More than likely will be benign findings but is better to be on the safe side

## 2017-11-28 ENCOUNTER — Encounter: Payer: Self-pay | Admitting: Family Medicine

## 2017-12-01 ENCOUNTER — Encounter: Payer: Self-pay | Admitting: Gynecology

## 2017-12-01 ENCOUNTER — Ambulatory Visit (INDEPENDENT_AMBULATORY_CARE_PROVIDER_SITE_OTHER): Payer: 59 | Admitting: Gynecology

## 2017-12-01 VITALS — BP 122/76 | Ht 66.0 in | Wt 164.0 lb

## 2017-12-01 DIAGNOSIS — Z01419 Encounter for gynecological examination (general) (routine) without abnormal findings: Secondary | ICD-10-CM | POA: Diagnosis not present

## 2017-12-01 DIAGNOSIS — M545 Low back pain: Secondary | ICD-10-CM | POA: Diagnosis not present

## 2017-12-01 DIAGNOSIS — R3915 Urgency of urination: Secondary | ICD-10-CM | POA: Diagnosis not present

## 2017-12-01 DIAGNOSIS — R3121 Asymptomatic microscopic hematuria: Secondary | ICD-10-CM | POA: Diagnosis not present

## 2017-12-01 MED FILL — OXYBUTYNIN 5 MG TABLET: 5 | 30 days supply | Qty: 30 | Fill #0

## 2017-12-01 NOTE — Patient Instructions (Signed)
Follow-up in 1 year for annual exam, sooner if any issues. 

## 2017-12-01 NOTE — Progress Notes (Signed)
    Ruth Robertson 04-10-1965 [id number]        52 y.o.  G1P1001 for annual gynecologic exam.  Without gynecologic complaints.  Past medical history,surgical history, problem list, medications, allergies, family history and social history were all reviewed and documented as reviewed in the EPIC chart.  ROS:  Performed with pertinent positives and negatives included in the history, assessment and plan.   Additional significant findings : None   Exam: Ruth Robertson assistant Vitals:   12/01/17 1632  BP: 122/76  Weight: 164 lb (74.4 kg)  Height: 5\' 6"  (1.676 m)   Body mass index is 26.47 kg/m.  General appearance:  Normal affect, orientation and appearance. Skin: Grossly normal HEENT: Without gross lesions.  No cervical or supraclavicular adenopathy. Thyroid normal.  Lungs:  Clear without wheezing, rales or rhonchi Cardiac: RR, without RMG Abdominal:  Soft, nontender, without masses, guarding, rebound, organomegaly or hernia Breasts:  Examined lying and sitting without masses, retractions, discharge or axillary adenopathy. Pelvic:  Ext, BUS, Vagina: Normal  Adnexa: Without masses or tenderness    Anus and perineum: Normal   Rectovaginal: Normal sphincter tone without palpated masses or tenderness.    Assessment/Plan:  52 y.o. G53P1001 female for annual gynecologic exam.   1. Status post TVH BSO 2014.  Doing well without significant menopausal symptoms. 2. Pap smear 2018.  No Pap smear done today.  No history of significant abnormal Pap smears.  We discussed the options to stop screening per current screening guidelines based on hysterectomy history.  Will readdress on an annual basis. 3. Colonoscopy 2019.  Repeat at their recommended interval. 4. Mammography 10/2017.  Continue with annual mammography next year when due.  Breast exam normal today. 5. Bone health.  Reports having had bone density at her primary physician's office previously.  We will continue to follow-up  with them in reference to bone health. 6. Health maintenance.  Currently being evaluated by urology for persistent hematuria.  No routine blood work drawn today as patient is going to follow-up with her primary physician for ongoing blood work.  Follow-up in 1 year, sooner as needed.   Ruth Auerbach MD, 4:47 PM 12/01/2017

## 2017-12-03 ENCOUNTER — Encounter: Payer: Self-pay | Admitting: Family Medicine

## 2017-12-03 DIAGNOSIS — R3129 Other microscopic hematuria: Secondary | ICD-10-CM | POA: Insufficient documentation

## 2017-12-03 HISTORY — DX: Other microscopic hematuria: R31.29

## 2017-12-22 DIAGNOSIS — H2513 Age-related nuclear cataract, bilateral: Secondary | ICD-10-CM | POA: Diagnosis not present

## 2017-12-22 DIAGNOSIS — G245 Blepharospasm: Secondary | ICD-10-CM | POA: Diagnosis not present

## 2017-12-22 DIAGNOSIS — H524 Presbyopia: Secondary | ICD-10-CM | POA: Diagnosis not present

## 2017-12-22 DIAGNOSIS — H25013 Cortical age-related cataract, bilateral: Secondary | ICD-10-CM | POA: Diagnosis not present

## 2017-12-22 DIAGNOSIS — H5213 Myopia, bilateral: Secondary | ICD-10-CM | POA: Diagnosis not present

## 2017-12-22 DIAGNOSIS — H52203 Unspecified astigmatism, bilateral: Secondary | ICD-10-CM | POA: Diagnosis not present

## 2017-12-24 DIAGNOSIS — R933 Abnormal findings on diagnostic imaging of other parts of digestive tract: Secondary | ICD-10-CM | POA: Diagnosis not present

## 2018-01-15 DIAGNOSIS — R3915 Urgency of urination: Secondary | ICD-10-CM | POA: Diagnosis not present

## 2018-01-15 DIAGNOSIS — R351 Nocturia: Secondary | ICD-10-CM | POA: Diagnosis not present

## 2018-02-13 MED FILL — OMEPRAZOLE 20 MG CPDR: 20 | 90 days supply | Qty: 90 | Fill #1

## 2018-02-13 MED FILL — buPROPion HCL ER (XL) 150 M: 150 | 90 days supply | Qty: 90 | Fill #1

## 2018-04-24 ENCOUNTER — Ambulatory Visit (INDEPENDENT_AMBULATORY_CARE_PROVIDER_SITE_OTHER): Payer: 59 | Admitting: Family Medicine

## 2018-04-24 ENCOUNTER — Encounter: Payer: Self-pay | Admitting: Family Medicine

## 2018-04-24 ENCOUNTER — Other Ambulatory Visit: Payer: Self-pay

## 2018-04-24 DIAGNOSIS — J019 Acute sinusitis, unspecified: Secondary | ICD-10-CM

## 2018-04-24 MED ORDER — AMOXICILLIN-POT CLAVULANATE 875-125 MG PO TABS: 1.0000 | ORAL_TABLET | Freq: Two times a day (BID) | ORAL | 0 refills | Status: DC

## 2018-04-24 NOTE — Progress Notes (Signed)
   Subjective:    Patient ID: Ruth Robertson, female    DOB: 02-15-1965, 53 y.o.   MRN: [id number]  HPI cough, runny nose - sometimes clear and sometimes green, ear pain, itchy eyes. no sob , no fever. Symptoms started 2 weeks ago. Tried sudafed, allergy meds, mucinex, tylenol.  Patient with significant head congestion drainage coughing sinus pressure not feeling good symptoms over the past couple weeks some allergy symptoms as well no vomiting or diarrhea  Review of Systems  Constitutional: Negative for activity change and fever.  HENT: Positive for congestion and rhinorrhea. Negative for ear pain.   Eyes: Negative for discharge.  Respiratory: Positive for cough. Negative for shortness of breath and wheezing.   Cardiovascular: Negative for chest pain.       Objective:   Physical Exam Vitals signs and nursing note reviewed.  Constitutional:      Appearance: She is well-developed.  HENT:     Head: Normocephalic.     Nose: Nose normal.     Mouth/Throat:     Pharynx: No oropharyngeal exudate.  Neck:     Musculoskeletal: Neck supple.  Cardiovascular:     Rate and Rhythm: Normal rate.     Heart sounds: Normal heart sounds. No murmur.  Pulmonary:     Effort: Pulmonary effort is normal.     Breath sounds: Normal breath sounds. No wheezing.  Lymphadenopathy:     Cervical: No cervical adenopathy.  Skin:    General: Skin is warm and dry.           Assessment & Plan:  Allergic rhinitis Acute rhinosinusitis Antibiotics prescribed warning signs discussed Allergy medicine as directed If progressive troubles or worse to follow-up

## 2018-05-01 ENCOUNTER — Telehealth: Payer: Self-pay | Admitting: Family Medicine

## 2018-05-01 NOTE — Telephone Encounter (Signed)
So as this illness progresses we all are going to know people who have been around people who have been diagnosed with the illness Currently right now the CDC does not recommend quarantine anyone who was around someone with coronavirus but instead recommends that everybody practice social distance Ruth Robertson will need to decide for herself if she wants to grandchild around Based on current recommendations the son can still be in the house but everyone should continue to practice social distance

## 2018-05-01 NOTE — Telephone Encounter (Signed)
Patient states her son Ruth Robertson maybe have been exposed to the covid 20. She states he was in a room with a person with glove and mask on he did not touch the patient but was sitting up the sink for Hd. She wanted to know if this is something he or she need to worry about since he lives with her.He does not have any symptoms. I advised that he should just monitor for fever,shortness of breath,cough, and if he should develop any of these he would need to contact his pcp for further instruction. [name] states she gets her grandaughter Q weekend and now does not know if she should under these circumstances.( I am sending a message under the son's information also) [name].

## 2018-05-01 NOTE — Telephone Encounter (Signed)
Patient states husband is [employer] employee and has been possibly exposed to COVID-19 while at work from another personnel but test has not came back yet to know definite, would like to know what precautions she should take. Advise.

## 2018-05-01 NOTE — Telephone Encounter (Signed)
Patient was informed.

## 2018-06-04 ENCOUNTER — Ambulatory Visit (INDEPENDENT_AMBULATORY_CARE_PROVIDER_SITE_OTHER): Payer: 59 | Admitting: Family Medicine

## 2018-06-04 ENCOUNTER — Other Ambulatory Visit: Payer: Self-pay

## 2018-06-04 DIAGNOSIS — M79672 Pain in left foot: Secondary | ICD-10-CM

## 2018-06-04 NOTE — Progress Notes (Signed)
   Subjective:    Patient ID: Ruth Robertson, female    DOB: 08-22-1965, 53 y.o.   MRN: [id number]  HPI Patient was seen here at the office Patient arrives with pain in left ankle s/p fall this am. Patient states she twisted her ankle and fell this am. Patient states she is able to bear weight and walk but it is painful. Significant ankle pain and discomfort in the foot region happen after having an accident she stepped off of a cemented area slipped and fell pain on the lateral aspect of the foot no other particular troubles Virtual Visit via Video Note  I connected with DESMA WILKOWSKI on 06/04/18 at  3:30 PM EDT by a video enabled telemedicine application and verified that I am speaking with the correct person using two identifiers.  Location: Patient: Office Provider: office   I discussed the limitations of evaluation and management by telemedicine and the availability of in person appointments. The patient expressed understanding and agreed to proceed.  History of Present Illness:    Observations/Objective:   Assessment and Plan:   Follow Up Instructions:    I discussed the assessment and treatment plan with the patient. The patient was provided an opportunity to ask questions and all were answered. The patient agreed with the plan and demonstrated an understanding of the instructions.   The patient was advised to call back or seek an in-person evaluation if the symptoms worsen or if the condition fails to improve as anticipated.  I provided 15 minutes of non-face-to-face time during this encounter.      Review of Systems     Objective:   Physical Exam        Assessment & Plan:  Left foot pain The proximal portion of the fifth metatarsal has tenderness but no swelling no deformity The patient will give [place] feedback within a few days of how her foot is doing. She will do cold compresses frequently over the next few days X-rays could be done  at this point but I agree with the patient that they are not absolutely necessary right at the moment If she has continued pain I would recommend x-ray of the left foot She will give [place] an update by Monday

## 2018-06-09 ENCOUNTER — Telehealth: Payer: Self-pay | Admitting: Family Medicine

## 2018-06-09 NOTE — Telephone Encounter (Signed)
That sounds good thank you follow-up if any ongoing troubles

## 2018-06-09 NOTE — Telephone Encounter (Signed)
Patient advised per Dr Nicki Reaper: That sounds good thank you follow-up if any ongoing troubles. Patient verbalized understanding.

## 2018-06-09 NOTE — Telephone Encounter (Signed)
Patient calling to give [place] an update.  She said her foot is feeling better and she has just been taking tylenol.  She thinks it will be fine.

## 2018-06-18 MED FILL — OMEPRAZOLE 20 MG CPDR: 20 | 90 days supply | Qty: 90 | Fill #0

## 2018-08-27 ENCOUNTER — Other Ambulatory Visit: Payer: Self-pay

## 2018-08-27 ENCOUNTER — Encounter (HOSPITAL_COMMUNITY): Payer: Self-pay

## 2018-08-27 ENCOUNTER — Emergency Department (HOSPITAL_COMMUNITY)
Admission: EM | Admit: 2018-08-27 | Discharge: 2018-08-27 | Disposition: A | Discharge status: Home / Self Care | Payer: 59 | Attending: Emergency Medicine | Admitting: Emergency Medicine

## 2018-08-27 ENCOUNTER — Emergency Department (HOSPITAL_COMMUNITY): Payer: 59

## 2018-08-27 DIAGNOSIS — F17210 Nicotine dependence, cigarettes, uncomplicated: Secondary | ICD-10-CM | POA: Insufficient documentation

## 2018-08-27 DIAGNOSIS — J45909 Unspecified asthma, uncomplicated: Secondary | ICD-10-CM | POA: Insufficient documentation

## 2018-08-27 DIAGNOSIS — Z79899 Other long term (current) drug therapy: Secondary | ICD-10-CM | POA: Insufficient documentation

## 2018-08-27 DIAGNOSIS — S61211A Laceration without foreign body of left index finger without damage to nail, initial encounter: Secondary | ICD-10-CM | POA: Diagnosis not present

## 2018-08-27 DIAGNOSIS — Y939 Activity, unspecified: Secondary | ICD-10-CM | POA: Insufficient documentation

## 2018-08-27 DIAGNOSIS — S6992XA Unspecified injury of left wrist, hand and finger(s), initial encounter: Secondary | ICD-10-CM | POA: Diagnosis present

## 2018-08-27 DIAGNOSIS — Y999 Unspecified external cause status: Secondary | ICD-10-CM | POA: Insufficient documentation

## 2018-08-27 DIAGNOSIS — Z23 Encounter for immunization: Secondary | ICD-10-CM | POA: Insufficient documentation

## 2018-08-27 DIAGNOSIS — Y92008 Other place in unspecified non-institutional (private) residence as the place of occurrence of the external cause: Secondary | ICD-10-CM | POA: Diagnosis not present

## 2018-08-27 DIAGNOSIS — W269XXA Contact with unspecified sharp object(s), initial encounter: Secondary | ICD-10-CM | POA: Insufficient documentation

## 2018-08-27 MED ORDER — POVIDONE-IODINE 10 % EX SOLN
CUTANEOUS | Status: DC | PRN
  Filled 2018-08-27: qty 15

## 2018-08-27 MED ORDER — TETANUS-DIPHTH-ACELL PERTUSSIS 5-2.5-18.5 LF-MCG/0.5 IM SUSP
0.5000 mL | Freq: Once | INTRAMUSCULAR | Status: AC
  Administered 2018-08-27: 0.5 mL via INTRAMUSCULAR
  Filled 2018-08-27: qty 0.5

## 2018-08-27 MED ORDER — LIDOCAINE HCL (PF) 1 % IJ SOLN
INTRAMUSCULAR | Status: AC
  Administered 2018-08-27: 16:00:00
  Filled 2018-08-27: qty 5

## 2018-08-27 MED ORDER — LIDOCAINE HCL (PF) 2 % IJ SOLN: 5.0000 mL | Freq: Once | INTRAMUSCULAR | Status: DC

## 2018-08-27 MED ORDER — LIDOCAINE-EPINEPHRINE (PF) 2 %-1:200000 IJ SOLN
INTRAMUSCULAR | Status: AC
  Administered 2018-08-27: 20 mL
  Filled 2018-08-27: qty 20

## 2018-08-27 NOTE — Discharge Instructions (Addendum)
Keep the wound clean with mild soap and water.  Keep it bandaged and splinted for the first several days.  Sutures out in 8 days.  You may follow-up with your primary doctor for suture removal or return here for suture removal or if any signs of infection such as increasing pain, redness, swelling or drainage.  Aleve as directed if needed for pain.

## 2018-08-27 NOTE — ED Triage Notes (Signed)
Pt has laceration to left index finger. Reports falling off porch

## 2018-08-27 NOTE — ED Provider Notes (Signed)
[facility] EMERGENCY DEPARTMENT Provider Note   CSN: [id number] Arrival date & time: 08/27/18  1333     History   Chief Complaint Chief Complaint  Patient presents with  . Laceration    HPI Ruth Robertson is a 53 y.o. female.     HPI   Ruth Robertson is a 53 y.o. female who presents to the Emergency Department complaining of laceration to her left index finger.  She states that she fell on her front porch and believes she cut her finger on something as she fell.  She controlled the bleeding with direct pressure.  She reports mild throbbing pain, but denies numbness of the finger, excessive pain with movement, and difficulty flexing or extending the finger.  Last Td is unknown.  She denies other injuries.    Past Medical History:  Diagnosis Date  . Anxiety   . Asthma    ONLY WITH BRONCHITIS  . GAD (generalized anxiety disorder)   . Microscopic hematuria 12/03/2017   Negative cystoscopy negative CAT scan  . Pneumonia   . PONV (postoperative nausea and vomiting)     Patient Active Problem List   Diagnosis Date Noted  . Microscopic hematuria 12/03/2017  . Partial small bowel obstruction ([place]) 06/19/2017  . Enteritis 06/19/2017  . Intractable vomiting 06/19/2017  . Tobacco abuse 06/19/2017  . Depression with anxiety 03/26/2011    Past Surgical History:  Procedure Laterality Date  . AGILE CAPSULE N/A 07/09/2016   Procedure: AGILE CAPSULE;  Surgeon: Danie Binder, MD;  Location: [facility];  Service: Endoscopy;  Laterality: N/A;  7:30am, pt to arrive at 7:00am  . BIOPSY  09/19/2015   Procedure: BIOPSY;  Surgeon: Danie Binder, MD;  Location: [facility];  Service: Endoscopy;;  random colon bx's  . COLONOSCOPY WITH PROPOFOL N/A 09/19/2015   Dr. Oneida Alar: five 2-3 mm polyps in rectum (hyperplastic) and descending colon, diverticulosis in sigmoid colon and transverse colon. Query post-infectious IBS. Next colonoscopy in 10 years   . GIVENS CAPSULE  STUDY N/A 07/30/2016   Procedure: GIVENS CAPSULE STUDY;  Surgeon: Danie Binder, MD;  Location: [facility];  Service: Endoscopy;  Laterality: N/A;  . HYSTEROSCOPY  2006  . POLYPECTOMY  09/19/2015   Procedure: POLYPECTOMY;  Surgeon: Danie Binder, MD;  Location: [facility];  Service: Endoscopy;;  descending colon polyps x3 cold bx, rectal polyp x2  . SALPINGOOPHORECTOMY Bilateral 08/18/2012   Procedure: SALPINGO OOPHORECTOMY;  Surgeon: Anastasio Auerbach, MD;  Location: [facility];  Service: Gynecology;  Laterality: Bilateral;  . VAGINAL HYSTERECTOMY N/A 08/18/2012   Procedure: HYSTERECTOMY VAGINAL;  Surgeon: Anastasio Auerbach, MD;  Location: [facility];  Service: Gynecology;  Laterality: N/A;     OB History    Gravida  1   Para  1   Term  1   Preterm      AB      Living  1     SAB      TAB      Ectopic      Multiple      Live Births               Home Medications    Prior to Admission medications   Medication Sig Start Date End Date Taking? Authorizing Provider  acetaminophen (TYLENOL) 325 MG tablet Take 2 tablets (650 mg total) by mouth every 6 (six) hours as needed for mild pain (or Fever >/= 101). 06/20/17  Isaac Bliss, Rayford Halsted, MD  albuterol (PROVENTIL HFA;VENTOLIN HFA) 108 (90 Base) MCG/ACT inhaler Inhale 2 puffs into the lungs every 4 (four) hours as needed for wheezing. 07/04/16   Kathyrn Drown, MD  ALPRAZolam Duanne Moron) 0.5 MG tablet TAKE 1 TABLET BY MOUTH TWICE DAILY AS NEEDED FOR ANXIETY 02/06/17   Kathyrn Drown, MD  amoxicillin-clavulanate (AUGMENTIN) 875-125 MG tablet Take 1 tablet by mouth 2 (two) times daily. 04/24/18   Kathyrn Drown, MD  buPROPion (WELLBUTRIN XL) 150 MG 24 hr tablet TAKE 1 TABLET (150 MG TOTAL) BY MOUTH DAILY. 08/01/17   Nilda Simmer, NP  diphenoxylate-atropine (LOMOTIL) 2.5-0.025 MG tablet One po Q 6 hours prn diarrhea Patient not taking: Reported on 04/24/2018 06/17/17   Mikey Kirschner, MD  omeprazole (PRILOSEC) 20 MG  capsule TAKE 1 CAPSULE BY MOUTH ONCE DAILY WITH BREAKFAST 09/18/17   Carlis Stable, NP  ondansetron (ZOFRAN ODT) 4 MG disintegrating tablet Take 1 tablet (4 mg total) by mouth every 6 (six) hours as needed for nausea. One po Q 6 hours prn nausea. Patient not taking: Reported on 04/24/2018 06/20/17   Isaac Bliss, Rayford Halsted, MD  tolterodine (DETROL LA) 4 MG 24 hr capsule Take 1 capsule (4 mg total) by mouth daily. Patient not taking: Reported on 12/01/2017 10/04/17   Vanessa Kick, MD    Family History Family History  Problem Relation Age of Onset  . Heart failure Father   . Stroke Father   . Breast cancer Maternal Aunt        50's  . Diabetes Paternal Grandfather   . Hypertension Maternal Grandmother   . Colon cancer Maternal Aunt   . Heart attack Mother     Social History Social History   Tobacco Use  . Smoking status: Current Every Day Smoker    Packs/day: 0.50    Years: 25.00    Pack years: 12.50    Types: Cigarettes  . Smokeless tobacco: Never Used  Substance Use Topics  . Alcohol use: Not Currently    Alcohol/week: 0.0 standard drinks  . Drug use: No     Allergies   Patient has no known allergies.   Review of Systems Review of Systems  Constitutional: Negative for chills and fever.  Musculoskeletal: Negative for arthralgias, back pain and joint swelling.  Skin: Positive for wound.       Laceration left index finger  Neurological: Negative for dizziness, weakness and numbness.  Hematological: Does not bruise/bleed easily.     Physical Exam Updated Vital Signs BP (!) 143/98 (BP Location: Right Arm)   Pulse 84   Temp 98 F (36.7 C) (Oral)   Resp 15   Ht 5\' 6"  (1.676 m)   Wt 74.8 kg   LMP 06/12/2012   SpO2 99%   BMI 26.63 kg/m   Physical Exam Vitals signs and nursing note reviewed.  Constitutional:      General: She is not in acute distress.    Appearance: Normal appearance. She is well-developed.  HENT:     Head: Atraumatic.  Cardiovascular:      Rate and Rhythm: Normal rate and regular rhythm.     Pulses: Normal pulses.  Pulmonary:     Effort: Pulmonary effort is normal. No respiratory distress.     Breath sounds: Normal breath sounds.  Musculoskeletal:        General: Signs of injury present. No swelling or deformity.     Left hand: She exhibits tenderness and laceration.  She exhibits normal range of motion, no bony tenderness, normal two-point discrimination, normal capillary refill and no swelling. Normal sensation noted. Normal strength noted.  Skin:    General: Skin is warm.     Findings: Laceration present. No rash.  Neurological:     General: No focal deficit present.     Mental Status: She is alert.     Sensory: No sensory deficit.     Motor: No weakness or abnormal muscle tone.      ED Treatments / Results  Labs (all labs ordered are listed, but only abnormal results are displayed) Labs Reviewed - No data to display  EKG None  Radiology Dg Finger Index Left  Result Date: 08/27/2018 CLINICAL DATA:  Laceration of the left index finger secondary to falling off of a porch. EXAM: LEFT INDEX FINGER 2+V COMPARISON:  None. FINDINGS: There is no evidence of fracture or dislocation. There is no evidence of significant arthropathy or other focal bone abnormality. Soft tissues are unremarkable. IMPRESSION: Negative. Electronically Signed   By: Lorriane Shire M.D.   On: 08/27/2018 15:12    Procedures Procedures (including critical care time)  LACERATION REPAIR Performed by: Anntoinette Haefele Authorized by: Jaeliana Lococo Consent: Verbal consent obtained. Risks and benefits: risks, benefits and alternatives were discussed Consent given by: patient Patient identity confirmed: provided demographic data Prepped and Draped in normal sterile fashion Wound explored  Laceration Location: Left index finger  Laceration Length: 3.5 cm  No Foreign Bodies seen or palpated  Anesthesia: local infiltration  Local  anesthetic: lidocaine 1 % without epinephrine  Anesthetic total: 3 ml  Irrigation method: syringe Amount of cleaning: standard  Skin closure: 4-0 Ethilon  Number of sutures: 6  Technique: Simple interrupted  Patient tolerance: Patient tolerated the procedure well with no immediate complications.   Medications Ordered in ED Medications  lidocaine (XYLOCAINE) 2 % injection 5 mL (5 mLs Intradermal Not Given 08/27/18 1602)  povidone-iodine (BETADINE) 10 % external solution (has no administration in time range)  Tdap (BOOSTRIX) injection 0.5 mL (0.5 mLs Intramuscular Given 08/27/18 1456)  lidocaine-EPINEPHrine (XYLOCAINE W/EPI) 2 %-1:200000 (PF) injection (20 mLs  Given by Other 08/27/18 1457)  lidocaine (PF) (XYLOCAINE) 1 % injection (  Given by Other 08/27/18 1602)     Initial Impression / Assessment and Plan / ED Course  I have reviewed the triage vital signs and the nursing notes.  Pertinent labs & imaging results that were available during my care of the patient were reviewed by me and considered in my medical decision making (see chart for details).        Patient with laceration over the PIP joint of the left index finger.  Bleeding controlled.  No edema.  Wound thoroughly explored and no foreign body or tendon injury seen.  Neurovascularly intact.  Patient has full range of motion of the digit.  Wound cleaned and laceration approximated well.  TD updated, patient agrees to wound care instructions and sutures out in 8 days.  Finger bandaged and finger splint applied by nursing staff.  Final Clinical Impressions(s) / ED Diagnoses   Final diagnoses:  Laceration of left index finger without foreign body without damage to nail, initial encounter    ED Discharge Orders    None       Bufford Lope 08/29/18 1629    Milton Ferguson, MD 09/02/18 1924

## 2018-09-04 ENCOUNTER — Other Ambulatory Visit: Payer: Self-pay

## 2018-09-04 ENCOUNTER — Encounter: Payer: Self-pay | Admitting: Family Medicine

## 2018-09-04 ENCOUNTER — Ambulatory Visit (INDEPENDENT_AMBULATORY_CARE_PROVIDER_SITE_OTHER): Payer: 59 | Admitting: Family Medicine

## 2018-09-04 VITALS — BP 134/82 | Temp 97.7°F | Wt 168.0 lb

## 2018-09-04 DIAGNOSIS — L03115 Cellulitis of right lower limb: Secondary | ICD-10-CM

## 2018-09-04 DIAGNOSIS — Z4802 Encounter for removal of sutures: Secondary | ICD-10-CM | POA: Diagnosis not present

## 2018-09-04 MED ORDER — DOXYCYCLINE HYCLATE 100 MG PO TABS: ORAL_TABLET | ORAL | 0 refills | Status: DC

## 2018-09-04 NOTE — Progress Notes (Signed)
   Subjective:    Patient ID: Ruth Robertson, female    DOB: January 08, 1966, 53 y.o.   MRN: [id number]  HPI Pt here today to have stitches removed for left pointer finger. Pt states she fell off the porch about 8 days ago and sliced her finger to the tendon.  Patient had a significant laceration may have put in multiple stitches here today to have them removed Pt states she is having concerns with feet and hands at night. Feels like someone is crushing or squeezing them.   Pt also has boil in crease of legs.  She states this started up over the course of the past few days tender to the touch denies fever chills Review of Systems She relates soreness in her hand no other trouble other than what was mentioned above no COVID symptoms    Objective:   Physical Exam  Orlene Plum is clear respiratory rate normal heart regularg no murmur very small infection noted in the groin region at the crease of the leg inner thigh Patient also has a laceration of the finger.  Is on the extensor portion so therefore it can be stressed when she flexes the finger No sign of infection but it was a jagged cut so therefore that healing was not as nice looking as a simple straight cut sutures were removed without difficulty Steri-Strips placed afterwards      Assessment & Plan:  Developing cellulitis and boil I recommend warm compresses also recommend antibiotic doxycycline if it does start coming into an abscess to follow-up  Suture removal split Place Steri-Strips recommend keeping finger relatively straight over the course of the next 10 days then can start gradually bending the finger avoid any torquing on the laceration itself  15 minutes was spent with patient today discussing healthcare issues which they came.  More than 50% of this visit-total duration of visit-was spent in counseling and coordination of care.  Please see diagnosis regarding the focus of this coordination and care  As for the foot and hand  sensation I told her she would need to schedule a separate visit

## 2018-09-14 ENCOUNTER — Telehealth: Payer: Self-pay | Admitting: Family Medicine

## 2018-09-14 ENCOUNTER — Ambulatory Visit (INDEPENDENT_AMBULATORY_CARE_PROVIDER_SITE_OTHER): Payer: 59 | Admitting: Family Medicine

## 2018-09-14 ENCOUNTER — Other Ambulatory Visit: Payer: Self-pay

## 2018-09-14 VITALS — Temp 98.6°F | Wt 168.0 lb

## 2018-09-14 DIAGNOSIS — M79645 Pain in left finger(s): Secondary | ICD-10-CM | POA: Diagnosis not present

## 2018-09-14 NOTE — Telephone Encounter (Signed)
Pt has been placed on schedule for this afternoon

## 2018-09-14 NOTE — Telephone Encounter (Signed)
Pt states that her finger does not look like it is healing properly. Pt states her finger does not feel the way the other fingers feel. Has a tingling feeling. No drainage. There is one steri strip left on finger. Pt is not really able to bend finger either. Finger is stiff and tingly. There is some swelling. Red and tender. Pt has MyChart and was advised to send picture via MyChart also. Please advise. Thank you

## 2018-09-14 NOTE — Telephone Encounter (Signed)
Please arrange for the patient to come by so I can look at the finger this could be at the end of today or tomorrow

## 2018-09-14 NOTE — Telephone Encounter (Signed)
Pt had stitches removed on 7/31 and had the strips put on it and she states they have all come off but one and she states her finger doesn't look like its healing right.

## 2018-09-14 NOTE — Telephone Encounter (Signed)
Pt returned call and is coming by around 3:30. Do I need to place pt on the schedule or just let back know when she arrives.

## 2018-09-14 NOTE — Progress Notes (Signed)
   Subjective:    Patient ID: Ruth Robertson, female    DOB: 1965-04-03, 53 y.o.   MRN: [id number]  HPI Patient arrives to recheck finger Patient relates that she feels like the finger is very stiff.  She also states that the skin edges have not healed well together She presents today with no bandage on 1 Steri-Strips still in place no sign of infection she also relates that she is noticed over the past week numbness on the back of her finger distal to the cut but no numbness on the finger pad region she relates some soreness and pain around the laceration Review of Systems Relates pain discomfort around the laceration denies any sign of infection no drainage from it relates numbness around the distal portion from the laceration    Objective:   Physical Exam On physical exam there is no sign of infection but skin edges do show some surface separation but deep dermis appears to be together her finger is bent at approximately 20 degree angle at the PIP joint she is able to bring her finger in some but not beyond possibly 50 degrees at the PIP joint the DIP joint she is able to wiggle it.  Her extensor strength on the left side is not as good as her non-lacerated finger she does have numbness to monofilament distal to the laceration  Original laceration 08/27/2018 Was repaired in the ER 08/27/2018 Sutures removed 09/04/2018     Assessment & Plan:  I believe it would be best for the patient to be seen by hand specialist I believe the numbness distal to the laceration is related to cut of a sensory nerve. Best I can tell patient has functioning tendon function but stiffness related to the laceration in the healing She will probably benefit from physical therapy as well Referral to hand specialists

## 2018-09-18 ENCOUNTER — Encounter: Payer: Self-pay | Admitting: Family Medicine

## 2018-09-22 DIAGNOSIS — S61211A Laceration without foreign body of left index finger without damage to nail, initial encounter: Secondary | ICD-10-CM | POA: Diagnosis not present

## 2018-09-22 DIAGNOSIS — M79645 Pain in left finger(s): Secondary | ICD-10-CM | POA: Diagnosis not present

## 2018-09-24 ENCOUNTER — Other Ambulatory Visit: Payer: Self-pay | Admitting: Family Medicine

## 2018-09-24 DIAGNOSIS — Z1231 Encounter for screening mammogram for malignant neoplasm of breast: Secondary | ICD-10-CM

## 2018-09-29 ENCOUNTER — Encounter (HOSPITAL_COMMUNITY): Payer: Self-pay

## 2018-09-29 ENCOUNTER — Ambulatory Visit (HOSPITAL_COMMUNITY): Payer: 59 | Attending: Orthopaedic Surgery

## 2018-09-29 ENCOUNTER — Other Ambulatory Visit: Payer: Self-pay

## 2018-09-29 DIAGNOSIS — M79645 Pain in left finger(s): Secondary | ICD-10-CM | POA: Insufficient documentation

## 2018-09-29 DIAGNOSIS — R600 Localized edema: Secondary | ICD-10-CM | POA: Diagnosis not present

## 2018-09-29 DIAGNOSIS — R29898 Other symptoms and signs involving the musculoskeletal system: Secondary | ICD-10-CM | POA: Diagnosis not present

## 2018-09-29 DIAGNOSIS — M25642 Stiffness of left hand, not elsewhere classified: Secondary | ICD-10-CM | POA: Diagnosis not present

## 2018-09-29 NOTE — Patient Instructions (Signed)
Scar Massage  CIRCLES o Using two fingers make small circles over the length of the scar and to the skin around the scar.  ROLLING o Pinch a small amount of the scar tissue between your thumb and first two fingers. To roll the scar,walk your first two fingers forward and then slide your thumb forward to keep the "hill" in the scar.Do this along the length of the scar. o Use two fingers from one hand and two from the other. Pull your fingers on one hand while pushing the fingers of the other across the scar in a sawing motion. If the scar is located where you can only use one hand,pull then push your fingers across the length of the scar.    Edema Massage  To assist with the swelling, massage your hand / wrist from finger tips toward shoulder, massaging past the injured and swollen area.    AROM: DIP Flexion / Extension   Pinch middle knuckle of ________ finger of right hand to prevent bending. Bend end knuckle until stretch is felt. Hold ____ seconds. Relax. Straighten finger as far as possible. Repeat ____ times per set. Do ____ sets per session. Do ____ sessions per day.  Copyright  [employer]. All rights reserved.    AROM: PIP Flexion / Extension   Pinch bottom knuckle of ___index_____ finger of right hand to prevent bending. Actively bend middle knuckle until stretch is felt. Hold __5__ seconds. Relax. Straighten finger as far as possible. Repeat _10___ times per set. Do _1___ sets per session. Do __2-3__ sessions per day.    Paper Crumpling Exercise   Begin with right palm down on piece of paper. Maintaining contact between surface and heel of hand, crumple paper into a ball. Repeat _10___ times per set. Do ____ sets per session. Do ____ sessions per day.   Home Exercises Program Theraputty Exercises  Do the following exercises 2-3 times a day using your affected hand.  1. Roll putty into a ball.  2. Make into a pancake.  3. Roll putty into a log.  4. Pinch along  log with first finger and thumb.   5. Make into a ball.  6. Roll it back into a log.   7. Pinch using thumb and side of first finger.  8. Roll into a ball, then flatten into a pancake.  9. Using your fingers, make putty into a mountain.  10. Grip and release ball of putty 10-12 times.

## 2018-09-30 NOTE — Therapy (Signed)
[place], [place], [address] Phone: [phone]   Fax:  [phone]  Occupational Therapy Evaluation  Patient Details  Name: Ruth Robertson MRN: [id number] Date of Birth: Mar 23, 1965 Referring Provider (OT): Harlin Heys, MD   Encounter Date: 09/29/2018  OT End of Session - 09/30/18 1109    Visit Number  1    Number of Visits  8    Date for OT Re-Evaluation  10/27/18    Authorization Time Period  Clinical eligibility checked after 25th visit    Authorization - Visit Number  1    Authorization - Number of Visits  25    OT Start Time  [id number]    OT Stop Time  [id number]    OT Time Calculation (min)  52 min    Activity Tolerance  Patient tolerated treatment well    Behavior During Therapy  [facility] for tasks assessed/performed       Past Medical History:  Diagnosis Date  . Anxiety   . Asthma    ONLY WITH BRONCHITIS  . GAD (generalized anxiety disorder)   . Microscopic hematuria 12/03/2017   Negative cystoscopy negative CAT scan  . Pneumonia   . PONV (postoperative nausea and vomiting)     Past Surgical History:  Procedure Laterality Date  . AGILE CAPSULE N/A 07/09/2016   Procedure: AGILE CAPSULE;  Surgeon: Danie Binder, MD;  [facility];  Service: Endoscopy;  Laterality: N/A;  7:30am, pt to arrive at 7:00am  . BIOPSY  09/19/2015   Procedure: BIOPSY;  Surgeon: Danie Binder, MD;  [facility];  Service: Endoscopy;;  random colon bx's  . COLONOSCOPY WITH PROPOFOL N/A 09/19/2015   Dr. Oneida Alar: five 2-3 mm polyps in rectum (hyperplastic) and descending colon, diverticulosis in sigmoid colon and transverse colon. Query post-infectious IBS. Next colonoscopy in 10 years   . GIVENS CAPSULE STUDY N/A 07/30/2016   Procedure: GIVENS CAPSULE STUDY;  Surgeon: Danie Binder, MD;  [facility];  Service: Endoscopy;  Laterality: N/A;  . HYSTEROSCOPY  2006  . POLYPECTOMY  09/19/2015   Procedure: POLYPECTOMY;  Surgeon: Danie Binder, MD;  [facility];  Service: Endoscopy;;  descending colon polyps x3 cold bx, rectal polyp x2  . SALPINGOOPHORECTOMY Bilateral 08/18/2012   Procedure: SALPINGO OOPHORECTOMY;  Surgeon: Anastasio Auerbach, MD;  Location: [facility];  Service: Gynecology;  Laterality: Bilateral;  . VAGINAL HYSTERECTOMY N/A 08/18/2012   Procedure: HYSTERECTOMY VAGINAL;  Surgeon: Anastasio Auerbach, MD;  Location: [facility];  Service: Gynecology;  Laterality: N/A;    There were no vitals filed for this visit.  Subjective Assessment - 09/29/18 1737    Currently in Pain?  Yes    Pain Score  2     Pain Location  Finger (Comment which one)   index   Pain Orientation  Left    Pain Descriptors / Indicators  Sore    Pain Type  Acute pain    Pain Radiating Towards  N/A    Pain Onset  More than a month ago    Pain Frequency  Occasional   with use   Aggravating Factors   Use and movement    Pain Relieving Factors  rest    Effect of Pain on Daily Activities  Patient is starting to push through it    Multiple Pain Sites  No        [facility] OT Assessment - 09/29/18  1740      Assessment   Medical Diagnosis  Left hand index finger stiffness/pain    Referring Provider (OT)  Harlin Heys, MD    Onset Date/Surgical Date  08/27/18    Hand Dominance  Right    Next MD Visit  --   In september   Prior Therapy  none for this       Precautions   Precautions  None      Restrictions   Weight Bearing Restrictions  No      Balance Screen   Has the patient fallen in the past 6 months  Yes    How many times?  1    Has the patient had a decrease in activity level because of a fear of falling?   No    Is the patient reluctant to leave their home because of a fear of falling?   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      Mobility   Mobility Status  Independent      Written Expression   Dominant Hand  Right      Vision - History   Baseline Vision  No visual deficits      Cognition   Overall Cognitive Status  Within Functional Limits for tasks assessed      Observation/Other Assessments   Other Surveys   Select    Quick DASH   43.18      Sensation   Light Touch  Appears Intact    Additional Comments  Pt reports history of numbness/tingling in her fingers and feet. She does not know the source.      Coordination   Gross Motor Movements are Fluid and Coordinated  Yes    Fine Motor Movements are Fluid and Coordinated  Yes    9 Hole Peg Test  Right;Left    Right 9 Hole Peg Test  21.6"    Left 9 Hole Peg Test  22.1"      Edema   Edema  Slight edema noted in left index finger MCP joint and PIP joint      ROM / Strength   AROM / PROM / Strength  AROM;PROM;Strength      Palpation   Palpation comment  Min fascial restrictions and scar restrictions noted along healed incision.      Strength   Overall Strength Comments  Wrist flexion/extension MMT: 5/5 BUE    Strength Assessment Site  Hand    Right/Left hand  Left;Right    Right Hand Grip (lbs)  55    Right Hand Lateral Pinch  15 lbs    Right Hand 3 Point Pinch  14 lbs    Left Hand Grip (lbs)  35    Left Hand Lateral Pinch  10 lbs    Left Hand 3 Point Pinch  9 lbs      Left Hand AROM   L Index  MCP 0-90  62 Degrees    L Index PIP 0-100  82 Degrees    L Index DIP 0-70  58 Degrees      Left Hand PROM   L Index  MCP 0-90  62 Degrees    L Index PIP 0-100  88 Degrees    L Index DIP 0-70  58 Degrees          Quick Dash - 09/29/18 1743    Open a tight or new jar  Moderate  difficulty    Do heavy household chores (wash walls, wash floors)  Moderate difficulty    Carry a shopping bag or briefcase  Unable    Wash your back  No difficulty    Use a knife to cut food  Unable    Recreational activities in which you take some force or impact through your arm, shoulder, or hand (golf, hammering, tennis)  Unable    During the past week, to what extent has your arm, shoulder or hand problem interfered with your normal social activities with family, friends, neighbors, or groups?  Not at all    During the past week, to what extent has your arm, shoulder or hand problem limited your work or other regular daily activities  Slightly    Arm, shoulder, or hand pain.  Moderate    Tingling (pins and needles) in your arm, shoulder, or hand  None    Difficulty Sleeping  No difficulty    DASH Score  43.18 %                 OT Education - 09/30/18 1107    Education Details  Reviewed therapy goals, evaluation findings, yellow theraputty for grip and pinch strengthening, A/ROM finger exercises, joint blocking for DIP and PIP joint. Scar massage and edema massage    Person(s) Educated  Patient    Methods  Explanation;Demonstration;Handout;Verbal cues    Comprehension  Verbalized understanding;Returned demonstration       OT Short Term Goals - 09/30/18 1238      OT SHORT TERM GOAL #1   Title  Patient will be educated and demonstrate independence with HEP in order to increase functional use of her left hand while decreasing difficulty when typing.    Time  4    Period  Weeks    Status  New    Target Date  10/27/18      OT SHORT TERM GOAL #2   Title  Patient will report a decrease in pain level in her left hand index finger of approximately 2/10 during grip and pinch tasks with use of pain management techniques    Time  4    Period  Weeks    Status  New      OT SHORT TERM GOAL #3   Title  Patient will increase A/ROM of left hand to WNL by demonstrating the ability  to make a closed fist while increasing her abilty to hold onto small items with less difficulty and risk of dropping.    Time  4    Period  Weeks    Status  New      OT SHORT TERM GOAL #4   Title  Patient will decrease fascial restrictions in left hand to trace amount in order to increase functional mobility needed to manipulate small items.    Time  4    Period  Weeks    Status  New      OT SHORT TERM GOAL #5   Title  Patient will increase her grip strength  by 10# and pinch strength by 5# in order to increase her ability to open tight/new jars and containers and move wet laundry from the washer to the dryer.    Time  4    Period  Weeks    Status  New               Plan - 09/30/18 1235    Clinical Impression Statement  A: Patient is a 53 y/o female S/P left hand index finger pain and stiffness due to recent finger laceration causing increased swelling, fascial restrictions, decreased ROM and strength resulting in difficulty completing her daily and work related tasks with her left hand.    OT Occupational Profile and History  Problem Focused Assessment - Including review of records relating to presenting problem    Occupational performance deficits (Please refer to evaluation for details):  ADL's;IADL's;Work    Body Structure / Function / Physical Skills  ADL;Strength;Pain;Dexterity;Edema;UE functional use;ROM;IADL;Fascial restriction;Scar mobility;Flexibility    Rehab Potential  Excellent    Clinical Decision Making  Limited treatment options, no task modification necessary    Comorbidities Affecting Occupational Performance:  None    Modification or Assistance to Complete Evaluation   No modification of tasks or assist necessary to complete eval    OT Frequency  2x / week    OT Duration  4 weeks    OT Treatment/Interventions  Self-care/ADL training;Moist Heat;DME and/or AE instruction;Splinting;Therapeutic activities;Compression bandaging;Ultrasound;Therapeutic exercise;Scar  mobilization;Passive range of motion;Neuromuscular education;Cryotherapy;Electrical Stimulation;Patient/family education;Manual Therapy    Plan  P: Patient will benefit from skilled OT services to increase functional use of her left hand during daily and leisure tasks. Treatment plan: Scar mobility and myofascial release, passive stretching, A/ROM, grip and pinch strengthening. Modalities PRN.    Consulted and Agree with Plan of Care  Patient       Patient will benefit from skilled therapeutic intervention in order to improve the following deficits and impairments:   Body Structure / Function / Physical Skills: ADL, Strength, Pain, Dexterity, Edema, UE functional use, ROM, IADL, Fascial restriction, Scar mobility, Flexibility       Visit Diagnosis: Other symptoms and signs involving the musculoskeletal system - Plan: Ot plan of care cert/re-cert  Pain in left finger(s) - Plan: Ot plan of care cert/re-cert  Stiffness of left hand, not elsewhere classified - Plan: Ot plan of care cert/re-cert  Localized edema - Plan: Ot plan of care cert/re-cert    Problem List Patient Active Problem List   Diagnosis Date Noted  . Microscopic hematuria 12/03/2017  . Partial small bowel obstruction ([place]) 06/19/2017  . Enteritis 06/19/2017  . Intractable vomiting 06/19/2017  . Tobacco abuse 06/19/2017  . Depression with anxiety 03/26/2011   Ailene Ravel, OTR/L,CBIS  [phone]  09/30/2018, 12:46 PM  [place] [address] [place], [place], [address] Phone: [phone]   Fax:  [phone]  Name: Ruth Robertson MRN: [id number] Date of Birth: [birth year]

## 2018-10-05 ENCOUNTER — Ambulatory Visit (HOSPITAL_COMMUNITY): Payer: 59 | Admitting: Occupational Therapy

## 2018-10-05 ENCOUNTER — Encounter (HOSPITAL_COMMUNITY): Payer: Self-pay | Admitting: Occupational Therapy

## 2018-10-05 ENCOUNTER — Other Ambulatory Visit: Payer: Self-pay

## 2018-10-05 DIAGNOSIS — R29898 Other symptoms and signs involving the musculoskeletal system: Secondary | ICD-10-CM | POA: Diagnosis not present

## 2018-10-05 DIAGNOSIS — M25642 Stiffness of left hand, not elsewhere classified: Secondary | ICD-10-CM | POA: Diagnosis not present

## 2018-10-05 DIAGNOSIS — M79645 Pain in left finger(s): Secondary | ICD-10-CM | POA: Diagnosis not present

## 2018-10-05 DIAGNOSIS — R600 Localized edema: Secondary | ICD-10-CM

## 2018-10-05 NOTE — Therapy (Signed)
[place], [place], [address] Phone: [phone]   Fax:  [phone]  Occupational Therapy Treatment  Patient Details  Name: Ruth Robertson MRN: [id number] Date of Birth: [birth year] Referring Provider (OT): Harlin Heys, MD   Encounter Date: 10/05/2018  OT End of Session - 10/05/18 1556    Visit Number  2    Number of Visits  8    Date for OT Re-Evaluation  10/27/18    Authorization Time Period  Clinical eligibility checked after 25th visit    Authorization - Visit Number  2    Authorization - Number of Visits  25    OT Start Time  [id number]    OT Stop Time  1546    OT Time Calculation (min)  32 min    Activity Tolerance  Patient tolerated treatment well    Behavior During Therapy  [facility] for tasks assessed/performed       Past Medical History:  Diagnosis Date  . Anxiety   . Asthma    ONLY WITH BRONCHITIS  . GAD (generalized anxiety disorder)   . Microscopic hematuria 12/03/2017   Negative cystoscopy negative CAT scan  . Pneumonia   . PONV (postoperative nausea and vomiting)     Past Surgical History:  Procedure Laterality Date  . AGILE CAPSULE N/A 07/09/2016   Procedure: AGILE CAPSULE;  Surgeon: Danie Binder, MD;  [facility];  Service: Endoscopy;  Laterality: N/A;  7:30am, pt to arrive at 7:00am  . BIOPSY  09/19/2015   Procedure: BIOPSY;  Surgeon: Danie Binder, MD;  [facility];  Service: Endoscopy;;  random colon bx's  . COLONOSCOPY WITH PROPOFOL N/A 09/19/2015   Dr. Oneida Alar: five 2-3 mm polyps in rectum (hyperplastic) and descending colon, diverticulosis in sigmoid colon and transverse colon. Query post-infectious IBS. Next colonoscopy in 10 years   . GIVENS CAPSULE STUDY N/A 07/30/2016   Procedure: GIVENS CAPSULE STUDY;  Surgeon: Danie Binder, MD;  [facility];  Service: Endoscopy;  Laterality: N/A;  . HYSTEROSCOPY  2006  . POLYPECTOMY  09/19/2015   Procedure: POLYPECTOMY;  Surgeon: Danie Binder, MD;  [facility];  Service: Endoscopy;;  descending colon polyps x3 cold bx, rectal polyp x2  . SALPINGOOPHORECTOMY Bilateral 08/18/2012   Procedure: SALPINGO OOPHORECTOMY;  Surgeon: Anastasio Auerbach, MD;  Location: [facility];  Service: Gynecology;  Laterality: Bilateral;  . VAGINAL HYSTERECTOMY N/A 08/18/2012   Procedure: HYSTERECTOMY VAGINAL;  Surgeon: Anastasio Auerbach, MD;  Location: [facility];  Service: Gynecology;  Laterality: N/A;    There were no vitals filed for this visit.  Subjective Assessment - 10/05/18 1513    Subjective   S: It's really gotten a lot better.    Patient Stated Goals  To be able to use my finger and hand and have less pain.    Currently in Pain?  Yes    Pain Score  2     Pain Location  Finger (Comment which one)   index   Pain Orientation  Left    Pain Descriptors / Indicators  Sore    Pain Type  Acute pain    Pain Radiating Towards  N/A    Pain Onset  More than a month ago    Pain Frequency  Occasional   with use   Aggravating Factors   use    Pain Relieving Factors  rest    Effect of Pain  on Daily Activities  min effect    Multiple Pain Sites  No         [facility] OT Assessment - 10/05/18 1513      Assessment   Medical Diagnosis  Left hand index finger stiffness/pain      Precautions   Precautions  None               OT Treatments/Exercises (OP) - 10/05/18 1513      Exercises   Exercises  Wrist;Hand;Theraputty      Additional Wrist Exercises   Sponges  25    Hand Gripper with Large Beads  all beads gripper at 25#    Hand Gripper with Medium Beads  all beads gripper at 25#    Hand Gripper with Small Beads  all beads gripper at 25#      Hand Exercises   Other Hand Exercises  towel crumple using only index and middle fingers, 10X    Other Hand Exercises  Pt using index finger to push medium sized bead up yellow theraputty the using index finger to pull back down, 5X       Theraputty   Theraputty - Roll  yellow-left    Theraputty - Grip  yellow-pronated and supinated    Theraputty - Pinch  yellow-3 pt and lateral      Manual Therapy   Manual Therapy  Soft tissue mobilization    Manual therapy comments  completed separately     Soft tissue mobilization  scar release to left index finger to improve ROM and reduce pain      Fine Motor Coordination (Hand/Wrist)   Fine Motor Coordination  Tendon glides;Grooved pegs    Tendon Glides  10X    Grooved pegs  Pt holding 5 pegs in palm and working on palm to fingertip translation prior to placing in pegboard. Min difficulty with task. Pt then removed from pegboard and held all pegs in palm without difficulty.                OT Short Term Goals - 10/05/18 1513      OT SHORT TERM GOAL #1   Title  Patient will be educated and demonstrate independence with HEP in order to increase functional use of her left hand while decreasing difficulty when typing.    Time  4    Period  Weeks    Status  On-going    Target Date  10/27/18      OT SHORT TERM GOAL #2   Title  Patient will report a decrease in pain level in her left hand index finger of approximately 2/10 during grip and pinch tasks with use of pain management techniques    Time  4    Period  Weeks    Status  On-going      OT SHORT TERM GOAL #3   Title  Patient will increase A/ROM of left hand to WNL by demonstrating the ability to make a closed fist while increasing her abilty to hold onto small items with less difficulty and risk of dropping.    Time  4    Period  Weeks    Status  On-going      OT SHORT TERM GOAL #4   Title  Patient will decrease fascial restrictions in left hand to trace amount in order to increase functional mobility needed to manipulate small items.    Time  4    Period  Weeks    Status  On-going      OT SHORT TERM GOAL #5   Title  Patient will increase her grip strength by 10# and pinch strength by 5# in order to increase her  ability to open tight/new jars and containers and move wet laundry from the washer to the dryer.    Time  4    Period  Weeks    Status  On-going               Plan - 10/05/18 1557    Clinical Impression Statement  A: Pt reports great success with HEP, now able to make a fist and use finger more. Scar massage completed to improve scar mobility. Session focusing on A/ROM, grip and pinch strength, and in-hand manipulation. Pt with occasional fatigue, verbal cuing for form and technique.    Body Structure / Function / Physical Skills  ADL;Strength;Pain;Dexterity;Edema;UE functional use;ROM;IADL;Fascial restriction;Scar mobility;Flexibility    Plan  P: Continue with grip and pinch tasks, add clothespin activity. Discuss decreasing to 1x/week       Patient will benefit from skilled therapeutic intervention in order to improve the following deficits and impairments:   Body Structure / Function / Physical Skills: ADL, Strength, Pain, Dexterity, Edema, UE functional use, ROM, IADL, Fascial restriction, Scar mobility, Flexibility       Visit Diagnosis: Other symptoms and signs involving the musculoskeletal system  Pain in left finger(s)  Stiffness of left hand, not elsewhere classified  Localized edema    Problem List Patient Active Problem List   Diagnosis Date Noted  . Microscopic hematuria 12/03/2017  . Partial small bowel obstruction ([place]) 06/19/2017  . Enteritis 06/19/2017  . Intractable vomiting 06/19/2017  . Tobacco abuse 06/19/2017  . Depression with anxiety 03/26/2011   Guadelupe Sabin, OTR/L  [phone] 10/05/2018, 3:59 PM  [place] [address] [place], [place], [address] Phone: [phone]   Fax:  [phone]  Name: CHAUNTEE ROSENCRANS MRN: [id number] Date of Birth: Mar 18, 1965

## 2018-10-08 ENCOUNTER — Other Ambulatory Visit: Payer: Self-pay

## 2018-10-08 ENCOUNTER — Ambulatory Visit (HOSPITAL_COMMUNITY): Payer: 59 | Attending: Orthopaedic Surgery | Admitting: Occupational Therapy

## 2018-10-08 ENCOUNTER — Encounter (HOSPITAL_COMMUNITY): Payer: Self-pay | Admitting: Occupational Therapy

## 2018-10-08 DIAGNOSIS — R29898 Other symptoms and signs involving the musculoskeletal system: Secondary | ICD-10-CM | POA: Diagnosis not present

## 2018-10-08 DIAGNOSIS — M25642 Stiffness of left hand, not elsewhere classified: Secondary | ICD-10-CM | POA: Insufficient documentation

## 2018-10-08 DIAGNOSIS — M79645 Pain in left finger(s): Secondary | ICD-10-CM | POA: Insufficient documentation

## 2018-10-08 NOTE — Therapy (Signed)
[place], [place], [address] Phone: [phone]   Fax:  [phone]  Occupational Therapy Reassessment, Treatment, Discharge  Patient Details  Name: Ruth Robertson MRN: [id number] Date of Birth: 11/03/1965 Referring Provider (OT): Harlin Heys, MD   Encounter Date: 10/08/2018  OT End of Session - 10/08/18 1711    Visit Number  3    Number of Visits  8    Date for OT Re-Evaluation  10/27/18    Authorization Time Period  Clinical eligibility checked after 25th visit    Authorization - Visit Number  3    Authorization - Number of Visits  25    OT Start Time  6256    OT Stop Time  1706    OT Time Calculation (min)  23 min    Activity Tolerance  Patient tolerated treatment well    Behavior During Therapy  [facility] for tasks assessed/performed       Past Medical History:  Diagnosis Date  . Anxiety   . Asthma    ONLY WITH BRONCHITIS  . GAD (generalized anxiety disorder)   . Microscopic hematuria 12/03/2017   Negative cystoscopy negative CAT scan  . Pneumonia   . PONV (postoperative nausea and vomiting)     Past Surgical History:  Procedure Laterality Date  . AGILE CAPSULE N/A 07/09/2016   Procedure: AGILE CAPSULE;  Surgeon: Danie Binder, MD;  [facility];  Service: Endoscopy;  Laterality: N/A;  7:30am, pt to arrive at 7:00am  . BIOPSY  09/19/2015   Procedure: BIOPSY;  Surgeon: Danie Binder, MD;  [facility]: [facility];  Service: Endoscopy;;  random colon bx's  . COLONOSCOPY WITH PROPOFOL N/A 09/19/2015   Dr. Oneida Alar: five 2-3 mm polyps in rectum (hyperplastic) and descending colon, diverticulosis in sigmoid colon and transverse colon. Query post-infectious IBS. Next colonoscopy in 10 years   . GIVENS CAPSULE STUDY N/A 07/30/2016   Procedure: GIVENS CAPSULE STUDY;  Surgeon: Danie Binder, MD;  [facility];  Service: Endoscopy;  Laterality: N/A;  . HYSTEROSCOPY  2006  .  POLYPECTOMY  09/19/2015   Procedure: POLYPECTOMY;  Surgeon: Danie Binder, MD;  [facility];  Service: Endoscopy;;  descending colon polyps x3 cold bx, rectal polyp x2  . SALPINGOOPHORECTOMY Bilateral 08/18/2012   Procedure: SALPINGO OOPHORECTOMY;  Surgeon: Anastasio Auerbach, MD;  Location: [facility];  Service: Gynecology;  Laterality: Bilateral;  . VAGINAL HYSTERECTOMY N/A 08/18/2012   Procedure: HYSTERECTOMY VAGINAL;  Surgeon: Anastasio Auerbach, MD;  Location: [facility];  Service: Gynecology;  Laterality: N/A;    There were no vitals filed for this visit.  Subjective Assessment - 10/08/18 1642    Subjective   S: It think it's doing great.    Currently in Pain?  No/denies         [facility] OT Assessment - 10/08/18 1642      Assessment   Medical Diagnosis  Left hand index finger stiffness/pain      Precautions   Precautions  None      Strength   Left Hand Grip (lbs)  55   35 previous   Left Hand Lateral Pinch  13 lbs   10 previous   Left Hand 3 Point Pinch  13 lbs   9 previous     Left Hand AROM   L Index  MCP 0-90  75 Degrees    L Index PIP 0-100  90 Degrees  L Index DIP 0-70  60 Degrees               OT Treatments/Exercises (OP) - 10/08/18 1653      Exercises   Exercises  Wrist;Hand;Theraputty      Additional Wrist Exercises   Sponges  Pt used green clothespin to grasp and stack 4 towers of 5 sponges alternating 3 point and lateral pinch    Hand Gripper with Large Beads  all beads gripper at 42#    Hand Gripper with Medium Beads  all beads gripper at 42#    Hand Gripper with Small Beads  all beads gripper at 37#             OT Education - 10/08/18 1710    Education Details  provided tendon glides and upgraded to red theraputty    Person(s) Educated  Patient    Methods  Explanation;Demonstration;Handout;Verbal cues    Comprehension  Verbalized understanding;Returned demonstration       OT Short Term Goals - 10/08/18 1711      OT SHORT  TERM GOAL #1   Title  Patient will be educated and demonstrate independence with HEP in order to increase functional use of her left hand while decreasing difficulty when typing.    Time  4    Period  Weeks    Status  Achieved    Target Date  10/27/18      OT SHORT TERM GOAL #2   Title  Patient will report a decrease in pain level in her left hand index finger of approximately 2/10 during grip and pinch tasks with use of pain management techniques    Time  4    Period  Weeks    Status  Achieved      OT SHORT TERM GOAL #3   Title  Patient will increase A/ROM of left hand to WNL by demonstrating the ability to make a closed fist while increasing her abilty to hold onto small items with less difficulty and risk of dropping.    Time  4    Period  Weeks    Status  Achieved      OT SHORT TERM GOAL #4   Title  Patient will decrease fascial restrictions in left hand to trace amount in order to increase functional mobility needed to manipulate small items.    Time  4    Period  Weeks    Status  Achieved      OT SHORT TERM GOAL #5   Title  Patient will increase her grip strength by 10# and pinch strength by 5# in order to increase her ability to open tight/new jars and containers and move wet laundry from the washer to the dryer.    Time  4    Period  Weeks    Status  Partially Met               Plan - 10/08/18 1712    Clinical Impression Statement  A: Pt reports her finger is feeling much better and she is using it normally during ADLs with occasional stiffness. Measurements taken and pt has met 4/5 goals and has partially met remaining goal. Pt is agreeable to discharge with HEP this session. Completed grip and pinch exercises today along with tendon glides. Reviewed HEP and upgraded to red theraputty.    Body Structure / Function / Physical Skills  ADL;Strength;Pain;Dexterity;Edema;UE functional use;ROM;IADL;Fascial restriction;Scar mobility;Flexibility    Plan  P: Discharge pt  Patient will benefit from skilled therapeutic intervention in order to improve the following deficits and impairments:   Body Structure / Function / Physical Skills: ADL, Strength, Pain, Dexterity, Edema, UE functional use, ROM, IADL, Fascial restriction, Scar mobility, Flexibility       Visit Diagnosis: Other symptoms and signs involving the musculoskeletal system  Pain in left finger(s)  Stiffness of left hand, not elsewhere classified    Problem List Patient Active Problem List   Diagnosis Date Noted  . Microscopic hematuria 12/03/2017  . Partial small bowel obstruction ([place]) 06/19/2017  . Enteritis 06/19/2017  . Intractable vomiting 06/19/2017  . Tobacco abuse 06/19/2017  . Depression with anxiety 03/26/2011   Guadelupe Sabin, OTR/L  [phone] 10/08/2018, 5:14 PM  [facility] [address] [place], [place], [address] Phone: [phone]   Fax:  [phone]  Name: Ruth Robertson MRN: [id number] Date of Birth: [birth year]   OCCUPATIONAL THERAPY DISCHARGE SUMMARY  Visits from Start of Care: 3  Current functional level related to goals / functional outcomes: See above. Pt is demonstrating ROM and strength WNL for left index finger and is using it for functional tasks. Pt is ready for discharge with HEP   Remaining deficits: Stiffness, occasional edema   Education / Equipment: HEP for grip and pinch strengthening, tendon glides Plan: Patient agrees to discharge.  Patient goals were met. Patient is being discharged due to meeting the stated rehab goals.  ?????

## 2018-10-14 ENCOUNTER — Ambulatory Visit (HOSPITAL_COMMUNITY): Payer: 59

## 2018-10-20 ENCOUNTER — Encounter (HOSPITAL_COMMUNITY): Payer: 59

## 2018-10-20 ENCOUNTER — Telehealth: Payer: Self-pay | Admitting: Family Medicine

## 2018-10-20 MED ORDER — CEPHALEXIN 500 MG PO CAPS: 500.0000 mg | ORAL_CAPSULE | Freq: Four times a day (QID) | ORAL | 0 refills | Status: DC

## 2018-10-20 NOTE — Telephone Encounter (Signed)
Patient scheduled visit to discuss ordering MRI and stated she will follow up if the spot of infection doesn't clear up with antibiotic.

## 2018-10-20 NOTE — Telephone Encounter (Signed)
This is something that we can do but we will need to do a office visit beforehand.  This can either be in person or virtual next week At that time then we can order the MRI and work on getting it covered

## 2018-10-20 NOTE — Telephone Encounter (Signed)
Keflex 500 mg 1 4 times daily for 7 days if any ongoing signs of infection or problems I would like to see her May do warm compresses as needed

## 2018-10-20 NOTE — Telephone Encounter (Signed)
Patient finished her antibiotic but still has a spot of infection.  Should she be on another antibiotic?

## 2018-10-20 NOTE — Telephone Encounter (Signed)
Prescription sent electronically to pharmacy. Patient notified.   Patient stated her GI specialist told her it was time to have her yearly MRI of her liver and wondered if that was something we could get approved and scheduled ( we did it for her once in the past patient stated) If we can ddo it will she need office visit to have notes for the prior auth  Please advise

## 2018-10-22 ENCOUNTER — Encounter (HOSPITAL_COMMUNITY): Payer: 59

## 2018-10-27 ENCOUNTER — Encounter (HOSPITAL_COMMUNITY): Payer: 59

## 2018-10-27 DIAGNOSIS — S61211A Laceration without foreign body of left index finger without damage to nail, initial encounter: Secondary | ICD-10-CM | POA: Diagnosis not present

## 2018-10-28 ENCOUNTER — Encounter: Payer: Self-pay | Admitting: Gynecology

## 2018-10-29 ENCOUNTER — Other Ambulatory Visit: Payer: Self-pay

## 2018-10-29 ENCOUNTER — Ambulatory Visit (INDEPENDENT_AMBULATORY_CARE_PROVIDER_SITE_OTHER): Payer: 59 | Admitting: Family Medicine

## 2018-10-29 ENCOUNTER — Encounter (HOSPITAL_COMMUNITY): Payer: 59 | Admitting: Occupational Therapy

## 2018-10-29 DIAGNOSIS — E785 Hyperlipidemia, unspecified: Secondary | ICD-10-CM

## 2018-10-29 DIAGNOSIS — M5432 Sciatica, left side: Secondary | ICD-10-CM

## 2018-10-29 DIAGNOSIS — K769 Liver disease, unspecified: Secondary | ICD-10-CM

## 2018-10-29 DIAGNOSIS — D1803 Hemangioma of intra-abdominal structures: Secondary | ICD-10-CM

## 2018-10-29 DIAGNOSIS — M545 Low back pain, unspecified: Secondary | ICD-10-CM

## 2018-10-29 DIAGNOSIS — M2550 Pain in unspecified joint: Secondary | ICD-10-CM | POA: Diagnosis not present

## 2018-10-29 DIAGNOSIS — Z79899 Other long term (current) drug therapy: Secondary | ICD-10-CM

## 2018-10-29 DIAGNOSIS — K7689 Other specified diseases of liver: Secondary | ICD-10-CM | POA: Diagnosis not present

## 2018-10-29 NOTE — Progress Notes (Signed)
Subjective:    Patient ID: Ruth Robertson, female    DOB: [birth year], 53 y.o.   MRN: [id number]  HPI Patient stated she had a MRI of her liver last year and it showed a spot on her liver and her specialist at [facility] wants her to have it repeated now to make sure the spot is stable and she would like [place] to order it. Patient has a liver nodule This was seen on an MRI last year Is recommended to do a follow-up this year to ensure stability She denies any pelvic pain She does have some intermittent low back pain does radiate some down the legs denies any other particular troubles.  She does do stretching for this takes ibuprofen as needed Her finger is doing better she has good range of motion is healing much better  Patient with back pain discomfort radiates intermittently and does stretching exercises Patient does use tobacco products.  Patient knows they should quit.  Patient is aware that smoking/in use of tobacco products increases their risk of heart disease, cancer, and COPD- lung issues.  Patient has been counseled to quit smoking/tobacco products. no known injury been going on off and on for few weeks has had previous x-rays which show degenerative changes  Patient has arthralgias in the hands and feet knees has tried stretching exercises anti-inflammatory done really help a whole lot  Arthralgias been going on off and on for over a year  Had a hand injury but states this is actually feeling much better  Patient does smoke no she needs to quit  Virtual Visit via Video Note  I connected with Ruth Robertson on 10/29/18 at 10:00 AM EDT by a video enabled telemedicine application and verified that I am speaking with the correct person using two identifiers.  Location: Patient: home Provider: office   I discussed the limitations of evaluation and management by telemedicine and the availability of in person appointments. The patient expressed understanding and  agreed to proceed.  History of Present Illness:    Observations/Objective:   Assessment and Plan:   Follow Up Instructions:    I discussed the assessment and treatment plan with the patient. The patient was provided an opportunity to ask questions and all were answered. The patient agreed with the plan and demonstrated an understanding of the instructions.   The patient was advised to call back or seek an in-person evaluation if the symptoms worsen or if the condition fails to improve as anticipated.  I provided 25 minutes of non-face-to-face time during this encounter.      Review of Systems  Constitutional: Negative for activity change, appetite change and fatigue.  HENT: Negative for congestion and rhinorrhea.   Respiratory: Negative for cough and shortness of breath.   Cardiovascular: Negative for chest pain and leg swelling.  Gastrointestinal: Negative for abdominal pain and diarrhea.  Endocrine: Negative for polydipsia and polyphagia.  Skin: Negative for color change.  Neurological: Negative for dizziness and weakness.  Psychiatric/Behavioral: Negative for behavioral problems and confusion.   Patient had virtual visit Appears to be in no distress Atraumatic Neuro able to relate and oriented No apparent resp distress Color normal     Objective:   Physical Exam    Assessment & Plan:  1. Hepatic hemangioma This needs repeat MRI in order to look at this.  We will go ahead and order the liver MRI.  With and without contrast  2. Liver nodule Please see above  3. 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 Lumbar pain Lumbar pain the best approach at this point is stretching exercises hold off on any type of MRI no new intervention necessary currently   5. Left sided sciatica Once again stretching exercises ibuprofen if progressive or weakness or other measures then MRI  6. Liver disease Because of liver nodule patient does need follow-up MRI await results results has seen gastroenterology at [facility] no need to go back just yet depending on the results - MR LIVER W [place] - Hepatic function panel  7. Hyperlipidemia, unspecified hyperlipidemia type Hyperlipidemia recommended to check lipid profile watch diet stay active - Lipid panel  8. High risk medication use Because medication check met 7 - Basic metabolic panel  Patient is a smoker we recommend for her to quit she states she will consider other options  25 minutes was spent with the patient.  This statement verifies that 25 minutes was indeed spent with the patient.  More than 50% of this visit-total duration of the visit-was spent in counseling and coordination of care. The issues that the patient came in for today as reflected in the diagnosis (s) please refer to documentation for further details.

## 2018-11-03 ENCOUNTER — Encounter (HOSPITAL_COMMUNITY): Payer: 59 | Admitting: Occupational Therapy

## 2018-11-05 ENCOUNTER — Encounter (HOSPITAL_COMMUNITY): Payer: 59 | Admitting: Occupational Therapy

## 2018-11-16 ENCOUNTER — Ambulatory Visit (HOSPITAL_COMMUNITY)
Admission: RE | Admit: 2018-11-16 | Discharge: 2018-11-16 | Disposition: A | Discharge status: Home / Self Care | Payer: 59 | Source: Ambulatory Visit | Attending: Family Medicine | Admitting: Family Medicine

## 2018-11-16 ENCOUNTER — Other Ambulatory Visit: Payer: Self-pay

## 2018-11-16 DIAGNOSIS — K769 Liver disease, unspecified: Secondary | ICD-10-CM | POA: Insufficient documentation

## 2018-11-16 MED ORDER — SODIUM CHLORIDE FLUSH 0.9 % IV SOLN
INTRAVENOUS | Status: AC
  Filled 2018-11-16: qty 300

## 2018-11-16 MED ORDER — GADOXETATE DISODIUM 0.25 MMOL/ML IV SOLN
8.0000 mL | Freq: Once | INTRAVENOUS | Status: AC | PRN
  Administered 2018-11-16: 8 mL via INTRAVENOUS

## 2018-11-16 MED ORDER — GADOBUTROL 1 MMOL/ML IV SOLN: 8.0000 mL | Freq: Once | INTRAVENOUS | Status: DC | PRN

## 2018-11-16 MED ORDER — SODIUM CHLORIDE (PF) 0.9 % IJ SOLN
INTRAMUSCULAR | Status: AC
  Filled 2018-11-16: qty 500

## 2018-11-20 ENCOUNTER — Other Ambulatory Visit: Payer: Self-pay

## 2018-11-20 ENCOUNTER — Ambulatory Visit
Admission: RE | Admit: 2018-11-20 | Discharge: 2018-11-20 | Disposition: A | Discharge status: Home / Self Care | Payer: 59 | Source: Ambulatory Visit | Attending: Family Medicine | Admitting: Family Medicine

## 2018-11-20 DIAGNOSIS — Z1231 Encounter for screening mammogram for malignant neoplasm of breast: Secondary | ICD-10-CM | POA: Diagnosis not present

## 2018-12-02 ENCOUNTER — Other Ambulatory Visit: Payer: Self-pay

## 2018-12-03 ENCOUNTER — Ambulatory Visit (INDEPENDENT_AMBULATORY_CARE_PROVIDER_SITE_OTHER): Payer: 59 | Admitting: Gynecology

## 2018-12-03 ENCOUNTER — Encounter: Payer: Self-pay | Admitting: Gynecology

## 2018-12-03 VITALS — BP 118/76 | Ht 65.0 in | Wt 172.0 lb

## 2018-12-03 DIAGNOSIS — Z01419 Encounter for gynecological examination (general) (routine) without abnormal findings: Secondary | ICD-10-CM | POA: Diagnosis not present

## 2018-12-03 DIAGNOSIS — L02429 Furuncle of limb, unspecified: Secondary | ICD-10-CM | POA: Diagnosis not present

## 2018-12-03 NOTE — Patient Instructions (Signed)
The office will call to arrange an appointment with a general surgeon in reference to the boil.  Follow-up in 1 year for annual exam

## 2018-12-03 NOTE — Progress Notes (Signed)
    Ruth Robertson 12-16-1965 [id number]        53 y.o.  G1P1001 for annual gynecologic exam.  Has a boil in her groin area she wants me to look at.  Otherwise without gynecologic complaints  Past medical history,surgical history, problem list, medications, allergies, family history and social history were all reviewed and documented as reviewed in the EPIC chart.  ROS:  Performed with pertinent positives and negatives included in the history, assessment and plan.   Additional significant findings : None   Exam: Ruth Robertson assistant Vitals:   12/03/18 1629  BP: 118/76  Weight: 172 lb (78 kg)  Height: 5\' 5"  (1.651 m)   Body mass index is 28.62 kg/m.  General appearance:  Normal affect, orientation and appearance. Skin: Grossly normal HEENT: Without gross lesions.  No cervical or supraclavicular adenopathy. Thyroid normal.  Lungs:  Clear without wheezing, rales or rhonchi Cardiac: RR, without RMG Abdominal:  Soft, nontender, without masses, guarding, rebound, organomegaly or hernia Breasts:  Examined lying and sitting without masses, retractions, discharge or axillary adenopathy. Pelvic:  Ext, BUS, Vagina: Normal.  Small fluctuant boil-like blue domed area right groin.  Adnexa: Without masses or tenderness    Anus and perineum: Normal   Rectovaginal: Normal sphincter tone without palpated masses or tenderness.    Assessment/Plan:  53 y.o. G74P1001 female for annual gynecologic exam.  Status post TVH BSO 2014  1. Postmenopausal.  Without significant menopausal symptoms. 2. Chronic boil right groin.  Has been treated by her primary with several rounds of antibiotics and it seems to get better and then returns.  Fluctuant but noninflammatory consistent with a old chronic area.  Recommended general surgical referral for more aggressive treatment to include possible lancing or excising this area.  Patient agrees with this. 3. Mammography 11/2018.  Continue with annual  mammography next year.  Breast exam normal today. 4. Colonoscopy 2019.  Repeat at their recommended interval. 5. Pap smear 11/2017.  No Pap smear done today.  No history of significant abnormal Pap smears.  Options to stop screening per current screening guidelines based on hysterectomy history discussed.  Will readdress on annual basis. 6. DEXA reportedly done elsewhere several years ago.  Recommend repeating at age 69 given her smoking history. 7. Health maintenance.  No routine lab work done as patient does this elsewhere.  Follow-up 1 year, sooner as needed.   Anastasio Auerbach MD, 4:52 PM 12/03/2018

## 2018-12-04 ENCOUNTER — Telehealth: Payer: Self-pay | Admitting: *Deleted

## 2018-12-04 NOTE — Telephone Encounter (Signed)
-----   Message from Anastasio Auerbach, MD sent at 12/03/2018  4:57 PM EDT ----- General surgical referral reference chronic recurrent boil right inguinal area

## 2018-12-04 NOTE — Telephone Encounter (Signed)
Referral sent [facility] surgery they will call to schedule.

## 2018-12-15 NOTE — Telephone Encounter (Signed)
Spoke with patient, she did hear from [place] and made an apt. KW [name]

## 2018-12-21 ENCOUNTER — Ambulatory Visit: Payer: Self-pay | Admitting: Surgery

## 2018-12-21 DIAGNOSIS — L723 Sebaceous cyst: Secondary | ICD-10-CM | POA: Diagnosis not present

## 2018-12-21 NOTE — H&P (View-Only) (Signed)
H & P Notes Ruth Robertson Documented: 12/21/2018 11:36 AM Location: [facility] Patient #: [id number] DOB: [birth year] Divorced / Language: Ruth Robertson / Race: White Female  History of Present Illness Ruth Moores A. Duke Weisensel MD; 12/21/2018 12:00 PM) Patient words: Patient sent request of Dr. Phineas Robertson for chronic right groin cyst. This is been present for a number of months and will not resolve. It will swell up, drained and improved. Location is at the crease of her right groin between her right thigh and right inguinal region. It is been chronic and causes chronic pain and soreness. Currently, she has no fever, chills, redness or drainage.  The patient is a 53 year old female.   Past Surgical History Ruth Robertson, Ruth Robertson; 12/21/2018 11:44 AM) Colon Polyp Removal - Colonoscopy Hysterectomy (not due to cancer) - Complete Oral Surgery  Diagnostic Studies History Ruth Robertson, Ruth Robertson; 12/21/2018 11:44 AM) Colonoscopy 1-5 years ago Mammogram within last year Pap Smear 1-5 years ago  Allergies Ruth Robertson, [name]; 12/21/2018 11:45 AM) No Known Drug Allergies [12/21/2018]: Allergies Reconciled  Medication History Ruth Robertson, [name]; 12/21/2018 11:46 AM) buPROPion HCl ER (XL) (150MG  Tablet ER 24HR, Oral) Active. Omeprazole (20MG  Capsule DR, Oral) Active. Proventil (90MCG/ACT Aerosol Soln, Inhalation) Active. Albuterol Sulfate (0.63MG /3ML Nebulized Soln, Inhalation) Active. Medications Reconciled  Social History Ruth Robertson, Ruth Robertson; 12/21/2018 11:44 AM) Alcohol use Remotely quit alcohol use. Caffeine use Carbonated beverages, Coffee, Tea. No drug use Tobacco use Current every day smoker.  Family History Ruth Robertson, Ruth Robertson; 12/21/2018 11:44 AM) Alcohol Abuse Family Members In Ruth Robertson. Arthritis Father. Colon Polyps Family Members In Ruth Robertson Father, Mother. Heart disease in female family member before age 81 Hypertension Father,  Mother.  Pregnancy / Birth History Ruth Robertson, Ruth Robertson; 12/21/2018 11:44 AM) Age at menarche 57 years. Age of menopause <45 Gravida 1 Maternal age 31-25 Para 1  Other Problems Ruth Robertson, Ruth Robertson; 12/21/2018 11:44 AM) Anxiety Disorder Arthritis Asthma Back Pain Bladder Problems Ruth Robertson     Review of Systems Ruth Robertson [name]; 12/21/2018 11:44 AM) General Present- Weight Gain. Not Present- Appetite Loss, Chills, Fatigue, Fever, Night Sweats and Weight Loss. Skin Not Present- Change in Wart/Mole, Dryness, Hives, Jaundice, New Lesions, Non-Healing Wounds, Rash and Ulcer. HEENT Not Present- Earache, Hearing Loss, Hoarseness, Nose Bleed, Oral Ulcers, Ringing in the Ears, Seasonal Allergies, Sinus Pain, Sore Throat, Visual Disturbances, Wears glasses/contact lenses and Yellow Eyes. Breast Not Present- Breast Mass, Breast Pain, Nipple Discharge and Skin Changes. Cardiovascular Not Present- Chest Pain, Difficulty Breathing Lying Down, Leg Cramps, Palpitations, Rapid Heart Rate, Shortness of Breath and Swelling of Extremities. Gastrointestinal Not Present- Abdominal Pain, Bloating, Bloody Stool, Change in Bowel Habits, Chronic diarrhea, Constipation, Difficulty Swallowing, Excessive gas, Gets full quickly at meals, Hemorrhoids, Indigestion, Nausea, Rectal Pain and Vomiting. Female Genitourinary Present- Nocturia. Not Present- Frequency, Painful Urination, Pelvic Pain and Urgency. Musculoskeletal Present- Back Pain, Joint Pain and Joint Stiffness. Not Present- Muscle Pain, Muscle Weakness and Swelling of Extremities. Neurological Not Present- Decreased Memory, Fainting, Headaches, Numbness, Seizures, Tingling, Tremor, Trouble walking and Weakness. Psychiatric Present- Anxiety. Not Present- Bipolar, Change in Sleep Pattern, Depression, Fearful and Frequent crying. Endocrine Not Present- Cold Intolerance, Excessive Hunger, Hair Changes, Heat Intolerance, Hot flashes and New  Diabetes. Hematology Not Present- Blood Thinners, Easy Bruising, Excessive bleeding, Gland problems, HIV and Persistent Infections.  Vitals Ruth Robertson [name]; 12/21/2018 11:47 AM) 12/21/2018 11:46 AM Weight: 172.8 lb Height: 52in Body Surface Area: 1.58 m Body Mass Index: 44.93 kg/m  Temp.:  97.57F  Pulse: 95 (Regular)  BP: 120/118 (Sitting, Left Arm, Standard)        Physical Exam (Ruth Dowty A. Ruth Tendler MD; 12/21/2018 12:01 PM)  Integumentary Note: Right groin 2 cm x 2 cm chronically inflamed epidermal inclusion cyst at the junction of the right inguinal canal and right thigh. This is subcutaneous.  Head and Neck Head-normocephalic, atraumatic with no lesions or palpable masses.  Eye Eyeball - Bilateral-Extraocular movements intact. Sclera/Conjunctiva - Bilateral-No scleral icterus.  Chest and Lung Exam Note: Work of breathing normal no wheezing  Cardiovascular Note: NSR  Neurologic Neurologic evaluation reveals -alert and oriented x 3 with no impairment of recent or remote memory. Mental Status-Normal.  Musculoskeletal Normal Exam - Left-Upper Extremity Strength Normal and Lower Extremity Strength Normal. Normal Exam - Right-Upper Extremity Strength Normal, Lower Extremity Weakness.    Assessment & Plan (Ruth Blevens A. Ruth Hostetler MD; 12/21/2018 12:03 PM)  SEBACEOUS CYST (L72.3) Impression: right groin cyst  excision of 2 c x 2 cm right groin SEBACEOUS cyst chronically inflamed  Current Plans Pt Education - [employer] Free Text Education/Instructions: discussed with patient and provided information. The pathophysiology of skin & subcutaneous masses was discussed. Natural history risks without surgery were discussed. I recommended surgery to remove the mass. I explained the technique of removal with use of local anesthesia & possible need for more aggressive sedation/anesthesia for patient comfort.  Risks such as bleeding, infection, wound  breakdown, heart attack, death, and other risks were discussed. I noted a good likelihood this will help address the problem. Possibility that this will not correct all symptoms was explained. Possibility of regrowth/recurrence of the mass was discussed. We will work to minimize complications. Questions were answered. The patient expresses understanding & wishes to proceed with surgery.

## 2018-12-21 NOTE — H&P (Signed)
H & P Notes Ruth Robertson Documented: 12/21/2018 11:36 AM Location: [facility] Patient #: [id number] DOB: 09/11/1965 Divorced / Language: [name] / Race: White Female  History of Present Illness Ruth Moores A. Javone Ybanez MD; 12/21/2018 12:00 PM) Patient words: Patient sent request of Dr. Phineas Real for chronic right groin cyst. This is been present for a number of months and will not resolve. It will swell up, drained and improved. Location is at the crease of her right groin between her right thigh and right inguinal region. It is been chronic and causes chronic pain and soreness. Currently, she has no fever, chills, redness or drainage.  The patient is a 53 year old female.   Past Surgical History [name], [place]; 12/21/2018 11:44 AM) Colon Polyp Removal - Colonoscopy Hysterectomy (not due to cancer) - Complete Oral Surgery  Diagnostic Studies History [name], [place]; 12/21/2018 11:44 AM) Colonoscopy 1-5 years ago Mammogram within last year Pap Smear 1-5 years ago  Allergies [name], [name]; 12/21/2018 11:45 AM) No Known Drug Allergies [12/21/2018]: Allergies Reconciled  Medication History [name], [name]; 12/21/2018 11:46 AM) buPROPion HCl ER (XL) (150MG  Tablet ER 24HR, Oral) Active. Omeprazole (20MG  Capsule DR, Oral) Active. Proventil (90MCG/ACT Aerosol Soln, Inhalation) Active. Albuterol Sulfate (0.63MG /3ML Nebulized Soln, Inhalation) Active. Medications Reconciled  Social History [name], [name]; 12/21/2018 11:44 AM) Alcohol use Remotely quit alcohol use. Caffeine use Carbonated beverages, Coffee, Tea. No drug use Tobacco use Current every day smoker.  Family History [name], [name]; 12/21/2018 11:44 AM) Alcohol Abuse Family Members In [place], Son. Arthritis Father. Colon Polyps Family Members In [place] Father, Mother. Heart disease in female family member before age 41 Hypertension Father,  Mother.  Pregnancy / Birth History [name], [place]; 12/21/2018 11:44 AM) Age at menarche 57 years. Age of menopause <45 Gravida 1 Maternal age 46-25 Para 1  Other Problems [name], [place]; 12/21/2018 11:44 AM) Anxiety Disorder Arthritis Asthma Back Pain Bladder Problems Oophorectomy     Review of Systems [name] [name]; 12/21/2018 11:44 AM) General Present- Weight Gain. Not Present- Appetite Loss, Chills, Fatigue, Fever, Night Sweats and Weight Loss. Skin Not Present- Change in Wart/Mole, Dryness, Hives, Jaundice, New Lesions, Non-Healing Wounds, Rash and Ulcer. HEENT Not Present- Earache, Hearing Loss, Hoarseness, Nose Bleed, Oral Ulcers, Ringing in the Ears, Seasonal Allergies, Sinus Pain, Sore Throat, Visual Disturbances, Wears glasses/contact lenses and Yellow Eyes. Breast Not Present- Breast Mass, Breast Pain, Nipple Discharge and Skin Changes. Cardiovascular Not Present- Chest Pain, Difficulty Breathing Lying Down, Leg Cramps, Palpitations, Rapid Heart Rate, Shortness of Breath and Swelling of Extremities. Gastrointestinal Not Present- Abdominal Pain, Bloating, Bloody Stool, Change in Bowel Habits, Chronic diarrhea, Constipation, Difficulty Swallowing, Excessive gas, Gets full quickly at meals, Hemorrhoids, Indigestion, Nausea, Rectal Pain and Vomiting. Female Genitourinary Present- Nocturia. Not Present- Frequency, Painful Urination, Pelvic Pain and Urgency. Musculoskeletal Present- Back Pain, Joint Pain and Joint Stiffness. Not Present- Muscle Pain, Muscle Weakness and Swelling of Extremities. Neurological Not Present- Decreased Memory, Fainting, Headaches, Numbness, Seizures, Tingling, Tremor, Trouble walking and Weakness. Psychiatric Present- Anxiety. Not Present- Bipolar, Change in Sleep Pattern, Depression, Fearful and Frequent crying. Endocrine Not Present- Cold Intolerance, Excessive Hunger, Hair Changes, Heat Intolerance, Hot flashes and New  Diabetes. Hematology Not Present- Blood Thinners, Easy Bruising, Excessive bleeding, Gland problems, HIV and Persistent Infections.  Vitals [name] [name]; 12/21/2018 11:47 AM) 12/21/2018 11:46 AM Weight: 172.8 lb Height: 52in Body Surface Area: 1.58 m Body Mass Index: 44.93 kg/m  Temp.:  97.36F  Pulse: 95 (Regular)  BP: 120/118 (Sitting, Left Arm, Standard)        Physical Exam (Zamora Colton A. Suman Trivedi MD; 12/21/2018 12:01 PM)  Integumentary Note: Right groin 2 cm x 2 cm chronically inflamed epidermal inclusion cyst at the junction of the right inguinal canal and right thigh. This is subcutaneous.  Head and Neck Head-normocephalic, atraumatic with no lesions or palpable masses.  Eye Eyeball - Bilateral-Extraocular movements intact. Sclera/Conjunctiva - Bilateral-No scleral icterus.  Chest and Lung Exam Note: Work of breathing normal no wheezing  Cardiovascular Note: NSR  Neurologic Neurologic evaluation reveals -alert and oriented x 3 with no impairment of recent or remote memory. Mental Status-Normal.  Musculoskeletal Normal Exam - Left-Upper Extremity Strength Normal and Lower Extremity Strength Normal. Normal Exam - Right-Upper Extremity Strength Normal, Lower Extremity Weakness.    Assessment & Plan (Alicyn Klann A. Candy Ziegler MD; 12/21/2018 12:03 PM)  SEBACEOUS CYST (L72.3) Impression: right groin cyst  excision of 2 c x 2 cm right groin SEBACEOUS cyst chronically inflamed  Current Plans Pt Education - [employer] Free Text Education/Instructions: discussed with patient and provided information. The pathophysiology of skin & subcutaneous masses was discussed. Natural history risks without surgery were discussed. I recommended surgery to remove the mass. I explained the technique of removal with use of local anesthesia & possible need for more aggressive sedation/anesthesia for patient comfort.  Risks such as bleeding, infection, wound  breakdown, heart attack, death, and other risks were discussed. I noted a good likelihood this will help address the problem. Possibility that this will not correct all symptoms was explained. Possibility of regrowth/recurrence of the mass was discussed. We will work to minimize complications. Questions were answered. The patient expresses understanding & wishes to proceed with surgery.

## 2018-12-29 DIAGNOSIS — H25813 Combined forms of age-related cataract, bilateral: Secondary | ICD-10-CM | POA: Diagnosis not present

## 2018-12-29 DIAGNOSIS — H524 Presbyopia: Secondary | ICD-10-CM | POA: Diagnosis not present

## 2019-01-13 ENCOUNTER — Other Ambulatory Visit: Payer: Self-pay

## 2019-01-13 ENCOUNTER — Encounter (HOSPITAL_BASED_OUTPATIENT_CLINIC_OR_DEPARTMENT_OTHER): Payer: Self-pay

## 2019-01-14 NOTE — Progress Notes (Signed)

## 2019-01-16 ENCOUNTER — Other Ambulatory Visit (HOSPITAL_COMMUNITY)
Admission: RE | Admit: 2019-01-16 | Discharge: 2019-01-16 | Disposition: A | Discharge status: Home / Self Care | Payer: 59 | Source: Ambulatory Visit | Attending: Surgery | Admitting: Surgery

## 2019-01-16 DIAGNOSIS — Z01812 Encounter for preprocedural laboratory examination: Secondary | ICD-10-CM | POA: Insufficient documentation

## 2019-01-16 DIAGNOSIS — Z20828 Contact with and (suspected) exposure to other viral communicable diseases: Secondary | ICD-10-CM | POA: Diagnosis not present

## 2019-01-17 LAB — NOVEL CORONAVIRUS, NAA (HOSP ORDER, SEND-OUT TO REF LAB; TAT 18-24 HRS): SARS-CoV-2, NAA: NOT DETECTED

## 2019-01-20 ENCOUNTER — Encounter (HOSPITAL_BASED_OUTPATIENT_CLINIC_OR_DEPARTMENT_OTHER): Payer: Self-pay | Admitting: Surgery

## 2019-01-20 ENCOUNTER — Ambulatory Visit (HOSPITAL_BASED_OUTPATIENT_CLINIC_OR_DEPARTMENT_OTHER)
Admission: RE | Admit: 2019-01-20 | Discharge: 2019-01-20 | Disposition: A | Discharge status: Home / Self Care | Payer: 59 | Attending: Surgery | Admitting: Surgery

## 2019-01-20 ENCOUNTER — Encounter (HOSPITAL_BASED_OUTPATIENT_CLINIC_OR_DEPARTMENT_OTHER)
Admission: RE | Disposition: A | Discharge status: Home / Self Care | Payer: Self-pay | Source: Home / Self Care | Attending: Surgery

## 2019-01-20 ENCOUNTER — Ambulatory Visit (HOSPITAL_BASED_OUTPATIENT_CLINIC_OR_DEPARTMENT_OTHER): Payer: 59 | Admitting: Anesthesiology

## 2019-01-20 ENCOUNTER — Other Ambulatory Visit: Payer: Self-pay

## 2019-01-20 DIAGNOSIS — K219 Gastro-esophageal reflux disease without esophagitis: Secondary | ICD-10-CM | POA: Insufficient documentation

## 2019-01-20 DIAGNOSIS — J449 Chronic obstructive pulmonary disease, unspecified: Secondary | ICD-10-CM | POA: Diagnosis not present

## 2019-01-20 DIAGNOSIS — F419 Anxiety disorder, unspecified: Secondary | ICD-10-CM | POA: Insufficient documentation

## 2019-01-20 DIAGNOSIS — F17210 Nicotine dependence, cigarettes, uncomplicated: Secondary | ICD-10-CM | POA: Insufficient documentation

## 2019-01-20 DIAGNOSIS — J45909 Unspecified asthma, uncomplicated: Secondary | ICD-10-CM | POA: Diagnosis not present

## 2019-01-20 DIAGNOSIS — L723 Sebaceous cyst: Secondary | ICD-10-CM | POA: Diagnosis not present

## 2019-01-20 DIAGNOSIS — L720 Epidermal cyst: Secondary | ICD-10-CM | POA: Insufficient documentation

## 2019-01-20 DIAGNOSIS — F418 Other specified anxiety disorders: Secondary | ICD-10-CM | POA: Diagnosis not present

## 2019-01-20 DIAGNOSIS — Z79899 Other long term (current) drug therapy: Secondary | ICD-10-CM | POA: Diagnosis not present

## 2019-01-20 DIAGNOSIS — K56600 Partial intestinal obstruction, unspecified as to cause: Secondary | ICD-10-CM | POA: Diagnosis not present

## 2019-01-20 DIAGNOSIS — L821 Other seborrheic keratosis: Secondary | ICD-10-CM | POA: Diagnosis not present

## 2019-01-20 HISTORY — PX: CYST EXCISION: SHX5701

## 2019-01-20 HISTORY — DX: Gastro-esophageal reflux disease without esophagitis: K21.9

## 2019-01-20 SURGERY — CYST REMOVAL
Anesthesia: General | Site: Groin

## 2019-01-20 MED ORDER — LIDOCAINE 2% (20 MG/ML) 5 ML SYRINGE
INTRAMUSCULAR | Status: AC
  Filled 2019-01-20: qty 5

## 2019-01-20 MED ORDER — MIDAZOLAM HCL 2 MG/2ML IJ SOLN: 0.5000 mg | Freq: Once | INTRAMUSCULAR | Status: DC | PRN

## 2019-01-20 MED ORDER — FENTANYL CITRATE (PF) 100 MCG/2ML IJ SOLN
INTRAMUSCULAR | Status: DC | PRN
  Administered 2019-01-20: 50 ug via INTRAVENOUS

## 2019-01-20 MED ORDER — ONDANSETRON HCL 4 MG/2ML IJ SOLN
INTRAMUSCULAR | Status: AC
  Filled 2019-01-20: qty 2

## 2019-01-20 MED ORDER — BUPIVACAINE HCL (PF) 0.25 % IJ SOLN
INTRAMUSCULAR | Status: AC
  Filled 2019-01-20: qty 30

## 2019-01-20 MED ORDER — DEXAMETHASONE SODIUM PHOSPHATE 4 MG/ML IJ SOLN
INTRAMUSCULAR | Status: DC | PRN
  Administered 2019-01-20: 10 mg via INTRAVENOUS

## 2019-01-20 MED ORDER — FENTANYL CITRATE (PF) 100 MCG/2ML IJ SOLN: 25.0000 ug | INTRAMUSCULAR | Status: DC | PRN

## 2019-01-20 MED ORDER — SCOPOLAMINE 1 MG/3DAYS TD PT72
1.0000 | MEDICATED_PATCH | Freq: Once | TRANSDERMAL | Status: DC
  Administered 2019-01-20: 1.5 mg via TRANSDERMAL

## 2019-01-20 MED ORDER — CHLORHEXIDINE GLUCONATE CLOTH 2 % EX PADS: 6.0000 | MEDICATED_PAD | Freq: Once | CUTANEOUS | Status: DC

## 2019-01-20 MED ORDER — DEXAMETHASONE SODIUM PHOSPHATE 10 MG/ML IJ SOLN
INTRAMUSCULAR | Status: AC
  Filled 2019-01-20: qty 1

## 2019-01-20 MED ORDER — LIDOCAINE 2% (20 MG/ML) 5 ML SYRINGE
INTRAMUSCULAR | Status: DC | PRN
  Administered 2019-01-20: 60 mg via INTRAVENOUS

## 2019-01-20 MED ORDER — ONDANSETRON HCL 4 MG/2ML IJ SOLN
INTRAMUSCULAR | Status: DC | PRN
  Administered 2019-01-20: 4 mg via INTRAVENOUS

## 2019-01-20 MED ORDER — LACTATED RINGERS IV SOLN: INTRAVENOUS | Status: DC

## 2019-01-20 MED ORDER — PROPOFOL 10 MG/ML IV BOLUS
INTRAVENOUS | Status: DC | PRN
  Administered 2019-01-20: 150 mg via INTRAVENOUS

## 2019-01-20 MED ORDER — OXYCODONE HCL 5 MG/5ML PO SOLN: 5.0000 mg | Freq: Once | ORAL | Status: DC | PRN

## 2019-01-20 MED ORDER — MIDAZOLAM HCL 2 MG/2ML IJ SOLN
INTRAMUSCULAR | Status: AC
  Filled 2019-01-20: qty 2

## 2019-01-20 MED ORDER — FENTANYL CITRATE (PF) 100 MCG/2ML IJ SOLN
INTRAMUSCULAR | Status: AC
  Filled 2019-01-20: qty 2

## 2019-01-20 MED ORDER — HYDROCODONE-ACETAMINOPHEN 5-325 MG PO TABS: 1.0000 | ORAL_TABLET | Freq: Four times a day (QID) | ORAL | 0 refills | Status: DC | PRN

## 2019-01-20 MED ORDER — MEPERIDINE HCL 25 MG/ML IJ SOLN: 6.2500 mg | INTRAMUSCULAR | Status: DC | PRN

## 2019-01-20 MED ORDER — BUPIVACAINE-EPINEPHRINE 0.25% -1:200000 IJ SOLN: INTRAMUSCULAR | Status: DC | PRN

## 2019-01-20 MED ORDER — MIDAZOLAM HCL 5 MG/5ML IJ SOLN
INTRAMUSCULAR | Status: DC | PRN
  Administered 2019-01-20: 2 mg via INTRAVENOUS

## 2019-01-20 MED ORDER — CEFAZOLIN SODIUM-DEXTROSE 2-4 GM/100ML-% IV SOLN
INTRAVENOUS | Status: AC
  Filled 2019-01-20: qty 100

## 2019-01-20 MED ORDER — SCOPOLAMINE 1 MG/3DAYS TD PT72
MEDICATED_PATCH | TRANSDERMAL | Status: AC
  Filled 2019-01-20: qty 1

## 2019-01-20 MED ORDER — OXYCODONE HCL 5 MG PO TABS: 5.0000 mg | ORAL_TABLET | Freq: Once | ORAL | Status: DC | PRN

## 2019-01-20 MED ORDER — PROMETHAZINE HCL 25 MG/ML IJ SOLN: 6.2500 mg | INTRAMUSCULAR | Status: DC | PRN

## 2019-01-20 MED ORDER — BUPIVACAINE HCL (PF) 0.25 % IJ SOLN
INTRAMUSCULAR | Status: DC | PRN
  Administered 2019-01-20: 13 mL

## 2019-01-20 MED ORDER — CEFAZOLIN SODIUM-DEXTROSE 2-4 GM/100ML-% IV SOLN
2.0000 g | INTRAVENOUS | Status: AC
  Administered 2019-01-20: 10:00:00 2 g via INTRAVENOUS

## 2019-01-20 SURGICAL SUPPLY — 39 items
APL PRP STRL LF DISP 70% ISPRP (MISCELLANEOUS)
APL SKNCLS STERI-STRIP NONHPOA (GAUZE/BANDAGES/DRESSINGS)
BENZOIN TINCTURE PRP APPL 2/3 (GAUZE/BANDAGES/DRESSINGS) IMPLANT
BLADE SURG 10 STRL SS (BLADE) ×2 IMPLANT
BLADE SURG 15 STRL LF DISP TIS (BLADE) ×1 IMPLANT
BLADE SURG 15 STRL SS (BLADE) ×2
CANISTER SUCT 1200ML W/VALVE (MISCELLANEOUS) ×1 IMPLANT
CHLORAPREP W/TINT 26 (MISCELLANEOUS) ×1 IMPLANT
COVER BACK TABLE REUSABLE LG (DRAPES) ×2 IMPLANT
COVER MAYO STAND REUSABLE (DRAPES) ×2 IMPLANT
COVER WAND RF STERILE (DRAPES) IMPLANT
DECANTER SPIKE VIAL GLASS SM (MISCELLANEOUS) IMPLANT
DRAPE LAPAROTOMY 100X72 PEDS (DRAPES) ×2 IMPLANT
DRAPE UTILITY XL STRL (DRAPES) ×2 IMPLANT
ELECT COATED BLADE 2.86 ST (ELECTRODE) ×2 IMPLANT
ELECT REM PT RETURN 9FT ADLT (ELECTROSURGICAL) ×2
ELECTRODE REM PT RTRN 9FT ADLT (ELECTROSURGICAL) ×1 IMPLANT
GLOVE BIO SURGEON STRL SZ 6.5 (GLOVE) ×2 IMPLANT
GLOVE BIOGEL PI IND STRL 7.0 (GLOVE) IMPLANT
GLOVE BIOGEL PI IND STRL 8 (GLOVE) ×1 IMPLANT
GLOVE BIOGEL PI INDICATOR 7.0 (GLOVE) ×1
GLOVE BIOGEL PI INDICATOR 8 (GLOVE) ×1
GLOVE ECLIPSE 8.0 STRL XLNG CF (GLOVE) ×2 IMPLANT
GOWN STRL REUS W/ TWL LRG LVL3 (GOWN DISPOSABLE) ×2 IMPLANT
GOWN STRL REUS W/TWL LRG LVL3 (GOWN DISPOSABLE) ×4
NDL HYPO 25X1 1.5 SAFETY (NEEDLE) ×1 IMPLANT
NEEDLE HYPO 25X1 1.5 SAFETY (NEEDLE) ×2 IMPLANT
NS IRRIG 1000ML POUR BTL (IV SOLUTION) ×1 IMPLANT
PACK BASIN DAY SURGERY FS (CUSTOM PROCEDURE TRAY) ×2 IMPLANT
PENCIL SMOKE EVACUATOR (MISCELLANEOUS) ×2 IMPLANT
SPONGE LAP 4X18 RFD (DISPOSABLE) ×2 IMPLANT
STRIP CLOSURE SKIN 1/2X4 (GAUZE/BANDAGES/DRESSINGS) IMPLANT
SUT MON AB 4-0 PC3 18 (SUTURE) ×2 IMPLANT
SUT VICRYL 3-0 CR8 SH (SUTURE) ×2 IMPLANT
SUT VICRYL AB 3 0 TIES (SUTURE) IMPLANT
SYR CONTROL 10ML LL (SYRINGE) ×2 IMPLANT
TOWEL GREEN STERILE FF (TOWEL DISPOSABLE) ×4 IMPLANT
TUBE CONNECTING 20X1/4 (TUBING) ×1 IMPLANT
YANKAUER SUCT BULB TIP NO VENT (SUCTIONS) ×1 IMPLANT

## 2019-01-20 NOTE — Transfer of Care (Signed)
Immediate Anesthesia Transfer of Care Note  Patient: Ruth Robertson  Procedure(s) Performed: EXCISION OF RIGHT GROIN SEBACEOUS CYST (N/A Groin)  Patient Location: PACU  Anesthesia Type:General  Level of Consciousness: drowsy  Airway & Oxygen Therapy: Patient Spontanous Breathing and Patient connected to face mask oxygen  Post-op Assessment: Report given to RN and Post -op Vital signs reviewed and stable  Post vital signs: Reviewed and stable  Last Vitals:  Vitals Value Taken Time  BP    Temp    Pulse 74 01/20/19 1035  Resp 17 01/20/19 1035  SpO2 100 % 01/20/19 1035    Last Pain:  Vitals:   01/20/19 0830  TempSrc: Tympanic  PainSc: 0-No pain      Patients Stated Pain Goal: 7 ([id number])  Complications: No apparent anesthesia complications

## 2019-01-20 NOTE — Interval H&P Note (Signed)
History and Physical Interval Note:  01/20/2019 9:13 AM  Ruth Robertson  has presented today for surgery, with the diagnosis of RIGHT GROIN SEBACEOUS CYST.  The various methods of treatment have been discussed with the patient and family. After consideration of risks, benefits and other options for treatment, the patient has consented to  Procedure(s): EXCISION OF RIGHT GROIN SEBACEOUS CYST (N/A) as a surgical intervention.  The patient's history has been reviewed, patient examined, no change in status, stable for surgery.  I have reviewed the patient's chart and labs.  Questions were answered to the patient's satisfaction.     [place]

## 2019-01-20 NOTE — Discharge Instructions (Signed)

## 2019-01-20 NOTE — Anesthesia Preprocedure Evaluation (Addendum)
Anesthesia Evaluation  Patient identified by MRN, date of birth, ID band Patient awake    Reviewed: Allergy & Precautions, NPO status , Patient's Chart, lab work & pertinent test results  History of Anesthesia Complications (+) PONV  Airway Mallampati: II  TM Distance: >3 FB Neck ROM: Full    Dental  (+) Dental Advisory Given   Pulmonary COPD,  COPD inhaler, Current Smoker and Patient abstained from smoking.,  01/16/2019 SARS CoV2 NEG   breath sounds clear to auscultation       Cardiovascular negative cardio ROS   Rhythm:Regular Rate:Normal     Neuro/Psych negative neurological ROS     GI/Hepatic Neg liver ROS, GERD  Medicated and Controlled,  Endo/Other  negative endocrine ROS  Renal/GU negative Renal ROS     Musculoskeletal   Abdominal   Peds  Hematology negative hematology ROS (+)   Anesthesia Other Findings   Reproductive/Obstetrics S/p salping-oophorectomy                            Anesthesia Physical Anesthesia Plan  ASA: II  Anesthesia Plan: General   Post-op Pain Management:    Induction:   PONV Risk Score and Plan: 3 and Ondansetron, Dexamethasone and Scopolamine patch - Pre-op  Airway Management Planned: LMA  Additional Equipment:   Intra-op Plan:   Post-operative Plan:   Informed Consent: I have reviewed the patients History and Physical, chart, labs and discussed the procedure including the risks, benefits and alternatives for the proposed anesthesia with the patient or authorized representative who has indicated his/her understanding and acceptance.     Dental advisory given  Plan Discussed with: CRNA and Surgeon  Anesthesia Plan Comments:         Anesthesia Quick Evaluation

## 2019-01-20 NOTE — Anesthesia Postprocedure Evaluation (Signed)
Anesthesia Post Note  Patient: Ruth Robertson  Procedure(s) Performed: EXCISION OF RIGHT GROIN SEBACEOUS CYST (N/A Groin)     Patient location during evaluation: PACU Anesthesia Type: General Level of consciousness: awake and alert, patient cooperative and oriented Pain management: pain level controlled Vital Signs Assessment: post-procedure vital signs reviewed and stable Respiratory status: spontaneous breathing, nonlabored ventilation and respiratory function stable Cardiovascular status: blood pressure returned to baseline and stable Postop Assessment: no apparent nausea or vomiting Anesthetic complications: no    Last Vitals:  Vitals:   01/20/19 1142 01/20/19 1200  BP: 106/70 125/85  Pulse: 76 71  Resp: 12 18  Temp:  36.5 C  SpO2: 95% 96%    Last Pain:  Vitals:   01/20/19 1200  TempSrc:   PainSc: 0-No pain                 Troi Florendo,E. Cari Vandeberg

## 2019-01-20 NOTE — Op Note (Signed)
Preoperative diagnosis: 2 cm x 2 cm right groin epidermal inclusion cyst  Postoperative diagnosis: 3 cm x 4 cm right groin epidermal inclusion cyst  Procedure: Excision of right groin epidermal inclusion cyst  Surgeon: Erroll Luna, MD  Esthesia: LMA with local  EBL: Minimal  Specimen: Right groin sebaceous cyst remnant to pathology  IV fluids: Per anesthesia record  Indications for procedure: The patient presents for excision of a chronically draining, painful and symptomatic sebaceous cyst in her right groin region at the junction of her anal canal and right thigh.  It was 2 cm on clinical examination.  She is had problems with infection but currently no active infection is noted.  She presents today for excision for control of her symptoms and to prevent recurrence.The procedure has been discussed with the patient.  Alternative therapies have been discussed with the patient.  Operative risks include bleeding,  Infection,  Organ injury,  Nerve injury,  Blood vessel injury,  DVT,  Pulmonary embolism,  Death,  And possible reoperation.  Medical management risks include worsening of present situation.  The success of the procedure is 50 -90 % at treating patients symptoms.  The patient understands and agrees to proceed.  Description of procedure: The patient was met in the holding area.  The cyst was marked with the help of the patient and her right groin and we both agreed with this was the proper spot.  This was in her right groin.  She was then taken back to the operating.  She is placed supine upon the OR table.  After induction of general esthesia the right groin region was prepped and draped in a sterile fashion.  Timeout was performed and proper patient, procedure and site were verified.  Local anesthetic was infiltrated the right inguinal crease.  The cyst was excised with grossly negative margins.  This consisted of the skin and subcutaneous tissue.  Wound was irrigated and made  hemostatic.  Was closed with 3-0 Vicryl and 4-0 Monocryl.  Dermabond was applied.  All counts were found to be correct.  The patient was awoke extubated taken recovery in satisfactory condition.

## 2019-01-20 NOTE — Anesthesia Procedure Notes (Signed)
Procedure Name: LMA Insertion Date/Time: 01/20/2019 9:54 AM Performed by: Lieutenant Diego, CRNA Pre-anesthesia Checklist: Patient identified, Emergency Drugs available, Suction available and Patient being monitored Patient Re-evaluated:Patient Re-evaluated prior to induction Oxygen Delivery Method: Circle system utilized Preoxygenation: Pre-oxygenation with 100% oxygen Induction Type: IV induction Ventilation: Mask ventilation without difficulty LMA: LMA inserted LMA Size: 4.0 Number of attempts: 1 Placement Confirmation: positive ETCO2 and breath sounds checked- equal and bilateral Tube secured with: Tape Dental Injury: Teeth and Oropharynx as per pre-operative assessment

## 2019-01-21 ENCOUNTER — Encounter: Payer: Self-pay | Admitting: *Deleted

## 2019-01-21 LAB — SURGICAL PATHOLOGY

## 2019-05-18 IMAGING — MG DIGITAL SCREENING BILATERAL MAMMOGRAM WITH CAD
4 series · 4 of 4 positions shown · non-contrast
Comparison: Previous exam(s).

CLINICAL DATA: Screening.

EXAM:
DIGITAL SCREENING BILATERAL MAMMOGRAM WITH CAD

[L CC]
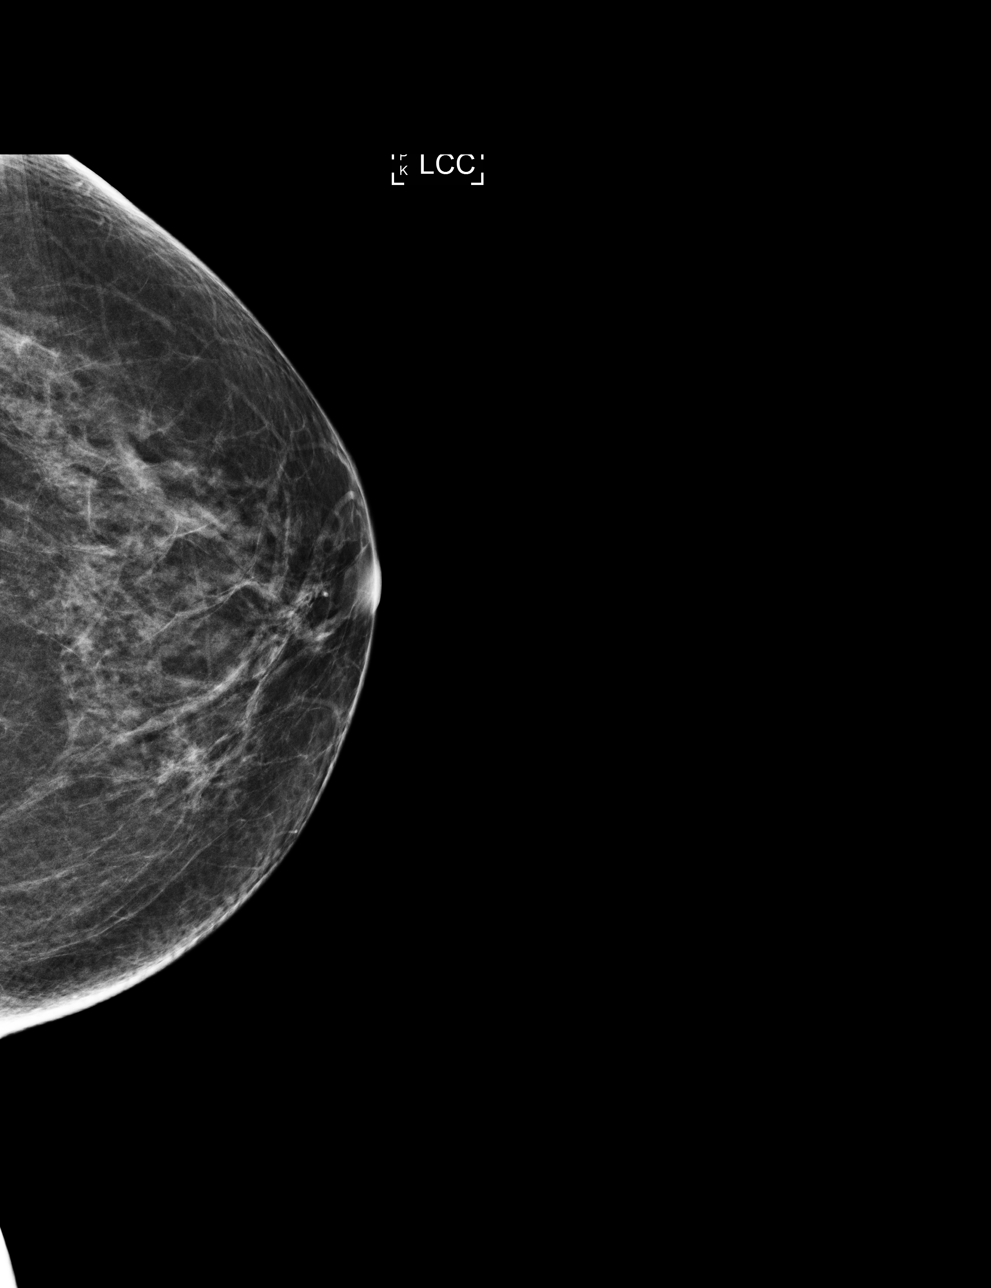

[L MLO]
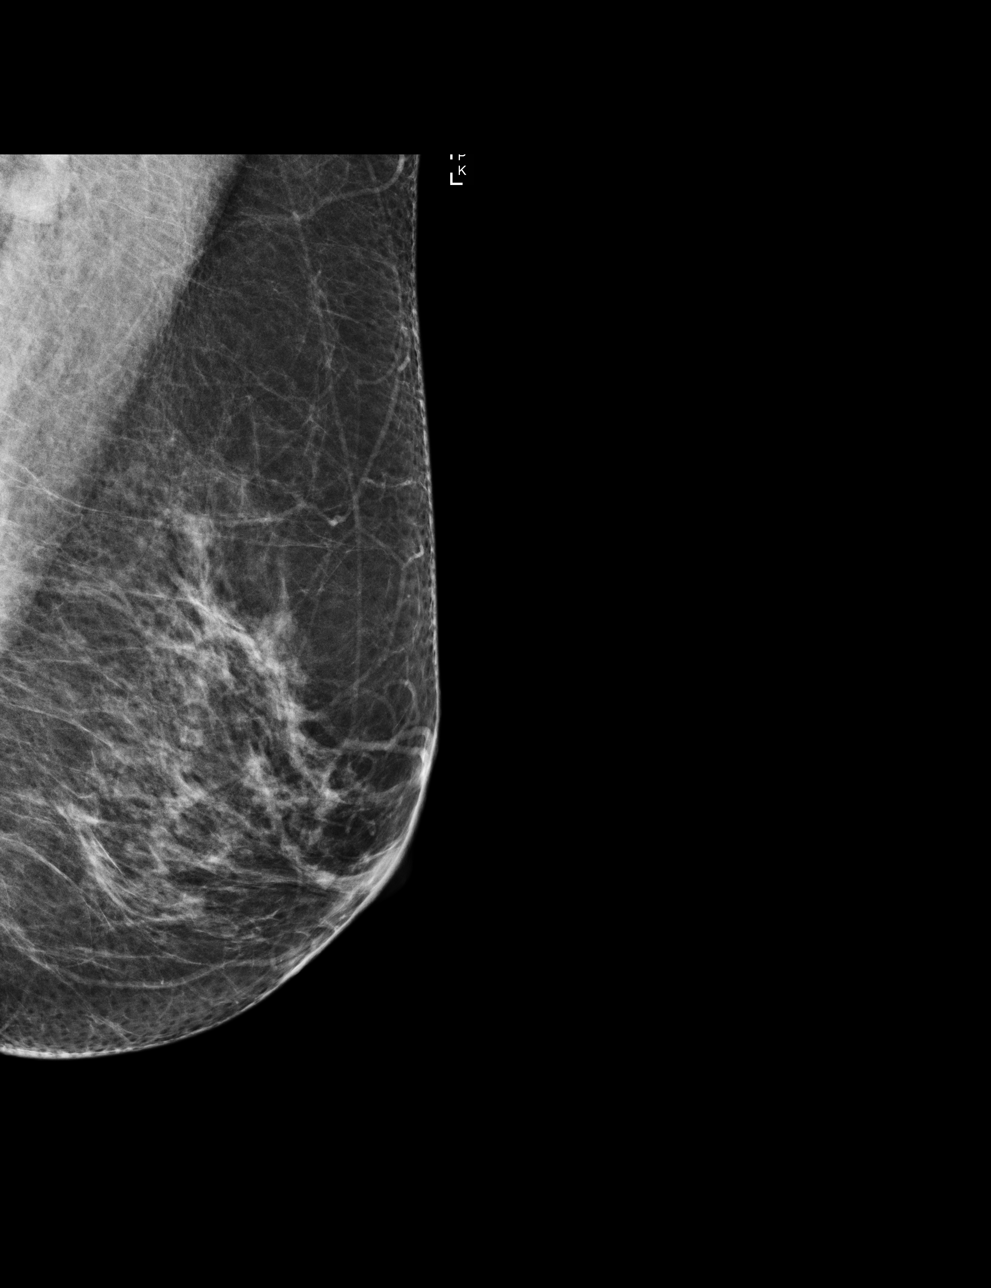

[R MLO]
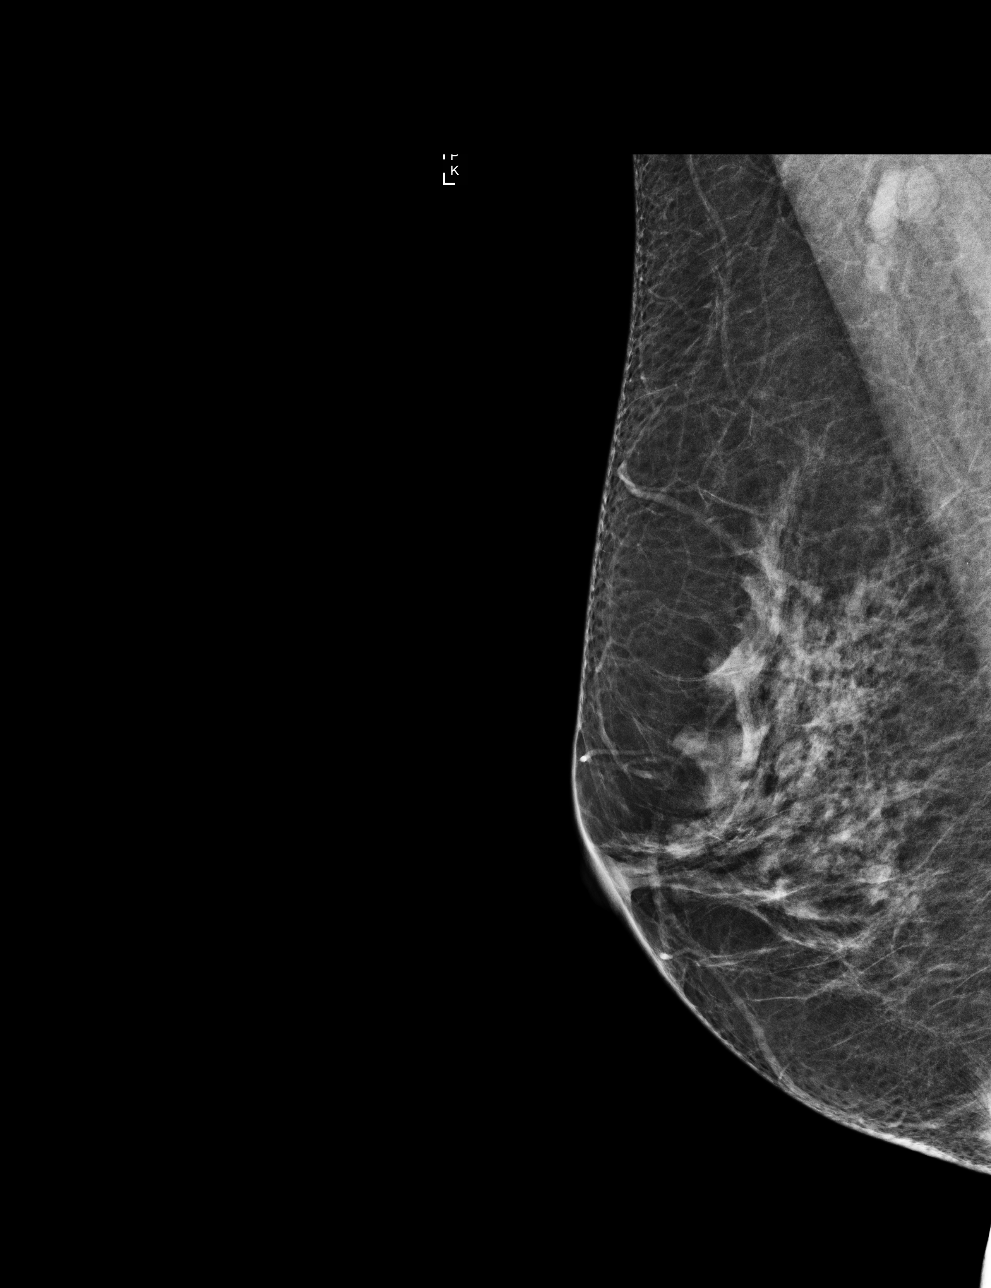

[R CC]
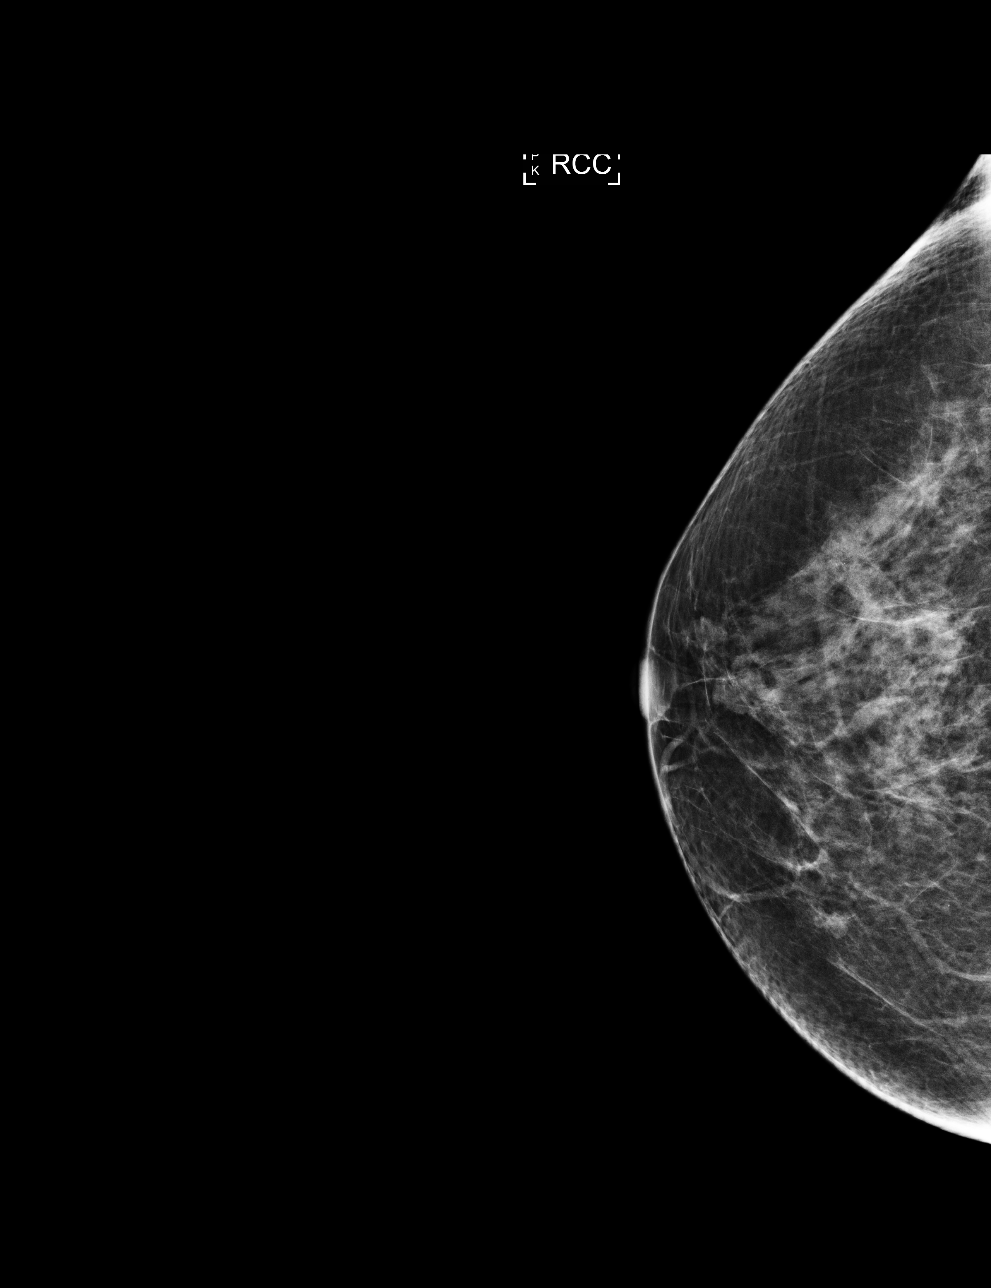

[4 of 4 positions shown; findings below may reference images not displayed]

ACR Breast Density Category b: There are scattered areas of
fibroglandular density.
FINDINGS: There are no findings suspicious for malignancy. Images were
processed with CAD.
IMPRESSION: No mammographic evidence of malignancy. A result letter of this
screening mammogram will be mailed directly to the patient.

RECOMMENDATION:
Screening mammogram in one year. (Code:AS-G-LCT)

BI-RADS CATEGORY  1: Negative.

## 2019-05-24 ENCOUNTER — Telehealth: Payer: Self-pay | Admitting: Family Medicine

## 2019-05-24 NOTE — Telephone Encounter (Signed)
1.  Wear a mask as a precaution when around her son #2 I do not think she has to be quarantined because she has had the vaccine #3 she does need to inform health at work regarding the situation and will have to follow their protocols #4 the likelihood that she will get sick with the Covid virus is low but possible and if she starts experiencing flulike symptoms she should get tested appropriately  Call us if any issues

## 2019-05-24 NOTE — Telephone Encounter (Signed)
Pt contacted and verbalized understanding.  

## 2019-05-24 NOTE — Telephone Encounter (Signed)
Pt calling in to get advice on what to do. Pt went to [place] this weekend to visit boyfriend family. Pt states that last night she received information that pt boyfriend brother was COVID positive. Pt stayed in home with brother that tested positive. Pt is not having any symptoms at this time. Pt has been fully vaccinated (pt is not sure of last COVID vaccine). Boyfriend is not vaccinated. Boyfriend does not live in home with patient. Pt is wanting to know what precautions she needs to take. Does she quarantine? Pt son lives at home with her and has not been vaccinated either. Please advise. Thank you  CB# [phone]

## 2019-11-24 ENCOUNTER — Other Ambulatory Visit: Payer: Self-pay | Admitting: Family Medicine

## 2019-11-24 DIAGNOSIS — Z1231 Encounter for screening mammogram for malignant neoplasm of breast: Secondary | ICD-10-CM

## 2019-12-17 ENCOUNTER — Encounter: Payer: 59 | Admitting: Gynecology

## 2019-12-17 ENCOUNTER — Encounter: Payer: 59 | Admitting: Obstetrics and Gynecology

## 2020-01-04 ENCOUNTER — Other Ambulatory Visit: Payer: Self-pay

## 2020-01-04 ENCOUNTER — Ambulatory Visit
Admission: RE | Admit: 2020-01-04 | Discharge: 2020-01-04 | Disposition: A | Discharge status: Home / Self Care | Payer: 59 | Source: Ambulatory Visit | Attending: Family Medicine | Admitting: Family Medicine

## 2020-01-04 DIAGNOSIS — H524 Presbyopia: Secondary | ICD-10-CM | POA: Diagnosis not present

## 2020-01-04 DIAGNOSIS — Z1231 Encounter for screening mammogram for malignant neoplasm of breast: Secondary | ICD-10-CM

## 2020-01-04 DIAGNOSIS — H5203 Hypermetropia, bilateral: Secondary | ICD-10-CM | POA: Diagnosis not present

## 2020-01-04 DIAGNOSIS — H52203 Unspecified astigmatism, bilateral: Secondary | ICD-10-CM | POA: Diagnosis not present

## 2020-01-04 DIAGNOSIS — H25813 Combined forms of age-related cataract, bilateral: Secondary | ICD-10-CM | POA: Diagnosis not present

## 2020-01-19 ENCOUNTER — Ambulatory Visit (INDEPENDENT_AMBULATORY_CARE_PROVIDER_SITE_OTHER): Payer: 59 | Admitting: Family Medicine

## 2020-01-19 ENCOUNTER — Ambulatory Visit (HOSPITAL_COMMUNITY)
Admission: RE | Admit: 2020-01-19 | Discharge: 2020-01-19 | Disposition: A | Discharge status: Home / Self Care | Payer: 59 | Source: Ambulatory Visit | Attending: Family Medicine | Admitting: Family Medicine

## 2020-01-19 ENCOUNTER — Other Ambulatory Visit: Payer: Self-pay

## 2020-01-19 ENCOUNTER — Other Ambulatory Visit: Payer: Self-pay | Admitting: Family Medicine

## 2020-01-19 ENCOUNTER — Encounter: Payer: Self-pay | Admitting: Family Medicine

## 2020-01-19 VITALS — HR 95 | Temp 98.4°F | Wt 175.0 lb

## 2020-01-19 DIAGNOSIS — J90 Pleural effusion, not elsewhere classified: Secondary | ICD-10-CM | POA: Diagnosis not present

## 2020-01-19 DIAGNOSIS — J189 Pneumonia, unspecified organism: Secondary | ICD-10-CM | POA: Insufficient documentation

## 2020-01-19 DIAGNOSIS — R0602 Shortness of breath: Secondary | ICD-10-CM | POA: Diagnosis not present

## 2020-01-19 DIAGNOSIS — R059 Cough, unspecified: Secondary | ICD-10-CM | POA: Diagnosis not present

## 2020-01-19 MED ORDER — AZITHROMYCIN 250 MG PO TABS: ORAL_TABLET | ORAL | 0 refills | Status: DC

## 2020-01-19 MED ORDER — ALBUTEROL SULFATE HFA 108 (90 BASE) MCG/ACT IN AERS: 2.0000 | INHALATION_SPRAY | RESPIRATORY_TRACT | 2 refills | Status: DC | PRN

## 2020-01-19 MED ORDER — BUPROPION HCL ER (XL) 150 MG PO TB24: 150.0000 mg | ORAL_TABLET | Freq: Every day | ORAL | 1 refills | Status: DC

## 2020-01-19 MED FILL — buPROPion HCL ER (XL) 150 M: 150 | 90 days supply | Qty: 90 | Fill #0

## 2020-01-19 NOTE — Progress Notes (Signed)
   Subjective:    Patient ID: Ruth Robertson, female    DOB: 1965-09-09, 54 y.o.   MRN: [id number]  HPI  Patient presents today with respiratory illness Number of days present- 3 weeks  Symptoms include- cough, congestion, face feels numb, sob. Taking sudafed and dayquil  Presence of worrisome signs (severe shortness of breath, lethargy, etc.) - sob  Recent/current visit to urgent care or ER- none Progressive coughing congestion gets short of breath at times denies high fever chills denies wheezing difficulty breathing denies chest pressure tightness or pain Recent direct exposure to Covid- none  Any current Covid testing- none  phq9 done-under a lot of stress related into what is going on with her son  [facility] Visit from 01/19/2020 in [place]  PHQ-9 Total Score 10       Review of Systems Please see above    Objective:   Physical Exam HEENT benign no respiratory distress bronchial sounds as well as early pneumonia noted in the left lower base       Assessment & Plan:  Pneumonia X-ray ordered Antibiotics ordered Continue present measures Chest x-ray ordered stat Warning signs were discussed Albuterol as needed  Stress related issues Patient would prefer to go ahead and start Wellbutrin back we will do a follow-up in 4 to 6 weeks

## 2020-01-20 MED FILL — ALBUTEROL SULFATE HFA 108 (: 108 (90 BAS | 17 days supply | Qty: 18 | Fill #0

## 2020-01-21 LAB — NOVEL CORONAVIRUS, NAA: SARS-CoV-2, NAA: NOT DETECTED

## 2020-01-21 LAB — SARS-COV-2, NAA 2 DAY TAT

## 2020-01-21 LAB — SPECIMEN STATUS REPORT

## 2020-01-26 ENCOUNTER — Encounter: Payer: 59 | Admitting: Obstetrics and Gynecology

## 2020-02-18 ENCOUNTER — Encounter: Payer: Self-pay | Admitting: Obstetrics and Gynecology

## 2020-02-18 ENCOUNTER — Other Ambulatory Visit: Payer: Self-pay

## 2020-02-18 ENCOUNTER — Ambulatory Visit (INDEPENDENT_AMBULATORY_CARE_PROVIDER_SITE_OTHER): Payer: 59 | Admitting: Obstetrics and Gynecology

## 2020-02-18 VITALS — BP 124/80 | Ht 65.0 in | Wt 173.0 lb

## 2020-02-18 DIAGNOSIS — Z01419 Encounter for gynecological examination (general) (routine) without abnormal findings: Secondary | ICD-10-CM

## 2020-02-18 DIAGNOSIS — Z1321 Encounter for screening for nutritional disorder: Secondary | ICD-10-CM

## 2020-02-18 DIAGNOSIS — Z1329 Encounter for screening for other suspected endocrine disorder: Secondary | ICD-10-CM

## 2020-02-18 DIAGNOSIS — Z13220 Encounter for screening for lipoid disorders: Secondary | ICD-10-CM

## 2020-02-18 NOTE — Progress Notes (Signed)
   Ruth Robertson [facility] 02/01/1966 [id number]  SUBJECTIVE:  55 y.o. G1P1001 female for annual routine gynecologic exam. She has no gynecologic concerns.  Current Outpatient Medications  Medication Sig Dispense Refill  . acetaminophen (TYLENOL) 325 MG tablet Take 2 tablets (650 mg total) by mouth every 6 (six) hours as needed for mild pain (or Fever >/= 101).    Marland Kitchen albuterol (VENTOLIN HFA) 108 (90 Base) MCG/ACT inhaler Inhale 2 puffs into the lungs every 4 (four) hours as needed for wheezing. 1 each 2  . ALPRAZolam (XANAX) 0.5 MG tablet TAKE 1 TABLET BY MOUTH TWICE DAILY AS NEEDED FOR ANXIETY 30 tablet 1  . buPROPion (WELLBUTRIN XL) 150 MG 24 hr tablet Take 1 tablet (150 mg total) by mouth daily. 90 tablet 1   No current facility-administered medications for this visit.   Allergies: Patient has no known allergies.  Patient's last menstrual period was 06/12/2012.  Past medical history,surgical history, problem list, medications, allergies, family history and social history were all reviewed and documented as reviewed in the EPIC chart.  ROS: Pertinent positives and negatives as reviewed in HPI   OBJECTIVE:  BP 124/80 (BP Location: Right Arm, Patient Position: Sitting, Cuff Size: Normal)   Ht 5\' 5"  (1.651 m)   Wt 173 lb (78.5 kg)   LMP 06/12/2012   BMI 28.79 kg/m  The patient appears well, alert, oriented, in no distress. ENT normal.  Neck supple. No cervical or supraclavicular adenopathy or thyromegaly.  Lungs are clear, good air entry, no wheezes, rhonchi or rales. S1 and S2 normal, no murmurs, regular rate and rhythm.  Abdomen soft without tenderness, guarding, mass or organomegaly.  Neurological is normal, no focal findings.  BREAST EXAM: breasts appear normal, no suspicious masses, no skin or nipple changes or axillary nodes  PELVIC EXAM: VULVA: normal appearing vulva with atrophic change, no masses, tenderness or lesions, VAGINA: normal appearing vagina with atrophic change,  normal color and discharge, no lesions, CERVIX: surgically absent, UTERUS: surgically absent, vaginal cuff normal, ADNEXA: no masses, nontender  Chaperone: Wandra Scot Bonham present during the examination  ASSESSMENT:  55 y.o. G1P1001 here for annual gynecologic exam  PLAN:   1. Postmenopausal. Prior TVH BSO in 2014.  No significant menopausal symptoms 2. Pap smear 11/2017.  No significant history of abnormal Pap smears.  Address at next exam if she desires to consider screening at the next 3-year interval. 3. Mammogram 12/2019.  Normal breast exam today.  Continue with annual mammograms. 4. Colonoscopy 2019.  She will follow up at the recommended interval.   5. DEXA reportably was done elsewhere several years ago.  She should repeat this at age 73 given smoking history.  This will be further discussed at her next annual exam. 6. Health maintenance.  We will proceed to lab to check routine screening labs (lipid panel, CBC, CMP, TSH, vitamin D level screening).  The patient is aware that I will only be at this practice until early March 2022 so she knows to make sure she requests follow-up on any results if testing is not done while I am still at the practice.  Return annually or sooner, prn.  Joseph Pierini MD 02/18/20

## 2020-02-19 LAB — LIPID PANEL
Cholesterol: 162 mg/dL (ref <200)
HDL: 42 mg/dL — ABNORMAL LOW (ref >=50)
LDL Cholesterol (Calc): 100 mg/dL (calc) — ABNORMAL HIGH
Non-HDL Cholesterol (Calc): 120 mg/dL (calc) (ref <130)
Total CHOL/HDL Ratio: 3.9 (calc) (ref <5.0)
Triglycerides: 103 mg/dL (ref <150)

## 2020-02-19 LAB — COMPREHENSIVE METABOLIC PANEL
AG Ratio: 1.7 (calc) (ref 1.0–2.5)
ALT: 17 U/L (ref 6–29)
AST: 13 U/L (ref 10–35)
Albumin: 4 g/dL (ref 3.6–5.1)
Alkaline phosphatase (APISO): 60 U/L (ref 37–153)
BUN: 15 mg/dL (ref 7–25)
CO2: 27 mmol/L (ref 20–32)
Calcium: 8.9 mg/dL (ref 8.6–10.4)
Chloride: 107 mmol/L (ref 98–110)
Creat: 0.92 mg/dL (ref 0.50–1.05)
Globulin: 2.4 g/dL (calc) (ref 1.9–3.7)
Glucose, Bld: 82 mg/dL (ref 65–99)
Potassium: 4 mmol/L (ref 3.5–5.3)
Sodium: 142 mmol/L (ref 135–146)
Total Bilirubin: 0.3 mg/dL (ref 0.2–1.2)
Total Protein: 6.4 g/dL (ref 6.1–8.1)

## 2020-02-19 LAB — CBC
HCT: 42.7 % (ref 35.0–45.0)
Hemoglobin: 14.3 g/dL (ref 11.7–15.5)
MCH: 30.9 pg (ref 27.0–33.0)
MCHC: 33.5 g/dL (ref 32.0–36.0)
MCV: 92.2 fL (ref 80.0–100.0)
MPV: 11 fL (ref 7.5–12.5)
Platelets: 260 10*3/uL (ref 140–400)
RBC: 4.63 10*6/uL (ref 3.80–5.10)
RDW: 11.9 % (ref 11.0–15.0)
WBC: 7 10*3/uL (ref 3.8–10.8)

## 2020-02-19 LAB — TSH: TSH: 0.88 mIU/L

## 2020-02-19 LAB — VITAMIN D 25 HYDROXY (VIT D DEFICIENCY, FRACTURES): Vit D, 25-Hydroxy: 25 ng/mL — ABNORMAL LOW (ref 30–100)

## 2020-03-02 ENCOUNTER — Telehealth (INDEPENDENT_AMBULATORY_CARE_PROVIDER_SITE_OTHER): Payer: 59 | Admitting: Family Medicine

## 2020-03-02 ENCOUNTER — Telehealth: Payer: Self-pay | Admitting: *Deleted

## 2020-03-02 DIAGNOSIS — R059 Cough, unspecified: Secondary | ICD-10-CM

## 2020-03-02 NOTE — Telephone Encounter (Signed)
[name] are scheduled for a virtual visit with your provider today.    Just as we do with appointments in the office, we must obtain your consent to participate.  Your consent will be active for this visit and any virtual visit you may have with one of our providers in the next 365 days.    If you have a MyChart account, I can also send a copy of this consent to you electronically.  All virtual visits are billed to your insurance company just like a traditional visit in the office.  As this is a virtual visit, video technology does not allow for your provider to perform a traditional examination.  This may limit your provider's ability to fully assess your condition.  If your provider identifies any concerns that need to be evaluated in person or the need to arrange testing such as labs, EKG, etc, we will make arrangements to do so.    Although advances in technology are sophisticated, we cannot ensure that it will always work on either your end or our end.  If the connection with a video visit is poor, we may have to switch to a telephone visit.  With either a video or telephone visit, we are not always able to ensure that we have a secure connection.   I need to obtain your verbal consent now.   Are you willing to proceed with your visit today?   Ruth Robertson has provided verbal consent on 03/02/2020 for a virtual visit (video or telephone).
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
## 2020-03-02 NOTE — Patient Instructions (Signed)
Steps to Quit Smoking Smoking tobacco is the leading cause of preventable death. It can affect almost every organ in the body. Smoking puts you and people around you at risk for many serious, long-lasting (chronic) diseases. Quitting smoking can be hard, but it is one of the best things that you can do for your health. It is never too late to quit. How do I get ready to quit? When you decide to quit smoking, make a plan to help you succeed. Before you quit:  Pick a date to quit. Set a date within the next 2 weeks to give you time to prepare.  Write down the reasons why you are quitting. Keep this list in places where you will see it often.  Tell your family, friends, and co-workers that you are quitting. Their support is important.  Talk with your doctor about the choices that may help you quit.  Find out if your health insurance will pay for these treatments.  Know the people, places, things, and activities that make you want to smoke (triggers). Avoid them. What first steps can I take to quit smoking?  Throw away all cigarettes at home, at work, and in your car.  Throw away the things that you use when you smoke, such as ashtrays and lighters.  Clean your car. Make sure to empty the ashtray.  Clean your home, including curtains and carpets. What can I do to help me quit smoking? Talk with your doctor about taking medicines and seeing a counselor at the same time. You are more likely to succeed when you do both.  If you are pregnant or breastfeeding, talk with your doctor about counseling or other ways to quit smoking. Do not take medicine to help you quit smoking unless your doctor tells you to do so. To quit smoking: Quit right away  Quit smoking totally, instead of slowly cutting back on how much you smoke over a period of time.  Go to counseling. You are more likely to quit if you go to counseling sessions regularly. Take medicine You may take medicines to help you quit. Some  medicines need a prescription, and some you can buy over-the-counter. Some medicines may contain a drug called nicotine to replace the nicotine in cigarettes. Medicines may:  Help you to stop having the desire to smoke (cravings).  Help to stop the problems that come when you stop smoking (withdrawal symptoms). Your doctor may ask you to use:  Nicotine patches, gum, or lozenges.  Nicotine inhalers or sprays.  Non-nicotine medicine that is taken by mouth. Find resources Find resources and other ways to help you quit smoking and remain smoke-free after you quit. These resources are most helpful when you use them often. They include:  Online chats with a counselor.  Phone quitlines.  Printed self-help materials.  Support groups or group counseling.  Text messaging programs.  Mobile phone apps. Use apps on your mobile phone or tablet that can help you stick to your quit plan. There are many free apps for mobile phones and tablets as well as websites. Examples include Quit Guide from the CDC and smokefree.gov   What things can I do to make it easier to quit?  Talk to your family and friends. Ask them to support and encourage you.  Call a phone quitline (1-800-QUIT-NOW), reach out to support groups, or work with a counselor.  Ask people who smoke to not smoke around you.  Avoid places that make you want to smoke,   such as: ? Bars. ? Parties. ? Smoke-break areas at work.  Spend time with people who do not smoke.  Lower the stress in your life. Stress can make you want to smoke. Try these things to help your stress: ? Getting regular exercise. ? Doing deep-breathing exercises. ? Doing yoga. ? Meditating. ? Doing a body scan. To do this, close your eyes, focus on one area of your body at a time from head to toe. Notice which parts of your body are tense. Try to relax the muscles in those areas.   How will I feel when I quit smoking? Day 1 to 3 weeks Within the first 24 hours,  you may start to have some problems that come from quitting tobacco. These problems are very bad 2-3 days after you quit, but they do not often last for more than 2-3 weeks. You may get these symptoms:  Mood swings.  Feeling restless, nervous, angry, or annoyed.  Trouble concentrating.  Dizziness.  Strong desire for high-sugar foods and nicotine.  Weight gain.  Trouble pooping (constipation).  Feeling like you may vomit (nausea).  Coughing or a sore throat.  Changes in how the medicines that you take for other issues work in your body.  Depression.  Trouble sleeping (insomnia). Week 3 and afterward After the first 2-3 weeks of quitting, you may start to notice more positive results, such as:  Better sense of smell and taste.  Less coughing and sore throat.  Slower heart rate.  Lower blood pressure.  Clearer skin.  Better breathing.  Fewer sick days. Quitting smoking can be hard. Do not give up if you fail the first time. Some people need to try a few times before they succeed. Do your best to stick to your quit plan, and talk with your doctor if you have any questions or concerns. Summary  Smoking tobacco is the leading cause of preventable death. Quitting smoking can be hard, but it is one of the best things that you can do for your health.  When you decide to quit smoking, make a plan to help you succeed.  Quit smoking right away, not slowly over a period of time.  When you start quitting, seek help from your doctor, family, or friends. This information is not intended to replace advice given to you by your health care provider. Make sure you discuss any questions you have with your health care provider. Document Revised: 10/16/2018 Document Reviewed: 04/11/2018 Elsevier Patient Education  2021 Elsevier Inc.  

## 2020-03-07 ENCOUNTER — Encounter: Payer: Self-pay | Admitting: Family Medicine

## 2020-04-13 ENCOUNTER — Encounter: Payer: Self-pay | Admitting: Family Medicine

## 2020-04-26 ENCOUNTER — Encounter: Payer: Self-pay | Admitting: Family Medicine

## 2020-04-26 NOTE — Telephone Encounter (Signed)
Copied into phone message in patient chart

## 2020-04-27 ENCOUNTER — Encounter: Payer: Self-pay | Admitting: Family Medicine

## 2020-05-09 ENCOUNTER — Other Ambulatory Visit: Payer: Self-pay | Admitting: Surgery

## 2020-05-09 DIAGNOSIS — L02214 Cutaneous abscess of groin: Secondary | ICD-10-CM | POA: Diagnosis not present

## 2020-05-29 ENCOUNTER — Ambulatory Visit: Payer: 59 | Admitting: Family Medicine

## 2020-05-29 ENCOUNTER — Other Ambulatory Visit (HOSPITAL_COMMUNITY): Payer: Self-pay

## 2020-05-29 ENCOUNTER — Encounter: Payer: Self-pay | Admitting: Family Medicine

## 2020-05-29 ENCOUNTER — Other Ambulatory Visit: Payer: Self-pay

## 2020-05-29 VITALS — BP 110/66 | Temp 97.0°F | Wt 170.8 lb

## 2020-05-29 DIAGNOSIS — F419 Anxiety disorder, unspecified: Secondary | ICD-10-CM | POA: Diagnosis not present

## 2020-05-29 DIAGNOSIS — F320 Major depressive disorder, single episode, mild: Secondary | ICD-10-CM

## 2020-05-29 MED ORDER — FLUOXETINE HCL 10 MG PO CAPS
10.0000 mg | ORAL_CAPSULE | Freq: Every day | ORAL | 1 refills | Status: DC
  Filled 2020-05-29: qty 90, 90d supply, fill #0
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
## 2020-06-05 ENCOUNTER — Ambulatory Visit: Payer: Self-pay | Admitting: Surgery

## 2020-06-05 DIAGNOSIS — L723 Sebaceous cyst: Secondary | ICD-10-CM | POA: Diagnosis not present

## 2020-06-05 NOTE — H&P (Signed)
Adah Salvage Appointment: 06/05/2020 2:40 PM Location: [facility] Patient #: [id number] DOB: [birth year] Divorced / Language: [name] / Race: White Female  History of Present Illness Marcello Moores A. Marybell Robards MD; 06/05/2020 3:53 PM) Patient words: Patient returns for follow-up of a left groin abscess seen by Dr. Rush Farmer about a month ago. She is better on antibiotics. It is still sore and is still present but not causing drainage or significant pain at this point.  The patient is a 55 year old female.    Physical Exam (Tanyika Barros A. Patrich Heinze MD; 06/05/2020 3:53 PM)  General Mental Status-Alert. General Appearance-Consistent with stated age. Hydration-Well hydrated. Voice-Normal.  Integumentary Note: And left inner thigh is a 3 cm x 5 cm area which appears to be a sebaceous cyst that is been inflamed.  Chest and Lung Exam Chest and lung exam reveals -quiet, even and easy respiratory effort with no use of accessory muscles and on auscultation, normal breath sounds, no adventitious sounds and normal vocal resonance. Inspection Chest Wall - Normal. Back - normal.  Cardiovascular Cardiovascular examination reveals -on palpation PMI is normal in location and amplitude, no palpable S3 or S4. Normal cardiac borders., normal heart sounds, regular rate and rhythm with no murmurs, carotid auscultation reveals no bruits and normal pedal pulses bilaterally.    Assessment & Plan (Georjean Toya A. Itay Mella MD; 06/05/2020 3:54 PM)  SEBACEOUS CYST (L72.3) Impression: left groin cyst  excision of 3 cm x 5 cm right groin SEBACEOUS cyst chronically inflamed   Recommend excision left groin cyst outpatient surgery Risk of bleeding, infection, recurrence, and the need for further treatments and/or procedures discussed.  Current Plans Pt Education - [employer] Free Text Education/Instructions: discussed with patient and provided information. The pathophysiology of skin & subcutaneous masses  was discussed. Natural history risks without surgery were discussed. I recommended surgery to remove the mass. I explained the technique of removal with use of local anesthesia & possible need for more aggressive sedation/anesthesia for patient comfort.  Risks such as bleeding, infection, wound breakdown, heart attack, death, and other risks were discussed. I noted a good likelihood this will help address the problem. Possibility that this will not correct all symptoms was explained. Possibility of regrowth/recurrence of the mass was discussed. We will work to minimize complications. Questions were answered. The patient expresses understanding & wishes to proceed with surgery.

## 2020-06-06 ENCOUNTER — Other Ambulatory Visit (HOSPITAL_COMMUNITY): Payer: Self-pay

## 2020-07-21 ENCOUNTER — Other Ambulatory Visit (HOSPITAL_COMMUNITY): Payer: Self-pay

## 2020-07-21 DIAGNOSIS — Z8701 Personal history of pneumonia (recurrent): Secondary | ICD-10-CM | POA: Insufficient documentation

## 2020-07-21 MED ORDER — BUDESONIDE-FORMOTEROL FUMARATE 160-4.5 MCG/ACT IN AERO
2.0000 | INHALATION_SPRAY | Freq: Two times a day (BID) | RESPIRATORY_TRACT | 2 refills | Status: DC
  Filled 2020-07-21: qty 10.2, 30d supply, fill #0

## 2020-09-09 ENCOUNTER — Ambulatory Visit
Admission: EM | Admit: 2020-09-09 | Discharge: 2020-09-09 | Disposition: A | Discharge status: Home / Self Care | Payer: 59

## 2020-09-09 ENCOUNTER — Other Ambulatory Visit: Payer: Self-pay

## 2020-09-09 ENCOUNTER — Encounter: Payer: Self-pay | Admitting: Emergency Medicine

## 2020-09-09 DIAGNOSIS — Z0110 Encounter for examination of ears and hearing without abnormal findings: Secondary | ICD-10-CM | POA: Diagnosis not present

## 2020-09-09 NOTE — ED Provider Notes (Signed)
[facility]    CSN: [id number] Arrival date & time: 09/09/20  0854      History   Chief Complaint No chief complaint on file.   HPI Ruth Robertson is a 55 y.o. female.   Pt complains or right ear itching that started today.  She tried to clean the ear with a q-tip and think part of the cotton is stuck in her ear.  Denies congestion, ear pain, fever, trouble hearing.     Past Medical History:  Diagnosis Date   Anxiety    Asthma    ONLY WITH BRONCHITIS   GAD (generalized anxiety disorder)    GERD (gastroesophageal reflux disease)    Microscopic hematuria 12/03/2017   Negative cystoscopy negative CAT scan   Pneumonia    PONV (postoperative nausea and vomiting)     Patient Active Problem List   Diagnosis Date Noted   Microscopic hematuria 12/03/2017   Partial small bowel obstruction ([place]) 06/19/2017   Enteritis 06/19/2017   Intractable vomiting 06/19/2017   Tobacco abuse 06/19/2017   Depression with anxiety 03/26/2011    Past Surgical History:  Procedure Laterality Date   AGILE CAPSULE N/A 07/09/2016   Procedure: AGILE CAPSULE;  Surgeon: Danie Binder, MD;  [facility];  Service: Endoscopy;  Laterality: N/A;  7:30am, pt to arrive at 7:00am   BIOPSY  09/19/2015   Procedure: BIOPSY;  Surgeon: Danie Binder, MD;  [facility]: [facility];  Service: Endoscopy;;  random colon bx's   COLONOSCOPY WITH PROPOFOL N/A 09/19/2015   Dr. Oneida Alar: five 2-3 mm polyps in rectum (hyperplastic) and descending colon, diverticulosis in sigmoid colon and transverse colon. Query post-infectious IBS. Next colonoscopy in 10 years    CYST EXCISION N/A 01/20/2019   Procedure: EXCISION OF RIGHT GROIN SEBACEOUS CYST;  Surgeon: Erroll Luna, MD;  Location: [facility];  Service: General;  Laterality: N/A;   GIVENS CAPSULE STUDY N/A 07/30/2016   Procedure: GIVENS CAPSULE STUDY;  Surgeon: Danie Binder, MD;  [facility];  Service:  Endoscopy;  Laterality: N/A;   HYSTEROSCOPY  2006   POLYPECTOMY  09/19/2015   Procedure: POLYPECTOMY;  Surgeon: Danie Binder, MD;  [facility];  Service: Endoscopy;;  descending colon polyps x3 cold bx, rectal polyp x2   SALPINGOOPHORECTOMY Bilateral 08/18/2012   Procedure: SALPINGO OOPHORECTOMY;  Surgeon: Anastasio Auerbach, MD;  Location: [facility];  Service: Gynecology;  Laterality: Bilateral;   VAGINAL HYSTERECTOMY N/A 08/18/2012   Procedure: HYSTERECTOMY VAGINAL;  Surgeon: Anastasio Auerbach, MD;  Location: [facility];  Service: Gynecology;  Laterality: N/A;    OB History     Gravida  1   Para  1   Term  1   Preterm      AB      Living  1      SAB      IAB      Ectopic      Multiple      Live Births               Home Medications    Prior to Admission medications   Medication Sig Start Date End Date Taking? Authorizing Provider  acetaminophen (TYLENOL) 325 MG tablet Take 2 tablets (650 mg total) by mouth every 6 (six) hours as needed for mild pain (or Fever >/= 101). 06/20/17   Isaac Bliss, Rayford Halsted, MD  albuterol (VENTOLIN HFA) 108 (90 Base) MCG/ACT inhaler INHALE 2  PUFFS BY MOUTH INTO THE LUNGS EVERY 4 (FOUR) HOURS AS NEEDED FOR WHEEZING. 01/19/20 01/18/21  Kathyrn Drown, MD  budesonide-formoterol [facility]) 160-4.5 MCG/ACT inhaler Inhale 2 puffs into the lungs 2 (two) times daily as directed 07/21/20     buPROPion (WELLBUTRIN XL) 150 MG 24 hr tablet TAKE 1 TABLET (150 MG TOTAL) BY MOUTH DAILY. 01/19/20 01/18/21  Kathyrn Drown, MD  FLUoxetine (PROZAC) 10 MG capsule Take 1 capsule (10 mg total) by mouth daily. 05/29/20   Kathyrn Drown, MD    Family History Family History  Problem Relation Age of Onset   Heart failure Father    Stroke Father    Breast cancer Maternal Aunt        50's   Diabetes Paternal Grandfather    Hypertension Maternal Grandmother    Colon cancer Maternal Aunt    Heart attack Mother     Social History Social  History   Tobacco Use   Smoking status: Every Day    Packs/day: 1.00    Years: 25.00    Pack years: 25.00    Types: Cigarettes   Smokeless tobacco: Never  Vaping Use   Vaping Use: Never used  Substance Use Topics   Alcohol use: Not Currently    Alcohol/week: 0.0 standard drinks    Comment: rare   Drug use: No     Allergies   Patient has no known allergies.   Review of Systems Review of Systems  Constitutional:  Negative for chills and fever.  HENT:  Negative for ear pain and sore throat.   Eyes:  Negative for pain and visual disturbance.  Respiratory:  Negative for cough and shortness of breath.   Cardiovascular:  Negative for chest pain and palpitations.  Gastrointestinal:  Negative for abdominal pain and vomiting.  Genitourinary:  Negative for dysuria and hematuria.  Musculoskeletal:  Negative for arthralgias and back pain.  Skin:  Negative for color change and rash.  Neurological:  Negative for seizures and syncope.  All other systems reviewed and are negative.   Physical Exam Triage Vital Signs ED Triage Vitals [09/09/20 0921]  Enc Vitals Group     BP 110/74     Pulse Rate 62     Resp 16     Temp 98 F (36.7 C)     Temp Source Oral     SpO2 96 %     Weight      Height      Head Circumference      Peak Flow      Pain Score 0     Pain Loc      Pain Edu?      Excl. in [place]?    No data found.  Updated Vital Signs BP 110/74 (BP Location: Right Arm)   Pulse 62   Temp 98 F (36.7 C) (Oral)   Resp 16   LMP 06/12/2012   SpO2 96%   Visual Acuity Right Eye Distance:   Left Eye Distance:   Bilateral Distance:    Right Eye Near:   Left Eye Near:    Bilateral Near:     Physical Exam Vitals and nursing note reviewed.  Constitutional:      General: She is not in acute distress.    Appearance: She is well-developed.  HENT:     Head: Normocephalic and atraumatic.     Right Ear: Hearing and tympanic membrane normal.     Ears:     Comments: No  cotton visualized, no foreign body.  There is some dry scaling skin without redness noted.  Eyes:     Conjunctiva/sclera: Conjunctivae normal.  Cardiovascular:     Rate and Rhythm: Normal rate and regular rhythm.     Heart sounds: No murmur heard. Pulmonary:     Effort: Pulmonary effort is normal. No respiratory distress.     Breath sounds: Normal breath sounds.  Abdominal:     Palpations: Abdomen is soft.     Tenderness: There is no abdominal tenderness.  Musculoskeletal:     Cervical back: Neck supple.  Skin:    General: Skin is warm and dry.  Neurological:     Mental Status: She is alert.     UC Treatments / Results  Labs (all labs ordered are listed, but only abnormal results are displayed) Labs Reviewed - No data to display  EKG   Radiology No results found.  Procedures Procedures (including critical care time)  Medications Ordered in UC Medications - No data to display  Initial Impression / Assessment and Plan / UC Course  I have reviewed the triage vital signs and the nursing notes.  Pertinent labs & imaging results that were available during my care of the patient were reviewed by me and considered in my medical decision making (see chart for details).     No foreign body noted in ear. TM normal, no signs of infection.  Discussed proper ear cleaning.  Final Clinical Impressions(s) / UC Diagnoses   Final diagnoses:  None   Discharge Instructions   None    ED Prescriptions   None    PDMP not reviewed this encounter.   Konrad Felix, PA-C 09/09/20 [phone]

## 2020-09-09 NOTE — ED Triage Notes (Signed)
Cleaned right ear this morning with a Q-tip and thinks part of the Q-tip may be stuck in that ear.

## 2020-12-28 ENCOUNTER — Observation Stay (HOSPITAL_COMMUNITY)
Admission: EM | Admit: 2020-12-28 | Discharge: 2020-12-30 | Disposition: A | Discharge status: Home / Self Care | Payer: 59 | Attending: Internal Medicine | Admitting: Internal Medicine

## 2020-12-28 ENCOUNTER — Other Ambulatory Visit: Payer: Self-pay

## 2020-12-28 ENCOUNTER — Emergency Department (HOSPITAL_COMMUNITY): Payer: 59

## 2020-12-28 DIAGNOSIS — J45909 Unspecified asthma, uncomplicated: Secondary | ICD-10-CM | POA: Diagnosis not present

## 2020-12-28 DIAGNOSIS — R197 Diarrhea, unspecified: Secondary | ICD-10-CM

## 2020-12-28 DIAGNOSIS — R109 Unspecified abdominal pain: Secondary | ICD-10-CM | POA: Diagnosis not present

## 2020-12-28 DIAGNOSIS — Z7951 Long term (current) use of inhaled steroids: Secondary | ICD-10-CM | POA: Diagnosis not present

## 2020-12-28 DIAGNOSIS — R1084 Generalized abdominal pain: Secondary | ICD-10-CM

## 2020-12-28 DIAGNOSIS — R112 Nausea with vomiting, unspecified: Secondary | ICD-10-CM

## 2020-12-28 DIAGNOSIS — Z720 Tobacco use: Secondary | ICD-10-CM | POA: Diagnosis present

## 2020-12-28 DIAGNOSIS — F17210 Nicotine dependence, cigarettes, uncomplicated: Secondary | ICD-10-CM | POA: Diagnosis not present

## 2020-12-28 DIAGNOSIS — Z20822 Contact with and (suspected) exposure to covid-19: Secondary | ICD-10-CM | POA: Diagnosis not present

## 2020-12-28 DIAGNOSIS — K529 Noninfective gastroenteritis and colitis, unspecified: Secondary | ICD-10-CM | POA: Diagnosis not present

## 2020-12-28 LAB — COMPREHENSIVE METABOLIC PANEL
ALT: 37 U/L (ref 0–44)
AST: 22 U/L (ref 15–41)
Albumin: 4.2 g/dL (ref 3.5–5.0)
Alkaline Phosphatase: 68 U/L (ref 38–126)
Anion gap: 12 (ref 5–15)
BUN: 14 mg/dL (ref 6–20)
CO2: 21 mmol/L — ABNORMAL LOW (ref 22–32)
Calcium: 9.2 mg/dL (ref 8.9–10.3)
Chloride: 108 mmol/L (ref 98–111)
Creatinine, Ser: 0.9 mg/dL (ref 0.44–1.00)
GFR, Estimated: >60 mL/min (ref >60)
Glucose, Bld: 109 mg/dL — ABNORMAL HIGH (ref 70–99)
Potassium: 4 mmol/L (ref 3.5–5.1)
Sodium: 141 mmol/L (ref 135–145)
Total Bilirubin: 0.6 mg/dL (ref 0.3–1.2)
Total Protein: 7.7 g/dL (ref 6.5–8.1)

## 2020-12-28 LAB — CBC WITH DIFFERENTIAL/PLATELET
Abs Immature Granulocytes: 0.03 10*3/uL (ref 0.00–0.07)
Basophils Absolute: 0 10*3/uL (ref 0.0–0.1)
Basophils Relative: 0 %
Eosinophils Absolute: 0.1 10*3/uL (ref 0.0–0.5)
Eosinophils Relative: 1 %
HCT: 51.8 % — ABNORMAL HIGH (ref 36.0–46.0)
Hemoglobin: 16.8 g/dL — ABNORMAL HIGH (ref 12.0–15.0)
Immature Granulocytes: 0 %
Lymphocytes Relative: 12 %
Lymphs Abs: 1.3 10*3/uL (ref 0.7–4.0)
MCH: 30.8 pg (ref 26.0–34.0)
MCHC: 32.4 g/dL (ref 30.0–36.0)
MCV: 94.9 fL (ref 80.0–100.0)
Monocytes Absolute: 0.7 10*3/uL (ref 0.1–1.0)
Monocytes Relative: 7 %
Neutro Abs: 8.3 10*3/uL — ABNORMAL HIGH (ref 1.7–7.7)
Neutrophils Relative %: 80 %
Platelets: 266 10*3/uL (ref 150–400)
RBC: 5.46 MIL/uL — ABNORMAL HIGH (ref 3.87–5.11)
RDW: 13.2 % (ref 11.5–15.5)
WBC: 10.5 10*3/uL (ref 4.0–10.5)
nRBC: 0 % (ref 0.0–0.2)

## 2020-12-28 LAB — LIPASE, BLOOD: Lipase: 30 U/L (ref 11–51)

## 2020-12-28 MED ORDER — FAMOTIDINE IN NACL 20-0.9 MG/50ML-% IV SOLN
20.0000 mg | Freq: Once | INTRAVENOUS | Status: AC
  Administered 2020-12-28: 20 mg via INTRAVENOUS
  Filled 2020-12-28: qty 50

## 2020-12-28 MED ORDER — IOHEXOL 300 MG/ML  SOLN
100.0000 mL | Freq: Once | INTRAMUSCULAR | Status: AC | PRN
  Administered 2020-12-28: 100 mL via INTRAVENOUS

## 2020-12-28 MED ORDER — ONDANSETRON HCL 4 MG/2ML IJ SOLN
4.0000 mg | Freq: Once | INTRAMUSCULAR | Status: AC
  Administered 2020-12-28: 4 mg via INTRAVENOUS
  Filled 2020-12-28: qty 2

## 2020-12-28 MED ORDER — SUCRALFATE 1 GM/10ML PO SUSP
1.0000 g | Freq: Once | ORAL | Status: DC
  Filled 2020-12-28: qty 10

## 2020-12-28 MED ORDER — SODIUM CHLORIDE 0.9 % IV BOLUS
1000.0000 mL | Freq: Once | INTRAVENOUS | Status: AC
  Administered 2020-12-28: 1000 mL via INTRAVENOUS

## 2020-12-28 MED ORDER — HYDROMORPHONE HCL 1 MG/ML IJ SOLN
1.0000 mg | Freq: Once | INTRAMUSCULAR | Status: AC
  Administered 2020-12-28: 1 mg via INTRAVENOUS
  Filled 2020-12-28: qty 1

## 2020-12-28 NOTE — ED Notes (Signed)
Patient given water for fluid challenge.

## 2020-12-28 NOTE — ED Notes (Signed)
Patient given water for PO challenge. Went to sit patient up to drink water, pt with increased pain. Only drank 2 sips of water. Patient stated 6/10 pain. Walked to restroom with steady gait. Came back to room, got into stretcher and then stated feels like she was incontinent of bowels. Patient walked back to bathroom to clean up. Given rags and clean underwear. Patient tearful.

## 2020-12-28 NOTE — ED Provider Notes (Signed)
[facility] EMERGENCY DEPARTMENT Provider Note   CSN: [id number] Arrival date & time: 12/28/20  1654     History Chief Complaint  Patient presents with   Abdominal Pain    Ruth Robertson is a 55 y.o. female.  She is here with complaint of generalized abdominal pain that started last evening.  Multiple episodes of diarrhea throughout the night and today.  Abdominal pain is 8 out of 10 stabbing in nature.  Nausea no vomiting.  No fevers or chills.  No blood in the diarrhea.  She has had a few similar exacerbations of this and she tells me there is some sort of blockage at the juncture of the small intestine in the large intestine but this is never required surgery.  No prior abdominal surgeries.  The history is provided by the patient.  Abdominal Pain Pain location:  Generalized Pain quality: aching and stabbing   Pain radiates to:  Does not radiate Pain severity:  Severe Onset quality:  Gradual Duration:  1 day Timing:  Constant Progression:  Unchanged Chronicity:  Recurrent Context: not trauma   Relieved by:  Nothing Worsened by:  Nothing Ineffective treatments:  None tried Associated symptoms: belching, diarrhea and nausea   Associated symptoms: no chest pain, no constipation, no cough, no dysuria, no fever, no hematemesis, no hematochezia, no hematuria, no melena, no shortness of breath, no sore throat and no vomiting       Past Medical History:  Diagnosis Date   Anxiety    Asthma    ONLY WITH BRONCHITIS   GAD (generalized anxiety disorder)    GERD (gastroesophageal reflux disease)    Microscopic hematuria 12/03/2017   Negative cystoscopy negative CAT scan   Pneumonia    PONV (postoperative nausea and vomiting)     Patient Active Problem List   Diagnosis Date Noted   Microscopic hematuria 12/03/2017   Partial small bowel obstruction (HCC) 06/19/2017   Enteritis 06/19/2017   Intractable vomiting 06/19/2017   Tobacco abuse 06/19/2017   Depression  with anxiety 03/26/2011    Past Surgical History:  Procedure Laterality Date   AGILE CAPSULE N/A 07/09/2016   Procedure: AGILE CAPSULE;  Surgeon: Danie Binder, MD;  [facility]: [facility];  Service: Endoscopy;  Laterality: N/A;  7:30am, pt to arrive at 7:00am   BIOPSY  09/19/2015   Procedure: BIOPSY;  Surgeon: Danie Binder, MD;  Location: [facility];  Service: Endoscopy;;  random colon bx's   COLONOSCOPY WITH PROPOFOL N/A 09/19/2015   Dr. Oneida Alar: five 2-3 mm polyps in rectum (hyperplastic) and descending colon, diverticulosis in sigmoid colon and transverse colon. Query post-infectious IBS. Next colonoscopy in 10 years    CYST EXCISION N/A 01/20/2019   Procedure: EXCISION OF RIGHT GROIN SEBACEOUS CYST;  Surgeon: Erroll Luna, MD;  Location: [facility];  Service: General;  Laterality: N/A;   GIVENS CAPSULE STUDY N/A 07/30/2016   Procedure: GIVENS CAPSULE STUDY;  Surgeon: Danie Binder, MD;  Location: [facility];  Service: Endoscopy;  Laterality: N/A;   HYSTEROSCOPY  2006   POLYPECTOMY  09/19/2015   Procedure: POLYPECTOMY;  Surgeon: Danie Binder, MD;  Location: [facility];  Service: Endoscopy;;  descending colon polyps x3 cold bx, rectal polyp x2   SALPINGOOPHORECTOMY Bilateral 08/18/2012   Procedure: SALPINGO OOPHORECTOMY;  Surgeon: Anastasio Auerbach, MD;  Location: [facility];  Service: Gynecology;  Laterality: Bilateral;   VAGINAL HYSTERECTOMY N/A 08/18/2012   Procedure: HYSTERECTOMY VAGINAL;  Surgeon: Christia Reading  Corey Skains, MD;  Location: [facility];  Service: Gynecology;  Laterality: N/A;     OB History     Gravida  1   Para  1   Term  1   Preterm      AB      Living  1      SAB      IAB      Ectopic      Multiple      Live Births              Family History  Problem Relation Age of Onset   Heart failure Father    Stroke Father    Breast cancer Maternal Aunt        50's   Diabetes Paternal Grandfather    Hypertension Maternal  Grandmother    Colon cancer Maternal Aunt    Heart attack Mother     Social History   Tobacco Use   Smoking status: Every Day    Packs/day: 1.00    Years: 25.00    Pack years: 25.00    Types: Cigarettes   Smokeless tobacco: Never  Vaping Use   Vaping Use: Never used  Substance Use Topics   Alcohol use: Not Currently    Alcohol/week: 0.0 standard drinks    Comment: rare   Drug use: No    Home Medications Prior to Admission medications   Medication Sig Start Date End Date Taking? Authorizing Provider  acetaminophen (TYLENOL) 325 MG tablet Take 2 tablets (650 mg total) by mouth every 6 (six) hours as needed for mild pain (or Fever >/= 101). 06/20/17  Yes Isaac Bliss, Rayford Halsted, MD  albuterol (VENTOLIN HFA) 108 (90 Base) MCG/ACT inhaler INHALE 2 PUFFS BY MOUTH INTO THE LUNGS EVERY 4 (FOUR) HOURS AS NEEDED FOR WHEEZING. 01/19/20 01/18/21 Yes Kathyrn Drown, MD  budesonide-formoterol (SYMBICORT) 160-4.5 MCG/ACT inhaler Inhale 2 puffs into the lungs 2 (two) times daily as directed 07/21/20  Yes   buPROPion (WELLBUTRIN XL) 150 MG 24 hr tablet TAKE 1 TABLET (150 MG TOTAL) BY MOUTH DAILY. Patient not taking: Reported on 12/28/2020 01/19/20 01/18/21  Kathyrn Drown, MD  FLUoxetine (PROZAC) 10 MG capsule Take 1 capsule (10 mg total) by mouth daily. Patient not taking: Reported on 12/28/2020 05/29/20   Kathyrn Drown, MD    Allergies    Patient has no known allergies.  Review of Systems   Review of Systems  Constitutional:  Negative for fever.  HENT:  Negative for sore throat.   Eyes:  Negative for visual disturbance.  Respiratory:  Negative for cough and shortness of breath.   Cardiovascular:  Negative for chest pain.  Gastrointestinal:  Positive for abdominal pain, diarrhea and nausea. Negative for constipation, hematemesis, hematochezia, melena and vomiting.  Genitourinary:  Negative for dysuria and hematuria.  Musculoskeletal:  Negative for neck pain.  Skin:  Negative for  rash.  Neurological:  Negative for headaches.   Physical Exam Updated Vital Signs BP (!) 118/101 (BP Location: Right Arm)   Pulse 87   Temp 98.3 F (36.8 C) (Oral)   Resp 18   Ht 5\' 5"  (1.651 m)   Wt 78 kg   LMP 06/12/2012   SpO2 100%   BMI 28.62 kg/m   Physical Exam Vitals and nursing note reviewed.  Constitutional:      General: She is in acute distress.     Appearance: Normal appearance. She is well-developed.  HENT:  Head: Normocephalic and atraumatic.  Eyes:     Conjunctiva/sclera: Conjunctivae normal.  Cardiovascular:     Rate and Rhythm: Normal rate and regular rhythm.     Heart sounds: No murmur heard. Pulmonary:     Effort: Pulmonary effort is normal. No respiratory distress.     Breath sounds: Normal breath sounds.  Abdominal:     General: There is no distension.     Palpations: Abdomen is soft.     Tenderness: There is generalized abdominal tenderness. There is no guarding or rebound.  Musculoskeletal:        General: No swelling. Normal range of motion.     Cervical back: Neck supple.     Right lower leg: No edema.     Left lower leg: No edema.  Skin:    General: Skin is warm and dry.     Capillary Refill: Capillary refill takes less than 2 seconds.  Neurological:     General: No focal deficit present.     Mental Status: She is alert and oriented to person, place, and time.  Psychiatric:        Mood and Affect: Mood normal.    ED Results / Procedures / Treatments   Labs (all labs ordered are listed, but only abnormal results are displayed) Labs Reviewed  COMPREHENSIVE METABOLIC PANEL - Abnormal; Notable for the following components:      Result Value   CO2 21 (*)    Glucose, Bld 109 (*)    All other components within normal limits  CBC WITH DIFFERENTIAL/PLATELET - Abnormal; Notable for the following components:   RBC 5.46 (*)    Hemoglobin 16.8 (*)    HCT 51.8 (*)    Neutro Abs 8.3 (*)    All other components within normal limits   COMPREHENSIVE METABOLIC PANEL - Abnormal; Notable for the following components:   Glucose, Bld 112 (*)    Calcium 8.4 (*)    All other components within normal limits  CBC WITH DIFFERENTIAL/PLATELET - Abnormal; Notable for the following components:   Hemoglobin 15.4 (*)    HCT 48.5 (*)    All other components within normal limits  RESP PANEL BY RT-PCR (FLU A&B, COVID) ARPGX2  LIPASE, BLOOD  MAGNESIUM  HIV ANTIBODY (ROUTINE TESTING W REFLEX)  URINALYSIS, ROUTINE W REFLEX MICROSCOPIC    EKG EKG Interpretation  Date/Time:  Thursday December 28 2020 22:21:48 EST Ventricular Rate:  89 PR Interval:  129 QRS Duration: 93 QT Interval:  374 QTC Calculation: 456 R Axis:   72 Text Interpretation: Sinus rhythm Low voltage, precordial leads Probable anteroseptal infarct, old No old tracing to compare Confirmed by Aletta Edouard [phone]) on 12/28/2020 10:30:01 PM  Radiology CT Abdomen Pelvis W Contrast  Result Date: 12/28/2020 CLINICAL DATA:  Abdominal pain. EXAM: CT ABDOMEN AND PELVIS WITH CONTRAST TECHNIQUE: Multidetector CT imaging of the abdomen and pelvis was performed using the standard protocol following bolus administration of intravenous contrast. CONTRAST:  142mL OMNIPAQUE IOHEXOL 300 MG/ML  SOLN COMPARISON:  CT abdomen pelvis dated 06/19/2017. FINDINGS: Lower chest: Minimal bibasilar atelectasis. The visualized lung bases are otherwise clear. No intra-abdominal free air or free fluid. Hepatobiliary: Fatty liver. No intrahepatic biliary dilatation. There is a 1.8 x 1.4 cm enhancing lesion in the dome of the liver better characterized on the MRI of 11/16/2018 and appears similar to the CT of 2018. The gallbladder is unremarkable. Pancreas: Unremarkable. No pancreatic ductal dilatation or surrounding inflammatory changes. Spleen: Normal in size without focal  abnormality. Adrenals/Urinary Tract: The adrenal glands are unremarkable. The kidneys, visualized ureters, and urinary bladder appear  unremarkable. Stomach/Bowel: There is diffuse thickened and inflamed appearance of the small bowel consistent with enteritis. There is loose stool throughout the colon compatible with diarrheal state. No bowel obstruction. The appendix is normal. Vascular/Lymphatic: The abdominal aorta and IVC are unremarkable. No portal venous gas. There is no adenopathy. Reproductive: Hysterectomy.  No adnexal masses. Other: None Musculoskeletal: No acute or significant osseous findings. IMPRESSION: 1. Enteritis with diarrheal state. No bowel obstruction. Normal appendix. 2. Fatty liver. 3. Stable enhancing lesion in the dome of the liver suggested to represent and [place] on the MRI of 07/30/2017. Electronically Signed   By: Anner Crete M.D.   On: 12/28/2020 20:49    Procedures Procedures   Medications Ordered in ED Medications  sodium chloride 0.9 % bolus 1,000 mL (has no administration in time range)  ondansetron (ZOFRAN) injection 4 mg (has no administration in time range)  HYDROmorphone (DILAUDID) injection 1 mg (has no administration in time range)    ED Course  I have reviewed the triage vital signs and the nursing notes.  Pertinent labs & imaging results that were available during my care of the patient were reviewed by me and considered in my medical decision making (see chart for details).  Clinical Course as of 12/29/20 1030  Thu Dec 28, 2020  2101 Reviewed results with patient.  Will p.o. trial.  I also reviewed her last GI note, sounds like she is had a fairly extensive work-up. [MB]  2300 Discussed with Dr. Clearence Ped who will evaluate the patient for admission. [MB]  2300 Patient unable to tolerate p.o. [MB]    Clinical Course User Index [MB] Hayden Rasmussen, MD   MDM Rules/Calculators/A&P                          This patient complains of abdominal pain nausea vomiting diarrhea; this involves an extensive number of treatment Options and is a complaint that carries with it a high  risk of complications and Morbidity. The differential includes gastroenteritis, colitis, diverticulitis, perforation, obstruction  I ordered, reviewed and interpreted labs, which included CBC with normal white count, hemoglobin slightly elevated from priors, chemistries with mildly low bicarb and elevated glucose, COVID and flu negative I ordered medication IV fluids IV pain medication nausea medication acid medication.  Patient refused Carafate I ordered imaging studies which included CT abdomen and pelvis and I independently    visualized and interpreted imaging which showed small bowel thickening consistent with enteritis  Previous records obtained and reviewed patient had been following with [facility] for her episodic abdominal pain with no clear etiology identified I consulted [facility] hospitalist Dr.Zierle-Ghosh And discussed lab and imaging findings  Critical Interventions: None  After the interventions stated above, I reevaluated the patient and found patient had a benign exam although states she cannot tolerate any p.o. and think she needs to be admitted to the hospital.   Final Clinical Impression(s) / ED Diagnoses Final diagnoses:  Generalized abdominal pain  Nausea vomiting and diarrhea  Acute gastroenteritis    Rx / DC Orders ED Discharge Orders     None        Hayden Rasmussen, MD 12/29/20 1034

## 2020-12-28 NOTE — ED Triage Notes (Signed)
Abd pain started at midnight. Diarrhea started this morning. Belching a lot. Denies vomiting.

## 2020-12-29 ENCOUNTER — Encounter (HOSPITAL_COMMUNITY): Payer: Self-pay | Admitting: Family Medicine

## 2020-12-29 DIAGNOSIS — R109 Unspecified abdominal pain: Secondary | ICD-10-CM | POA: Diagnosis not present

## 2020-12-29 DIAGNOSIS — Z20822 Contact with and (suspected) exposure to covid-19: Secondary | ICD-10-CM | POA: Diagnosis not present

## 2020-12-29 DIAGNOSIS — K529 Noninfective gastroenteritis and colitis, unspecified: Secondary | ICD-10-CM | POA: Diagnosis not present

## 2020-12-29 DIAGNOSIS — J45909 Unspecified asthma, uncomplicated: Secondary | ICD-10-CM | POA: Diagnosis not present

## 2020-12-29 DIAGNOSIS — F17210 Nicotine dependence, cigarettes, uncomplicated: Secondary | ICD-10-CM | POA: Diagnosis not present

## 2020-12-29 DIAGNOSIS — Z7951 Long term (current) use of inhaled steroids: Secondary | ICD-10-CM | POA: Diagnosis not present

## 2020-12-29 LAB — COMPREHENSIVE METABOLIC PANEL
ALT: 28 U/L (ref 0–44)
AST: 19 U/L (ref 15–41)
Albumin: 3.5 g/dL (ref 3.5–5.0)
Alkaline Phosphatase: 57 U/L (ref 38–126)
Anion gap: 10 (ref 5–15)
BUN: 12 mg/dL (ref 6–20)
CO2: 22 mmol/L (ref 22–32)
Calcium: 8.4 mg/dL — ABNORMAL LOW (ref 8.9–10.3)
Chloride: 110 mmol/L (ref 98–111)
Creatinine, Ser: 0.88 mg/dL (ref 0.44–1.00)
GFR, Estimated: >60 mL/min (ref >60)
Glucose, Bld: 112 mg/dL — ABNORMAL HIGH (ref 70–99)
Potassium: 4.1 mmol/L (ref 3.5–5.1)
Sodium: 142 mmol/L (ref 135–145)
Total Bilirubin: 0.8 mg/dL (ref 0.3–1.2)
Total Protein: 6.5 g/dL (ref 6.5–8.1)

## 2020-12-29 LAB — CBC WITH DIFFERENTIAL/PLATELET
Abs Immature Granulocytes: 0.02 10*3/uL (ref 0.00–0.07)
Basophils Absolute: 0.1 10*3/uL (ref 0.0–0.1)
Basophils Relative: 1 %
Eosinophils Absolute: 0.1 10*3/uL (ref 0.0–0.5)
Eosinophils Relative: 2 %
HCT: 48.5 % — ABNORMAL HIGH (ref 36.0–46.0)
Hemoglobin: 15.4 g/dL — ABNORMAL HIGH (ref 12.0–15.0)
Immature Granulocytes: 0 %
Lymphocytes Relative: 12 %
Lymphs Abs: 0.8 10*3/uL (ref 0.7–4.0)
MCH: 30.3 pg (ref 26.0–34.0)
MCHC: 31.8 g/dL (ref 30.0–36.0)
MCV: 95.3 fL (ref 80.0–100.0)
Monocytes Absolute: 0.8 10*3/uL (ref 0.1–1.0)
Monocytes Relative: 13 %
Neutro Abs: 4.9 10*3/uL (ref 1.7–7.7)
Neutrophils Relative %: 72 %
Platelets: 265 10*3/uL (ref 150–400)
RBC: 5.09 MIL/uL (ref 3.87–5.11)
RDW: 13.2 % (ref 11.5–15.5)
WBC: 6.6 10*3/uL (ref 4.0–10.5)
nRBC: 0 % (ref 0.0–0.2)

## 2020-12-29 LAB — MAGNESIUM: Magnesium: 2 mg/dL (ref 1.7–2.4)

## 2020-12-29 LAB — RESP PANEL BY RT-PCR (FLU A&B, COVID) ARPGX2
Influenza A by PCR: NEGATIVE
Influenza B by PCR: NEGATIVE
SARS Coronavirus 2 by RT PCR: NEGATIVE

## 2020-12-29 LAB — HIV ANTIBODY (ROUTINE TESTING W REFLEX): HIV Screen 4th Generation wRfx: NONREACTIVE

## 2020-12-29 MED ORDER — MOMETASONE FURO-FORMOTEROL FUM 200-5 MCG/ACT IN AERO
2.0000 | INHALATION_SPRAY | Freq: Two times a day (BID) | RESPIRATORY_TRACT | Status: DC
  Filled 2020-12-29: qty 8.8

## 2020-12-29 MED ORDER — HEPARIN SODIUM (PORCINE) 5000 UNIT/ML IJ SOLN
5000.0000 [IU] | Freq: Three times a day (TID) | INTRAMUSCULAR | Status: DC
  Administered 2020-12-29 – 2020-12-30 (×3): 5000 [IU] via SUBCUTANEOUS
  Filled 2020-12-29 (×3): qty 1

## 2020-12-29 MED ORDER — ACETAMINOPHEN 325 MG PO TABS
650.0000 mg | ORAL_TABLET | Freq: Four times a day (QID) | ORAL | Status: DC | PRN
  Administered 2020-12-29 – 2020-12-30 (×2): 650 mg via ORAL
  Filled 2020-12-29 (×2): qty 2

## 2020-12-29 MED ORDER — MOMETASONE FURO-FORMOTEROL FUM 200-5 MCG/ACT IN AERO: 2.0000 | INHALATION_SPRAY | Freq: Two times a day (BID) | RESPIRATORY_TRACT | Status: DC | PRN

## 2020-12-29 MED ORDER — PANTOPRAZOLE SODIUM 40 MG IV SOLR
40.0000 mg | Freq: Two times a day (BID) | INTRAVENOUS | Status: DC
  Administered 2020-12-29 – 2020-12-30 (×3): 40 mg via INTRAVENOUS
  Filled 2020-12-29 (×3): qty 40

## 2020-12-29 MED ORDER — OXYCODONE HCL 5 MG PO TABS
5.0000 mg | ORAL_TABLET | ORAL | Status: DC | PRN
  Administered 2020-12-29: 5 mg via ORAL
  Filled 2020-12-29: qty 1

## 2020-12-29 MED ORDER — ACETAMINOPHEN 650 MG RE SUPP: 650.0000 mg | Freq: Four times a day (QID) | RECTAL | Status: DC | PRN

## 2020-12-29 MED ORDER — ONDANSETRON HCL 4 MG PO TABS
4.0000 mg | ORAL_TABLET | Freq: Four times a day (QID) | ORAL | Status: DC | PRN
  Administered 2020-12-30: 4 mg via ORAL
  Filled 2020-12-29: qty 1

## 2020-12-29 MED ORDER — ALBUTEROL SULFATE HFA 108 (90 BASE) MCG/ACT IN AERS: 1.0000 | INHALATION_SPRAY | RESPIRATORY_TRACT | Status: DC | PRN

## 2020-12-29 MED ORDER — SODIUM CHLORIDE 0.9 % IV SOLN: INTRAVENOUS | Status: DC

## 2020-12-29 MED ORDER — MORPHINE SULFATE (PF) 2 MG/ML IV SOLN
2.0000 mg | INTRAVENOUS | Status: DC | PRN
  Administered 2020-12-29: 2 mg via INTRAVENOUS
  Filled 2020-12-29: qty 1

## 2020-12-29 MED ORDER — ONDANSETRON HCL 4 MG/2ML IJ SOLN
4.0000 mg | Freq: Four times a day (QID) | INTRAMUSCULAR | Status: DC | PRN
  Administered 2020-12-29 (×2): 4 mg via INTRAVENOUS
  Filled 2020-12-29 (×2): qty 2

## 2020-12-29 MED ORDER — ALBUTEROL SULFATE (2.5 MG/3ML) 0.083% IN NEBU: 2.5000 mg | INHALATION_SOLUTION | RESPIRATORY_TRACT | Status: DC | PRN

## 2020-12-29 NOTE — Plan of Care (Signed)

## 2020-12-29 NOTE — H&P (Signed)
TRH H&P    Patient Demographics:    Navika Hoopes, is a 55 y.o. female  MRN: [id number]  DOB - 1965-08-25  Admit Date - 12/28/2020  Referring MD/NP/PA: Melina Copa  Outpatient Primary MD for the patient is Kathyrn Drown, MD  Patient coming from: Home  Chief complaint- Abdominal pain and nausea   HPI:    Maeven Mcdougall  is a 55 y.o. female, with history of anxiety, asthma, GAD, GERD, tobacco use disorder and more presents to the ED with a c/c of abdominal pain.  Patient reports that the symptoms started at midnight on the day of presentation.  She reports watery diarrhea every 90 minutes.  She has had no hematochezia or melena.  She reports nausea without emesis.  Last normal meal was 5 PM on the day prior to presentation.  She has had no sick contacts.  She does report some burning and regurgitation in the back of her mouth.  She used to be on acid reflux medication but asked her doctor to take her off of it.  She does not remember how long ago that was.  Patient reports she has abdominal pain that is cramping and twisting in nature.  It is constant.  Bowel movements do not improve or worsen the pain.  Patient reports that she gets flares like this.  She reports a partial obstruction but nothing seen on CT.  Patient reports that she has been worked up by GI at [facility], without any solution found.  Reports she cannot take Carafate or GI cocktail because she is not able to tolerate liquid medications.  She can drink water, but reports she has projectile vomiting with liquid medications and that is been the case since she was a child.  Patient denies any dysuria or hematuria.  Patient reports that up until this flare started she was in her normal state of health.  Patient currently smokes half a pack per day, but declines nicotine patch.  She does not drink alcohol, does not use illicit drugs.  She is vaccinated  for COVID.  Patient is full code.  In the ED Patient is afebrile, heart rate is 67-87, respiratory rate 17, normotensive and 119/83, satting 96% on room air No leukocytosis with a white blood cell count of 10.5, hemoglobin 16.8 Chemistry panel shows a slightly decreased bicarb-expected with diarrheal state CT abdomen shows enteritis.  No bowel obstruction.  Fatty liver.  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 challenge   Review of systems:    In addition to the HPI above,  No Fever-chills, No Headache, No changes with Vision or hearing, No problems swallowing food or Liquids, No Chest pain, Cough or Shortness of Breath, No Blood in stool or Urine, No dysuria, No new skin rashes or bruises, No new joints pains-aches,  No new weakness, tingling, numbness in any extremity, No recent weight gain  or loss, No polyuria, polydypsia or polyphagia, No significant Mental Stressors.  All other systems reviewed and are negative.    Past History of the following :    Past Medical History:  Diagnosis Date   Anxiety    Asthma    ONLY WITH BRONCHITIS   GAD (generalized anxiety disorder)    GERD (gastroesophageal reflux disease)    Microscopic hematuria 12/03/2017   Negative cystoscopy negative CAT scan   Pneumonia    PONV (postoperative nausea and vomiting)       Past Surgical History:  Procedure Laterality Date   AGILE CAPSULE N/A 07/09/2016   Procedure: AGILE CAPSULE;  Surgeon: Danie Binder, MD;  [facility];  Service: Endoscopy;  Laterality: N/A;  7:30am, pt to arrive at 7:00am   BIOPSY  09/19/2015   Procedure: BIOPSY;  Surgeon: Danie Binder, MD;  Location: [facility];  Service: Endoscopy;;  random colon bx's   COLONOSCOPY WITH PROPOFOL N/A 09/19/2015   Dr. Oneida Alar: five 2-3 mm polyps in  rectum (hyperplastic) and descending colon, diverticulosis in sigmoid colon and transverse colon. Query post-infectious IBS. Next colonoscopy in 10 years    CYST EXCISION N/A 01/20/2019   Procedure: EXCISION OF RIGHT GROIN SEBACEOUS CYST;  Surgeon: Erroll Luna, MD;  Location: [facility];  Service: General;  Laterality: N/A;   GIVENS CAPSULE STUDY N/A 07/30/2016   Procedure: GIVENS CAPSULE STUDY;  Surgeon: Danie Binder, MD;  [facility];  Service: Endoscopy;  Laterality: N/A;   HYSTEROSCOPY  2006   POLYPECTOMY  09/19/2015   Procedure: POLYPECTOMY;  Surgeon: Danie Binder, MD;  [facility];  Service: Endoscopy;;  descending colon polyps x3 cold bx, rectal polyp x2   SALPINGOOPHORECTOMY Bilateral 08/18/2012   Procedure: SALPINGO OOPHORECTOMY;  Surgeon: Anastasio Auerbach, MD;  Location: [facility];  Service: Gynecology;  Laterality: Bilateral;   VAGINAL HYSTERECTOMY N/A 08/18/2012   Procedure: HYSTERECTOMY VAGINAL;  Surgeon: Anastasio Auerbach, MD;  Location: [facility];  Service: Gynecology;  Laterality: N/A;      Social History:      Social History   Tobacco Use   Smoking status: Every Day    Packs/day: 1.00    Years: 25.00    Pack years: 25.00    Types: Cigarettes   Smokeless tobacco: Never  Substance Use Topics   Alcohol use: Not Currently    Alcohol/week: 0.0 standard drinks    Comment: rare       Family History :     Family History  Problem Relation Age of Onset   Heart failure Father    Stroke Father    Breast cancer Maternal Aunt        50's   Diabetes Paternal Grandfather    Hypertension Maternal Grandmother    Colon cancer Maternal Aunt    Heart attack Mother       Home Medications:   Prior to Admission medications   Medication Sig Start Date End Date Taking? Authorizing Provider  acetaminophen (TYLENOL) 325 MG tablet Take 2 tablets (650 mg total) by mouth every 6 (six) hours as needed for mild pain (or Fever >/= 101).  06/20/17  Yes Isaac Bliss, Rayford Halsted, MD  albuterol (VENTOLIN HFA) 108 (90 Base) MCG/ACT inhaler INHALE 2 PUFFS BY MOUTH INTO THE LUNGS EVERY 4 (FOUR) HOURS AS NEEDED FOR WHEEZING. 01/19/20 01/18/21 Yes Luking, Elayne Snare, MD  budesonide-formoterol (SYMBICORT) 160-4.5 MCG/ACT inhaler Inhale 2 puffs into the lungs  2 (two) times daily as directed 07/21/20  Yes   buPROPion (WELLBUTRIN XL) 150 MG 24 hr tablet TAKE 1 TABLET (150 MG TOTAL) BY MOUTH DAILY. Patient not taking: Reported on 12/28/2020 01/19/20 01/18/21  Kathyrn Drown, MD  FLUoxetine (PROZAC) 10 MG capsule Take 1 capsule (10 mg total) by mouth daily. Patient not taking: Reported on 12/28/2020 05/29/20   Kathyrn Drown, MD     Allergies:    No Known Allergies   Physical Exam:   Vitals  Blood pressure 125/88, pulse 79, temperature 98.3 F (36.8 C), resp. rate 18, height 5\' 5"  (1.651 m), weight 79.4 kg, last menstrual period 06/12/2012, SpO2 97 %.   1.  General: Patient lying supine in bed, head of bed elevated no acute distress   2. Psychiatric: Alert and oriented x 3, mood and behavior normal for situation, pleasant and cooperative with exam   3. Neurologic: Speech and language are normal, face is symmetric, moves all 4 extremities voluntarily, at baseline without acute deficits on limited exam   4. HEENMT:  Head is atraumatic, normocephalic, pupils reactive to light, neck is supple, trachea is midline, mucous membranes are moist   5. Respiratory : Lungs are clear to auscultation bilaterally without wheezing, rhonchi, rales, no cyanosis, no increase in work of breathing or accessory muscle use   6. Cardiovascular : Heart rate normal, rhythm is regular, no murmurs, rubs or gallops, no peripheral edema, peripheral pulses palpated   7. Gastrointestinal:  Abdomen is soft, nondistended, tenderness to palpation of abdomen diffusely but most significantly in epigastric region,  bowel sounds active, no masses or organomegaly  palpated   8. Skin:  Skin is warm, dry and intact without rashes, acute lesions, or ulcers on limited exam   9.Musculoskeletal:  No acute deformities or trauma, no asymmetry in tone, no peripheral edema, peripheral pulses palpated, no tenderness to palpation in the extremities     Data Review:    CBC Recent Labs  Lab 12/28/20 1903  WBC 10.5  HGB 16.8*  HCT 51.8*  PLT 266  MCV 94.9  MCH 30.8  MCHC 32.4  RDW 13.2  LYMPHSABS 1.3  MONOABS 0.7  EOSABS 0.1  BASOSABS 0.0   ------------------------------------------------------------------------------------------------------------------  Results for orders placed or performed during the hospital encounter of 12/28/20 (from the past 48 hour(s))  Lipase, blood     Status: None   Collection Time: 12/28/20  7:03 PM  Result Value Ref Range   Lipase 30 11 - 51 U/L    Comment: Performed at [facility], [address] [place], [place] [address]  Comprehensive metabolic panel     Status: Abnormal   Collection Time: 12/28/20  7:03 PM  Result Value Ref Range   Sodium 141 135 - 145 mmol/L   Potassium 4.0 3.5 - 5.1 mmol/L   Chloride 108 98 - 111 mmol/L   CO2 21 (L) 22 - 32 mmol/L   Glucose, Bld 109 (H) 70 - 99 mg/dL    Comment: Glucose reference range applies only to samples taken after fasting for at least 8 hours.   BUN 14 6 - 20 mg/dL   Creatinine, Ser 0.90 0.44 - 1.00 mg/dL   Calcium 9.2 8.9 - 10.3 mg/dL   Total Protein 7.7 6.5 - 8.1 g/dL   Albumin 4.2 3.5 - 5.0 g/dL   AST 22 15 - 41 U/L   ALT 37 0 - 44 U/L   Alkaline Phosphatase 68 38 - 126 U/L   Total  Bilirubin 0.6 0.3 - 1.2 mg/dL   GFR, Estimated >60 >60 mL/min    Comment: (NOTE) Calculated using the CKD-EPI Creatinine Equation (2021)    Anion gap 12 5 - 15    Comment: Performed at [facility], [address], [place], [place] [address]  CBC with Differential     Status: Abnormal   Collection Time: 12/28/20  7:03 PM  Result Value Ref Range   WBC 10.5 4.0 - 10.5 K/uL    RBC 5.46 (H) 3.87 - 5.11 MIL/uL   Hemoglobin 16.8 (H) 12.0 - 15.0 g/dL   HCT 51.8 (H) 36.0 - 46.0 %   MCV 94.9 80.0 - 100.0 fL   MCH 30.8 26.0 - 34.0 pg   MCHC 32.4 30.0 - 36.0 g/dL   RDW 13.2 11.5 - 15.5 %   Platelets 266 150 - 400 K/uL   nRBC 0.0 0.0 - 0.2 %   Neutrophils Relative % 80 %   Neutro Abs 8.3 (H) 1.7 - 7.7 K/uL   Lymphocytes Relative 12 %   Lymphs Abs 1.3 0.7 - 4.0 K/uL   Monocytes Relative 7 %   Monocytes Absolute 0.7 0.1 - 1.0 K/uL   Eosinophils Relative 1 %   Eosinophils Absolute 0.1 0.0 - 0.5 K/uL   Basophils Relative 0 %   Basophils Absolute 0.0 0.0 - 0.1 K/uL   Immature Granulocytes 0 %   Abs Immature Granulocytes 0.03 0.00 - 0.07 K/uL    Comment: Performed at [facility], [address], [place], [place] [address]  Resp Panel by RT-PCR (Flu A&B, Covid) Nasopharyngeal Swab     Status: None   Collection Time: 12/28/20 11:00 PM   Specimen: Nasopharyngeal Swab; Nasopharyngeal(NP) swabs in vial transport medium  Result Value Ref Range   SARS Coronavirus 2 by RT PCR NEGATIVE NEGATIVE    Comment: (NOTE) SARS-CoV-2 target nucleic acids are NOT DETECTED.  The SARS-CoV-2 RNA is generally detectable in upper respiratory specimens during the acute phase of infection. The lowest concentration of SARS-CoV-2 viral copies this assay can detect is 138 copies/mL. A negative result does not preclude SARS-Cov-2 infection and should not be used as the sole basis for treatment or other patient management decisions. A negative result may occur with  improper specimen collection/handling, submission of specimen other than nasopharyngeal swab, presence of viral mutation(s) within the areas targeted by this assay, and inadequate number of viral copies(<138 copies/mL). A negative result must be combined with clinical observations, patient history, and epidemiological information. The expected result is Negative.  Fact Sheet for Patients:   EntrepreneurPulse.com.au  Fact Sheet for Healthcare Providers:  IncredibleEmployment.be  This test is no t yet approved or cleared by the [place] FDA and  has been authorized for detection and/or diagnosis of SARS-CoV-2 by FDA under an Emergency Use Authorization (EUA). This EUA will remain  in effect (meaning this test can be used) for the duration of the COVID-19 declaration under Section 564(b)(1) of the Act, 21 U.S.C.section 360bbb-3(b)(1), unless the authorization is terminated  or revoked sooner.       Influenza A by PCR NEGATIVE NEGATIVE   Influenza B by PCR NEGATIVE NEGATIVE    Comment: (NOTE) The Xpert Xpress SARS-CoV-2/FLU/RSV plus assay is intended as an aid in the diagnosis of influenza from Nasopharyngeal swab specimens and should not be used as a sole basis for treatment. Nasal washings and aspirates are unacceptable for Xpert Xpress SARS-CoV-2/FLU/RSV testing.  Fact Sheet for Patients: EntrepreneurPulse.com.au  Fact Sheet for Healthcare  Providers: IncredibleEmployment.be  This test is not yet approved or cleared by the [place] and has been authorized for detection and/or diagnosis of SARS-CoV-2 by FDA under an Emergency Use Authorization (EUA). This EUA will remain in effect (meaning this test can be used) for the duration of the COVID-19 declaration under Section 564(b)(1) of the Act, 21 U.S.C. section 360bbb-3(b)(1), unless the authorization is terminated or revoked.  Performed at [facility], [address], [place], [place] [address]     Chemistries  Recent Labs  Lab 12/28/20 1903  NA 141  K 4.0  CL 108  CO2 21*  GLUCOSE 109*  BUN 14  CREATININE 0.90  CALCIUM 9.2  AST 22  ALT 37  ALKPHOS 68  BILITOT 0.6    ------------------------------------------------------------------------------------------------------------------  ------------------------------------------------------------------------------------------------------------------ GFR: Estimated Creatinine Clearance: 73.6 mL/min (by C-G formula based on SCr of 0.9 mg/dL). Liver Function Tests: Recent Labs  Lab 12/28/20 1903  AST 22  ALT 37  ALKPHOS 68  BILITOT 0.6  PROT 7.7  ALBUMIN 4.2   Recent Labs  Lab 12/28/20 1903  LIPASE 30   No results for input(s): AMMONIA in the last 168 hours. Coagulation Profile: No results for input(s): INR, PROTIME in the last 168 hours. Cardiac Enzymes: No results for input(s): CKTOTAL, CKMB, CKMBINDEX, TROPONINI in the last 168 hours. BNP (last 3 results) No results for input(s): PROBNP in the last 8760 hours. HbA1C: No results for input(s): HGBA1C in the last 72 hours. CBG: No results for input(s): GLUCAP in the last 168 hours. Lipid Profile: No results for input(s): CHOL, HDL, LDLCALC, TRIG, CHOLHDL, LDLDIRECT in the last 72 hours. Thyroid Function Tests: No results for input(s): TSH, T4TOTAL, FREET4, T3FREE, THYROIDAB in the last 72 hours. Anemia Panel: No results for input(s): VITAMINB12, FOLATE, FERRITIN, TIBC, IRON, RETICCTPCT in the last 72 hours.  --------------------------------------------------------------------------------------------------------------- Urine analysis:    Component Value Date/Time   COLORURINE YELLOW 06/19/2017 1019   APPEARANCEUR HAZY (A) 06/19/2017 1019   LABSPEC <=1.005 10/04/2017 1204   PHURINE 7.0 10/04/2017 1204   GLUCOSEU NEGATIVE 10/04/2017 1204   HGBUR NEGATIVE 10/04/2017 1204   BILIRUBINUR NEGATIVE 10/04/2017 1204   BILIRUBINUR + 07/01/2012 1002   KETONESUR NEGATIVE 10/04/2017 1204   PROTEINUR NEGATIVE 10/04/2017 1204   UROBILINOGEN 0.2 10/04/2017 1204   NITRITE NEGATIVE 10/04/2017 1204   LEUKOCYTESUR NEGATIVE 10/04/2017 1204       Imaging Results:    CT Abdomen Pelvis W Contrast  Result Date: 12/28/2020 CLINICAL DATA:  Abdominal pain. EXAM: CT ABDOMEN AND PELVIS WITH CONTRAST TECHNIQUE: Multidetector CT imaging of the abdomen and pelvis was performed using the standard protocol following bolus administration of intravenous contrast. CONTRAST:  163mL OMNIPAQUE IOHEXOL 300 MG/ML  SOLN COMPARISON:  CT abdomen pelvis dated 06/19/2017. FINDINGS: Lower chest: Minimal bibasilar atelectasis. The visualized lung bases are otherwise clear. No intra-abdominal free air or free fluid. Hepatobiliary: Fatty liver. No intrahepatic biliary dilatation. There is a 1.8 x 1.4 cm enhancing lesion in the dome of the liver better characterized on the MRI of 11/16/2018 and appears similar to the CT of 2018. The gallbladder is unremarkable. Pancreas: Unremarkable. No pancreatic ductal dilatation or surrounding inflammatory changes. Spleen: Normal in size without focal abnormality. Adrenals/Urinary Tract: The adrenal glands are unremarkable. The kidneys, visualized ureters, and urinary bladder appear unremarkable. Stomach/Bowel: There is diffuse thickened and inflamed appearance of the small bowel consistent with enteritis. There is loose stool throughout the colon compatible with diarrheal state. No bowel obstruction. The appendix is  normal. Vascular/Lymphatic: The abdominal aorta and IVC are unremarkable. No portal venous gas. There is no adenopathy. Reproductive: Hysterectomy.  No adnexal masses. Other: None Musculoskeletal: No acute or significant osseous findings. IMPRESSION: 1. Enteritis with diarrheal state. No bowel obstruction. Normal appendix. 2. Fatty liver. 3. Stable enhancing lesion in the dome of the liver suggested to represent and [place] on the MRI of 07/30/2017. Electronically Signed   By: Anner Crete M.D.   On: 12/28/2020 20:49      Assessment & Plan:    Principal Problem:   Intractable abdominal pain Active Problems:   Enteritis    Tobacco abuse   Asthma   Intractable abdominal pain 2/2  2. enteritis  Reports that this feels like her other "flares" Enteritis and diarrheal state on CT scan Famotidine, Dilaudid, Zofran, 1 L NaCL in ED Failed PO challenge No indication for antibiotics at this time Continue pain control with PRN oxy Patient is refusing GI cocktail and carafate due to intolerance Continue to monitor Tobacco use disorder Smokes 1/2 pack per day Declines nicotine patch at this time Counseled on the importance of cessation Ashtma Continue albuterol as needed Continue Dulera    DVT Prophylaxis-   Heparin - SCDs   AM Labs Ordered, also please review Full Orders  Family Communication: No family at bedside  Code Status:  FULL  Admission status: Observation Disposition: Anticipated Discharge 24-48hrs  Discharge to home  Time spent in minutes : Har[place] DO

## 2020-12-29 NOTE — Progress Notes (Signed)
Patient seen and examined.  Admitted early morning hours by nighttime hospitalist.  No major medical issues, episodic abdominal pain presented with 2 days of diffuse abdominal pain, poor appetite and multiple episodes of loose watery diarrhea with no preceding events.  CT scan consistent with enteritis.  WC count is normal.  No recent antibiotic use.  Acute gastroenteritis: Continue IV hydration.  Encourage oral intake.  Will advance to full liquid diet to challenge her if she can tolerate oral intake in anticipation of discharge tomorrow.  No charge visit.

## 2020-12-30 DIAGNOSIS — Z7951 Long term (current) use of inhaled steroids: Secondary | ICD-10-CM | POA: Diagnosis not present

## 2020-12-30 DIAGNOSIS — Z20822 Contact with and (suspected) exposure to covid-19: Secondary | ICD-10-CM | POA: Diagnosis not present

## 2020-12-30 DIAGNOSIS — J45909 Unspecified asthma, uncomplicated: Secondary | ICD-10-CM | POA: Diagnosis not present

## 2020-12-30 DIAGNOSIS — R109 Unspecified abdominal pain: Secondary | ICD-10-CM | POA: Diagnosis not present

## 2020-12-30 DIAGNOSIS — K529 Noninfective gastroenteritis and colitis, unspecified: Secondary | ICD-10-CM | POA: Diagnosis not present

## 2020-12-30 DIAGNOSIS — F17210 Nicotine dependence, cigarettes, uncomplicated: Secondary | ICD-10-CM | POA: Diagnosis not present

## 2020-12-30 MED ORDER — LOPERAMIDE HCL 2 MG PO CAPS
2.0000 mg | ORAL_CAPSULE | Freq: Once | ORAL | Status: AC
  Administered 2020-12-30: 2 mg via ORAL
  Filled 2020-12-30: qty 1

## 2020-12-30 MED ORDER — ONDANSETRON HCL 4 MG PO TABS: 4.0000 mg | ORAL_TABLET | Freq: Three times a day (TID) | ORAL | 0 refills | Status: AC | PRN

## 2020-12-30 MED ORDER — LOPERAMIDE HCL 2 MG PO CAPS: 2.0000 mg | ORAL_CAPSULE | Freq: Four times a day (QID) | ORAL | 0 refills | Status: DC | PRN

## 2020-12-30 NOTE — Discharge Summary (Signed)
Physician Discharge Summary  Ruth Robertson IRJ:[id number] DOB: [birth year] DOA: 12/28/2020  PCP: Kathyrn Drown, MD  Admit date: 12/28/2020 Discharge date: 12/30/2020  Admitted From: Home Disposition: Home  Recommendations for Outpatient Follow-up:  Follow up with PCP in 1-2 weeks Please obtain BMP/CBC in one week   Home Health: N/A Equipment/Devices: N/A  Discharge Condition: Stable CODE STATUS: Full code Diet recommendation: Regular diet.  Plenty of water and liquids along with electrolytes.  Discharge summary: Patient admitted with diffuse abdominal pain, nausea and multiple episodes of loose watery diarrhea.  Electrolytes are normal.  Challenged with regular diet and she tolerated well today.  Able to maintain hydration by mouth.  Still has frequent watery diarrhea which is likely because of viral gastroenteritis.  WBC count is normal.  No recent antibiotic use.  Since is able to maintain oral hydration, she will be going home.  She can use Imodium for diarrhea and Zofran for nausea.  Patient had a CT scan on arrival that was negative for any bowel obstruction, it did show evidence of enteritis.   Discharge Diagnoses:  Principal Problem:   Intractable abdominal pain Active Problems:   Enteritis   Tobacco abuse   Asthma    Discharge Instructions  Discharge Instructions     Call MD for:  persistant nausea and vomiting   Complete by: As directed    Call MD for:  severe uncontrolled pain   Complete by: As directed    Diet general   Complete by: As directed    Discharge instructions   Complete by: As directed    Drink plenty of water.  Prefer electrolyte water including Gatorade. Can use Imodium 3-4 times in a day.   Increase activity slowly   Complete by: As directed       Allergies as of 12/30/2020   No Known Allergies      Medication List     STOP taking these medications    buPROPion 150 MG 24 hr tablet Commonly known as: WELLBUTRIN XL    FLUoxetine 10 MG capsule Commonly known as: PROzac       TAKE these medications    acetaminophen 325 MG tablet Commonly known as: TYLENOL Take 2 tablets (650 mg total) by mouth every 6 (six) hours as needed for mild pain (or Fever >/= 101).   albuterol 108 (90 Base) MCG/ACT inhaler Commonly known as: VENTOLIN HFA INHALE 2 PUFFS BY MOUTH INTO THE LUNGS EVERY 4 (FOUR) HOURS AS NEEDED FOR WHEEZING.   loperamide 2 MG capsule Commonly known as: IMODIUM Take 1 capsule (2 mg total) by mouth every 6 (six) hours as needed for diarrhea or loose stools.   ondansetron 4 MG tablet Commonly known as: Zofran Take 1 tablet (4 mg total) by mouth every 8 (eight) hours as needed for up to 5 days for nausea or vomiting.   Symbicort 160-4.5 MCG/ACT inhaler Generic drug: budesonide-formoterol Inhale 2 puffs into the lungs 2 (two) times daily as directed        Follow-up Information     Luking, Elayne Snare, MD Follow up in 1 week(s).   Specialty: Family Medicine Contact information: [address] [place] [address] [phone]                No Known Allergies  Consultations: None   Procedures/Studies: CT Abdomen Pelvis W Contrast  Result Date: 12/28/2020 CLINICAL DATA:  Abdominal pain. EXAM: CT ABDOMEN AND PELVIS WITH CONTRAST TECHNIQUE: Multidetector CT imaging of  the abdomen and pelvis was performed using the standard protocol following bolus administration of intravenous contrast. CONTRAST:  112mL OMNIPAQUE IOHEXOL 300 MG/ML  SOLN COMPARISON:  CT abdomen pelvis dated 06/19/2017. FINDINGS: Lower chest: Minimal bibasilar atelectasis. The visualized lung bases are otherwise clear. No intra-abdominal free air or free fluid. Hepatobiliary: Fatty liver. No intrahepatic biliary dilatation. There is a 1.8 x 1.4 cm enhancing lesion in the dome of the liver better characterized on the MRI of 11/16/2018 and appears similar to the CT of 2018. The gallbladder is unremarkable.  Pancreas: Unremarkable. No pancreatic ductal dilatation or surrounding inflammatory changes. Spleen: Normal in size without focal abnormality. Adrenals/Urinary Tract: The adrenal glands are unremarkable. The kidneys, visualized ureters, and urinary bladder appear unremarkable. Stomach/Bowel: There is diffuse thickened and inflamed appearance of the small bowel consistent with enteritis. There is loose stool throughout the colon compatible with diarrheal state. No bowel obstruction. The appendix is normal. Vascular/Lymphatic: The abdominal aorta and IVC are unremarkable. No portal venous gas. There is no adenopathy. Reproductive: Hysterectomy.  No adnexal masses. Other: None Musculoskeletal: No acute or significant osseous findings. IMPRESSION: 1. Enteritis with diarrheal state. No bowel obstruction. Normal appendix. 2. Fatty liver. 3. Stable enhancing lesion in the dome of the liver suggested to represent and [place] on the MRI of 07/30/2017. Electronically Signed   By: Anner Crete M.D.   On: 12/28/2020 20:49   (Echo, Carotid, EGD, Colonoscopy, ERCP)    Subjective: Patient seen and examined.  By afternoon she was able to eat her lunch.  Has mild diffuse abdominal pain.  Had 3 episodes of loose watery stools since today morning.  Denies any nausea vomiting.  Comfortable to go home.   Discharge Exam: Vitals:   12/29/20 2141 12/30/20 0312  BP: 137/82 116/74  Pulse: 81 64  Resp:  18  Temp: 98.5 F (36.9 C) 98 F (36.7 C)  SpO2: 96% 97%   Vitals:   12/29/20 0816 12/29/20 1405 12/29/20 2141 12/30/20 0312  BP:  126/85 137/82 116/74  Pulse:  84 81 64  Resp:  18  18  Temp:  97.6 F (36.4 C) 98.5 F (36.9 C) 98 F (36.7 C)  TempSrc:  Oral Oral   SpO2: 97% 94% 96% 97%  Weight:      Height:        General: Pt is alert, awake, not in acute distress Cardiovascular: RRR, S1/S2 +, no rubs, no gallops Respiratory: CTA bilaterally, no wheezing, no rhonchi Abdominal: Soft, mild diffuse tenderness  without rigidity or guarding, ND, bowel sounds + Extremities: no edema, no cyanosis    The results of significant diagnostics from this hospitalization (including imaging, microbiology, ancillary and laboratory) are listed below for reference.     Microbiology: Recent Results (from the past 240 hour(s))  Resp Panel by RT-PCR (Flu A&B, Covid) Nasopharyngeal Swab     Status: None   Collection Time: 12/28/20 11:00 PM   Specimen: Nasopharyngeal Swab; Nasopharyngeal(NP) swabs in vial transport medium  Result Value Ref Range Status   SARS Coronavirus 2 by RT PCR NEGATIVE NEGATIVE Final    Comment: (NOTE) SARS-CoV-2 target nucleic acids are NOT DETECTED.  The SARS-CoV-2 RNA is generally detectable in upper respiratory specimens during the acute phase of infection. The lowest concentration of SARS-CoV-2 viral copies this assay can detect is 138 copies/mL. A negative result does not preclude SARS-Cov-2 infection and should not be used as the sole basis for treatment or other patient management decisions. A negative result may  occur with  improper specimen collection/handling, submission of specimen other than nasopharyngeal swab, presence of viral mutation(s) within the areas targeted by this assay, and inadequate number of viral copies(<138 copies/mL). A negative result must be combined with clinical observations, patient history, and epidemiological information. The expected result is Negative.  Fact Sheet for Patients:  EntrepreneurPulse.com.au  Fact Sheet for Healthcare Providers:  IncredibleEmployment.be  This test is no t yet approved or cleared by the [place] FDA and  has been authorized for detection and/or diagnosis of SARS-CoV-2 by FDA under an Emergency Use Authorization (EUA). This EUA will remain  in effect (meaning this test can be used) for the duration of the COVID-19 declaration under Section 564(b)(1) of the Act,  21 U.S.C.section 360bbb-3(b)(1), unless the authorization is terminated  or revoked sooner.       Influenza A by PCR NEGATIVE NEGATIVE Final   Influenza B by PCR NEGATIVE NEGATIVE Final    Comment: (NOTE) The Xpert Xpress SARS-CoV-2/FLU/RSV plus assay is intended as an aid in the diagnosis of influenza from Nasopharyngeal swab specimens and should not be used as a sole basis for treatment. Nasal washings and aspirates are unacceptable for Xpert Xpress SARS-CoV-2/FLU/RSV testing.  Fact Sheet for Patients: EntrepreneurPulse.com.au  Fact Sheet for Healthcare Providers: IncredibleEmployment.be  This test is not yet approved or cleared by the [place] FDA and has been authorized for detection and/or diagnosis of SARS-CoV-2 by FDA under an Emergency Use Authorization (EUA). This EUA will remain in effect (meaning this test can be used) for the duration of the COVID-19 declaration under Section 564(b)(1) of the Act, 21 U.S.C. section 360bbb-3(b)(1), unless the authorization is terminated or revoked.  Performed at [facility], [address], [place], [place] [address]      Labs: BNP (last 3 results) No results for input(s): BNP in the last 8760 hours. Basic Metabolic Panel: Recent Labs  Lab 12/28/20 1903 12/29/20 0517  NA 141 142  K 4.0 4.1  CL 108 110  CO2 21* 22  GLUCOSE 109* 112*  BUN 14 12  CREATININE 0.90 0.88  CALCIUM 9.2 8.4*  MG  --  2.0   Liver Function Tests: Recent Labs  Lab 12/28/20 1903 12/29/20 0517  AST 22 19  ALT 37 28  ALKPHOS 68 57  BILITOT 0.6 0.8  PROT 7.7 6.5  ALBUMIN 4.2 3.5   Recent Labs  Lab 12/28/20 1903  LIPASE 30   No results for input(s): AMMONIA in the last 168 hours. CBC: Recent Labs  Lab 12/28/20 1903 12/29/20 0517  WBC 10.5 6.6  NEUTROABS 8.3* 4.9  HGB 16.8* 15.4*  HCT 51.8* 48.5*  MCV 94.9 95.3  PLT 266 265   Cardiac Enzymes: No results for input(s): CKTOTAL, CKMB,  CKMBINDEX, TROPONINI in the last 168 hours. BNP: Invalid input(s): POCBNP CBG: No results for input(s): GLUCAP in the last 168 hours. D-Dimer No results for input(s): DDIMER in the last 72 hours. Hgb A1c No results for input(s): HGBA1C in the last 72 hours. Lipid Profile No results for input(s): CHOL, HDL, LDLCALC, TRIG, CHOLHDL, LDLDIRECT in the last 72 hours. Thyroid function studies No results for input(s): TSH, T4TOTAL, T3FREE, THYROIDAB in the last 72 hours.  Invalid input(s): FREET3 Anemia work up No results for input(s): VITAMINB12, FOLATE, FERRITIN, TIBC, IRON, RETICCTPCT in the last 72 hours. Urinalysis    Component Value Date/Time   COLORURINE YELLOW 06/19/2017 1019   APPEARANCEUR HAZY (A) 06/19/2017 1019   LABSPEC <=1.005 10/04/2017 1204  PHURINE 7.0 10/04/2017 1204   GLUCOSEU NEGATIVE 10/04/2017 1204   HGBUR NEGATIVE 10/04/2017 1204   BILIRUBINUR NEGATIVE 10/04/2017 1204   BILIRUBINUR + 07/01/2012 1002   KETONESUR NEGATIVE 10/04/2017 1204   PROTEINUR NEGATIVE 10/04/2017 1204   UROBILINOGEN 0.2 10/04/2017 1204   NITRITE NEGATIVE 10/04/2017 1204   LEUKOCYTESUR NEGATIVE 10/04/2017 1204   Sepsis Labs Invalid input(s): PROCALCITONIN,  WBC,  LACTICIDVEN Microbiology Recent Results (from the past 240 hour(s))  Resp Panel by RT-PCR (Flu A&B, Covid) Nasopharyngeal Swab     Status: None   Collection Time: 12/28/20 11:00 PM   Specimen: Nasopharyngeal Swab; Nasopharyngeal(NP) swabs in vial transport medium  Result Value Ref Range Status   SARS Coronavirus 2 by RT PCR NEGATIVE NEGATIVE Final    Comment: (NOTE) SARS-CoV-2 target nucleic acids are NOT DETECTED.  The SARS-CoV-2 RNA is generally detectable in upper respiratory specimens during the acute phase of infection. The lowest concentration of SARS-CoV-2 viral copies this assay can detect is 138 copies/mL. A negative result does not preclude SARS-Cov-2 infection and should not be used as the sole basis for  treatment or other patient management decisions. A negative result may occur with  improper specimen collection/handling, submission of specimen other than nasopharyngeal swab, presence of viral mutation(s) within the areas targeted by this assay, and inadequate number of viral copies(<138 copies/mL). A negative result must be combined with clinical observations, patient history, and epidemiological information. The expected result is Negative.  Fact Sheet for Patients:  EntrepreneurPulse.com.au  Fact Sheet for Healthcare Providers:  IncredibleEmployment.be  This test is no t yet approved or cleared by the [place] FDA and  has been authorized for detection and/or diagnosis of SARS-CoV-2 by FDA under an Emergency Use Authorization (EUA). This EUA will remain  in effect (meaning this test can be used) for the duration of the COVID-19 declaration under Section 564(b)(1) of the Act, 21 U.S.C.section 360bbb-3(b)(1), unless the authorization is terminated  or revoked sooner.       Influenza A by PCR NEGATIVE NEGATIVE Final   Influenza B by PCR NEGATIVE NEGATIVE Final    Comment: (NOTE) The Xpert Xpress SARS-CoV-2/FLU/RSV plus assay is intended as an aid in the diagnosis of influenza from Nasopharyngeal swab specimens and should not be used as a sole basis for treatment. Nasal washings and aspirates are unacceptable for Xpert Xpress SARS-CoV-2/FLU/RSV testing.  Fact Sheet for Patients: EntrepreneurPulse.com.au  Fact Sheet for Healthcare Providers: IncredibleEmployment.be  This test is not yet approved or cleared by the [place] FDA and has been authorized for detection and/or diagnosis of SARS-CoV-2 by FDA under an Emergency Use Authorization (EUA). This EUA will remain in effect (meaning this test can be used) for the duration of the COVID-19 declaration under Section 564(b)(1) of the Act, 21  U.S.C. section 360bbb-3(b)(1), unless the authorization is terminated or revoked.  Performed at [facility], [address], [place], [place] [address]      Time coordinating discharge:  32 minutes  SIGNED:   Barb Merino, MD  [facility] 12/30/2020, 1:46 PM

## 2020-12-30 NOTE — Progress Notes (Signed)
Nsg Discharge Note  Admit Date:  12/28/2020 Discharge date: 12/30/2020   Serafina Mitchell Pennington-Walker to be D/C'd Home per MD order.  AVS completed.   Patient/caregiver able to verbalize understanding.  Discharge Medication: Allergies as of 12/30/2020   No Known Allergies      Medication List     STOP taking these medications    buPROPion 150 MG 24 hr tablet Commonly known as: WELLBUTRIN XL   FLUoxetine 10 MG capsule Commonly known as: PROzac       TAKE these medications    acetaminophen 325 MG tablet Commonly known as: TYLENOL Take 2 tablets (650 mg total) by mouth every 6 (six) hours as needed for mild pain (or Fever >/= 101).   albuterol 108 (90 Base) MCG/ACT inhaler Commonly known as: VENTOLIN HFA INHALE 2 PUFFS BY MOUTH INTO THE LUNGS EVERY 4 (FOUR) HOURS AS NEEDED FOR WHEEZING.   loperamide 2 MG capsule Commonly known as: IMODIUM Take 1 capsule (2 mg total) by mouth every 6 (six) hours as needed for diarrhea or loose stools.   ondansetron 4 MG tablet Commonly known as: Zofran Take 1 tablet (4 mg total) by mouth every 8 (eight) hours as needed for up to 5 days for nausea or vomiting.   Symbicort 160-4.5 MCG/ACT inhaler Generic drug: budesonide-formoterol Inhale 2 puffs into the lungs 2 (two) times daily as directed        Discharge Assessment: Vitals:   12/30/20 0312 12/30/20 1354  BP: 116/74 122/86  Pulse: 64 68  Resp: 18 18  Temp: 98 F (36.7 C) 98.1 F (36.7 C)  SpO2: 97% 96%   Skin clean, dry and intact without evidence of skin break down, no evidence of skin tears noted. IV catheter discontinued intact. Site without signs and symptoms of complications - no redness or edema noted at insertion site, patient denies c/o pain - only slight tenderness at site.  Dressing with slight pressure applied.  D/c Instructions-Education: Discharge instructions given to patient/family with verbalized understanding. D/c education completed with patient/family  including follow up instructions, medication list, d/c activities limitations if indicated, with other d/c instructions as indicated by MD - patient able to verbalize understanding, all questions fully answered. Patient instructed to return to ED, call 911, or call MD for any changes in condition.  Patient escorted via [place], and D/C home via private auto.  Kathie Rhodes, RN 12/30/2020 1:58 PM

## 2021-01-02 DIAGNOSIS — H524 Presbyopia: Secondary | ICD-10-CM | POA: Diagnosis not present

## 2021-01-02 DIAGNOSIS — H25813 Combined forms of age-related cataract, bilateral: Secondary | ICD-10-CM | POA: Diagnosis not present

## 2021-01-02 DIAGNOSIS — H5203 Hypermetropia, bilateral: Secondary | ICD-10-CM | POA: Diagnosis not present

## 2021-01-02 DIAGNOSIS — H52203 Unspecified astigmatism, bilateral: Secondary | ICD-10-CM | POA: Diagnosis not present

## 2021-01-08 ENCOUNTER — Telehealth: Payer: Self-pay | Admitting: Family Medicine

## 2021-01-08 NOTE — Telephone Encounter (Signed)
Patient was in the hospital over Thanksgiving. I am not sure where to schedule please advise.  CB# [phone]

## 2021-01-09 NOTE — Telephone Encounter (Signed)
Please contact pt to set her up on Friday. Thank you

## 2021-01-09 NOTE — Telephone Encounter (Signed)
May use Friday afternoon appointment

## 2021-01-12 ENCOUNTER — Other Ambulatory Visit: Payer: Self-pay

## 2021-01-12 ENCOUNTER — Encounter: Payer: Self-pay | Admitting: Family Medicine

## 2021-01-12 ENCOUNTER — Ambulatory Visit (INDEPENDENT_AMBULATORY_CARE_PROVIDER_SITE_OTHER): Payer: 59 | Admitting: Family Medicine

## 2021-01-12 VITALS — BP 115/82 | HR 75 | Temp 97.3°F | Ht 65.0 in | Wt 170.0 lb

## 2021-01-12 DIAGNOSIS — K529 Noninfective gastroenteritis and colitis, unspecified: Secondary | ICD-10-CM | POA: Diagnosis not present

## 2021-01-12 NOTE — Progress Notes (Signed)
   Subjective:    Patient ID: Ruth Robertson, female    DOB: 1965/11/29, 55 y.o.   MRN: [id number]  [facility] follow up nausea and vomiting flare up since thanksgiving Eating and drinking ok , currently nausea no vomiting She started having significant abdominal pain nausea some diarrhea as well.  Ended up having to go to the emergency department CAT scan showed enteritis.  Patient was in the hospital for several days.  She went home and she nearly had to go back after a flareup again she now complains of aching and discomfort throughout the abdomen no bloody stools.  No fever sweats or night chills.  Review of Systems     Objective:   Physical Exam Lungs are clear hearts regular pulse normal abdomen soft no guarding rebound or tenderness       Assessment & Plan:  Enteritis Lab work ordered May need follow-up with gastroenterology She would prefer to go to [facility] to be seen FODMAP diet would be preferred currently If progressive troubles or worse to notify us immediately Await lab work results

## 2021-01-13 LAB — CBC WITH DIFFERENTIAL/PLATELET
Basophils Absolute: 0.1 10*3/uL (ref 0.0–0.2)
Basos: 1 %
EOS (ABSOLUTE): 0.3 10*3/uL (ref 0.0–0.4)
Eos: 4 %
Hematocrit: 43.3 % (ref 34.0–46.6)
Hemoglobin: 14.5 g/dL (ref 11.1–15.9)
Immature Grans (Abs): 0 10*3/uL (ref 0.0–0.1)
Immature Granulocytes: 0 %
Lymphocytes Absolute: 1.9 10*3/uL (ref 0.7–3.1)
Lymphs: 27 %
MCH: 30.1 pg (ref 26.6–33.0)
MCHC: 33.5 g/dL (ref 31.5–35.7)
MCV: 90 fL (ref 79–97)
Monocytes Absolute: 0.6 10*3/uL (ref 0.1–0.9)
Monocytes: 9 %
Neutrophils Absolute: 4 10*3/uL (ref 1.4–7.0)
Neutrophils: 59 %
Platelets: 349 10*3/uL (ref 150–450)
RBC: 4.82 x10E6/uL (ref 3.77–5.28)
RDW: 12.4 % (ref 11.7–15.4)
WBC: 6.8 10*3/uL (ref 3.4–10.8)

## 2021-01-14 ENCOUNTER — Encounter: Payer: Self-pay | Admitting: Family Medicine

## 2021-01-14 LAB — SEDIMENTATION RATE: Sed Rate: 13 mm/hr (ref 0–40)

## 2021-01-14 LAB — TISSUE TRANSGLUTAMINASE, IGA: Transglutaminase IgA: <2 U/mL (ref 0–3)

## 2021-01-14 LAB — C-REACTIVE PROTEIN: CRP: 2 mg/L (ref 0–10)

## 2021-01-15 ENCOUNTER — Other Ambulatory Visit: Payer: Self-pay

## 2021-01-15 DIAGNOSIS — K529 Noninfective gastroenteritis and colitis, unspecified: Secondary | ICD-10-CM

## 2021-01-15 NOTE — Telephone Encounter (Signed)
If she is interested in having more sent in-I would recommend Zofran 8 mg tablet 1 taken 3 times daily as needed for nausea #15 with 2 refills  This is the same as her current medicine but stronger If she does not want more sent in that does not have to be sent in

## 2021-01-22 MED ORDER — DICYCLOMINE HCL 10 MG PO CAPS: 10.0000 mg | ORAL_CAPSULE | Freq: Three times a day (TID) | ORAL | 2 refills | Status: DC | PRN

## 2021-01-22 NOTE — Telephone Encounter (Signed)
Nurses Make sure she has enough Zofran for nausea If she needs more refills she may have 6 refills I would also recommend dicyclomine 10 mg 1 taken 3 times daily as needed for abdominal cramps, #30 with 2 refills We also sent a referral to [facility] gastroenterology. I do not see anything back on this at this time Please make sure Ruth Robertson could resend this.  Urgent referral.  Patient has been seen locally previously but would like to have her gastroenterologist to be within the [facility] system in [place].  Therefore [facility].  If she has further flareups to please follow-up as well

## 2021-01-22 NOTE — Telephone Encounter (Signed)
Patient stated she is not vomiting currently but still has diarrhea. Patient would like script for abdominal cramps and scheduled follow up with Dr Nicki Reaper 01/24/21- ER if worse.  Prescription sent electronically to pharmacy.

## 2021-01-24 ENCOUNTER — Encounter: Payer: Self-pay | Admitting: Family Medicine

## 2021-01-24 ENCOUNTER — Other Ambulatory Visit: Payer: Self-pay

## 2021-01-24 ENCOUNTER — Ambulatory Visit (INDEPENDENT_AMBULATORY_CARE_PROVIDER_SITE_OTHER): Payer: 59 | Admitting: Family Medicine

## 2021-01-24 VITALS — BP 130/82 | Temp 97.9°F | Wt 168.8 lb

## 2021-01-24 DIAGNOSIS — K529 Noninfective gastroenteritis and colitis, unspecified: Secondary | ICD-10-CM | POA: Diagnosis not present

## 2021-01-24 MED ORDER — PROMETHAZINE HCL 25 MG PO TABS: 25.0000 mg | ORAL_TABLET | Freq: Three times a day (TID) | ORAL | 2 refills | Status: DC | PRN

## 2021-01-24 MED ORDER — ONDANSETRON HCL 8 MG PO TABS: 8.0000 mg | ORAL_TABLET | Freq: Three times a day (TID) | ORAL | 2 refills | Status: DC | PRN

## 2021-01-24 NOTE — Progress Notes (Signed)
° °  Subjective:    Patient ID: Ruth Robertson, female    DOB: Jan 12, 1966, 55 y.o.   MRN: [id number]  HPI Pt having abdominal pain/soreness. Had vomiting and diarrhea over the weekend. Decreased appetite. Pt states she is feeling better today. No v/d today. Pt states she has had 3 episodes of this since Thanksgiving.    Review of Systems     Objective:   Physical Exam  Lungs clear heart regular abdomen soft no guarding rebound or tenderness      Assessment & Plan:   Enteritis Need to rule out possibility of autoimmune Very beneficial for this patient to see gastroenterology Patient would benefit from intervention Lab work at the minimum Today Would benefit from seeing a gastroenterologist in [place].  She works with [employer].  Previously she saw a doctor at [facility] but to follow-up with them with entail a very high cost

## 2021-01-26 LAB — COMPREHENSIVE METABOLIC PANEL
ALT: 27 IU/L (ref 0–32)
AST: 20 IU/L (ref 0–40)
Albumin/Globulin Ratio: 1.8 (ref 1.2–2.2)
Albumin: 4.5 g/dL (ref 3.8–4.9)
Alkaline Phosphatase: 72 IU/L (ref 44–121)
BUN/Creatinine Ratio: 14 (ref 9–23)
BUN: 13 mg/dL (ref 6–24)
Bilirubin Total: <0.2 mg/dL (ref 0.0–1.2)
CO2: 25 mmol/L (ref 20–29)
Calcium: 9.4 mg/dL (ref 8.7–10.2)
Chloride: 105 mmol/L (ref 96–106)
Creatinine, Ser: 0.95 mg/dL (ref 0.57–1.00)
Globulin, Total: 2.5 g/dL (ref 1.5–4.5)
Glucose: 91 mg/dL (ref 70–99)
Potassium: 4.4 mmol/L (ref 3.5–5.2)
Sodium: 144 mmol/L (ref 134–144)
Total Protein: 7 g/dL (ref 6.0–8.5)
eGFR: 71 mL/min/{1.73_m2} (ref >59)

## 2021-01-26 LAB — ANTI-SMOOTH MUSCLE ANTIBODY, IGG: Smooth Muscle Ab: 8 Units (ref 0–19)

## 2021-01-26 LAB — CBC WITH DIFFERENTIAL/PLATELET
Basophils Absolute: 0.1 10*3/uL (ref 0.0–0.2)
Basos: 1 %
EOS (ABSOLUTE): 0.4 10*3/uL (ref 0.0–0.4)
Eos: 6 %
Hematocrit: 45.9 % (ref 34.0–46.6)
Hemoglobin: 15.3 g/dL (ref 11.1–15.9)
Immature Grans (Abs): 0 10*3/uL (ref 0.0–0.1)
Immature Granulocytes: 0 %
Lymphocytes Absolute: 2.1 10*3/uL (ref 0.7–3.1)
Lymphs: 27 %
MCH: 30.7 pg (ref 26.6–33.0)
MCHC: 33.3 g/dL (ref 31.5–35.7)
MCV: 92 fL (ref 79–97)
Monocytes Absolute: 0.6 10*3/uL (ref 0.1–0.9)
Monocytes: 7 %
Neutrophils Absolute: 4.6 10*3/uL (ref 1.4–7.0)
Neutrophils: 59 %
Platelets: 289 10*3/uL (ref 150–450)
RBC: 4.99 x10E6/uL (ref 3.77–5.28)
RDW: 12.6 % (ref 11.7–15.4)
WBC: 7.7 10*3/uL (ref 3.4–10.8)

## 2021-01-26 LAB — SEDIMENTATION RATE: Sed Rate: 5 mm/hr (ref 0–40)

## 2021-01-26 LAB — ANA: Anti Nuclear Antibody (ANA): NEGATIVE

## 2021-01-26 LAB — C3 AND C4
Complement C3, Serum: 146 mg/dL (ref 82–167)
Complement C4, Serum: 25 mg/dL (ref 12–38)

## 2021-01-26 LAB — LIPASE: Lipase: 59 U/L (ref 14–72)

## 2021-01-26 LAB — C-REACTIVE PROTEIN: CRP: 2 mg/L (ref 0–10)

## 2021-02-16 ENCOUNTER — Other Ambulatory Visit: Payer: Self-pay | Admitting: Family Medicine

## 2021-02-16 DIAGNOSIS — Z1231 Encounter for screening mammogram for malignant neoplasm of breast: Secondary | ICD-10-CM

## 2021-02-22 NOTE — Progress Notes (Signed)
56 y.o. G48P1001 Divorced White or Caucasian Not Hispanic or Latino female here for annual exam.  H/O TVH/BSO in 2014. Not on ERT. Not sexually active. No bladder issues.  Since 2017 she has had several hospitalizations with diarrhea and vomiting, one of the times she had a bowel obstruction. Last admitted in 11/22 with enteritis. Her primary has been trying to r/o autoimmune disorders. She is seeing GI.    Patient's last menstrual period was 06/12/2012.          Sexually active: No.  The current method of family planning is status post hysterectomy.    Exercising: No.  The patient has a physically strenuous job, but has no regular exercise apart from work.  Smoker:  yes 1/2 pack a day, stable. No current plans to quit.    Health Maintenance: Pap:  11/28/16 WNL  10/21/13 WNL  History of abnormal Pap:  h/o cryosurgery of her cervix ~29 years ago. MMG:  02/24/19 density B Bi-rads 1 neg, scheduled  BMD:   unsure  Colonoscopy: 07/18/17 f/u 10 years  TDaP:  08/27/18  Gardasil: na   reports that she has been smoking cigarettes. She has a 25.00 pack-year smoking history. She has never used smokeless tobacco. She reports that she does not currently use alcohol. She reports that she does not use drugs. She works as a Orthoptist for [employer]. Works part time at [employer]. Son is 49, had to have 1/2 of his foot amputated after a motorcycle accident.   Past Medical History:  Diagnosis Date   Anxiety    Asthma    ONLY WITH BRONCHITIS   GAD (generalized anxiety disorder)    GERD (gastroesophageal reflux disease)    Microscopic hematuria 12/03/2017   Negative cystoscopy negative CAT scan   Pneumonia    PONV (postoperative nausea and vomiting)     Past Surgical History:  Procedure Laterality Date   AGILE CAPSULE N/A 07/09/2016   Procedure: AGILE CAPSULE;  Surgeon: Danie Binder, MD;  [facility];  Service: Endoscopy;  Laterality: N/A;  7:30am, pt to arrive at 7:00am    BIOPSY  09/19/2015   Procedure: BIOPSY;  Surgeon: Danie Binder, MD;  Location: [facility];  Service: Endoscopy;;  random colon bx's   COLONOSCOPY WITH PROPOFOL N/A 09/19/2015   Dr. Oneida Alar: five 2-3 mm polyps in rectum (hyperplastic) and descending colon, diverticulosis in sigmoid colon and transverse colon. Query post-infectious IBS. Next colonoscopy in 10 years    CYST EXCISION N/A 01/20/2019   Procedure: EXCISION OF RIGHT GROIN SEBACEOUS CYST;  Surgeon: Erroll Luna, MD;  Location: [facility];  Service: General;  Laterality: N/A;   GIVENS CAPSULE STUDY N/A 07/30/2016   Procedure: GIVENS CAPSULE STUDY;  Surgeon: Danie Binder, MD;  [facility]: [facility];  Service: Endoscopy;  Laterality: N/A;   HYSTEROSCOPY  2006   POLYPECTOMY  09/19/2015   Procedure: POLYPECTOMY;  Surgeon: Danie Binder, MD;  [facility]: [facility];  Service: Endoscopy;;  descending colon polyps x3 cold bx, rectal polyp x2   SALPINGOOPHORECTOMY Bilateral 08/18/2012   Procedure: SALPINGO OOPHORECTOMY;  Surgeon: Anastasio Auerbach, MD;  Location: [facility];  Service: Gynecology;  Laterality: Bilateral;   VAGINAL HYSTERECTOMY N/A 08/18/2012   Procedure: HYSTERECTOMY VAGINAL;  Surgeon: Anastasio Auerbach, MD;  Location: [facility];  Service: Gynecology;  Laterality: N/A;    Current Outpatient Medications  Medication Sig Dispense Refill   acetaminophen (TYLENOL) 325 MG tablet Take  2 tablets (650 mg total) by mouth every 6 (six) hours as needed for mild pain (or Fever >/= 101).     dicyclomine (BENTYL) 10 MG capsule Take 1 capsule (10 mg total) by mouth 3 (three) times daily as needed for spasms. 30 capsule 2   loperamide (IMODIUM) 2 MG capsule Take 1 capsule (2 mg total) by mouth every 6 (six) hours as needed for diarrhea or loose stools. (Patient not taking: Reported on 02/23/2021) 30 capsule 0   ondansetron (ZOFRAN) 8 MG tablet Take 1 tablet (8 mg total) by mouth every 8 (eight) hours as needed for nausea.  (Patient not taking: Reported on 02/23/2021) 15 tablet 2   promethazine (PHENERGAN) 25 MG tablet Take 1 tablet (25 mg total) by mouth every 8 (eight) hours as needed for nausea. (Patient not taking: Reported on 02/23/2021) 21 tablet 2   No current facility-administered medications for this visit.    Family History  Problem Relation Age of Onset   Heart failure Father    Stroke Father    Breast cancer Maternal Aunt        50's   Diabetes Paternal Grandfather    Hypertension Maternal Grandmother    Colon cancer Maternal Aunt    Heart attack Mother     Review of Systems  Exam:   BP 110/64    Pulse 72    Ht 5\' 5"  (1.651 m)    Wt 171 lb (77.6 kg)    LMP 06/12/2012    SpO2 99%    BMI 28.46 kg/m   Weight change: @WEIGHTCHANGE @ Height:   Height: 5\' 5"  (165.1 cm)  Ht Readings from Last 3 Encounters:  02/23/21 5\' 5"  (1.651 m)  01/12/21 5\' 5"  (1.651 m)  12/29/20 5\' 5"  (1.651 m)    General appearance: alert, cooperative and appears stated age Head: Normocephalic, without obvious abnormality, atraumatic Neck: no adenopathy, supple, symmetrical, trachea midline and thyroid normal to inspection and palpation Lungs: clear to auscultation bilaterally Cardiovascular: regular rate and rhythm Breasts: normal appearance, no masses or tenderness Abdomen: soft, non-tender; non distended,  no masses,  no organomegaly Extremities: extremities normal, atraumatic, no cyanosis or edema Skin: Skin color, texture, turgor normal. No rashes or lesions Lymph nodes: Cervical, supraclavicular, and axillary nodes normal. No abnormal inguinal nodes palpated Neurologic: Grossly normal   Pelvic: External genitalia:  no lesions              Urethra:  normal appearing urethra with no masses, tenderness or lesions              Bartholins and Skenes: normal                 Vagina: atrophic appearing vagina with normal color and discharge, no lesions              Cervix: absent               Bimanual Exam:   Uterus:  uterus absent              Adnexa: no mass, fullness, tenderness               Rectovaginal: Confirms               Anus:  normal sphincter tone, no lesions  Gae Dry chaperoned for the exam.  1. Well woman exam Discussed breast self exam Discussed calcium and vit D intake Mammogram scheduled She thinks she had a DEXA, not in Epic, she  will double check with her primary. If not I will order one (early surgical menopause and a smoker)  2. Surgical menopause   3. Smoker Not ready to quit

## 2021-02-23 ENCOUNTER — Other Ambulatory Visit: Payer: Self-pay

## 2021-02-23 ENCOUNTER — Ambulatory Visit (INDEPENDENT_AMBULATORY_CARE_PROVIDER_SITE_OTHER): Payer: 59 | Admitting: Obstetrics and Gynecology

## 2021-02-23 ENCOUNTER — Other Ambulatory Visit (HOSPITAL_COMMUNITY)
Admission: RE | Admit: 2021-02-23 | Discharge: 2021-02-23 | Disposition: A | Discharge status: Home / Self Care | Payer: 59 | Source: Ambulatory Visit | Attending: Obstetrics and Gynecology | Admitting: Obstetrics and Gynecology

## 2021-02-23 ENCOUNTER — Encounter: Payer: Self-pay | Admitting: Obstetrics and Gynecology

## 2021-02-23 VITALS — BP 110/64 | HR 72 | Ht 65.0 in | Wt 171.0 lb

## 2021-02-23 DIAGNOSIS — Z01419 Encounter for gynecological examination (general) (routine) without abnormal findings: Secondary | ICD-10-CM

## 2021-02-23 DIAGNOSIS — Z1272 Encounter for screening for malignant neoplasm of vagina: Secondary | ICD-10-CM | POA: Diagnosis not present

## 2021-02-23 DIAGNOSIS — Z87410 Personal history of cervical dysplasia: Secondary | ICD-10-CM | POA: Insufficient documentation

## 2021-02-23 DIAGNOSIS — E8940 Asymptomatic postprocedural ovarian failure: Secondary | ICD-10-CM

## 2021-02-23 DIAGNOSIS — N809 Endometriosis, unspecified: Secondary | ICD-10-CM | POA: Insufficient documentation

## 2021-02-23 DIAGNOSIS — F17200 Nicotine dependence, unspecified, uncomplicated: Secondary | ICD-10-CM

## 2021-02-23 NOTE — Patient Instructions (Signed)

## 2021-02-26 ENCOUNTER — Other Ambulatory Visit: Payer: Self-pay

## 2021-02-26 ENCOUNTER — Inpatient Hospital Stay (HOSPITAL_COMMUNITY)
Admission: EM | Admit: 2021-02-26 | Discharge: 2021-03-02 | DRG: 388 | Disposition: A | Discharge status: Other Acute Inpatient Hospital | Payer: 59 | Attending: Internal Medicine | Admitting: Internal Medicine

## 2021-02-26 ENCOUNTER — Encounter (HOSPITAL_COMMUNITY): Payer: Self-pay

## 2021-02-26 DIAGNOSIS — M359 Systemic involvement of connective tissue, unspecified: Secondary | ICD-10-CM | POA: Diagnosis not present

## 2021-02-26 DIAGNOSIS — R1110 Vomiting, unspecified: Secondary | ICD-10-CM | POA: Diagnosis not present

## 2021-02-26 DIAGNOSIS — R188 Other ascites: Secondary | ICD-10-CM | POA: Diagnosis not present

## 2021-02-26 DIAGNOSIS — K760 Fatty (change of) liver, not elsewhere classified: Secondary | ICD-10-CM | POA: Diagnosis not present

## 2021-02-26 DIAGNOSIS — Z86010 Personal history of colonic polyps: Secondary | ICD-10-CM

## 2021-02-26 DIAGNOSIS — F411 Generalized anxiety disorder: Secondary | ICD-10-CM | POA: Diagnosis present

## 2021-02-26 DIAGNOSIS — J181 Lobar pneumonia, unspecified organism: Secondary | ICD-10-CM | POA: Diagnosis present

## 2021-02-26 DIAGNOSIS — R042 Hemoptysis: Secondary | ICD-10-CM | POA: Diagnosis not present

## 2021-02-26 DIAGNOSIS — K219 Gastro-esophageal reflux disease without esophagitis: Secondary | ICD-10-CM | POA: Diagnosis present

## 2021-02-26 DIAGNOSIS — K209 Esophagitis, unspecified without bleeding: Secondary | ICD-10-CM | POA: Diagnosis not present

## 2021-02-26 DIAGNOSIS — Z7952 Long term (current) use of systemic steroids: Secondary | ICD-10-CM | POA: Diagnosis not present

## 2021-02-26 DIAGNOSIS — R1084 Generalized abdominal pain: Secondary | ICD-10-CM | POA: Diagnosis not present

## 2021-02-26 DIAGNOSIS — R001 Bradycardia, unspecified: Secondary | ICD-10-CM | POA: Diagnosis not present

## 2021-02-26 DIAGNOSIS — Z79899 Other long term (current) drug therapy: Secondary | ICD-10-CM | POA: Diagnosis not present

## 2021-02-26 DIAGNOSIS — R109 Unspecified abdominal pain: Secondary | ICD-10-CM | POA: Diagnosis not present

## 2021-02-26 DIAGNOSIS — K769 Liver disease, unspecified: Secondary | ICD-10-CM | POA: Diagnosis not present

## 2021-02-26 DIAGNOSIS — R197 Diarrhea, unspecified: Secondary | ICD-10-CM | POA: Diagnosis not present

## 2021-02-26 DIAGNOSIS — J45909 Unspecified asthma, uncomplicated: Secondary | ICD-10-CM | POA: Diagnosis not present

## 2021-02-26 DIAGNOSIS — Z20822 Contact with and (suspected) exposure to covid-19: Secondary | ICD-10-CM | POA: Diagnosis present

## 2021-02-26 DIAGNOSIS — R7989 Other specified abnormal findings of blood chemistry: Secondary | ICD-10-CM | POA: Diagnosis not present

## 2021-02-26 DIAGNOSIS — K7689 Other specified diseases of liver: Secondary | ICD-10-CM | POA: Diagnosis not present

## 2021-02-26 DIAGNOSIS — K56600 Partial intestinal obstruction, unspecified as to cause: Principal | ICD-10-CM | POA: Diagnosis present

## 2021-02-26 DIAGNOSIS — K529 Noninfective gastroenteritis and colitis, unspecified: Secondary | ICD-10-CM

## 2021-02-26 DIAGNOSIS — R059 Cough, unspecified: Secondary | ICD-10-CM | POA: Diagnosis not present

## 2021-02-26 DIAGNOSIS — D124 Benign neoplasm of descending colon: Secondary | ICD-10-CM | POA: Diagnosis not present

## 2021-02-26 DIAGNOSIS — R0602 Shortness of breath: Secondary | ICD-10-CM | POA: Diagnosis not present

## 2021-02-26 DIAGNOSIS — K635 Polyp of colon: Secondary | ICD-10-CM | POA: Diagnosis not present

## 2021-02-26 DIAGNOSIS — Z720 Tobacco use: Secondary | ICD-10-CM | POA: Diagnosis present

## 2021-02-26 DIAGNOSIS — R112 Nausea with vomiting, unspecified: Secondary | ICD-10-CM | POA: Diagnosis not present

## 2021-02-26 DIAGNOSIS — R933 Abnormal findings on diagnostic imaging of other parts of digestive tract: Secondary | ICD-10-CM | POA: Diagnosis not present

## 2021-02-26 DIAGNOSIS — J189 Pneumonia, unspecified organism: Secondary | ICD-10-CM | POA: Diagnosis not present

## 2021-02-26 DIAGNOSIS — K259 Gastric ulcer, unspecified as acute or chronic, without hemorrhage or perforation: Secondary | ICD-10-CM | POA: Diagnosis not present

## 2021-02-26 DIAGNOSIS — F17210 Nicotine dependence, cigarettes, uncomplicated: Secondary | ICD-10-CM | POA: Diagnosis present

## 2021-02-26 DIAGNOSIS — K6389 Other specified diseases of intestine: Secondary | ICD-10-CM | POA: Diagnosis not present

## 2021-02-26 DIAGNOSIS — K449 Diaphragmatic hernia without obstruction or gangrene: Secondary | ICD-10-CM | POA: Diagnosis not present

## 2021-02-26 DIAGNOSIS — Z90710 Acquired absence of both cervix and uterus: Secondary | ICD-10-CM | POA: Diagnosis not present

## 2021-02-26 DIAGNOSIS — K2950 Unspecified chronic gastritis without bleeding: Secondary | ICD-10-CM | POA: Diagnosis not present

## 2021-02-26 DIAGNOSIS — K2960 Other gastritis without bleeding: Secondary | ICD-10-CM | POA: Diagnosis not present

## 2021-02-26 DIAGNOSIS — R0789 Other chest pain: Secondary | ICD-10-CM | POA: Diagnosis not present

## 2021-02-26 DIAGNOSIS — J9811 Atelectasis: Secondary | ICD-10-CM | POA: Diagnosis not present

## 2021-02-26 DIAGNOSIS — R140 Abdominal distension (gaseous): Secondary | ICD-10-CM

## 2021-02-26 DIAGNOSIS — N390 Urinary tract infection, site not specified: Secondary | ICD-10-CM | POA: Diagnosis present

## 2021-02-26 DIAGNOSIS — F419 Anxiety disorder, unspecified: Secondary | ICD-10-CM | POA: Diagnosis not present

## 2021-02-26 LAB — RESP PANEL BY RT-PCR (FLU A&B, COVID) ARPGX2
Influenza A by PCR: NEGATIVE
Influenza B by PCR: NEGATIVE
SARS Coronavirus 2 by RT PCR: NEGATIVE

## 2021-02-26 LAB — CBC WITH DIFFERENTIAL/PLATELET
Abs Immature Granulocytes: 0.04 10*3/uL (ref 0.00–0.07)
Basophils Absolute: 0 10*3/uL (ref 0.0–0.1)
Basophils Relative: 0 %
Eosinophils Absolute: 0.2 10*3/uL (ref 0.0–0.5)
Eosinophils Relative: 2 %
HCT: 48 % — ABNORMAL HIGH (ref 36.0–46.0)
Hemoglobin: 15.3 g/dL — ABNORMAL HIGH (ref 12.0–15.0)
Immature Granulocytes: 0 %
Lymphocytes Relative: 13 %
Lymphs Abs: 1.3 10*3/uL (ref 0.7–4.0)
MCH: 30.2 pg (ref 26.0–34.0)
MCHC: 31.9 g/dL (ref 30.0–36.0)
MCV: 94.9 fL (ref 80.0–100.0)
Monocytes Absolute: 0.6 10*3/uL (ref 0.1–1.0)
Monocytes Relative: 6 %
Neutro Abs: 7.7 10*3/uL (ref 1.7–7.7)
Neutrophils Relative %: 79 %
Platelets: 279 10*3/uL (ref 150–400)
RBC: 5.06 MIL/uL (ref 3.87–5.11)
RDW: 13.3 % (ref 11.5–15.5)
WBC: 9.9 10*3/uL (ref 4.0–10.5)
nRBC: 0 % (ref 0.0–0.2)

## 2021-02-26 LAB — COMPREHENSIVE METABOLIC PANEL
ALT: 17 U/L (ref 0–44)
AST: 14 U/L — ABNORMAL LOW (ref 15–41)
Albumin: 3.8 g/dL (ref 3.5–5.0)
Alkaline Phosphatase: 54 U/L (ref 38–126)
Anion gap: 6 (ref 5–15)
BUN: 13 mg/dL (ref 6–20)
CO2: 23 mmol/L (ref 22–32)
Calcium: 8.5 mg/dL — ABNORMAL LOW (ref 8.9–10.3)
Chloride: 108 mmol/L (ref 98–111)
Creatinine, Ser: 0.92 mg/dL (ref 0.44–1.00)
GFR, Estimated: >60 mL/min (ref >60)
Glucose, Bld: 128 mg/dL — ABNORMAL HIGH (ref 70–99)
Potassium: 3.8 mmol/L (ref 3.5–5.1)
Sodium: 137 mmol/L (ref 135–145)
Total Bilirubin: 0.5 mg/dL (ref 0.3–1.2)
Total Protein: 7 g/dL (ref 6.5–8.1)

## 2021-02-26 LAB — CYTOLOGY - PAP
Comment: NEGATIVE
High risk HPV: POSITIVE — AB

## 2021-02-26 LAB — LIPASE, BLOOD: Lipase: 34 U/L (ref 11–51)

## 2021-02-26 MED ORDER — SODIUM CHLORIDE 0.9 % IV BOLUS
1000.0000 mL | Freq: Once | INTRAVENOUS | Status: AC
  Administered 2021-02-26: 1000 mL via INTRAVENOUS

## 2021-02-26 MED ORDER — FENTANYL CITRATE PF 50 MCG/ML IJ SOSY
50.0000 ug | PREFILLED_SYRINGE | Freq: Once | INTRAMUSCULAR | Status: AC
  Administered 2021-02-26: 50 ug via INTRAVENOUS
  Filled 2021-02-26: qty 1

## 2021-02-26 MED ORDER — ONDANSETRON HCL 4 MG/2ML IJ SOLN
4.0000 mg | Freq: Once | INTRAMUSCULAR | Status: AC
Start: 2021-02-26 — End: 2021-02-26
  Administered 2021-02-26: 4 mg via INTRAVENOUS
  Filled 2021-02-26: qty 2

## 2021-02-26 NOTE — ED Provider Notes (Signed)
[facility] EMERGENCY DEPARTMENT Provider Note   CSN: [id number] Arrival date & time: 02/26/21  2100     History  Chief Complaint  Patient presents with   Abdominal Pain    Ruth Robertson is a 56 y.o. female who presents today with diffuse, cramping abdominal pain, N/V/D that began yesterday. States she began having episodes of watery stools yesterday morning and this has persisted through to today. Yesterday afternoon she also began experiencing N/V. Unable to tolerate anything by mouth. She reports mild URI symptoms last Thursday but these have become less bothersome. Endorses fever/chills. The patient has a hx of SBO and "stomach issues". States that she has been hospitalized multiple times for similar episodes, most recently in November at which time she was treated for viral gastroenteritis with Loperamide and Zofran. Denies Recent ABX.     Abdominal Pain     Home Medications Prior to Admission medications   Medication Sig Start Date End Date Taking? Authorizing Provider  acetaminophen (TYLENOL) 325 MG tablet Take 2 tablets (650 mg total) by mouth every 6 (six) hours as needed for mild pain (or Fever >/= 101). 06/20/17   Isaac Bliss, Rayford Halsted, MD  dicyclomine (BENTYL) 10 MG capsule Take 1 capsule (10 mg total) by mouth 3 (three) times daily as needed for spasms. 01/22/21   Kathyrn Drown, MD  loperamide (IMODIUM) 2 MG capsule Take 1 capsule (2 mg total) by mouth every 6 (six) hours as needed for diarrhea or loose stools. Patient not taking: Reported on 02/23/2021 12/30/20   Barb Merino, MD  ondansetron (ZOFRAN) 8 MG tablet Take 1 tablet (8 mg total) by mouth every 8 (eight) hours as needed for nausea. Patient not taking: Reported on 02/23/2021 01/24/21   Kathyrn Drown, MD  promethazine (PHENERGAN) 25 MG tablet Take 1 tablet (25 mg total) by mouth every 8 (eight) hours as needed for nausea. Patient not taking: Reported on 02/23/2021 01/24/21   Kathyrn Drown,  MD      Allergies    Patient has no known allergies.    Review of Systems   Review of Systems  Gastrointestinal:  Positive for abdominal pain.   Physical Exam Updated Vital Signs BP (!) 127/95 (BP Location: Right Arm)    Pulse (!) 104    Temp 97.9 F (36.6 C) (Oral)    Resp 18    Ht 5\' 5"  (1.651 m)    Wt 76.2 kg    LMP 06/12/2012    SpO2 97%    BMI 27.96 kg/m  Physical Exam Vitals and nursing note reviewed.  Constitutional:      General: She is not in acute distress.    Appearance: She is well-developed. She is ill-appearing. She is not diaphoretic.  HENT:     Head: Normocephalic and atraumatic.     Right Ear: External ear normal.     Left Ear: External ear normal.     Nose: Nose normal.     Mouth/Throat:     Mouth: Mucous membranes are moist.  Eyes:     General: No scleral icterus.    Conjunctiva/sclera: Conjunctivae normal.  Cardiovascular:     Rate and Rhythm: Normal rate and regular rhythm.     Heart sounds: Normal heart sounds. No murmur heard.   No friction rub. No gallop.  Pulmonary:     Effort: Pulmonary effort is normal. No respiratory distress.     Breath sounds: Normal breath sounds.  Abdominal:  General: Bowel sounds are increased. There is distension.     Palpations: Abdomen is soft. There is no mass.     Tenderness: There is generalized abdominal tenderness. There is no guarding.  Musculoskeletal:     Cervical back: Normal range of motion.  Skin:    General: Skin is warm and dry.  Neurological:     Mental Status: She is alert and oriented to person, place, and time.  Psychiatric:        Behavior: Behavior normal.    ED Results / Procedures / Treatments   Labs (all labs ordered are listed, but only abnormal results are displayed) Labs Reviewed - No data to display  EKG None  Radiology No results found.  Procedures Procedures    Medications Ordered in ED Medications - No data to display  ED Course/ Medical Decision Making/ A&P                            Medical Decision Making Amount and/or Complexity of Data Reviewed Labs: ordered.  Risk Prescription drug management.   56 year old female here with recurrent abdominal pain, nausea vomiting and diarrhea. The differential diagnosis for generalized abdominal pain includes, but is not limited to AAA, gastroenteritis, appendicitis, Bowel obstruction, Bowel perforation. Gastroparesis, DKA, Hernia, Inflammatory bowel disease, mesenteric ischemia, pancreatitis, peritonitis SBP, volvulus. I have ordered fluids, pain meds, antiemetics.  Labs ordered and pending.  Signout given to Dr. Roxanne Mins at shift change   Final Clinical Impression(s) / ED Diagnoses Final diagnoses:  None    Rx / DC Orders ED Discharge Orders     None         Margarita Mail, PA-C 02/26/21 2256    Fredia Sorrow, MD 03/04/21 [phone]

## 2021-02-26 NOTE — ED Triage Notes (Signed)
Pt to ED by POV from home with generalized abdominal pain, onset of yesterday. Pt endorses NVD also. Arrives A+O, VSS.

## 2021-02-26 NOTE — ED Provider Notes (Signed)
Care assumed from [facility] [place], Dr. Rogene Houston.  Patient with crampy abdominal pain, vomiting, diarrhea.  Currently pending labs and hydration, will need to reassess following hydration.  Labs are reassuring.  WBC is normal.  Urinalysis suggest possible UTI, but she has no urinary symptoms.  On reevaluation, nausea is improved but she is continuing to complain of generalized abdominal pain.  On exam, abdomen is diffusely tender.  Will send for CT of abdomen and pelvis.  CT scan shows inflammatory changes involving the terminal ileum.  Given her long history of problems, this is suggestive of Crohn's disease.  This is also associated with a partial small bowel obstruction which will require hospital admission.  I have independently reviewed the images, and agree with radiologist interpretation.  She is given initial dose of methylprednisolone to treat possible Crohn's disease.  Case is discussed with Dr. Clearence Ped of [facility], who agrees to admit the patient.  Results for orders placed or performed during the hospital encounter of 02/26/21  Resp Panel by RT-PCR (Flu A&B, Covid) Nasopharyngeal Swab   Specimen: Nasopharyngeal Swab; Nasopharyngeal(NP) swabs in vial transport medium  Result Value Ref Range   SARS Coronavirus 2 by RT PCR NEGATIVE NEGATIVE   Influenza A by PCR NEGATIVE NEGATIVE   Influenza B by PCR NEGATIVE NEGATIVE  CBC with Differential  Result Value Ref Range   WBC 9.9 4.0 - 10.5 K/uL   RBC 5.06 3.87 - 5.11 MIL/uL   Hemoglobin 15.3 (H) 12.0 - 15.0 g/dL   HCT 48.0 (H) 36.0 - 46.0 %   MCV 94.9 80.0 - 100.0 fL   MCH 30.2 26.0 - 34.0 pg   MCHC 31.9 30.0 - 36.0 g/dL   RDW 13.3 11.5 - 15.5 %   Platelets 279 150 - 400 K/uL   nRBC 0.0 0.0 - 0.2 %   Neutrophils Relative % 79 %   Neutro Abs 7.7 1.7 - 7.7 K/uL   Lymphocytes Relative 13 %   Lymphs Abs 1.3 0.7 - 4.0 K/uL   Monocytes Relative 6 %   Monocytes Absolute 0.6 0.1 - 1.0 K/uL   Eosinophils Relative 2 %    Eosinophils Absolute 0.2 0.0 - 0.5 K/uL   Basophils Relative 0 %   Basophils Absolute 0.0 0.0 - 0.1 K/uL   Immature Granulocytes 0 %   Abs Immature Granulocytes 0.04 0.00 - 0.07 K/uL  Comprehensive metabolic panel  Result Value Ref Range   Sodium 137 135 - 145 mmol/L   Potassium 3.8 3.5 - 5.1 mmol/L   Chloride 108 98 - 111 mmol/L   CO2 23 22 - 32 mmol/L   Glucose, Bld 128 (H) 70 - 99 mg/dL   BUN 13 6 - 20 mg/dL   Creatinine, Ser 0.92 0.44 - 1.00 mg/dL   Calcium 8.5 (L) 8.9 - 10.3 mg/dL   Total Protein 7.0 6.5 - 8.1 g/dL   Albumin 3.8 3.5 - 5.0 g/dL   AST 14 (L) 15 - 41 U/L   ALT 17 0 - 44 U/L   Alkaline Phosphatase 54 38 - 126 U/L   Total Bilirubin 0.5 0.3 - 1.2 mg/dL   GFR, Estimated >60 >60 mL/min   Anion gap 6 5 - 15  Lipase, blood  Result Value Ref Range   Lipase 34 11 - 51 U/L  Urinalysis, Routine w reflex microscopic Urine, Clean Catch  Result Value Ref Range   Color, Urine AMBER (A) YELLOW   APPearance HAZY (A) CLEAR   Specific Gravity, Urine  1.031 (H) 1.005 - 1.030   pH 5.0 5.0 - 8.0   Glucose, UA NEGATIVE NEGATIVE mg/dL   Hgb urine dipstick MODERATE (A) NEGATIVE   Bilirubin Urine NEGATIVE NEGATIVE   Ketones, ur 20 (A) NEGATIVE mg/dL   Protein, ur 100 (A) NEGATIVE mg/dL   Nitrite NEGATIVE NEGATIVE   Leukocytes,Ua NEGATIVE NEGATIVE   RBC / HPF 0-5 0 - 5 RBC/hpf   WBC, UA 21-50 0 - 5 WBC/hpf   Bacteria, UA MANY (A) NONE SEEN   Squamous Epithelial / LPF 0-5 0 - 5   Mucus PRESENT    Hyaline Casts, UA PRESENT    Amorphous Crystal PRESENT    CT ABDOMEN PELVIS W CONTRAST  Result Date: 02/27/2021 CLINICAL DATA:  Generalized abdominal pain, nausea, vomiting, diarrhea EXAM: CT ABDOMEN AND PELVIS WITH CONTRAST TECHNIQUE: Multidetector CT imaging of the abdomen and pelvis was performed using the standard protocol following bolus administration of intravenous contrast. RADIATION DOSE REDUCTION: This exam was performed according to the departmental dose-optimization program  which includes automated exposure control, adjustment of the mA and/or kV according to patient size and/or use of iterative reconstruction technique. CONTRAST:  172mL OMNIPAQUE IOHEXOL 300 MG/ML  SOLN COMPARISON:  12/28/2020, 06/19/2017 FINDINGS: Lower chest: Ground-glass opacity within the visualized lung bases bilaterally posteriorly may relate to multifocal infection or aspiration. No pleural effusion. Cardiac size within normal limits. No pericardial effusion. Hepatobiliary: Mild hepatic steatosis. No enhancing intrahepatic mass. No intra or extrahepatic biliary ductal dilation. Gallbladder unremarkable. Pancreas: Unremarkable Spleen: Unremarkable Adrenals/Urinary Tract: The adrenal glands are unremarkable. The kidneys are normal. The bladder is unremarkable. Stomach/Bowel: There is marked circumferential bowel wall thickening, hyperemia, and mesenteric edema involving the terminal ileum in keeping with a infectious or inflammatory enteritis. The distal jejunum upstream from this long segment of inflamed ileum is dilated and fluid-filled in keeping with a secondary partial small bowel obstruction. Mild free fluid within the pelvis. No free intraperitoneal gas. No loculated intra-abdominal fluid collections. The findings are similar to those noted on 12/28/2020 and inflammatory changes within the ileum appear progressive when compared to remote prior examination of 06/19/2017 and 06/28/2016. The stomach, small bowel, and large bowel are otherwise unremarkable. Appendix normal. Vascular/Lymphatic: No significant vascular findings are present. No enlarged abdominal or pelvic lymph nodes. Reproductive: Status post hysterectomy. No adnexal masses. Other: No abdominal wall hernia.  Rectum unremarkable. Musculoskeletal: No acute bone abnormality. No lytic or blastic bone lesion. IMPRESSION: Severe long segment inflammatory changes involving the terminal ileum in keeping with an infectious or inflammatory enteritis.  Given the recurrent and progressive appearance when compared to multiple prior examinations, inflammatory conditions such as Crohn's enterocolitis should be strongly considered. No evidence of perforation. No intra-abdominal abscess formation. Secondary partial small bowel obstruction.  Mild ascites. Bibasilar ground-glass opacity, likely infectious or inflammatory in nature. Mild hepatic steatosis. Electronically Signed   By: Fidela Salisbury M.D.   On: 04/25/2246 25:00      Delora Fuel, MD 37/04/88 [phone]

## 2021-02-27 ENCOUNTER — Emergency Department (HOSPITAL_COMMUNITY): Payer: 59

## 2021-02-27 ENCOUNTER — Encounter (HOSPITAL_COMMUNITY): Payer: Self-pay | Admitting: Family Medicine

## 2021-02-27 DIAGNOSIS — D124 Benign neoplasm of descending colon: Secondary | ICD-10-CM | POA: Diagnosis not present

## 2021-02-27 DIAGNOSIS — K6389 Other specified diseases of intestine: Secondary | ICD-10-CM | POA: Diagnosis not present

## 2021-02-27 DIAGNOSIS — K769 Liver disease, unspecified: Secondary | ICD-10-CM | POA: Diagnosis present

## 2021-02-27 DIAGNOSIS — M359 Systemic involvement of connective tissue, unspecified: Secondary | ICD-10-CM | POA: Diagnosis present

## 2021-02-27 DIAGNOSIS — K760 Fatty (change of) liver, not elsewhere classified: Secondary | ICD-10-CM | POA: Diagnosis not present

## 2021-02-27 DIAGNOSIS — K529 Noninfective gastroenteritis and colitis, unspecified: Secondary | ICD-10-CM | POA: Diagnosis not present

## 2021-02-27 DIAGNOSIS — F419 Anxiety disorder, unspecified: Secondary | ICD-10-CM | POA: Diagnosis not present

## 2021-02-27 DIAGNOSIS — J181 Lobar pneumonia, unspecified organism: Secondary | ICD-10-CM | POA: Diagnosis present

## 2021-02-27 DIAGNOSIS — R7989 Other specified abnormal findings of blood chemistry: Secondary | ICD-10-CM | POA: Diagnosis not present

## 2021-02-27 DIAGNOSIS — Z86010 Personal history of colonic polyps: Secondary | ICD-10-CM | POA: Diagnosis not present

## 2021-02-27 DIAGNOSIS — R109 Unspecified abdominal pain: Secondary | ICD-10-CM | POA: Diagnosis not present

## 2021-02-27 DIAGNOSIS — R188 Other ascites: Secondary | ICD-10-CM | POA: Diagnosis not present

## 2021-02-27 DIAGNOSIS — J9811 Atelectasis: Secondary | ICD-10-CM | POA: Diagnosis not present

## 2021-02-27 DIAGNOSIS — R042 Hemoptysis: Secondary | ICD-10-CM | POA: Diagnosis not present

## 2021-02-27 DIAGNOSIS — R933 Abnormal findings on diagnostic imaging of other parts of digestive tract: Secondary | ICD-10-CM | POA: Diagnosis not present

## 2021-02-27 DIAGNOSIS — K56600 Partial intestinal obstruction, unspecified as to cause: Principal | ICD-10-CM

## 2021-02-27 DIAGNOSIS — R0602 Shortness of breath: Secondary | ICD-10-CM | POA: Diagnosis not present

## 2021-02-27 DIAGNOSIS — Z90710 Acquired absence of both cervix and uterus: Secondary | ICD-10-CM | POA: Diagnosis not present

## 2021-02-27 DIAGNOSIS — J189 Pneumonia, unspecified organism: Secondary | ICD-10-CM | POA: Diagnosis not present

## 2021-02-27 DIAGNOSIS — R001 Bradycardia, unspecified: Secondary | ICD-10-CM | POA: Diagnosis not present

## 2021-02-27 DIAGNOSIS — Z7952 Long term (current) use of systemic steroids: Secondary | ICD-10-CM | POA: Diagnosis not present

## 2021-02-27 DIAGNOSIS — N390 Urinary tract infection, site not specified: Secondary | ICD-10-CM | POA: Diagnosis present

## 2021-02-27 DIAGNOSIS — K2950 Unspecified chronic gastritis without bleeding: Secondary | ICD-10-CM | POA: Diagnosis not present

## 2021-02-27 DIAGNOSIS — R1110 Vomiting, unspecified: Secondary | ICD-10-CM | POA: Diagnosis not present

## 2021-02-27 DIAGNOSIS — K449 Diaphragmatic hernia without obstruction or gangrene: Secondary | ICD-10-CM | POA: Diagnosis not present

## 2021-02-27 DIAGNOSIS — R197 Diarrhea, unspecified: Secondary | ICD-10-CM | POA: Diagnosis not present

## 2021-02-27 DIAGNOSIS — K2960 Other gastritis without bleeding: Secondary | ICD-10-CM | POA: Diagnosis not present

## 2021-02-27 DIAGNOSIS — K209 Esophagitis, unspecified without bleeding: Secondary | ICD-10-CM | POA: Diagnosis not present

## 2021-02-27 DIAGNOSIS — R059 Cough, unspecified: Secondary | ICD-10-CM | POA: Diagnosis not present

## 2021-02-27 DIAGNOSIS — K219 Gastro-esophageal reflux disease without esophagitis: Secondary | ICD-10-CM | POA: Diagnosis not present

## 2021-02-27 DIAGNOSIS — Z20822 Contact with and (suspected) exposure to covid-19: Secondary | ICD-10-CM | POA: Diagnosis not present

## 2021-02-27 DIAGNOSIS — K259 Gastric ulcer, unspecified as acute or chronic, without hemorrhage or perforation: Secondary | ICD-10-CM | POA: Diagnosis not present

## 2021-02-27 DIAGNOSIS — F411 Generalized anxiety disorder: Secondary | ICD-10-CM | POA: Diagnosis present

## 2021-02-27 DIAGNOSIS — K635 Polyp of colon: Secondary | ICD-10-CM | POA: Diagnosis not present

## 2021-02-27 DIAGNOSIS — Z720 Tobacco use: Secondary | ICD-10-CM | POA: Diagnosis not present

## 2021-02-27 DIAGNOSIS — J45909 Unspecified asthma, uncomplicated: Secondary | ICD-10-CM | POA: Diagnosis not present

## 2021-02-27 DIAGNOSIS — Z79899 Other long term (current) drug therapy: Secondary | ICD-10-CM | POA: Diagnosis not present

## 2021-02-27 DIAGNOSIS — R1084 Generalized abdominal pain: Secondary | ICD-10-CM | POA: Diagnosis not present

## 2021-02-27 DIAGNOSIS — F17210 Nicotine dependence, cigarettes, uncomplicated: Secondary | ICD-10-CM | POA: Diagnosis not present

## 2021-02-27 DIAGNOSIS — R112 Nausea with vomiting, unspecified: Secondary | ICD-10-CM | POA: Diagnosis not present

## 2021-02-27 DIAGNOSIS — R0789 Other chest pain: Secondary | ICD-10-CM | POA: Diagnosis not present

## 2021-02-27 DIAGNOSIS — K7689 Other specified diseases of liver: Secondary | ICD-10-CM | POA: Diagnosis not present

## 2021-02-27 LAB — URINALYSIS, ROUTINE W REFLEX MICROSCOPIC
Bilirubin Urine: NEGATIVE
Glucose, UA: NEGATIVE mg/dL
Ketones, ur: 20 mg/dL — AB
Leukocytes,Ua: NEGATIVE
Nitrite: NEGATIVE
Protein, ur: 100 mg/dL — AB
Specific Gravity, Urine: 1.031 — ABNORMAL HIGH (ref 1.005–1.030)
pH: 5 (ref 5.0–8.0)

## 2021-02-27 LAB — C DIFFICILE QUICK SCREEN W PCR REFLEX
C Diff antigen: NEGATIVE
C Diff interpretation: NOT DETECTED
C Diff toxin: NEGATIVE

## 2021-02-27 LAB — C-REACTIVE PROTEIN: CRP: 1.3 mg/dL — ABNORMAL HIGH (ref <1.0)

## 2021-02-27 MED ORDER — METRONIDAZOLE 500 MG/100ML IV SOLN
500.0000 mg | Freq: Two times a day (BID) | INTRAVENOUS | Status: DC
  Administered 2021-02-27 – 2021-03-01 (×6): 500 mg via INTRAVENOUS
  Filled 2021-02-27 (×6): qty 100

## 2021-02-27 MED ORDER — SODIUM CHLORIDE 0.9 % IV SOLN: INTRAVENOUS | Status: DC

## 2021-02-27 MED ORDER — METHYLPREDNISOLONE SODIUM SUCC 40 MG IJ SOLR
40.0000 mg | Freq: Two times a day (BID) | INTRAMUSCULAR | Status: DC
  Administered 2021-02-27: 10:00:00 40 mg via INTRAVENOUS
  Filled 2021-02-27: qty 1

## 2021-02-27 MED ORDER — ACETAMINOPHEN 325 MG PO TABS: 650.0000 mg | ORAL_TABLET | Freq: Four times a day (QID) | ORAL | Status: DC | PRN

## 2021-02-27 MED ORDER — MORPHINE SULFATE (PF) 4 MG/ML IV SOLN
4.0000 mg | INTRAVENOUS | Status: DC | PRN
  Administered 2021-02-27 – 2021-03-01 (×3): 4 mg via INTRAVENOUS
  Filled 2021-02-27 (×4): qty 1

## 2021-02-27 MED ORDER — ONDANSETRON HCL 4 MG PO TABS: 4.0000 mg | ORAL_TABLET | Freq: Four times a day (QID) | ORAL | Status: DC | PRN

## 2021-02-27 MED ORDER — SODIUM CHLORIDE 0.9 % IV SOLN
1.0000 g | INTRAVENOUS | Status: DC
  Administered 2021-02-27 – 2021-03-01 (×3): 1 g via INTRAVENOUS
  Filled 2021-02-27 (×3): qty 10

## 2021-02-27 MED ORDER — HEPARIN SODIUM (PORCINE) 5000 UNIT/ML IJ SOLN
5000.0000 [IU] | Freq: Three times a day (TID) | INTRAMUSCULAR | Status: DC
  Administered 2021-02-27 – 2021-03-01 (×9): 5000 [IU] via SUBCUTANEOUS
  Filled 2021-02-27 (×10): qty 1

## 2021-02-27 MED ORDER — METHYLPREDNISOLONE SODIUM SUCC 125 MG IJ SOLR
125.0000 mg | Freq: Once | INTRAMUSCULAR | Status: AC
Start: 2021-02-27 — End: 2021-02-27
  Administered 2021-02-27: 03:00:00 125 mg via INTRAVENOUS
  Filled 2021-02-27: qty 2

## 2021-02-27 MED ORDER — OXYCODONE HCL 5 MG PO TABS: 5.0000 mg | ORAL_TABLET | ORAL | Status: DC | PRN

## 2021-02-27 MED ORDER — HYDROCODONE-ACETAMINOPHEN 5-325 MG PO TABS: 1.0000 | ORAL_TABLET | ORAL | 0 refills | Status: DC | PRN

## 2021-02-27 MED ORDER — METHYLPREDNISOLONE SODIUM SUCC 125 MG IJ SOLR
60.0000 mg | Freq: Every day | INTRAMUSCULAR | Status: DC
  Administered 2021-02-28 – 2021-03-01 (×2): 60 mg via INTRAVENOUS
  Filled 2021-02-27 (×2): qty 2

## 2021-02-27 MED ORDER — ACETAMINOPHEN 650 MG RE SUPP: 650.0000 mg | Freq: Four times a day (QID) | RECTAL | Status: DC | PRN

## 2021-02-27 MED ORDER — IOHEXOL 300 MG/ML  SOLN
100.0000 mL | Freq: Once | INTRAMUSCULAR | Status: AC | PRN
  Administered 2021-02-27: 02:00:00 100 mL via INTRAVENOUS

## 2021-02-27 MED ORDER — PREDNISONE 50 MG PO TABS: 50.0000 mg | ORAL_TABLET | Freq: Every day | ORAL | 0 refills | Status: DC

## 2021-02-27 MED ORDER — ONDANSETRON HCL 4 MG/2ML IJ SOLN
4.0000 mg | Freq: Four times a day (QID) | INTRAMUSCULAR | Status: DC | PRN
  Administered 2021-02-27 – 2021-03-01 (×3): 4 mg via INTRAVENOUS
  Filled 2021-02-27 (×4): qty 2

## 2021-02-27 MED ORDER — MORPHINE SULFATE (PF) 4 MG/ML IV SOLN
4.0000 mg | Freq: Once | INTRAVENOUS | Status: AC
  Administered 2021-02-27: 02:00:00 4 mg via INTRAVENOUS
  Filled 2021-02-27: qty 1

## 2021-02-27 MED ORDER — PANTOPRAZOLE SODIUM 40 MG IV SOLR
40.0000 mg | Freq: Every day | INTRAVENOUS | Status: DC
  Administered 2021-02-27 – 2021-03-01 (×3): 40 mg via INTRAVENOUS
  Filled 2021-02-27 (×3): qty 40

## 2021-02-27 NOTE — Progress Notes (Signed)
Patient seen and examined.  Admitted after midnight secondary to abdominal pain, nausea and vomiting.  Patient has history of Crohn's based on work-up concerns or for partial SBO in the setting of enteritis infectious versus inflammatory.  There is also abnormal urine with concerns for UTI.  Patient is hemodynamically stable currently.  Please refer to H&P written by Dr. Clearence Ped on 02/27/21 for further info/details on admission.  Plan: -Continue current antibiotics and follow culture results. -Continue as needed analgesics, antiemetics, IV fluids and supportive care. -Follow GI service recommendations. -Currently no requiring the use of NG tube, but we will closely follow patient's symptoms. -Follow electrolytes closely and replete and as required. -Continue current dose of the steroids -Continue PPI for GI prophylaxis.   Barton Dubois MD [phone]

## 2021-02-27 NOTE — Plan of Care (Signed)
Patient arrived on unit from ED alert and oriented x 4. Patient states that she feels better since receiving medication in the ED. Patient settled into room, admission assessment initiated.

## 2021-02-27 NOTE — Discharge Instructions (Addendum)
Continue using your ondansetron (Zofran) as needed for nausea.  Please make sure to keep your appointment with [facility].  Return to the emergency department if symptoms or not being adequately controlled at home.

## 2021-02-27 NOTE — Consult Note (Signed)
@LOGO @   Referring Provider: [facility]  Primary Care Physician:  Kathyrn Drown, MD Primary Gastroenterologist:  Dr. Hipolito Bayley ([facility]) previously, scheduled to see Thornton Park, MD with [facility] on 03/08/21.  Date of Admission: 02/26/21 Date of Consultation: 02/27/21  Reason for Consultation:  partial small bowel obstruction, possible Crohn's   HPI:  Ruth Robertson is a 56 y.o. year old female with with history of asthma, anxiety, GERD, liver lesion consistent with [place] on MRI in 2019, who presented to the emergency room yesterday due to diffuse, cramping abdominal pain, nausea/vomiting/diarrhea that began 1 day prior.  ED Course:  Temp 97.9, heart rate 53-104, respiratory rate 18, blood pressure ranging from 112/76-127/95, maintaining oxygen saturations on room air No leukocytosis, hemoglobin 15.3, platelets 279 Chemistry panel without significant abnormality Patient had a negative COVID and flu UA is suspicious for UTI, urine culture pending CT abdomen shows severe long segment inflammatory changes involving the terminal ileum that could be infectious enteritis but Crohn's should be considered.  No perforation or abscess. Also with secondary partial small bowel obstruction.  Patient was given fentanyl, Solu-Medrol, morphine, Zofran, 1 L normal saline in the ED  Today:  Since 2017, she has had intermittent episodes of watery diarrhea, nausea, vomiting, and diffuse, sharp, crampy abdominal pain that will circle around her entire abdomen.  She has been having flares about every 6 months, but in 2021 and up until Thanksgiving of 2022, she had not been having any symptoms.  Since Thanksgiving, she had 2 additional flares in December, and now again with recurrent symptoms.  When symptoms started, she was having bowel movements every 1-2 hours.  Since has been at the hospital, she is only had 1 bowel movement which was around 6 AM this morning.  Overall, her abdominal pain is  improved.  Still with some soreness to the touch.  Nausea and vomiting have also improved/resolved.  Reports she had actually been in her usual state of health most of January until now.  When she feels well, she has normal bowel movements daily with 1-2 soft, formed stools without abdominal pain or nausea/vomiting.  Denies any chronic weight loss.  States she had previously been gaining weight.  She has lost maybe 5 pounds since Thanksgiving.  She reports having black stools previously with her flares, but not at this time.  Her stools have been green and liquidy.  No brbpr currently. Reports she has had some rectal bleeding in 2010.   No recent abx, travel, ill contacts, or well water.   No NSAIDs.    Prior imaging/GI evaluation:  CT November 2022 with diffuse thickened and inflamed appearance of the small bowel consistent with enteritis. CT in May 2019 with partial or early small bowel obstruction, transition point at TI which was markedly narrowed, findings concerning for infectious versus inflammatory bowel disease. CT in May 2018 with mild circumferential wall thickening of the distal and terminal ileum without obstruction. CT in August 2017 with thick-walled and inflamed small bowel loops throughout the abdomen with dilated mid jejunum, no transition point.  Bowel wall inflammation progresses distally and continues to TI where the cecum and appendix also appeared mildly involved.  Colonoscopy August 2017 with normal-appearing TI, five 2 to 3 mm hyperplastic polyps removed, nonbleeding internal hemorrhoids, diverticulosis in sigmoid and transverse colon. Colonoscopy June 2019 with normal-appearing colon and terminal ileum s/p biopsied, nonbleeding internal hemorrhoids.  Pathology was completely benign.  Capsule endoscopy June 2018: Mild duodenitis.  Meckel's scan August  2019: Negative.  Past Medical History:  Diagnosis Date   Anxiety    Asthma    ONLY WITH BRONCHITIS   GAD  (generalized anxiety disorder)    GERD (gastroesophageal reflux disease)    Microscopic hematuria 12/03/2017   Negative cystoscopy negative CAT scan   Pneumonia    PONV (postoperative nausea and vomiting)     Past Surgical History:  Procedure Laterality Date   AGILE CAPSULE N/A 07/09/2016   Procedure: AGILE CAPSULE;  Surgeon: Danie Binder, MD;  [facility];  Service: Endoscopy;  Laterality: N/A;  7:30am, pt to arrive at 7:00am   BIOPSY  09/19/2015   Procedure: BIOPSY;  Surgeon: Danie Binder, MD;  Location: [facility];  Service: Endoscopy;;  random colon bx's   COLONOSCOPY WITH PROPOFOL N/A 09/19/2015   Dr. Oneida Alar: five 2-3 mm polyps in rectum (hyperplastic) and descending colon, diverticulosis in sigmoid colon and transverse colon. Query post-infectious IBS. Next colonoscopy in 10 years    CYST EXCISION N/A 01/20/2019   Procedure: EXCISION OF RIGHT GROIN SEBACEOUS CYST;  Surgeon: Erroll Luna, MD;  Location: [facility];  Service: General;  Laterality: N/A;   GIVENS CAPSULE STUDY N/A 07/30/2016   Procedure: GIVENS CAPSULE STUDY;  Surgeon: Danie Binder, MD;  [facility];  Service: Endoscopy;  Laterality: N/A;   HYSTEROSCOPY  2006   POLYPECTOMY  09/19/2015   Procedure: POLYPECTOMY;  Surgeon: Danie Binder, MD;  [facility];  Service: Endoscopy;;  descending colon polyps x3 cold bx, rectal polyp x2   SALPINGOOPHORECTOMY Bilateral 08/18/2012   Procedure: SALPINGO OOPHORECTOMY;  Surgeon: Anastasio Auerbach, MD;  Location: [facility];  Service: Gynecology;  Laterality: Bilateral;   VAGINAL HYSTERECTOMY N/A 08/18/2012   Procedure: HYSTERECTOMY VAGINAL;  Surgeon: Anastasio Auerbach, MD;  Location: [facility];  Service: Gynecology;  Laterality: N/A;    Prior to Admission medications   Medication Sig Start Date End Date Taking? Authorizing Provider  HYDROcodone-acetaminophen (NORCO) 5-325 MG tablet Take 1 tablet by mouth every 4 (four) hours as  needed for moderate pain. 1/60/73  Yes Delora Fuel, MD  predniSONE (DELTASONE) 50 MG tablet Take 1 tablet (50 mg total) by mouth daily. 08/13/60  Yes Delora Fuel, MD  acetaminophen (TYLENOL) 325 MG tablet Take 2 tablets (650 mg total) by mouth every 6 (six) hours as needed for mild pain (or Fever >/= 101). 06/20/17   Isaac Bliss, Rayford Halsted, MD  ondansetron (ZOFRAN) 8 MG tablet Take 1 tablet (8 mg total) by mouth every 8 (eight) hours as needed for nausea. Patient not taking: Reported on 02/23/2021 01/24/21   Kathyrn Drown, MD    Current Facility-Administered Medications  Medication Dose Route Frequency Provider Last Rate Last Admin   0.9 %  sodium chloride infusion   Intravenous Continuous Zierle-Ghosh, Asia B, DO       acetaminophen (TYLENOL) tablet 650 mg  650 mg Oral Q6H PRN Zierle-Ghosh, Asia B, DO       Or   acetaminophen (TYLENOL) suppository 650 mg  650 mg Rectal Q6H PRN Zierle-Ghosh, Asia B, DO       cefTRIAXone (ROCEPHIN) 1 g in sodium chloride 0.9 % 100 mL IVPB  1 g Intravenous Q24H Zierle-Ghosh, Asia B, DO   Stopped at 02/27/21 0531   heparin injection 5,000 Units  5,000 Units Subcutaneous Q8H Zierle-Ghosh, Asia B, DO       methylPREDNISolone sodium succinate (SOLU-MEDROL) 40 mg/mL injection 40 mg  40 mg Intravenous Q12H Zierle-Ghosh, Asia B, DO       metroNIDAZOLE (FLAGYL) IVPB 500 mg  500 mg Intravenous Q12H Zierle-Ghosh, Asia B, DO   Stopped at 02/27/21 0532   morphine 4 MG/ML injection 4 mg  4 mg Intravenous Q2H PRN Zierle-Ghosh, Asia B, DO       ondansetron (ZOFRAN) tablet 4 mg  4 mg Oral Q6H PRN Zierle-Ghosh, Asia B, DO       Or   ondansetron (ZOFRAN) injection 4 mg  4 mg Intravenous Q6H PRN Zierle-Ghosh, Asia B, DO       oxyCODONE (Oxy IR/ROXICODONE) immediate release tablet 5 mg  5 mg Oral Q4H PRN Zierle-Ghosh, Asia B, DO        Allergies as of 02/26/2021   (No Known Allergies)    Family History  Problem Relation Age of Onset   Heart failure Father    Stroke  Father    Breast cancer Maternal Aunt        50's   Diabetes Paternal Grandfather    Hypertension Maternal Grandmother    Colon cancer Maternal Aunt    Heart attack Mother     Social History   Socioeconomic History   Marital status: Divorced    Spouse name: Not on file   Number of children: Not on file   Years of education: Not on file   Highest education level: Not on file  Occupational History   Not on file  Tobacco Use   Smoking status: Every Day    Packs/day: 1.00    Years: 25.00    Pack years: 25.00    Types: Cigarettes   Smokeless tobacco: Never  Vaping Use   Vaping Use: Never used  Substance and Sexual Activity   Alcohol use: Not Currently    Alcohol/week: 0.0 standard drinks    Comment: rare   Drug use: No   Sexual activity: Yes    Partners: Male    Comment: HYST-1st intercourse 58 yo-5 partners  Other Topics Concern   Not on file  Social History Narrative   Not on file   Social Determinants of Health   Financial Resource Strain: Not on file  Food Insecurity: Not on file  Transportation Needs: Not on file  Physical Activity: Not on file  Stress: Not on file  Social Connections: Not on file  Intimate Partner Violence: Not on file    Review of Systems: Gen: Denies fever, chills, cold or flulike symptoms, presyncope, syncope. CV: Denies chest pain, heart palpitations. Resp: Denies shortness of breath or cough.  GI: See HPI GU : Denies urinary burning, urinary frequency, urinary incontinence.  MS: Denies joint pain Derm: Denies rash Psych: Denies depression, anxiety. Heme: See HPI  Physical Exam: Vital signs in last 24 hours: Temp:  [97.9 F (36.6 C)] 97.9 F (36.6 C) (01/24 0624) Pulse Rate:  [53-104] 56 (01/24 0624) Resp:  [16-18] 16 (01/24 0624) BP: (109-127)/(76-95) 118/78 (01/24 0624) SpO2:  [93 %-97 %] 95 % (01/24 0624) Weight:  [75.5 kg-76.2 kg] 75.5 kg (01/23 2107) Last BM Date: 02/27/21 General:   Alert,  Well-developed,  well-nourished, pleasant and cooperative in NAD Head:  Normocephalic and atraumatic. Eyes:  Sclera clear, no icterus.   Conjunctiva pink. Ears:  Normal auditory acuity. Lungs:  Clear throughout to auscultation.   No wheezes, crackles, or rhonchi. No acute distress. Heart:  Regular rate and rhythm; no murmurs, clicks, rubs,  or gallops. Abdomen:  Full, soft, mild generalized TTP, mostly  across the lower abdomen and in LUQ. No masses, hepatosplenomegaly or hernias noted. Normal bowel sounds, without guarding, and without rebound.   Rectal:  Deferred Msk:  Symmetrical without gross deformities. Normal posture. Extremities:  Without edema. Neurologic:  Alert and  oriented x4;  grossly normal neurologically. Skin:  Intact without significant lesions or rashes. Psych: Normal mood and affect.  Intake/Output from previous day: 01/23 0701 - 01/24 0700 In: 1200 [IV Piggyback:1200] Out: -  Intake/Output this shift: No intake/output data recorded.  Lab Results: Recent Labs    02/26/21 2249  WBC 9.9  HGB 15.3*  HCT 48.0*  PLT 279   BMET Recent Labs    02/26/21 2249  NA 137  K 3.8  CL 108  CO2 23  GLUCOSE 128*  BUN 13  CREATININE 0.92  CALCIUM 8.5*   LFT Recent Labs    02/26/21 2249  PROT 7.0  ALBUMIN 3.8  AST 14*  ALT 17  ALKPHOS 54  BILITOT 0.5     Studies/Results: CT ABDOMEN PELVIS W CONTRAST  Result Date: 02/27/2021 CLINICAL DATA:  Generalized abdominal pain, nausea, vomiting, diarrhea EXAM: CT ABDOMEN AND PELVIS WITH CONTRAST TECHNIQUE: Multidetector CT imaging of the abdomen and pelvis was performed using the standard protocol following bolus administration of intravenous contrast. RADIATION DOSE REDUCTION: This exam was performed according to the departmental dose-optimization program which includes automated exposure control, adjustment of the mA and/or kV according to patient size and/or use of iterative reconstruction technique. CONTRAST:  154mL OMNIPAQUE  IOHEXOL 300 MG/ML  SOLN COMPARISON:  12/28/2020, 06/19/2017 FINDINGS: Lower chest: Ground-glass opacity within the visualized lung bases bilaterally posteriorly may relate to multifocal infection or aspiration. No pleural effusion. Cardiac size within normal limits. No pericardial effusion. Hepatobiliary: Mild hepatic steatosis. No enhancing intrahepatic mass. No intra or extrahepatic biliary ductal dilation. Gallbladder unremarkable. Pancreas: Unremarkable Spleen: Unremarkable Adrenals/Urinary Tract: The adrenal glands are unremarkable. The kidneys are normal. The bladder is unremarkable. Stomach/Bowel: There is marked circumferential bowel wall thickening, hyperemia, and mesenteric edema involving the terminal ileum in keeping with a infectious or inflammatory enteritis. The distal jejunum upstream from this long segment of inflamed ileum is dilated and fluid-filled in keeping with a secondary partial small bowel obstruction. Mild free fluid within the pelvis. No free intraperitoneal gas. No loculated intra-abdominal fluid collections. The findings are similar to those noted on 12/28/2020 and inflammatory changes within the ileum appear progressive when compared to remote prior examination of 06/19/2017 and 06/28/2016. The stomach, small bowel, and large bowel are otherwise unremarkable. Appendix normal. Vascular/Lymphatic: No significant vascular findings are present. No enlarged abdominal or pelvic lymph nodes. Reproductive: Status post hysterectomy. No adnexal masses. Other: No abdominal wall hernia.  Rectum unremarkable. Musculoskeletal: No acute bone abnormality. No lytic or blastic bone lesion. IMPRESSION: Severe long segment inflammatory changes involving the terminal ileum in keeping with an infectious or inflammatory enteritis. Given the recurrent and progressive appearance when compared to multiple prior examinations, inflammatory conditions such as Crohn's enterocolitis should be strongly considered. No  evidence of perforation. No intra-abdominal abscess formation. Secondary partial small bowel obstruction.  Mild ascites. Bibasilar ground-glass opacity, likely infectious or inflammatory in nature. Mild hepatic steatosis. Electronically Signed   By: Fidela Salisbury M.D.   On: 02/27/2021 02:32    Impression: 56 year old female with history of asthma, anxiety, GERD, liver lesion consistent with [place] on MRI in 2019, chronic history of recurrent abdominal pain, nausea, vomiting, diarrhea with extensive evaluation previously that has been unrevealing, who presented  to the emergency room yesterday due to diffuse, cramping abdominal pain, nausea/vomiting/diarrhea x1 day, now admitted with enteritis involving the terminal ileum with associated partial small bowel obstruction.   Enteritis with partial small bowel obstruction: Patient has had multiple recurrent bouts of enteritis with inflammation of the terminal ileum dating back to 2017.  She had a colonoscopy in 2017 with normal-appearing TI, 5 small hyperplastic polyps removed.  Capsule endoscopy in June 2018 with mild duodenitis.  Repeat colonoscopy in June 2019 and again with normal-appearing colon and terminal ileum biopsied with completely benign pathology.  Patient reports she clinically did well through 2021 up until Thanksgiving of 2022 when she began to have recurrent symptoms of abdominal pain, nausea/vomiting, and diarrhea.  This is her third episode since Thanksgiving.  Etiology is unclear, but considering recurrent TI abnormalities with some progressive appearance on the CT, there is concern for underlying Crohn's disease.  Notably, sed rate and CRP were within normal limits in December when she was seen by PCP for similar symptoms.  No leukocytosis or anemia on admission.  No No Fhx of IBD.   At this time, we will check stool studies to rule out infectious enteritis. Spoke with Dr. Jenetta Downer.  We will hold off on antibiotics for now continue her on  steroids.  She will benefit from a colonoscopy with TI biopsies this admission once we know she is tolerating p.o. well.  We will start her on clear liquids at this time.  Regarding her partial bowel obstruction, clinically, she is not obstructed. BS are positive, last BM was this morning at 6 AM, abdominal pain improving, and nausea/vomiting resolved.   Plan: C diff and GI pathogen panel. CRP Hold off on empiric antibiotics for now. Continue Solu-Medrol at 60 mg daily. Clear liquid diet as tolerated. No need for NG tube at this time. Needs colonoscopy with TI biopsies once tolerating p.o. well.  Continue supportive measures.    LOS: 0 days    02/27/2021, 7:40 AM   Aliene Altes, [facility]

## 2021-02-27 NOTE — H&P (Signed)
TRH H&P    Patient Demographics:    Ruth Robertson, is a 56 y.o. female  MRN: [id number]  DOB - 1965/09/06  Admit Date - 02/26/2021  Referring MD/NP/PA: Roxanne Mins  Outpatient Primary MD for the patient is Kathyrn Drown, MD  Patient coming from: Home  Chief complaint- Diarrhea   HPI:    Ruth Robertson  is a 56 y.o. female, history of anxiety, asthma, GERD, and more presents to ED with a chief complaint of diarrhea and vomiting.  Patient reports that she has had 2 episodes of emesis, 1/day, but that she has been having diarrhea every hour for 2 days.  She reports that it appears as green liquid.  She has had symptoms like this before since 2017.  She has been worked up at [facility] and told that she has autoimmune disease but she does not know which one.  She has not been started on any steroid medication.  Patient reports that her abdominal pain follows her colon.  It goes up the right side of her abdomen across her epigastric region and then down to her lower abdomen on the left.  It is sharp and crampy.  It comes in waves.  Having a bowel movement does not affect the pain.  Her last normal meal was 2 days ago and was a ham biscuit.  She tried a couple bites of mashed potatoes yesterday, and that did not affect the pain.  Patient reports she has Bentyl, Zofran, Phenergan at home.  She has been using them all.  They offer minimal improvement but not enough relief to keep her out of the hospital.  Patient reports she cannot tell if she is bloated but says her abdomen does get tight when she has the flares of pain.  In the days leading up to this she was in her normal state of health.  She had not been constipated, exposed to anybody ill, had any fevers.  She reports these flares are sudden in onset and do not seem to have any provocative factors.  Patient has no other complaints at this time.  Patient is a current  smoker half a pack per day.  She does not drink alcohol, she does not use illicit drugs.  She does not request a nicotine patch at this time.  She is vaccinated for COVID.  Patient is full code.  In the ED Temp 97.9, heart rate 53-104, respiratory rate 18, blood pressure ranging from 112/76-127/95, maintaining oxygen saturations on room air No leukocytosis, hemoglobin 15.3 likely hemoconcentrated, platelets 279 Chemistry panel is unremarkable Patient had a negative COVID and flu UA is suspicious for UTI, urine culture pending CT abdomen shows severe long segment inflammatory changes involving the terminal ileum that could be infectious enteritis but Crohn's should be considered.  No perforation or abscess.  He is also secondary to partial small bowel obstruction. Patient was given fentanyl, Solu-Medrol, morphine, Zofran, 1 L normal saline in the ED Admission requested for further management of possible Crohn's and resulting partial small bowel obstruction  Review of systems:    In addition to the HPI above,  No Fever-chills, No Headache, No changes with Vision or hearing, No problems swallowing food or Liquids, No Chest pain, Cough or Shortness of Breath, No Blood in stool or Urine, No dysuria, No new skin rashes or bruises, No new joints pains-aches,  No new weakness, tingling, numbness in any extremity, No recent weight gain or loss, No polydypsia or polyphagia, No significant Mental Stressors.  All other systems reviewed and are negative.    Past History of the following :    Past Medical History:  Diagnosis Date   Anxiety    Asthma    ONLY WITH BRONCHITIS   GAD (generalized anxiety disorder)    GERD (gastroesophageal reflux disease)    Microscopic hematuria 12/03/2017   Negative cystoscopy negative CAT scan   Pneumonia    PONV (postoperative nausea and vomiting)       Past Surgical History:  Procedure Laterality Date   AGILE CAPSULE N/A 07/09/2016   Procedure:  AGILE CAPSULE;  Surgeon: Danie Binder, MD;  [facility];  Service: Endoscopy;  Laterality: N/A;  7:30am, pt to arrive at 7:00am   BIOPSY  09/19/2015   Procedure: BIOPSY;  Surgeon: Danie Binder, MD;  [facility];  Service: Endoscopy;;  random colon bx's   COLONOSCOPY WITH PROPOFOL N/A 09/19/2015   Dr. Oneida Alar: five 2-3 mm polyps in rectum (hyperplastic) and descending colon, diverticulosis in sigmoid colon and transverse colon. Query post-infectious IBS. Next colonoscopy in 10 years    CYST EXCISION N/A 01/20/2019   Procedure: EXCISION OF RIGHT GROIN SEBACEOUS CYST;  Surgeon: Erroll Luna, MD;  Location: [facility];  Service: General;  Laterality: N/A;   GIVENS CAPSULE STUDY N/A 07/30/2016   Procedure: GIVENS CAPSULE STUDY;  Surgeon: Danie Binder, MD;  [facility];  Service: Endoscopy;  Laterality: N/A;   HYSTEROSCOPY  2006   POLYPECTOMY  09/19/2015   Procedure: POLYPECTOMY;  Surgeon: Danie Binder, MD;  [facility];  Service: Endoscopy;;  descending colon polyps x3 cold bx, rectal polyp x2   SALPINGOOPHORECTOMY Bilateral 08/18/2012   Procedure: SALPINGO OOPHORECTOMY;  Surgeon: Anastasio Auerbach, MD;  Location: [facility];  Service: Gynecology;  Laterality: Bilateral;   VAGINAL HYSTERECTOMY N/A 08/18/2012   Procedure: HYSTERECTOMY VAGINAL;  Surgeon: Anastasio Auerbach, MD;  Location: [facility];  Service: Gynecology;  Laterality: N/A;      Social History:      Social History   Tobacco Use   Smoking status: Every Day    Packs/day: 1.00    Years: 25.00    Pack years: 25.00    Types: Cigarettes   Smokeless tobacco: Never  Substance Use Topics   Alcohol use: Not Currently    Alcohol/week: 0.0 standard drinks    Comment: rare       Family History :     Family History  Problem Relation Age of Onset   Heart failure Father    Stroke Father    Breast cancer Maternal Aunt        50's   Diabetes Paternal Grandfather     Hypertension Maternal Grandmother    Colon cancer Maternal Aunt    Heart attack Mother       Home Medications:   Prior to Admission medications   Medication Sig Start Date End Date Taking? Authorizing Provider  HYDROcodone-acetaminophen (NORCO) 5-325 MG tablet Take 1 tablet by mouth every  4 (four) hours as needed for moderate pain. 1/61/09  Yes Delora Fuel, MD  predniSONE (DELTASONE) 50 MG tablet Take 1 tablet (50 mg total) by mouth daily. 07/08/52  Yes Delora Fuel, MD  acetaminophen (TYLENOL) 325 MG tablet Take 2 tablets (650 mg total) by mouth every 6 (six) hours as needed for mild pain (or Fever >/= 101). 06/20/17   Isaac Bliss, Rayford Halsted, MD  ondansetron (ZOFRAN) 8 MG tablet Take 1 tablet (8 mg total) by mouth every 8 (eight) hours as needed for nausea. Patient not taking: Reported on 02/23/2021 01/24/21   Kathyrn Drown, MD     Allergies:    No Known Allergies   Physical Exam:   Vitals  Blood pressure 118/78, pulse (!) 56, temperature 97.9 F (36.6 C), temperature source Oral, resp. rate 16, height 5\' 5"  (1.651 m), weight 76.2 kg, last menstrual period 06/12/2012, SpO2 95 %.   1.  General: Patient often walking about the room, visibly exhausted   2. Psychiatric: Alert and oriented x 3, mood and behavior normal for situation, cooperative with exam   3. Neurologic: Speech and language are normal, face is symmetric, moves all 4 extremities voluntarily, at baseline without acute deficits on limited exam   4. HEENMT:  Head is atraumatic, normocephalic, pupils reactive to light, neck is supple, trachea is midline, mucous membranes are moist   5. Respiratory : Lungs are clear to auscultation bilaterally without wheezing, rhonchi, rales, no cyanosis, no increase in work of breathing or accessory muscle use   6. Cardiovascular : Heart rate normal, rhythm is regular, no murmurs, rubs or gallops, no peripheral edema, peripheral pulses palpated   7. Gastrointestinal:   Abdomen is soft, minimally distended and tender in the left lower quadrant at this time, bowel sounds active, no masses or organomegaly palpated   8. Skin:  Skin is warm, dry and intact without rashes, acute lesions, or ulcers on limited exam   9.Musculoskeletal:  No acute deformities or trauma, no asymmetry in tone, no peripheral edema, peripheral pulses palpated, no tenderness to palpation in the extremities     Data Review:    CBC Recent Labs  Lab 02/26/21 2249  WBC 9.9  HGB 15.3*  HCT 48.0*  PLT 279  MCV 94.9  MCH 30.2  MCHC 31.9  RDW 13.3  LYMPHSABS 1.3  MONOABS 0.6  EOSABS 0.2  BASOSABS 0.0   ------------------------------------------------------------------------------------------------------------------  Results for orders placed or performed during the hospital encounter of 02/26/21 (from the past 48 hour(s))  Resp Panel by RT-PCR (Flu A&B, Covid) Nasopharyngeal Swab     Status: None   Collection Time: 02/26/21 10:38 PM   Specimen: Nasopharyngeal Swab; Nasopharyngeal(NP) swabs in vial transport medium  Result Value Ref Range   SARS Coronavirus 2 by RT PCR NEGATIVE NEGATIVE    Comment: (NOTE) SARS-CoV-2 target nucleic acids are NOT DETECTED.  The SARS-CoV-2 RNA is generally detectable in upper respiratory specimens during the acute phase of infection. The lowest concentration of SARS-CoV-2 viral copies this assay can detect is 138 copies/mL. A negative result does not preclude SARS-Cov-2 infection and should not be used as the sole basis for treatment or other patient management decisions. A negative result may occur with  improper specimen collection/handling, submission of specimen other than nasopharyngeal swab, presence of viral mutation(s) within the areas targeted by this assay, and inadequate number of viral copies(<138 copies/mL). A negative result must be combined with clinical observations, patient history, and epidemiological information. The  expected  result is Negative.  Fact Sheet for Patients:  EntrepreneurPulse.com.au  Fact Sheet for Healthcare Providers:  IncredibleEmployment.be  This test is no t yet approved or cleared by the [place] FDA and  has been authorized for detection and/or diagnosis of SARS-CoV-2 by FDA under an Emergency Use Authorization (EUA). This EUA will remain  in effect (meaning this test can be used) for the duration of the COVID-19 declaration under Section 564(b)(1) of the Act, 21 U.S.C.section 360bbb-3(b)(1), unless the authorization is terminated  or revoked sooner.       Influenza A by PCR NEGATIVE NEGATIVE   Influenza B by PCR NEGATIVE NEGATIVE    Comment: (NOTE) The Xpert Xpress SARS-CoV-2/FLU/RSV plus assay is intended as an aid in the diagnosis of influenza from Nasopharyngeal swab specimens and should not be used as a sole basis for treatment. Nasal washings and aspirates are unacceptable for Xpert Xpress SARS-CoV-2/FLU/RSV testing.  Fact Sheet for Patients: EntrepreneurPulse.com.au  Fact Sheet for Healthcare Providers: IncredibleEmployment.be  This test is not yet approved or cleared by the [place] FDA and has been authorized for detection and/or diagnosis of SARS-CoV-2 by FDA under an Emergency Use Authorization (EUA). This EUA will remain in effect (meaning this test can be used) for the duration of the COVID-19 declaration under Section 564(b)(1) of the Act, 21 U.S.C. section 360bbb-3(b)(1), unless the authorization is terminated or revoked.  Performed at [facility], [address] [place], [place] [address]   CBC with Differential     Status: Abnormal   Collection Time: 02/26/21 10:49 PM  Result Value Ref Range   WBC 9.9 4.0 - 10.5 K/uL   RBC 5.06 3.87 - 5.11 MIL/uL   Hemoglobin 15.3 (H) 12.0 - 15.0 g/dL   HCT 48.0 (H) 36.0 - 46.0 %   MCV 94.9 80.0 - 100.0 fL   MCH 30.2 26.0 - 34.0  pg   MCHC 31.9 30.0 - 36.0 g/dL   RDW 13.3 11.5 - 15.5 %   Platelets 279 150 - 400 K/uL   nRBC 0.0 0.0 - 0.2 %   Neutrophils Relative % 79 %   Neutro Abs 7.7 1.7 - 7.7 K/uL   Lymphocytes Relative 13 %   Lymphs Abs 1.3 0.7 - 4.0 K/uL   Monocytes Relative 6 %   Monocytes Absolute 0.6 0.1 - 1.0 K/uL   Eosinophils Relative 2 %   Eosinophils Absolute 0.2 0.0 - 0.5 K/uL   Basophils Relative 0 %   Basophils Absolute 0.0 0.0 - 0.1 K/uL   Immature Granulocytes 0 %   Abs Immature Granulocytes 0.04 0.00 - 0.07 K/uL    Comment: Performed at [facility], [address], [place], [place] [address]  Comprehensive metabolic panel     Status: Abnormal   Collection Time: 02/26/21 10:49 PM  Result Value Ref Range   Sodium 137 135 - 145 mmol/L   Potassium 3.8 3.5 - 5.1 mmol/L   Chloride 108 98 - 111 mmol/L   CO2 23 22 - 32 mmol/L   Glucose, Bld 128 (H) 70 - 99 mg/dL    Comment: Glucose reference range applies only to samples taken after fasting for at least 8 hours.   BUN 13 6 - 20 mg/dL   Creatinine, Ser 0.92 0.44 - 1.00 mg/dL   Calcium 8.5 (L) 8.9 - 10.3 mg/dL   Total Protein 7.0 6.5 - 8.1 g/dL   Albumin 3.8 3.5 - 5.0 g/dL   AST 14 (L) 15 - 41 U/L  ALT 17 0 - 44 U/L   Alkaline Phosphatase 54 38 - 126 U/L   Total Bilirubin 0.5 0.3 - 1.2 mg/dL   GFR, Estimated >60 >60 mL/min    Comment: (NOTE) Calculated using the CKD-EPI Creatinine Equation (2021)    Anion gap 6 5 - 15    Comment: Performed at [facility], [address] [place], [place] [address]  Lipase, blood     Status: None   Collection Time: 02/26/21 10:49 PM  Result Value Ref Range   Lipase 34 11 - 51 U/L    Comment: Performed at [facility], [address], [place], [place] [address]  Urinalysis, Routine w reflex microscopic Urine, Clean Catch     Status: Abnormal   Collection Time: 02/26/21 11:58 PM  Result Value Ref Range   Color, Urine AMBER (A) YELLOW    Comment: BIOCHEMICALS MAY BE AFFECTED BY COLOR   APPearance HAZY  (A) CLEAR   Specific Gravity, Urine 1.031 (H) 1.005 - 1.030   pH 5.0 5.0 - 8.0   Glucose, UA NEGATIVE NEGATIVE mg/dL   Hgb urine dipstick MODERATE (A) NEGATIVE   Bilirubin Urine NEGATIVE NEGATIVE   Ketones, ur 20 (A) NEGATIVE mg/dL   Protein, ur 100 (A) NEGATIVE mg/dL   Nitrite NEGATIVE NEGATIVE   Leukocytes,Ua NEGATIVE NEGATIVE   RBC / HPF 0-5 0 - 5 RBC/hpf   WBC, UA 21-50 0 - 5 WBC/hpf   Bacteria, UA MANY (A) NONE SEEN   Squamous Epithelial / LPF 0-5 0 - 5   Mucus PRESENT    Hyaline Casts, UA PRESENT    Amorphous Crystal PRESENT     Comment: Performed at [facility], [address], [place], [place] [address]    Chemistries  Recent Labs  Lab 02/26/21 2249  NA 137  K 3.8  CL 108  CO2 23  GLUCOSE 128*  BUN 13  CREATININE 0.92  CALCIUM 8.5*  AST 14*  ALT 17  ALKPHOS 54  BILITOT 0.5   ------------------------------------------------------------------------------------------------------------------  ------------------------------------------------------------------------------------------------------------------ GFR: Estimated Creatinine Clearance: 70.6 mL/min (by C-G formula based on SCr of 0.92 mg/dL). Liver Function Tests: Recent Labs  Lab 02/26/21 2249  AST 14*  ALT 17  ALKPHOS 54  BILITOT 0.5  PROT 7.0  ALBUMIN 3.8   Recent Labs  Lab 02/26/21 2249  LIPASE 34   No results for input(s): AMMONIA in the last 168 hours. Coagulation Profile: No results for input(s): INR, PROTIME in the last 168 hours. Cardiac Enzymes: No results for input(s): CKTOTAL, CKMB, CKMBINDEX, TROPONINI in the last 168 hours. BNP (last 3 results) No results for input(s): PROBNP in the last 8760 hours. HbA1C: No results for input(s): HGBA1C in the last 72 hours. CBG: No results for input(s): GLUCAP in the last 168 hours. Lipid Profile: No results for input(s): CHOL, HDL, LDLCALC, TRIG, CHOLHDL, LDLDIRECT in the last 72 hours. Thyroid Function Tests: No results for input(s):  TSH, T4TOTAL, FREET4, T3FREE, THYROIDAB in the last 72 hours. Anemia Panel: No results for input(s): VITAMINB12, FOLATE, FERRITIN, TIBC, IRON, RETICCTPCT in the last 72 hours.  --------------------------------------------------------------------------------------------------------------- Urine analysis:    Component Value Date/Time   COLORURINE AMBER (A) 02/26/2021 2358   APPEARANCEUR HAZY (A) 02/26/2021 2358   LABSPEC 1.031 (H) 02/26/2021 2358   PHURINE 5.0 02/26/2021 2358   GLUCOSEU NEGATIVE 02/26/2021 2358   HGBUR MODERATE (A) 02/26/2021 2358   BILIRUBINUR NEGATIVE 02/26/2021 2358   BILIRUBINUR + 07/01/2012 1002   KETONESUR 20 (A) 02/26/2021 2358  PROTEINUR 100 (A) 02/26/2021 2358   UROBILINOGEN 0.2 10/04/2017 1204   NITRITE NEGATIVE 02/26/2021 2358   LEUKOCYTESUR NEGATIVE 02/26/2021 2358      Imaging Results:    CT ABDOMEN PELVIS W CONTRAST  Result Date: 02/27/2021 CLINICAL DATA:  Generalized abdominal pain, nausea, vomiting, diarrhea EXAM: CT ABDOMEN AND PELVIS WITH CONTRAST TECHNIQUE: Multidetector CT imaging of the abdomen and pelvis was performed using the standard protocol following bolus administration of intravenous contrast. RADIATION DOSE REDUCTION: This exam was performed according to the departmental dose-optimization program which includes automated exposure control, adjustment of the mA and/or kV according to patient size and/or use of iterative reconstruction technique. CONTRAST:  144mL OMNIPAQUE IOHEXOL 300 MG/ML  SOLN COMPARISON:  12/28/2020, 06/19/2017 FINDINGS: Lower chest: Ground-glass opacity within the visualized lung bases bilaterally posteriorly may relate to multifocal infection or aspiration. No pleural effusion. Cardiac size within normal limits. No pericardial effusion. Hepatobiliary: Mild hepatic steatosis. No enhancing intrahepatic mass. No intra or extrahepatic biliary ductal dilation. Gallbladder unremarkable. Pancreas: Unremarkable Spleen: Unremarkable  Adrenals/Urinary Tract: The adrenal glands are unremarkable. The kidneys are normal. The bladder is unremarkable. Stomach/Bowel: There is marked circumferential bowel wall thickening, hyperemia, and mesenteric edema involving the terminal ileum in keeping with a infectious or inflammatory enteritis. The distal jejunum upstream from this long segment of inflamed ileum is dilated and fluid-filled in keeping with a secondary partial small bowel obstruction. Mild free fluid within the pelvis. No free intraperitoneal gas. No loculated intra-abdominal fluid collections. The findings are similar to those noted on 12/28/2020 and inflammatory changes within the ileum appear progressive when compared to remote prior examination of 06/19/2017 and 06/28/2016. The stomach, small bowel, and large bowel are otherwise unremarkable. Appendix normal. Vascular/Lymphatic: No significant vascular findings are present. No enlarged abdominal or pelvic lymph nodes. Reproductive: Status post hysterectomy. No adnexal masses. Other: No abdominal wall hernia.  Rectum unremarkable. Musculoskeletal: No acute bone abnormality. No lytic or blastic bone lesion. IMPRESSION: Severe long segment inflammatory changes involving the terminal ileum in keeping with an infectious or inflammatory enteritis. Given the recurrent and progressive appearance when compared to multiple prior examinations, inflammatory conditions such as Crohn's enterocolitis should be strongly considered. No evidence of perforation. No intra-abdominal abscess formation. Secondary partial small bowel obstruction.  Mild ascites. Bibasilar ground-glass opacity, likely infectious or inflammatory in nature. Mild hepatic steatosis. Electronically Signed   By: Fidela Salisbury M.D.   On: 02/27/2021 02:32       Assessment & Plan:    Principal Problem:   Partial small bowel obstruction (HCC) Active Problems:   Tobacco abuse   Acute lower UTI   Partial small bowel  obstruction Likely secondary to Crohn's/adhesions N.p.o. except for sips and chips No NG tube indicated at this time as patient is not vomiting Consults General surgery Pain control with morphine Consider NG tube if patient becomes nauseous or starts vomiting, or if abdomen becomes more distended Monitor progress with KUB in the a.m. Continue to monitor Enteritis Inflammatory versus infectious but Crohn's should be considered per radiology read Steroids started in the ED, continue steroid Consult GI Cover with Rocephin and Flagyl for potential infectious etiology N.p.o. as above Continue to monitor UTI UA shows many bacteria, 21-50 white blood cells Urine culture pending Start Rocephin Continue to monitor Tobacco use disorder Counseled patient importance of cessation Patient declines nicotine patch at this time   DVT Prophylaxis-   Heparin - SCDs   AM Labs Ordered, also please review Full Orders  Family  Communication: No family at bedside  Code Status: Full  Admission status:Inpatient :The appropriate admission status for this patient is INPATIENT. Inpatient status is judged to be reasonable and necessary in order to provide the required intensity of service to ensure the patient's safety. The patient's presenting symptoms, physical exam findings, and initial radiographic and laboratory data in the context of their chronic comorbidities is felt to place them at high risk for further clinical deterioration. Furthermore, it is not anticipated that the patient will be medically stable for discharge from the hospital within 2 midnights of admission. The following factors support the admission status of inpatient.     The patient's presenting symptoms include diarrhea and vomiting with abdominal pain. The worrisome physical exam findings include abdominal distention and abdominal tenderness. The initial radiographic and laboratory data are worrisome because of enteritis that is  inflammatory versus infectious, with consideration of Crohn's and partial small bowel obstruction. The chronic co-morbidities include anxiety and tobacco use disorder.       * I certify that at the point of admission it is my clinical judgment that the patient will require inpatient hospital care spanning beyond 2 midnights from the point of admission due to high intensity of service, high risk for further deterioration and high frequency of surveillance required.*  Disposition: Anticipated Discharge date 48-72 hours discharge to home  Time spent in minutes : [place] DO

## 2021-02-27 NOTE — Consult Note (Signed)
[facility] Consult  Reason for Consult: Partial small bowel obstruction Referring Physician: Carlynn Purl, DO  Chief Complaint   Abdominal Pain     HPI: Ruth Robertson is a 56 y.o. female who presents with a 2-day history of abdominal pain, nausea, vomiting, and diarrhea.  She states that this is similar to other episodes of enteritis that she has had in the past, though this episode, her abdominal pain is more severe.  She states that she had multiple episodes of vomiting on Sunday, and 1 episode yesterday.  She denies any further episodes of emesis since her admission to the hospital.  She currently denies nausea as well.  She complains of bilateral lower abdominal pain, which is sharp in nature.  She has an extensive history of enteritis episodes since 2017, and she has undergone a thorough evaluation with GI.  She states that she had no episodes for 2 years, but starting Thanksgiving, she has been having episodes about every 3 weeks.  She denies history of abdominal surgeries.  She denies use of blood thinning medications.  She underwent CT abdomen and pelvis which demonstrated severe long segment of inflammatory changes involving the terminal ileum, concerning for infectious versus inflammatory enteritis; secondary partial small bowel obstruction related to enteritis.  She has no leukocytosis, WBC 9.9 today.  Past Medical History:  Diagnosis Date   Anxiety    Asthma    ONLY WITH BRONCHITIS   GAD (generalized anxiety disorder)    GERD (gastroesophageal reflux disease)    Microscopic hematuria 12/03/2017   Negative cystoscopy negative CAT scan   Pneumonia    PONV (postoperative nausea and vomiting)     Past Surgical History:  Procedure Laterality Date   AGILE CAPSULE N/A 07/09/2016   Procedure: AGILE CAPSULE;  Surgeon: Danie Binder, MD;  [facility];  Service: Endoscopy;  Laterality: N/A;  7:30am, pt to arrive at 7:00am   BIOPSY   09/19/2015   Procedure: BIOPSY;  Surgeon: Danie Binder, MD;  [facility];  Service: Endoscopy;;  random colon bx's   COLONOSCOPY WITH PROPOFOL N/A 09/19/2015   Dr. Oneida Alar: five 2-3 mm polyps in rectum (hyperplastic) and descending colon, diverticulosis in sigmoid colon and transverse colon. Query post-infectious IBS. Next colonoscopy in 10 years    CYST EXCISION N/A 01/20/2019   Procedure: EXCISION OF RIGHT GROIN SEBACEOUS CYST;  Surgeon: Erroll Luna, MD;  Location: [facility];  Service: General;  Laterality: N/A;   GIVENS CAPSULE STUDY N/A 07/30/2016   Procedure: GIVENS CAPSULE STUDY;  Surgeon: Danie Binder, MD;  [facility];  Service: Endoscopy;  Laterality: N/A;   HYSTEROSCOPY  2006   POLYPECTOMY  09/19/2015   Procedure: POLYPECTOMY;  Surgeon: Danie Binder, MD;  [facility];  Service: Endoscopy;;  descending colon polyps x3 cold bx, rectal polyp x2   SALPINGOOPHORECTOMY Bilateral 08/18/2012   Procedure: SALPINGO OOPHORECTOMY;  Surgeon: Anastasio Auerbach, MD;  Location: [facility];  Service: Gynecology;  Laterality: Bilateral;   VAGINAL HYSTERECTOMY N/A 08/18/2012   Procedure: HYSTERECTOMY VAGINAL;  Surgeon: Anastasio Auerbach, MD;  Location: [facility];  Service: Gynecology;  Laterality: N/A;    Family History  Problem Relation Age of Onset   Heart attack Mother    Heart failure Father    Stroke Father    Hypertension Maternal Grandmother    Diabetes Paternal Grandfather    Breast cancer Maternal Aunt  64's   Colon cancer Maternal Aunt        late 18s or 70s   Inflammatory bowel disease Neg Hx     Social History   Tobacco Use   Smoking status: Every Day    Packs/day: 1.00    Years: 25.00    Pack years: 25.00    Types: Cigarettes   Smokeless tobacco: Never  Vaping Use   Vaping Use: Never used  Substance Use Topics   Alcohol use: Not Currently    Alcohol/week: 0.0 standard drinks    Comment: rare   Drug use: No     Medications: I have reviewed the patient's current medications.  No Known Allergies   ROS:  Constitutional: negative for chills, fatigue, and fevers Respiratory: negative for cough and shortness of breath Cardiovascular: negative for chest pain and palpitations Gastrointestinal: positive for abdominal pain, negative for nausea and vomiting  Blood pressure 118/78, pulse (!) 56, temperature 97.9 F (36.6 C), temperature source Oral, resp. rate 16, height 5\' 5"  (1.651 m), weight 75.5 kg, last menstrual period 06/12/2012, SpO2 95 %. Physical Exam Vitals reviewed.  Constitutional:      Appearance: She is well-developed.  Eyes:     Extraocular Movements: Extraocular movements intact.     Pupils: Pupils are equal, round, and reactive to light.  Cardiovascular:     Rate and Rhythm: Normal rate.  Pulmonary:     Effort: Pulmonary effort is normal.     Breath sounds: Normal breath sounds.  Abdominal:     Comments: Soft, nondistended, no percussion tenderness, tenderness to palpation in bilateral lower quadrants, right worse than left; no rigidity, guarding, rebound tenderness  Skin:    General: Skin is warm and dry.  Neurological:     General: No focal deficit present.     Mental Status: She is alert and oriented to person, place, and time.  Psychiatric:        Mood and Affect: Mood normal.        Behavior: Behavior normal.    Results: Results for orders placed or performed during the hospital encounter of 02/26/21 (from the past 48 hour(s))  Resp Panel by RT-PCR (Flu A&B, Covid) Nasopharyngeal Swab     Status: None   Collection Time: 02/26/21 10:38 PM   Specimen: Nasopharyngeal Swab; Nasopharyngeal(NP) swabs in vial transport medium  Result Value Ref Range   SARS Coronavirus 2 by RT PCR NEGATIVE NEGATIVE    Comment: (NOTE) SARS-CoV-2 target nucleic acids are NOT DETECTED.  The SARS-CoV-2 RNA is generally detectable in upper respiratory specimens during the acute phase of  infection. The lowest concentration of SARS-CoV-2 viral copies this assay can detect is 138 copies/mL. A negative result does not preclude SARS-Cov-2 infection and should not be used as the sole basis for treatment or other patient management decisions. A negative result may occur with  improper specimen collection/handling, submission of specimen other than nasopharyngeal swab, presence of viral mutation(s) within the areas targeted by this assay, and inadequate number of viral copies(<138 copies/mL). A negative result must be combined with clinical observations, patient history, and epidemiological information. The expected result is Negative.  Fact Sheet for Patients:  EntrepreneurPulse.com.au  Fact Sheet for Healthcare Providers:  IncredibleEmployment.be  This test is no t yet approved or cleared by the [place] FDA and  has been authorized for detection and/or diagnosis of SARS-CoV-2 by FDA under an Emergency Use Authorization (EUA). This EUA will remain  in effect (meaning this test can  be used) for the duration of the COVID-19 declaration under Section 564(b)(1) of the Act, 21 U.S.C.section 360bbb-3(b)(1), unless the authorization is terminated  or revoked sooner.       Influenza A by PCR NEGATIVE NEGATIVE   Influenza B by PCR NEGATIVE NEGATIVE    Comment: (NOTE) The Xpert Xpress SARS-CoV-2/FLU/RSV plus assay is intended as an aid in the diagnosis of influenza from Nasopharyngeal swab specimens and should not be used as a sole basis for treatment. Nasal washings and aspirates are unacceptable for Xpert Xpress SARS-CoV-2/FLU/RSV testing.  Fact Sheet for Patients: EntrepreneurPulse.com.au  Fact Sheet for Healthcare Providers: IncredibleEmployment.be  This test is not yet approved or cleared by the [place] FDA and has been authorized for detection and/or diagnosis of SARS-CoV-2 by FDA  under an Emergency Use Authorization (EUA). This EUA will remain in effect (meaning this test can be used) for the duration of the COVID-19 declaration under Section 564(b)(1) of the Act, 21 U.S.C. section 360bbb-3(b)(1), unless the authorization is terminated or revoked.  Performed at [facility] [address], [place], [place] [address]   CBC with Differential     Status: Abnormal   Collection Time: 02/26/21 10:49 PM  Result Value Ref Range   WBC 9.9 4.0 - 10.5 K/uL   RBC 5.06 3.87 - 5.11 MIL/uL   Hemoglobin 15.3 (H) 12.0 - 15.0 g/dL   HCT 48.0 (H) 36.0 - 46.0 %   MCV 94.9 80.0 - 100.0 fL   MCH 30.2 26.0 - 34.0 pg   MCHC 31.9 30.0 - 36.0 g/dL   RDW 13.3 11.5 - 15.5 %   Platelets 279 150 - 400 K/uL   nRBC 0.0 0.0 - 0.2 %   Neutrophils Relative % 79 %   Neutro Abs 7.7 1.7 - 7.7 K/uL   Lymphocytes Relative 13 %   Lymphs Abs 1.3 0.7 - 4.0 K/uL   Monocytes Relative 6 %   Monocytes Absolute 0.6 0.1 - 1.0 K/uL   Eosinophils Relative 2 %   Eosinophils Absolute 0.2 0.0 - 0.5 K/uL   Basophils Relative 0 %   Basophils Absolute 0.0 0.0 - 0.1 K/uL   Immature Granulocytes 0 %   Abs Immature Granulocytes 0.04 0.00 - 0.07 K/uL    Comment: Performed at [facility], [address] [place], [place] [address]  Comprehensive metabolic panel     Status: Abnormal   Collection Time: 02/26/21 10:49 PM  Result Value Ref Range   Sodium 137 135 - 145 mmol/L   Potassium 3.8 3.5 - 5.1 mmol/L   Chloride 108 98 - 111 mmol/L   CO2 23 22 - 32 mmol/L   Glucose, Bld 128 (H) 70 - 99 mg/dL    Comment: Glucose reference range applies only to samples taken after fasting for at least 8 hours.   BUN 13 6 - 20 mg/dL   Creatinine, Ser 0.92 0.44 - 1.00 mg/dL   Calcium 8.5 (L) 8.9 - 10.3 mg/dL   Total Protein 7.0 6.5 - 8.1 g/dL   Albumin 3.8 3.5 - 5.0 g/dL   AST 14 (L) 15 - 41 U/L   ALT 17 0 - 44 U/L   Alkaline Phosphatase 54 38 - 126 U/L   Total Bilirubin 0.5 0.3 - 1.2 mg/dL   GFR, Estimated >60 >60  mL/min    Comment: (NOTE) Calculated using the CKD-EPI Creatinine Equation (2021)    Anion gap 6 5 - 15    Comment: Performed at [facility], [address] [place], [place] [address]  Lipase, blood     Status: None   Collection Time: 02/26/21 10:49 PM  Result Value Ref Range   Lipase 34 11 - 51 U/L    Comment: Performed at [facility], [address] [place], [place] [address]  Urinalysis, Routine w reflex microscopic Urine, Clean Catch     Status: Abnormal   Collection Time: 02/26/21 11:58 PM  Result Value Ref Range   Color, Urine AMBER (A) YELLOW    Comment: BIOCHEMICALS MAY BE AFFECTED BY COLOR   APPearance HAZY (A) CLEAR   Specific Gravity, Urine 1.031 (H) 1.005 - 1.030   pH 5.0 5.0 - 8.0   Glucose, UA NEGATIVE NEGATIVE mg/dL   Hgb urine dipstick MODERATE (A) NEGATIVE   Bilirubin Urine NEGATIVE NEGATIVE   Ketones, ur 20 (A) NEGATIVE mg/dL   Protein, ur 100 (A) NEGATIVE mg/dL   Nitrite NEGATIVE NEGATIVE   Leukocytes,Ua NEGATIVE NEGATIVE   RBC / HPF 0-5 0 - 5 RBC/hpf   WBC, UA 21-50 0 - 5 WBC/hpf   Bacteria, UA MANY (A) NONE SEEN   Squamous Epithelial / LPF 0-5 0 - 5   Mucus PRESENT    Hyaline Casts, UA PRESENT    Amorphous Crystal PRESENT     Comment: Performed at [facility], [address], [place],  [address]    CT ABDOMEN PELVIS W CONTRAST  Result Date: 02/27/2021 CLINICAL DATA:  Generalized abdominal pain, nausea, vomiting, diarrhea EXAM: CT ABDOMEN AND PELVIS WITH CONTRAST TECHNIQUE: Multidetector CT imaging of the abdomen and pelvis was performed using the standard protocol following bolus administration of intravenous contrast. RADIATION DOSE REDUCTION: This exam was performed according to the departmental dose-optimization program which includes automated exposure control, adjustment of the mA and/or kV according to patient size and/or use of iterative reconstruction technique. CONTRAST:  134mL OMNIPAQUE IOHEXOL 300 MG/ML  SOLN COMPARISON:  12/28/2020,  06/19/2017 FINDINGS: Lower chest: Ground-glass opacity within the visualized lung bases bilaterally posteriorly may relate to multifocal infection or aspiration. No pleural effusion. Cardiac size within normal limits. No pericardial effusion. Hepatobiliary: Mild hepatic steatosis. No enhancing intrahepatic mass. No intra or extrahepatic biliary ductal dilation. Gallbladder unremarkable. Pancreas: Unremarkable Spleen: Unremarkable Adrenals/Urinary Tract: The adrenal glands are unremarkable. The kidneys are normal. The bladder is unremarkable. Stomach/Bowel: There is marked circumferential bowel wall thickening, hyperemia, and mesenteric edema involving the terminal ileum in keeping with a infectious or inflammatory enteritis. The distal jejunum upstream from this long segment of inflamed ileum is dilated and fluid-filled in keeping with a secondary partial small bowel obstruction. Mild free fluid within the pelvis. No free intraperitoneal gas. No loculated intra-abdominal fluid collections. The findings are similar to those noted on 12/28/2020 and inflammatory changes within the ileum appear progressive when compared to remote prior examination of 06/19/2017 and 06/28/2016. The stomach, small bowel, and large bowel are otherwise unremarkable. Appendix normal. Vascular/Lymphatic: No significant vascular findings are present. No enlarged abdominal or pelvic lymph nodes. Reproductive: Status post hysterectomy. No adnexal masses. Other: No abdominal wall hernia.  Rectum unremarkable. Musculoskeletal: No acute bone abnormality. No lytic or blastic bone lesion. IMPRESSION: Severe long segment inflammatory changes involving the terminal ileum in keeping with an infectious or inflammatory enteritis. Given the recurrent and progressive appearance when compared to multiple prior examinations, inflammatory conditions such as Crohn's enterocolitis should be strongly considered. No evidence of perforation. No intra-abdominal  abscess formation. Secondary partial small bowel obstruction.  Mild ascites. Bibasilar ground-glass opacity, likely infectious  or inflammatory in nature. Mild hepatic steatosis. Electronically Signed   By: Fidela Salisbury M.D.   On: 02/27/2021 02:32     Assessment & Plan:  Ruth Robertson is a 56 y.o. female who was admitted with enteritis of the terminal ileum with resulting partial small bowel obstruction.  No leukocytosis, WBC 9.9.  -Imaging evaluated by myself, enteritis noted of terminal ileum with resultant mild dilation of proximal small bowel -Patient without nausea and vomiting at this time -Will hold off on NG tube placement; would consider placement if patient starts having multiple episodes of emesis -No plans for surgical intervention at this time, will continue to follow in the event patient does need a surgical intervention -NPO -Antibiotics per primary team/GI -Solumedrol per primary team/GI for possible Crohn's enteritis -Appreciate GI recommendations -Care per primary team  All questions were answered to the satisfaction of the patient.   -- Graciella Freer, DO [facility] [address] [place], [place] [address] [phone] (office)

## 2021-02-27 NOTE — TOC Progression Note (Signed)
°  Transition of Care [facility]) Screening Note   Patient Details  Name: ARMONII SIEH Date of Birth: 12-21-1965   Transition of Care [facility]) CM/SW Contact:    Shade Flood, LCSW Phone Number: 02/27/2021, 10:28 AM    Transition of Care Department [facility]) has reviewed patient and no TOC needs have been identified at this time. We will continue to monitor patient advancement through interdisciplinary progression rounds. If new patient transition needs arise, please place a TOC consult.

## 2021-02-28 ENCOUNTER — Inpatient Hospital Stay (HOSPITAL_COMMUNITY): Payer: 59

## 2021-02-28 ENCOUNTER — Ambulatory Visit: Payer: 59

## 2021-02-28 DIAGNOSIS — Z720 Tobacco use: Secondary | ICD-10-CM

## 2021-02-28 LAB — GASTROINTESTINAL PANEL BY PCR, STOOL (REPLACES STOOL CULTURE)

## 2021-02-28 LAB — COMPREHENSIVE METABOLIC PANEL
ALT: 17 U/L (ref 0–44)
AST: 13 U/L — ABNORMAL LOW (ref 15–41)
Albumin: 3.4 g/dL — ABNORMAL LOW (ref 3.5–5.0)
Alkaline Phosphatase: 47 U/L (ref 38–126)
Anion gap: 8 (ref 5–15)
BUN: 17 mg/dL (ref 6–20)
CO2: 22 mmol/L (ref 22–32)
Calcium: 8.2 mg/dL — ABNORMAL LOW (ref 8.9–10.3)
Chloride: 109 mmol/L (ref 98–111)
Creatinine, Ser: 0.94 mg/dL (ref 0.44–1.00)
GFR, Estimated: >60 mL/min (ref >60)
Glucose, Bld: 107 mg/dL — ABNORMAL HIGH (ref 70–99)
Potassium: 4 mmol/L (ref 3.5–5.1)
Sodium: 139 mmol/L (ref 135–145)
Total Bilirubin: 0.3 mg/dL (ref 0.3–1.2)
Total Protein: 6.3 g/dL — ABNORMAL LOW (ref 6.5–8.1)

## 2021-02-28 LAB — CBC WITH DIFFERENTIAL/PLATELET
Abs Immature Granulocytes: 0.05 10*3/uL (ref 0.00–0.07)
Basophils Absolute: 0 10*3/uL (ref 0.0–0.1)
Basophils Relative: 0 %
Eosinophils Absolute: 0 10*3/uL (ref 0.0–0.5)
Eosinophils Relative: 0 %
HCT: 41.6 % (ref 36.0–46.0)
Hemoglobin: 13.5 g/dL (ref 12.0–15.0)
Immature Granulocytes: 1 %
Lymphocytes Relative: 9 %
Lymphs Abs: 1 10*3/uL (ref 0.7–4.0)
MCH: 31.5 pg (ref 26.0–34.0)
MCHC: 32.5 g/dL (ref 30.0–36.0)
MCV: 97 fL (ref 80.0–100.0)
Monocytes Absolute: 0.4 10*3/uL (ref 0.1–1.0)
Monocytes Relative: 4 %
Neutro Abs: 9.1 10*3/uL — ABNORMAL HIGH (ref 1.7–7.7)
Neutrophils Relative %: 86 %
Platelets: 239 10*3/uL (ref 150–400)
RBC: 4.29 MIL/uL (ref 3.87–5.11)
RDW: 13.6 % (ref 11.5–15.5)
WBC: 10.6 10*3/uL — ABNORMAL HIGH (ref 4.0–10.5)
nRBC: 0 % (ref 0.0–0.2)

## 2021-02-28 LAB — IRON AND TIBC
Iron: 111 ug/dL (ref 28–170)
Saturation Ratios: 33 % — ABNORMAL HIGH (ref 10.4–31.8)
TIBC: 338 ug/dL (ref 250–450)
UIBC: 227 ug/dL

## 2021-02-28 LAB — HEPATITIS B SURFACE ANTIGEN: Hepatitis B Surface Ag: NONREACTIVE

## 2021-02-28 LAB — FERRITIN: Ferritin: 42 ng/mL (ref 11–307)

## 2021-02-28 LAB — MAGNESIUM: Magnesium: 2 mg/dL (ref 1.7–2.4)

## 2021-02-28 MED ORDER — PEG 3350-KCL-NA BICARB-NACL 420 G PO SOLR
4000.0000 mL | Freq: Once | ORAL | Status: AC
  Administered 2021-03-01: 4000 mL via ORAL

## 2021-02-28 NOTE — Progress Notes (Signed)
Subjective: Patient reports some nausea this morning without vomiting, has had 3-4 loose. BMs yellow to green in color without rectal bleeding or melena. Reports abdomen is pretty sore as well as abdominal pain in mid epigastric area and RLQ. She reports she has not had anything other than ice water this morning. Is not feeling very hungry. Does endorse a lot of belching, but this has been ongoing.   She tells me that she has episodes similar to current one about every 4 months since 2017. She will have nausea, vomiting and diarrhea that occurs almost hourly. She denies ever having fevers or chills in the past.   Objective: Vital signs in last 24 hours: Temp:  [98.2 F (36.8 C)-98.4 F (36.9 C)] 98.4 F (36.9 C) (01/25 0511) Pulse Rate:  [60-87] 87 (01/25 0511) Resp:  [17-18] 18 (01/25 0511) BP: (97-108)/(66-71) 97/71 (01/25 0511) SpO2:  [93 %-95 %] 95 % (01/25 0511) Last BM Date: 02/27/21 General:   Alert and oriented, pleasant Head:  Normocephalic and atraumatic. Eyes:  No icterus, sclera clear. Conjuctiva pink.  Mouth:  Without lesions, mucosa pink and moist.  Neck:  Supple, without thyromegaly or masses.  Heart:  S1, S2 present, no murmurs noted.  Lungs: Clear to auscultation bilaterally, without wheezing, rales, or rhonchi.  Abdomen:  Bowel sounds present, soft, but tender RLQ and epigastric region, non-distended. No HSM or hernias noted. No rebound or guarding. No masses appreciated  Msk:  Symmetrical without gross deformities. Normal posture. Pulses:  Normal pulses noted. Extremities:  Without clubbing or edema. Neurologic:  Alert and  oriented x4;  grossly normal neurologically. Skin:  Warm and dry, intact without significant lesions.  Psych:  Alert and cooperative. Normal mood and affect.  Intake/Output from previous day: 01/24 0701 - 01/25 0700 In: 1190.9 [I.V.:1074; IV Piggyback:116.9] Out: -  Intake/Output this shift: No intake/output data recorded.  Lab  Results: Recent Labs    02/26/21 2249  WBC 9.9  HGB 15.3*  HCT 48.0*  PLT 279   BMET Recent Labs    02/26/21 2249  NA 137  K 3.8  CL 108  CO2 23  GLUCOSE 128*  BUN 13  CREATININE 0.92  CALCIUM 8.5*   LFT Recent Labs    02/26/21 2249  PROT 7.0  ALBUMIN 3.8  AST 14*  ALT 17  ALKPHOS 54  BILITOT 0.5    Studies/Results: CT ABDOMEN PELVIS W CONTRAST  Result Date: 02/27/2021 CLINICAL DATA:  Generalized abdominal pain, nausea, vomiting, diarrhea EXAM: CT ABDOMEN AND PELVIS WITH CONTRAST TECHNIQUE: Multidetector CT imaging of the abdomen and pelvis was performed using the standard protocol following bolus administration of intravenous contrast. RADIATION DOSE REDUCTION: This exam was performed according to the departmental dose-optimization program which includes automated exposure control, adjustment of the mA and/or kV according to patient size and/or use of iterative reconstruction technique. CONTRAST:  135mL OMNIPAQUE IOHEXOL 300 MG/ML  SOLN COMPARISON:  12/28/2020, 06/19/2017 FINDINGS: Lower chest: Ground-glass opacity within the visualized lung bases bilaterally posteriorly may relate to multifocal infection or aspiration. No pleural effusion. Cardiac size within normal limits. No pericardial effusion. Hepatobiliary: Mild hepatic steatosis. No enhancing intrahepatic mass. No intra or extrahepatic biliary ductal dilation. Gallbladder unremarkable. Pancreas: Unremarkable Spleen: Unremarkable Adrenals/Urinary Tract: The adrenal glands are unremarkable. The kidneys are normal. The bladder is unremarkable. Stomach/Bowel: There is marked circumferential bowel wall thickening, hyperemia, and mesenteric edema involving the terminal ileum in keeping with a infectious or inflammatory enteritis. The distal jejunum upstream from this  long segment of inflamed ileum is dilated and fluid-filled in keeping with a secondary partial small bowel obstruction. Mild free fluid within the pelvis. No free  intraperitoneal gas. No loculated intra-abdominal fluid collections. The findings are similar to those noted on 12/28/2020 and inflammatory changes within the ileum appear progressive when compared to remote prior examination of 06/19/2017 and 06/28/2016. The stomach, small bowel, and large bowel are otherwise unremarkable. Appendix normal. Vascular/Lymphatic: No significant vascular findings are present. No enlarged abdominal or pelvic lymph nodes. Reproductive: Status post hysterectomy. No adnexal masses. Other: No abdominal wall hernia.  Rectum unremarkable. Musculoskeletal: No acute bone abnormality. No lytic or blastic bone lesion. IMPRESSION: Severe long segment inflammatory changes involving the terminal ileum in keeping with an infectious or inflammatory enteritis. Given the recurrent and progressive appearance when compared to multiple prior examinations, inflammatory conditions such as Crohn's enterocolitis should be strongly considered. No evidence of perforation. No intra-abdominal abscess formation. Secondary partial small bowel obstruction.  Mild ascites. Bibasilar ground-glass opacity, likely infectious or inflammatory in nature. Mild hepatic steatosis. Electronically Signed   By: Fidela Salisbury M.D.   On: 02/27/2021 02:32   Acute Abdominal Series  Result Date: 02/28/2021 CLINICAL DATA:  Nausea, vomiting and diarrhea. EXAM: DG ABDOMEN ACUTE WITH 1 VIEW CHEST COMPARISON:  CT abdomen pelvis 02/27/2021 and chest radiograph 01/19/2020. FINDINGS: Frontal view of the chest shows midline trachea. Heart size stable. Platelike density in the right perihilar region. Bibasilar streaky opacification, left greater than right. Probable small left pleural effusion. Two views of the abdomen show gaseous distention of small bowel with gas in nondilated colon and rectum. No unexpected radiopaque calculi. IMPRESSION: 1. Gaseous distention of small bowel with gas throughout nondilated colon and rectum, findings  indicative of a persistent partial small bowel obstruction. 2. Right perihilar and bibasilar subsegmental atelectasis with a tiny left pleural effusion. Electronically Signed   By: Lorin Picket M.D.   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  Enteritis with partial SBO: Intermittent episodes of diarrhea and abdominal pain since 2017, evaluated at [facility] and [facility], imaging consistent with enteritis multiple times which has resolved on its own, unclear why she continues to have small bowel inflammation. Differentials include possible Crohn's disease, vs possiblity of hereditary angioedema, less likely TB or other infectious etiology.C diff testing negative. GI path panel negative. Unclear why patient continues to have recurrent episodes. Hep B Ag and Quantiferon still pending. No previous alpha gal testing, celiac testing negative as well as other autoimmune serologies. WBC up to 10.6 this morning, likely secondary to steroid initiation. Repeat abd xray with gaseous distention of small bowel with gas throughout nondilated colon and rectum, indicative of persistent partial SBO. Evaluated by general surgery today with no plans for surgical intervention at this time, possible diagnostic laparoscopy for further evaluation would likely be done on outpatient basis.    Plan: Continue solumedrol at 60mg  daily Colonoscopy with TI biopsies this admission once tolerating PO Clear liquid diet Consider alpha gal testing PPI daily   LOS: 1 day    02/28/2021, 9:16 AM  Caidynce Muzyka L. Alver Sorrow, MSN, APRN,  AGNP-C  Adult-Gerontology Nurse Practitioner [facility]

## 2021-02-28 NOTE — Progress Notes (Signed)
[facility] Progress Note     Subjective: Patient seen and examined.  She is resting comfortably in bed.  She states that she has been belching and passing flatus a lot, and started having diarrhea again today.  She was only able to tolerate some of the clears, as they caused her increased abdominal pain with a little bit of nausea.  She denies any episodes of emesis.  She is concerned that she will not be able to tolerate a prep for colonoscopy.  Objective: Vital signs in last 24 hours: Temp:  [98.2 F (36.8 C)-98.4 F (36.9 C)] 98.4 F (36.9 C) (01/25 0511) Pulse Rate:  [60-87] 87 (01/25 0511) Resp:  [17-18] 18 (01/25 0511) BP: (97-108)/(66-71) 97/71 (01/25 0511) SpO2:  [93 %-95 %] 95 % (01/25 0511) Last BM Date: 02/27/21  Intake/Output from previous day: 01/24 0701 - 01/25 0700 In: 1190.9 [I.V.:1074; IV Piggyback:116.9] Out: -  Intake/Output this shift: No intake/output data recorded.  General appearance: alert, cooperative, and no distress GI: Soft, nondistended, no percussion tenderness, tenderness to palpation in the right lower quadrant, no rigidity, guarding, rebound tenderness  Lab Results:  Recent Labs    02/26/21 2249 02/28/21 1003  WBC 9.9 10.6*  HGB 15.3* 13.5  HCT 48.0* 41.6  PLT 279 239   BMET Recent Labs    02/26/21 2249 02/28/21 1003  NA 137 139  K 3.8 4.0  CL 108 109  CO2 23 22  GLUCOSE 128* 107*  BUN 13 17  CREATININE 0.92 0.94  CALCIUM 8.5* 8.2*   PT/INR No results for input(s): LABPROT, INR in the last 72 hours.  Studies/Results: CT ABDOMEN PELVIS W CONTRAST  Result Date: 02/27/2021 CLINICAL DATA:  Generalized abdominal pain, nausea, vomiting, diarrhea EXAM: CT ABDOMEN AND PELVIS WITH CONTRAST TECHNIQUE: Multidetector CT imaging of the abdomen and pelvis was performed using the standard protocol following bolus administration of intravenous contrast. RADIATION DOSE REDUCTION: This exam was performed according to the  departmental dose-optimization program which includes automated exposure control, adjustment of the mA and/or kV according to patient size and/or use of iterative reconstruction technique. CONTRAST:  193mL OMNIPAQUE IOHEXOL 300 MG/ML  SOLN COMPARISON:  12/28/2020, 06/19/2017 FINDINGS: Lower chest: Ground-glass opacity within the visualized lung bases bilaterally posteriorly may relate to multifocal infection or aspiration. No pleural effusion. Cardiac size within normal limits. No pericardial effusion. Hepatobiliary: Mild hepatic steatosis. No enhancing intrahepatic mass. No intra or extrahepatic biliary ductal dilation. Gallbladder unremarkable. Pancreas: Unremarkable Spleen: Unremarkable Adrenals/Urinary Tract: The adrenal glands are unremarkable. The kidneys are normal. The bladder is unremarkable. Stomach/Bowel: There is marked circumferential bowel wall thickening, hyperemia, and mesenteric edema involving the terminal ileum in keeping with a infectious or inflammatory enteritis. The distal jejunum upstream from this long segment of inflamed ileum is dilated and fluid-filled in keeping with a secondary partial small bowel obstruction. Mild free fluid within the pelvis. No free intraperitoneal gas. No loculated intra-abdominal fluid collections. The findings are similar to those noted on 12/28/2020 and inflammatory changes within the ileum appear progressive when compared to remote prior examination of 06/19/2017 and 06/28/2016. The stomach, small bowel, and large bowel are otherwise unremarkable. Appendix normal. Vascular/Lymphatic: No significant vascular findings are present. No enlarged abdominal or pelvic lymph nodes. Reproductive: Status post hysterectomy. No adnexal masses. Other: No abdominal wall hernia.  Rectum unremarkable. Musculoskeletal: No acute bone abnormality. No lytic or blastic bone lesion. IMPRESSION: Severe long segment inflammatory changes involving the terminal ileum in keeping with an  infectious  or inflammatory enteritis. Given the recurrent and progressive appearance when compared to multiple prior examinations, inflammatory conditions such as Crohn's enterocolitis should be strongly considered. No evidence of perforation. No intra-abdominal abscess formation. Secondary partial small bowel obstruction.  Mild ascites. Bibasilar ground-glass opacity, likely infectious or inflammatory in nature. Mild hepatic steatosis. Electronically Signed   By: Fidela Salisbury M.D.   On: 02/27/2021 02:32   Acute Abdominal Series  Result Date: 02/28/2021 CLINICAL DATA:  Nausea, vomiting and diarrhea. EXAM: DG ABDOMEN ACUTE WITH 1 VIEW CHEST COMPARISON:  CT abdomen pelvis 02/27/2021 and chest radiograph 01/19/2020. FINDINGS: Frontal view of the chest shows midline trachea. Heart size stable. Platelike density in the right perihilar region. Bibasilar streaky opacification, left greater than right. Probable small left pleural effusion. Two views of the abdomen show gaseous distention of small bowel with gas in nondilated colon and rectum. No unexpected radiopaque calculi. IMPRESSION: 1. Gaseous distention of small bowel with gas throughout nondilated colon and rectum, findings indicative of a persistent partial small bowel obstruction. 2. Right perihilar and bibasilar subsegmental atelectasis with a tiny left pleural effusion. Electronically Signed   By: Lorin Picket M.D.   On: 02/28/2021 08:26    Anti-infectives: Anti-infectives (From admission, onward)    Start     Dose/Rate Route Frequency Ordered Stop   02/27/21 0415  cefTRIAXone (ROCEPHIN) 1 g in sodium chloride 0.9 % 100 mL IVPB        1 g 200 mL/hr over 30 Minutes Intravenous Every 24 hours 02/27/21 0402     02/27/21 0415  metroNIDAZOLE (FLAGYL) IVPB 500 mg        500 mg 100 mL/hr over 60 Minutes Intravenous Every 12 hours 02/27/21 0402         Assessment/Plan: Ruth Robertson is a 56 year old female who was admitted with  enteritis of the terminal ileum with resulting partial small bowel obstruction.  -Acute abdominal series obtained today, evaluated by myself, demonstrating distention of small bowel with gas throughout the colon and rectum -Small bowel distention is likely secondary to terminal ileum enteritis resulting with decreased flow through the area, however patient is passing flatus and continues to have bowel movements which means that this area is not fully obstructed -Diet per GI, patient currently on clears.  From a surgical standpoint, I would recommend maintaining the patient on clears given nausea with consumption -Plan for GI to perform colonoscopy at some point to evaluate the terminal ileum -No acute surgical intervention at this time -If patient requires diagnostic laparoscopy to evaluate this area, would recommend this to be performed as an outpatient -Appreciate GI recommendations -Care per primary team  All questions were answered to the satisfaction of the patient.  LOS: 1 day    Ruth Robertson A Javarious Elsayed 02/28/2021

## 2021-02-28 NOTE — Progress Notes (Signed)
PROGRESS NOTE  Ruth Robertson OIB:[id number] DOB: 17-Nov-1965 DOA: 02/26/2021 PCP: Kathyrn Drown, MD  Brief History:  56 year old female with a history of anxiety, GERD, asthma, liver lesion consistent with [place] on MRI in 2019 presenting with crampy abdominal pain, nausea, vomiting, and diarrhea of 1 day duration. Since 2017, she has had intermittent episodes of watery diarrhea, nausea, vomiting, and diffuse, sharp, crampy abdominal pain that will circle around her entire abdomen.  She has been having flares about every 6 months, but in 2021 and up until Thanksgiving of 2022, she had not been having any symptoms.  Since Thanksgiving, she had 2 additional flares in December, and now again with recurrent symptoms. Reports she had actually been in her usual state of health most of January until now.  When she feels well, she has normal bowel movements daily with 1-2 soft, formed stools without abdominal pain or nausea/vomiting.  Her stools have been green and liquidy.  No brbpr currently. CT abdomen and pelvis showed severe long segment inflammatory change involving terminal ileum consistent with follow-up inflammatory enteritis.  Given the recurrent progressive appearance compared to prior exams, consider inflammatory bowel disease.  There is no perforation or abscess.  There is bibasilar GGO.  GI was consulted to assist with management.  The patient was placed on bowel rest, opioids, and IV fluids.  Assessment/Plan: Enteritis with partial small bowel obstruction -Patient is not clinically obstructed -She is passing flatus and having bowel movements colonoscopy in 2017 with normal-appearing TI, 5 small hyperplastic polyps removed.  Capsule endoscopy in June 2018 with mild duodenitis.  Repeat colonoscopy in June 2019 and again with normal-appearing colon and terminal ileum biopsied with completely benign pathology -This is her third episode since Thanksgiving -C. difficile  negative -Stool pathogen panel negative -Appreciate GI consult and follow-up -Appreciate general surgery -Planning for colonoscopy 03/02/2021 -CRP 1.3 -01/24/2021 ESR 5 -Dec 2022 TTA, ANA, ASMA, C3, C4--neg -normal C1 inhibitor levels in June 2019 -Continue IV Solu-Medrol -1/25 AAS--gaseous distention of small bowel with nondilated colon/rectum -continue clears  Pyuria -UA 21-50 WBC -Empiric ceftriaxone pending culture data  Tobacco abuse -Tobacco cessation discussed            Family Communication:   sister updated at bedside 1/25  Consultants:  GI, general surgery  Code Status:  FULL  DVT Prophylaxis:  [place] Heparin    Procedures: As Listed in Progress Note Above  Antibiotics: None        Subjective: Patient states that abdominal pain is under better control with opioids.  She is having less bowel movements.  There is no hematochezia or melena.  She denies any nausea, vomiting, chest pain, shortness breath, cough, hemoptysis.  There is no skin rashes.  Objective: Vitals:   02/27/21 1300 02/27/21 2111 02/28/21 0511 02/28/21 1358  BP: 100/71 108/66 97/71 119/74  Pulse: 81 60 87 66  Resp: 17 17 18 16   Temp: 98.2 F (36.8 C) 98.3 F (36.8 C) 98.4 F (36.9 C) 97.9 F (36.6 C)  TempSrc:  Oral Oral Oral  SpO2: 93% 95% 95% 91%  Weight:      Height:        Intake/Output Summary (Last 24 hours) at 02/28/2021 1703 Last data filed at 02/28/2021 1600 Gross per 24 hour  Intake 2267.15 ml  Output --  Net 2267.15 ml   Weight change:  Exam:  General:  Pt is alert, follows commands appropriately, not in acute  distress HEENT: No icterus, No thrush, No neck mass, [place]/AT Cardiovascular: RRR, S1/S2, no rubs, no gallops Respiratory: CTA bilaterally, no wheezing, no crackles, no rhonchi Abdomen: Soft/+BS, diffuse tender, non distended, no guarding Extremities: No edema, No lymphangitis, No petechiae, No rashes, no synovitis   Data Reviewed: I have  personally reviewed following labs and imaging studies Basic Metabolic Panel: Recent Labs  Lab 02/26/21 2249 02/28/21 1003  NA 137 139  K 3.8 4.0  CL 108 109  CO2 23 22  GLUCOSE 128* 107*  BUN 13 17  CREATININE 0.92 0.94  CALCIUM 8.5* 8.2*  MG  --  2.0   Liver Function Tests: Recent Labs  Lab 02/26/21 2249 02/28/21 1003  AST 14* 13*  ALT 17 17  ALKPHOS 54 47  BILITOT 0.5 0.3  PROT 7.0 6.3*  ALBUMIN 3.8 3.4*   Recent Labs  Lab 02/26/21 2249  LIPASE 34   No results for input(s): AMMONIA in the last 168 hours. Coagulation Profile: No results for input(s): INR, PROTIME in the last 168 hours. CBC: Recent Labs  Lab 02/26/21 2249 02/28/21 1003  WBC 9.9 10.6*  NEUTROABS 7.7 9.1*  HGB 15.3* 13.5  HCT 48.0* 41.6  MCV 94.9 97.0  PLT 279 239   Cardiac Enzymes: No results for input(s): CKTOTAL, CKMB, CKMBINDEX, TROPONINI in the last 168 hours. BNP: Invalid input(s): POCBNP CBG: No results for input(s): GLUCAP in the last 168 hours. HbA1C: No results for input(s): HGBA1C in the last 72 hours. Urine analysis:    Component Value Date/Time   COLORURINE AMBER (A) 02/26/2021 2358   APPEARANCEUR HAZY (A) 02/26/2021 2358   LABSPEC 1.031 (H) 02/26/2021 2358   PHURINE 5.0 02/26/2021 2358   GLUCOSEU NEGATIVE 02/26/2021 2358   HGBUR MODERATE (A) 02/26/2021 2358   BILIRUBINUR NEGATIVE 02/26/2021 2358   BILIRUBINUR + 07/01/2012 1002   KETONESUR 20 (A) 02/26/2021 2358   PROTEINUR 100 (A) 02/26/2021 2358   UROBILINOGEN 0.2 10/04/2017 1204   NITRITE NEGATIVE 02/26/2021 2358   LEUKOCYTESUR NEGATIVE 02/26/2021 2358   Sepsis Labs: $[id number]'[email or web address]'[id number]$ (procalcitonin:4,lacticidven:4) ) Recent Results (from the past 240 hour(s))  Gastrointestinal Panel by PCR , Stool     Status: None   Collection Time: 02/26/21  5:41 PM   Specimen: STOOL  Result Value Ref Range Status   Campylobacter species NOT DETECTED NOT DETECTED Final   Plesimonas shigelloides NOT DETECTED NOT DETECTED Final    Salmonella species NOT DETECTED NOT DETECTED Final   Yersinia enterocolitica NOT DETECTED NOT DETECTED Final   Vibrio species NOT DETECTED NOT DETECTED Final   Vibrio cholerae NOT DETECTED NOT DETECTED Final   Enteroaggregative E coli (EAEC) NOT DETECTED NOT DETECTED Final   Enteropathogenic E coli (EPEC) NOT DETECTED NOT DETECTED Final   Enterotoxigenic E coli (ETEC) NOT DETECTED NOT DETECTED Final   Shiga like toxin producing E coli (STEC) NOT DETECTED NOT DETECTED Final   Shigella/Enteroinvasive E coli (EIEC) NOT DETECTED NOT DETECTED Final   Cryptosporidium NOT DETECTED NOT DETECTED Final   Cyclospora cayetanensis NOT DETECTED NOT DETECTED Final   Entamoeba histolytica NOT DETECTED NOT DETECTED Final   Giardia lamblia NOT DETECTED NOT DETECTED Final   Adenovirus F40/41 NOT DETECTED NOT DETECTED Final   Astrovirus NOT DETECTED NOT DETECTED Final   Norovirus GI/GII NOT DETECTED NOT DETECTED Final   Rotavirus A NOT DETECTED NOT DETECTED Final   Sapovirus (I, II, IV, and V) NOT DETECTED NOT DETECTED Final    Comment: Performed at [facility], [place]  [address] [place], [place] [address]  Resp Panel by RT-PCR (Flu A&B, Covid) Nasopharyngeal Swab     Status: None   Collection Time: 02/26/21 10:38 PM   Specimen: Nasopharyngeal Swab; Nasopharyngeal(NP) swabs in vial transport medium  Result Value Ref Range Status   SARS Coronavirus 2 by RT PCR NEGATIVE NEGATIVE Final    Comment: (NOTE) SARS-CoV-2 target nucleic acids are NOT DETECTED.  The SARS-CoV-2 RNA is generally detectable in upper respiratory specimens during the acute phase of infection. The lowest concentration of SARS-CoV-2 viral copies this assay can detect is 138 copies/mL. A negative result does not preclude SARS-Cov-2 infection and should not be used as the sole basis for treatment or other patient management decisions. A negative result may occur with  improper specimen collection/handling, submission of  specimen other than nasopharyngeal swab, presence of viral mutation(s) within the areas targeted by this assay, and inadequate number of viral copies(<138 copies/mL). A negative result must be combined with clinical observations, patient history, and epidemiological information. The expected result is Negative.  Fact Sheet for Patients:  EntrepreneurPulse.com.au  Fact Sheet for Healthcare Providers:  IncredibleEmployment.be  This test is no t yet approved or cleared by the [place] FDA and  has been authorized for detection and/or diagnosis of SARS-CoV-2 by FDA under an Emergency Use Authorization (EUA). This EUA will remain  in effect (meaning this test can be used) for the duration of the COVID-19 declaration under Section 564(b)(1) of the Act, 21 U.S.C.section 360bbb-3(b)(1), unless the authorization is terminated  or revoked sooner.       Influenza A by PCR NEGATIVE NEGATIVE Final   Influenza B by PCR NEGATIVE NEGATIVE Final    Comment: (NOTE) The Xpert Xpress SARS-CoV-2/FLU/RSV plus assay is intended as an aid in the diagnosis of influenza from Nasopharyngeal swab specimens and should not be used as a sole basis for treatment. Nasal washings and aspirates are unacceptable for Xpert Xpress SARS-CoV-2/FLU/RSV testing.  Fact Sheet for Patients: EntrepreneurPulse.com.au  Fact Sheet for Healthcare Providers: IncredibleEmployment.be  This test is not yet approved or cleared by the [place] FDA and has been authorized for detection and/or diagnosis of SARS-CoV-2 by FDA under an Emergency Use Authorization (EUA). This EUA will remain in effect (meaning this test can be used) for the duration of the COVID-19 declaration under Section 564(b)(1) of the Act, 21 U.S.C. section 360bbb-3(b)(1), unless the authorization is terminated or revoked.  Performed at [facility], [address]  [place], [place] [address]   C Difficile Quick Screen w PCR reflex     Status: None   Collection Time: 02/27/21  5:41 PM   Specimen: STOOL  Result Value Ref Range Status   C Diff antigen NEGATIVE NEGATIVE Final   C Diff toxin NEGATIVE NEGATIVE Final   C Diff interpretation No C. difficile detected.  Final    Comment: Performed at [facility], [address] [place], [place] [address]     Scheduled Meds:  heparin  5,000 Units Subcutaneous Q8H   methylPREDNISolone (SOLU-MEDROL) injection  60 mg Intravenous Daily   pantoprazole (PROTONIX) IV  40 mg Intravenous Daily   [START ON 03/01/2021] polyethylene glycol-electrolytes  4,000 mL Oral Once   Continuous Infusions:  sodium chloride 75 mL/hr at 02/28/21 1521   cefTRIAXone (ROCEPHIN)  IV Stopped (02/28/21 0610)   metronidazole 500 mg (02/28/21 1611)    Procedures/Studies: CT ABDOMEN PELVIS W CONTRAST  Result Date: 02/27/2021 CLINICAL DATA:  Generalized abdominal pain, nausea, vomiting, diarrhea EXAM: CT  ABDOMEN AND PELVIS WITH CONTRAST TECHNIQUE: Multidetector CT imaging of the abdomen and pelvis was performed using the standard protocol following bolus administration of intravenous contrast. RADIATION DOSE REDUCTION: This exam was performed according to the departmental dose-optimization program which includes automated exposure control, adjustment of the mA and/or kV according to patient size and/or use of iterative reconstruction technique. CONTRAST:  159m OMNIPAQUE IOHEXOL 300 MG/ML  SOLN COMPARISON:  12/28/2020, 06/19/2017 FINDINGS: Lower chest: Ground-glass opacity within the visualized lung bases bilaterally posteriorly may relate to multifocal infection or aspiration. No pleural effusion. Cardiac size within normal limits. No pericardial effusion. Hepatobiliary: Mild hepatic steatosis. No enhancing intrahepatic mass. No intra or extrahepatic biliary ductal dilation. Gallbladder unremarkable. Pancreas: Unremarkable Spleen: Unremarkable  Adrenals/Urinary Tract: The adrenal glands are unremarkable. The kidneys are normal. The bladder is unremarkable. Stomach/Bowel: There is marked circumferential bowel wall thickening, hyperemia, and mesenteric edema involving the terminal ileum in keeping with a infectious or inflammatory enteritis. The distal jejunum upstream from this long segment of inflamed ileum is dilated and fluid-filled in keeping with a secondary partial small bowel obstruction. Mild free fluid within the pelvis. No free intraperitoneal gas. No loculated intra-abdominal fluid collections. The findings are similar to those noted on 12/28/2020 and inflammatory changes within the ileum appear progressive when compared to remote prior examination of 06/19/2017 and 06/28/2016. The stomach, small bowel, and large bowel are otherwise unremarkable. Appendix normal. Vascular/Lymphatic: No significant vascular findings are present. No enlarged abdominal or pelvic lymph nodes. Reproductive: Status post hysterectomy. No adnexal masses. Other: No abdominal wall hernia.  Rectum unremarkable. Musculoskeletal: No acute bone abnormality. No lytic or blastic bone lesion. IMPRESSION: Severe long segment inflammatory changes involving the terminal ileum in keeping with an infectious or inflammatory enteritis. Given the recurrent and progressive appearance when compared to multiple prior examinations, inflammatory conditions such as Crohn's enterocolitis should be strongly considered. No evidence of perforation. No intra-abdominal abscess formation. Secondary partial small bowel obstruction.  Mild ascites. Bibasilar ground-glass opacity, likely infectious or inflammatory in nature. Mild hepatic steatosis. Electronically Signed   By: AFidela SalisburyM.D.   On: 02/27/2021 02:32   Acute Abdominal Series  Result Date: 02/28/2021 CLINICAL DATA:  Nausea, vomiting and diarrhea. EXAM: DG ABDOMEN ACUTE WITH 1 VIEW CHEST COMPARISON:  CT abdomen pelvis 02/27/2021 and  chest radiograph 01/19/2020. FINDINGS: Frontal view of the chest shows midline trachea. Heart size stable. Platelike density in the right perihilar region. Bibasilar streaky opacification, left greater than right. Probable small left pleural effusion. Two views of the abdomen show gaseous distention of small bowel with gas in nondilated colon and rectum. No unexpected radiopaque calculi. IMPRESSION: 1. Gaseous distention of small bowel with gas throughout nondilated colon and rectum, findings indicative of a persistent partial small bowel obstruction. 2. Right perihilar and bibasilar subsegmental atelectasis with a tiny left pleural effusion. Electronically Signed   By: MLorin PicketM.D.   On: 02/28/2021 08:26    DOrson Eva DO  [facility]  If 7PM-7AM, please contact night-coverage [email or web address] Password [email or web address] 02/28/2021, 5:03 PM   LOS: 1 day

## 2021-03-01 ENCOUNTER — Encounter (HOSPITAL_COMMUNITY)
Admission: EM | Disposition: A | Discharge status: Other Acute Inpatient Hospital | Payer: Self-pay | Source: Home / Self Care | Attending: Internal Medicine

## 2021-03-01 ENCOUNTER — Inpatient Hospital Stay (HOSPITAL_COMMUNITY): Payer: 59

## 2021-03-01 LAB — BASIC METABOLIC PANEL
Anion gap: 9 (ref 5–15)
BUN: 14 mg/dL (ref 6–20)
CO2: 22 mmol/L (ref 22–32)
Calcium: 8.2 mg/dL — ABNORMAL LOW (ref 8.9–10.3)
Chloride: 109 mmol/L (ref 98–111)
Creatinine, Ser: 0.85 mg/dL (ref 0.44–1.00)
GFR, Estimated: >60 mL/min (ref >60)
Glucose, Bld: 97 mg/dL (ref 70–99)
Potassium: 3.9 mmol/L (ref 3.5–5.1)
Sodium: 140 mmol/L (ref 135–145)

## 2021-03-01 LAB — URINE CULTURE: Culture: 10000 — AB

## 2021-03-01 LAB — D-DIMER, QUANTITATIVE: D-Dimer, Quant: 1.38 ug/mL-FEU — ABNORMAL HIGH (ref 0.00–0.50)

## 2021-03-01 LAB — MAGNESIUM: Magnesium: 2.1 mg/dL (ref 1.7–2.4)

## 2021-03-01 LAB — C1 ESTERASE INHIBITOR: C1INH SerPl-mCnc: 30 mg/dL (ref 21–39)

## 2021-03-01 SURGERY — COLONOSCOPY WITH PROPOFOL
Anesthesia: Monitor Anesthesia Care

## 2021-03-01 MED ORDER — IOHEXOL 350 MG/ML SOLN
80.0000 mL | Freq: Once | INTRAVENOUS | Status: AC | PRN
  Administered 2021-03-01: 80 mL via INTRAVENOUS

## 2021-03-01 MED ORDER — SODIUM CHLORIDE 0.9 % IV SOLN
500.0000 mg | INTRAVENOUS | Status: DC
  Administered 2021-03-01: 500 mg via INTRAVENOUS
  Filled 2021-03-01: qty 5

## 2021-03-01 NOTE — Progress Notes (Signed)
Responded to nursing call:  hemoptysis-small amount x 2   Subjective: Pt complains of some dyspnea on exertion.  No hematochezia, no cp  Vitals:   02/28/21 1358 02/28/21 2155 03/01/21 0411 03/01/21 1339  BP: 119/74 106/80 115/72 137/81  Pulse: 66 66 60 (!) 48  Resp: 16 20 18 20   Temp: 97.9 F (36.6 C) 98 F (36.7 C) 98.3 F (36.8 C) 97.9 F (36.6 C)  TempSrc: Oral Oral Oral Oral  SpO2: 91% 94% 93% 97%  Weight:      Height:       CV--RRR Lung--bibasilar rales Abd--soft+BS/lower abd tender   Assessment/Plan: Hemoptysis/Elevated D-dimer -order CTA chest  Enteritis -spoke with [facility] again at [place] accepts patient for transfer -pt is on wait list--anticipate at least 2 days -GI service, [place] notified and updated     Orson Eva, DO [facility]

## 2021-03-01 NOTE — Progress Notes (Signed)
Subjective: Started having upper abdominal pain and distension with attempting colonoscopy prep. No vomiting. Stool is liquid green with stool pieces. +flatus. +nausea. No vomiting. Multiple stools this morning. She is feeling worse after attempting prep. States attempt at drinking last night caused abdominal pain. Mother at bedside. Both are requesting transfer to [facility].   Objective: Vital signs in last 24 hours: Temp:  [97.9 F (36.6 C)-98.3 F (36.8 C)] 98.3 F (36.8 C) (01/26 0411) Pulse Rate:  [60-66] 60 (01/26 0411) Resp:  [16-20] 18 (01/26 0411) BP: (106-119)/(72-80) 115/72 (01/26 0411) SpO2:  [91 %-94 %] 93 % (01/26 0411) Last BM Date: 02/27/21 General:   Alert and oriented, acutely ill-appearing, uncomfortable Head:  Normocephalic and atraumatic. Eyes:  No icterus, sclera clear. Conjuctiva pink.  Abdomen:  Bowel sounds present, distended and round but soft. TTP epigastric  Extremities:  Without edema. Neurologic:  Alert and  oriented x4 Psych:  Alert and cooperative. Normal mood and affect.  Intake/Output from previous day: 01/25 0701 - 01/26 0700 In: 1899.2 [P.O.:480; I.V.:1139.9; IV Piggyback:279.4] Out: 850 [Urine:850] Intake/Output this shift: No intake/output data recorded.  Lab Results: Recent Labs    02/26/21 2249 02/28/21 1003  WBC 9.9 10.6*  HGB 15.3* 13.5  HCT 48.0* 41.6  PLT 279 239   BMET Recent Labs    02/26/21 2249 02/28/21 1003 03/01/21 0539  NA 137 139 140  K 3.8 4.0 3.9  CL 108 109 109  CO2 23 22 22   GLUCOSE 128* 107* 97  BUN 13 17 14   CREATININE 0.92 0.94 0.85  CALCIUM 8.5* 8.2* 8.2*   LFT Recent Labs    02/26/21 2249 02/28/21 1003  PROT 7.0 6.3*  ALBUMIN 3.8 3.4*  AST 14* 13*  ALT 17 17  ALKPHOS 54 47  BILITOT 0.5 0.3   Recent Labs    02/28/21 1003  HEPBSAG NON REACTIVE    Studies/Results: Acute Abdominal Series  Result Date: 02/28/2021 CLINICAL DATA:  Nausea, vomiting and diarrhea. EXAM: DG ABDOMEN ACUTE WITH  1 VIEW CHEST COMPARISON:  CT abdomen pelvis 02/27/2021 and chest radiograph 01/19/2020. FINDINGS: Frontal view of the chest shows midline trachea. Heart size stable. Platelike density in the right perihilar region. Bibasilar streaky opacification, left greater than right. Probable small left pleural effusion. Two views of the abdomen show gaseous distention of small bowel with gas in nondilated colon and rectum. No unexpected radiopaque calculi. IMPRESSION: 1. Gaseous distention of small bowel with gas throughout nondilated colon and rectum, findings indicative of a persistent partial small bowel obstruction. 2. Right perihilar and bibasilar subsegmental atelectasis with a tiny left pleural effusion. Electronically Signed   By: Lorin Picket M.D.   On: 02/28/2021 08:26    Assessment: 56 year old female with history of asthma, anxiety, GERD, liver lesion consistent with [place] on MRI in 2019, chronic history of recurrent abdominal pain, nausea, vomiting, diarrhea with extensive evaluation previously that has been unrevealing, who presented to the emergency room with diffuse, cramping abdominal pain, nausea/vomiting/diarrhea x1 day and admitted with enteritis involving the terminal ileum with associated partial small bowel obstruction.    Enteritis with partial small bowel obstruction: multiple episodes dating back to at least 2017 with findings of enteritis and inflammation of the TI. Prior extensive evaluation as noted in consultation, with most recent colonoscopy in 2019 at [facility] with normal-appearing colon and TI biopsies that were benign. Recurrent symptoms around Thanksgiving 2022, and now presenting with third episode since that time. Unclear etiology but remains concern for  Crohn's disease although prior evaluations negative. Additional labs pending (celiac serologies, C1 esterase inhibitor, C4 complement). GI path and Cdiff negative. Sed rate and CRP negative in December when seen with similar symptoms.    Attempt at prep for colonoscopy unsuccessful, as she noted worsening pain and abdominal distension with this. Still notes flatus, no vomiting. Numerous liquid stools with stool fragments today. Both patient and mother are requesting transfer to [place] (last seen 2019 and previously seen by Dr. Oneida Alar at [facility]).   I have spoken with Dr. Carles Collet regarding patient's request. This is in process; I suspect [facility] will not be able to accept her. Would recommend continue supportive measures, attempt clear liquid diet and advance as tolerated, with colonoscopy once able to tolerate prep.    Plan: Continue clears Abdominal xray Follow-up on additional pending serologies (eliac serologies, C1 esterase inhibitor, C4 complement) Appreciate Dr. Carles Collet initiating attempt at transfer to [facility] per patient's request Appreciate surgery following May need outpatient laparoscopy if colonoscopy negative: this would be done as outpatient per Surgery.  Annitta Needs, PhD, ANP-BC [facility]    LOS: 2 days    03/01/2021, 10:56 AM

## 2021-03-01 NOTE — Progress Notes (Signed)
Report called to [facility], [place] floor. Report given to Estrellita Ludwig, RN. Patient has been informed of the transfer. [facility] and [facility]'s transfer has been informed about the requested transfer.

## 2021-03-01 NOTE — Progress Notes (Signed)
Patient woke up to go to the bathroom for a bowel movement. Bowel movement was dark in color, solid form. Patient then started to complain of pain in her upper abdomen.

## 2021-03-01 NOTE — Progress Notes (Addendum)
PROGRESS NOTE  Sophia Sperry Pennington-Walker GBM:[id number] DOB: [birth year] DOA: 02/26/2021 PCP: Kathyrn Drown, MD   Brief History:  56 year old female with a history of anxiety, GERD, asthma, liver lesion consistent with [place] on MRI in 2019 presenting with crampy abdominal pain, nausea, vomiting, and diarrhea of 1 day duration. Since 2017, she has had intermittent episodes of watery diarrhea, nausea, vomiting, and diffuse, sharp, crampy abdominal pain that will circle around her entire abdomen.  She has been having flares about every 6 months, but in 2021 and up until Thanksgiving of 2022, she had not been having any symptoms.  Since Thanksgiving, she had 2 additional flares in December, and now again with recurrent symptoms. Reports she had actually been in her usual state of health most of January until now.  When she feels well, she has normal bowel movements daily with 1-2 soft, formed stools without abdominal pain or nausea/vomiting.  Her stools have been green and liquidy.  No brbpr currently. CT abdomen and pelvis showed severe long segment inflammatory change involving terminal ileum consistent with follow-up inflammatory enteritis.  Given the recurrent progressive appearance compared to prior exams, consider inflammatory bowel disease.  There is no perforation or abscess.  There is bibasilar GGO.  GI was consulted to assist with management.  The patient was placed on bowel rest, opioids, and IV fluids.  She was also initially started on ceftriaxone and metronidazole on 02/26/21.  Prior imaging/GI evaluation:  CT November 2022 with diffuse thickened and inflamed appearance of the small bowel consistent with enteritis. CT in May 2019 with partial or early small bowel obstruction, transition point at TI which was markedly narrowed, findings concerning for infectious versus inflammatory bowel disease. CT in May 2018 with mild circumferential wall thickening of the distal and terminal ileum  without obstruction. CT in August 2017 with thick-walled and inflamed small bowel loops throughout the abdomen with dilated mid jejunum, no transition point.  Bowel wall inflammation progresses distally and continues to TI where the cecum and appendix also appeared mildly involved.   Colonoscopy August 2017 with normal-appearing TI, five 2 to 3 mm hyperplastic polyps removed, nonbleeding internal hemorrhoids, diverticulosis in sigmoid and transverse colon. Colonoscopy June 2019 with normal-appearing colon and terminal ileum s/p biopsied, nonbleeding internal hemorrhoids.  Pathology was completely benign.   Capsule endoscopy June 2018: Mild duodenitis.   Meckel's scan August 2019: Negative.     Assessment/Plan: Enteritis with partial small bowel obstruction -Patient is not clinically obstructed -She is passing flatus and having bowel movements -colonoscopy in 2017 with normal-appearing TI, 5 small hyperplastic polyps removed.  Capsule endoscopy in June 2018 with mild duodenitis.  Repeat colonoscopy in June 2019 and again with normal-appearing colon and terminal ileum biopsied with completely benign pathology -This is her third episode since Thanksgiving -C. difficile negative -Stool pathogen panel negative -Appreciate GI consult and follow-up -Appreciate general surgery -Planning for colonoscopy 03/02/2021 -CRP 1.3 -01/24/2021 ESR 5 -Dec 2022 TTA, ANA, ASMA, C3, C4--neg -normal C1 inhibitor levels in June 2019 -Continue IV Solu-Medrol -1/25 AAS--gaseous distention of small bowel with nondilated colon/rectum -continue clears -1/26--patient could not tolerate prep for colonoscopy-->worsen abd pain;  requesting transfer to [place]; I notified radiology to powershare her CT abd/pelvis -1/26--case discussed with  GI team -1/26--I contacted [facility] and now await call back  Pyuria -UA 21-50 WBC -Empiric ceftriaxone pending culture data -urine culture--only 10 K   Tobacco abuse -Tobacco  cessation discussed  Family Communication:   mother updated at bedside 1/26   Consultants:  GI, general surgery   Code Status:  FULL   DVT Prophylaxis:  [place] Heparin      Procedures: As Listed in Progress Note Above   Antibiotics: Ceftriaxone 1/26>> Flagyl 1/26>>          Subjective: Patient could not tolerated prep for colonoscopy.  States it made her abd pain and bloating worse.  She continues to have "numerous" BMs without blood.  Denies f/c, cp, sob, n/v/  Objective: Vitals:   02/28/21 0511 02/28/21 1358 02/28/21 2155 03/01/21 0411  BP: 97/71 119/74 106/80 115/72  Pulse: 87 66 66 60  Resp: 18 16 20 18   Temp: 98.4 F (36.9 C) 97.9 F (36.6 C) 98 F (36.7 C) 98.3 F (36.8 C)  TempSrc: Oral Oral Oral Oral  SpO2: 95% 91% 94% 93%  Weight:      Height:        Intake/Output Summary (Last 24 hours) at 03/01/2021 1140 Last data filed at 03/01/2021 2458 Gross per 24 hour  Intake 1530.22 ml  Output 850 ml  Net 680.22 ml   Weight change:  Exam:  General:  Pt is alert, follows commands appropriately, not in acute distress HEENT: No icterus, No thrush, No neck mass, [place]/AT Cardiovascular: RRR, S1/S2, no rubs, no gallops Respiratory: diminished BS but CTA bilaterally, no wheezing, no crackles, no rhonchi Abdomen: Soft/+BS, diffusely tender, non distended, no guarding Extremities: No edema, No lymphangitis, No petechiae, No rashes, no synovitis   Data Reviewed: I have personally reviewed following labs and imaging studies Basic Metabolic Panel: Recent Labs  Lab 02/26/21 2249 02/28/21 1003 03/01/21 0539  NA 137 139 140  K 3.8 4.0 3.9  CL 108 109 109  CO2 23 22 22   GLUCOSE 128* 107* 97  BUN 13 17 14   CREATININE 0.92 0.94 0.85  CALCIUM 8.5* 8.2* 8.2*  MG  --  2.0 2.1   Liver Function Tests: Recent Labs  Lab 02/26/21 2249 02/28/21 1003  AST 14* 13*  ALT 17 17  ALKPHOS 54 47  BILITOT 0.5 0.3  PROT 7.0 6.3*  ALBUMIN 3.8  3.4*   Recent Labs  Lab 02/26/21 2249  LIPASE 34   No results for input(s): AMMONIA in the last 168 hours. Coagulation Profile: No results for input(s): INR, PROTIME in the last 168 hours. CBC: Recent Labs  Lab 02/26/21 2249 02/28/21 1003  WBC 9.9 10.6*  NEUTROABS 7.7 9.1*  HGB 15.3* 13.5  HCT 48.0* 41.6  MCV 94.9 97.0  PLT 279 239   Cardiac Enzymes: No results for input(s): CKTOTAL, CKMB, CKMBINDEX, TROPONINI in the last 168 hours. BNP: Invalid input(s): POCBNP CBG: No results for input(s): GLUCAP in the last 168 hours. HbA1C: No results for input(s): HGBA1C in the last 72 hours. Urine analysis:    Component Value Date/Time   COLORURINE AMBER (A) 02/26/2021 2358   APPEARANCEUR HAZY (A) 02/26/2021 2358   LABSPEC 1.031 (H) 02/26/2021 2358   PHURINE 5.0 02/26/2021 2358   GLUCOSEU NEGATIVE 02/26/2021 2358   HGBUR MODERATE (A) 02/26/2021 2358   BILIRUBINUR NEGATIVE 02/26/2021 2358   BILIRUBINUR + 07/01/2012 1002   KETONESUR 20 (A) 02/26/2021 2358   PROTEINUR 100 (A) 02/26/2021 2358   UROBILINOGEN 0.2 10/04/2017 1204   NITRITE NEGATIVE 02/26/2021 2358   LEUKOCYTESUR NEGATIVE 02/26/2021 2358   Sepsis Labs: @LABRCNTIP (procalcitonin:4,lacticidven:4) ) Recent Results (from the past 240 hour(s))  Gastrointestinal Panel by PCR , Stool  Status: None   Collection Time: 02/26/21  5:41 PM   Specimen: STOOL  Result Value Ref Range Status   Campylobacter species NOT DETECTED NOT DETECTED Final   Plesimonas shigelloides NOT DETECTED NOT DETECTED Final   Salmonella species NOT DETECTED NOT DETECTED Final   Yersinia enterocolitica NOT DETECTED NOT DETECTED Final   Vibrio species NOT DETECTED NOT DETECTED Final   Vibrio cholerae NOT DETECTED NOT DETECTED Final   Enteroaggregative E coli (EAEC) NOT DETECTED NOT DETECTED Final   Enteropathogenic E coli (EPEC) NOT DETECTED NOT DETECTED Final   Enterotoxigenic E coli (ETEC) NOT DETECTED NOT DETECTED Final   Shiga like toxin  producing E coli (STEC) NOT DETECTED NOT DETECTED Final   Shigella/Enteroinvasive E coli (EIEC) NOT DETECTED NOT DETECTED Final   Cryptosporidium NOT DETECTED NOT DETECTED Final   Cyclospora cayetanensis NOT DETECTED NOT DETECTED Final   Entamoeba histolytica NOT DETECTED NOT DETECTED Final   Giardia lamblia NOT DETECTED NOT DETECTED Final   Adenovirus F40/41 NOT DETECTED NOT DETECTED Final   Astrovirus NOT DETECTED NOT DETECTED Final   Norovirus GI/GII NOT DETECTED NOT DETECTED Final   Rotavirus A NOT DETECTED NOT DETECTED Final   Sapovirus (I, II, IV, and V) NOT DETECTED NOT DETECTED Final    Comment: Performed at [facility], [address] [place], [place] [address]  Resp Panel by RT-PCR (Flu A&B, Covid) Nasopharyngeal Swab     Status: None   Collection Time: 02/26/21 10:38 PM   Specimen: Nasopharyngeal Swab; Nasopharyngeal(NP) swabs in vial transport medium  Result Value Ref Range Status   SARS Coronavirus 2 by RT PCR NEGATIVE NEGATIVE Final    Comment: (NOTE) SARS-CoV-2 target nucleic acids are NOT DETECTED.  The SARS-CoV-2 RNA is generally detectable in upper respiratory specimens during the acute phase of infection. The lowest concentration of SARS-CoV-2 viral copies this assay can detect is 138 copies/mL. A negative result does not preclude SARS-Cov-2 infection and should not be used as the sole basis for treatment or other patient management decisions. A negative result may occur with  improper specimen collection/handling, submission of specimen other than nasopharyngeal swab, presence of viral mutation(s) within the areas targeted by this assay, and inadequate number of viral copies(<138 copies/mL). A negative result must be combined with clinical observations, patient history, and epidemiological information. The expected result is Negative.  Fact Sheet for Patients:  EntrepreneurPulse.com.au  Fact Sheet for Healthcare Providers:   IncredibleEmployment.be  This test is no t yet approved or cleared by the [place] FDA and  has been authorized for detection and/or diagnosis of SARS-CoV-2 by FDA under an Emergency Use Authorization (EUA). This EUA will remain  in effect (meaning this test can be used) for the duration of the COVID-19 declaration under Section 564(b)(1) of the Act, 21 U.S.C.section 360bbb-3(b)(1), unless the authorization is terminated  or revoked sooner.       Influenza A by PCR NEGATIVE NEGATIVE Final   Influenza B by PCR NEGATIVE NEGATIVE Final    Comment: (NOTE) The Xpert Xpress SARS-CoV-2/FLU/RSV plus assay is intended as an aid in the diagnosis of influenza from Nasopharyngeal swab specimens and should not be used as a sole basis for treatment. Nasal washings and aspirates are unacceptable for Xpert Xpress SARS-CoV-2/FLU/RSV testing.  Fact Sheet for Patients: EntrepreneurPulse.com.au  Fact Sheet for Healthcare Providers: IncredibleEmployment.be  This test is not yet approved or cleared by the [place] FDA and has been authorized for detection and/or diagnosis of SARS-CoV-2 by FDA  under an Emergency Use Authorization (EUA). This EUA will remain in effect (meaning this test can be used) for the duration of the COVID-19 declaration under Section 564(b)(1) of the Act, 21 U.S.C. section 360bbb-3(b)(1), unless the authorization is terminated or revoked.  Performed at [facility], [address], [place], [place] [address]   Urine Culture     Status: Abnormal   Collection Time: 02/26/21 11:58 PM   Specimen: Urine, Clean Catch  Result Value Ref Range Status   Specimen Description   Final    URINE, CLEAN CATCH Performed at [facility], [address] [place], [place] [address]    Special Requests   Final    NONE Performed at [facility], [address], [place], [place] [address]    Culture (A)  Final    10,000  COLONIES/mL MULTIPLE SPECIES PRESENT, SUGGEST RECOLLECTION   Report Status 03/01/2021 FINAL  Final  C Difficile Quick Screen w PCR reflex     Status: None   Collection Time: 02/27/21  5:41 PM   Specimen: STOOL  Result Value Ref Range Status   C Diff antigen NEGATIVE NEGATIVE Final   C Diff toxin NEGATIVE NEGATIVE Final   C Diff interpretation No C. difficile detected.  Final    Comment: Performed at [facility], [address] [place], [place] [address]     Scheduled Meds:  heparin  5,000 Units Subcutaneous Q8H   methylPREDNISolone (SOLU-MEDROL) injection  60 mg Intravenous Daily   pantoprazole (PROTONIX) IV  40 mg Intravenous Daily   Continuous Infusions:  sodium chloride 75 mL/hr at 03/01/21 0739   cefTRIAXone (ROCEPHIN)  IV 1 g (03/01/21 0559)   metronidazole 500 mg (03/01/21 0351)    Procedures/Studies: CT ABDOMEN PELVIS W CONTRAST  Result Date: 02/27/2021 CLINICAL DATA:  Generalized abdominal pain, nausea, vomiting, diarrhea EXAM: CT ABDOMEN AND PELVIS WITH CONTRAST TECHNIQUE: Multidetector CT imaging of the abdomen and pelvis was performed using the standard protocol following bolus administration of intravenous contrast. RADIATION DOSE REDUCTION: This exam was performed according to the departmental dose-optimization program which includes automated exposure control, adjustment of the mA and/or kV according to patient size and/or use of iterative reconstruction technique. CONTRAST:  180m OMNIPAQUE IOHEXOL 300 MG/ML  SOLN COMPARISON:  12/28/2020, 06/19/2017 FINDINGS: Lower chest: Ground-glass opacity within the visualized lung bases bilaterally posteriorly may relate to multifocal infection or aspiration. No pleural effusion. Cardiac size within normal limits. No pericardial effusion. Hepatobiliary: Mild hepatic steatosis. No enhancing intrahepatic mass. No intra or extrahepatic biliary ductal dilation. Gallbladder unremarkable. Pancreas: Unremarkable Spleen: Unremarkable  Adrenals/Urinary Tract: The adrenal glands are unremarkable. The kidneys are normal. The bladder is unremarkable. Stomach/Bowel: There is marked circumferential bowel wall thickening, hyperemia, and mesenteric edema involving the terminal ileum in keeping with a infectious or inflammatory enteritis. The distal jejunum upstream from this long segment of inflamed ileum is dilated and fluid-filled in keeping with a secondary partial small bowel obstruction. Mild free fluid within the pelvis. No free intraperitoneal gas. No loculated intra-abdominal fluid collections. The findings are similar to those noted on 12/28/2020 and inflammatory changes within the ileum appear progressive when compared to remote prior examination of 06/19/2017 and 06/28/2016. The stomach, small bowel, and large bowel are otherwise unremarkable. Appendix normal. Vascular/Lymphatic: No significant vascular findings are present. No enlarged abdominal or pelvic lymph nodes. Reproductive: Status post hysterectomy. No adnexal masses. Other: No abdominal wall hernia.  Rectum unremarkable. Musculoskeletal: No acute bone abnormality. No lytic or blastic bone lesion. IMPRESSION: Severe  long segment inflammatory changes involving the terminal ileum in keeping with an infectious or inflammatory enteritis. Given the recurrent and progressive appearance when compared to multiple prior examinations, inflammatory conditions such as Crohn's enterocolitis should be strongly considered. No evidence of perforation. No intra-abdominal abscess formation. Secondary partial small bowel obstruction.  Mild ascites. Bibasilar ground-glass opacity, likely infectious or inflammatory in nature. Mild hepatic steatosis. Electronically Signed   By: Fidela Salisbury M.D.   On: 02/27/2021 02:32   Acute Abdominal Series  Result Date: 02/28/2021 CLINICAL DATA:  Nausea, vomiting and diarrhea. EXAM: DG ABDOMEN ACUTE WITH 1 VIEW CHEST COMPARISON:  CT abdomen pelvis 02/27/2021 and  chest radiograph 01/19/2020. FINDINGS: Frontal view of the chest shows midline trachea. Heart size stable. Platelike density in the right perihilar region. Bibasilar streaky opacification, left greater than right. Probable small left pleural effusion. Two views of the abdomen show gaseous distention of small bowel with gas in nondilated colon and rectum. No unexpected radiopaque calculi. IMPRESSION: 1. Gaseous distention of small bowel with gas throughout nondilated colon and rectum, findings indicative of a persistent partial small bowel obstruction. 2. Right perihilar and bibasilar subsegmental atelectasis with a tiny left pleural effusion. Electronically Signed   By: Lorin Picket M.D.   On: 02/28/2021 08:26    Orson Eva, DO  [facility]  If 7PM-7AM, please contact night-coverage [email or web address] Password [email or web address] 03/01/2021, 11:40 AM   LOS: 2 days

## 2021-03-01 NOTE — Progress Notes (Signed)
[facility] Progress Note     Subjective: Patient seen and examined.  She is resting in bed currently.  She complains of worsening abdominal pain, as she tried to drink some of her colonoscopy prep.  She does confirm a little bit of nausea, but denies any episodes of emesis.  She continues to pass flatus and have bowel movements.  Patient's family is at bedside, and expressed their frustrations with the inability to determine what is the source of her problems  Objective: Vital signs in last 24 hours: Temp:  [97.9 F (36.6 C)-98.3 F (36.8 C)] 98.3 F (36.8 C) (01/26 0411) Pulse Rate:  [60-66] 60 (01/26 0411) Resp:  [16-20] 18 (01/26 0411) BP: (106-119)/(72-80) 115/72 (01/26 0411) SpO2:  [91 %-94 %] 93 % (01/26 0411) Last BM Date: 02/27/21  Intake/Output from previous day: 01/25 0701 - 01/26 0700 In: 1899.2 [P.O.:480; I.V.:1139.9; IV Piggyback:279.4] Out: 850 [Urine:850] Intake/Output this shift: No intake/output data recorded.  General appearance: alert, cooperative, and mild distress GI: Soft, mild distention, no percussion tenderness, diffuse tenderness to palpation, worse in the lower quadrants, no rigidity, guarding, or rebound tenderness  Lab Results:  Recent Labs    02/26/21 2249 02/28/21 1003  WBC 9.9 10.6*  HGB 15.3* 13.5  HCT 48.0* 41.6  PLT 279 239   BMET Recent Labs    02/28/21 1003 03/01/21 0539  NA 139 140  K 4.0 3.9  CL 109 109  CO2 22 22  GLUCOSE 107* 97  BUN 17 14  CREATININE 0.94 0.85  CALCIUM 8.2* 8.2*   PT/INR No results for input(s): LABPROT, INR in the last 72 hours.  Studies/Results: Acute Abdominal Series  Result Date: 02/28/2021 CLINICAL DATA:  Nausea, vomiting and diarrhea. EXAM: DG ABDOMEN ACUTE WITH 1 VIEW CHEST COMPARISON:  CT abdomen pelvis 02/27/2021 and chest radiograph 01/19/2020. FINDINGS: Frontal view of the chest shows midline trachea. Heart size stable. Platelike density in the right perihilar region.  Bibasilar streaky opacification, left greater than right. Probable small left pleural effusion. Two views of the abdomen show gaseous distention of small bowel with gas in nondilated colon and rectum. No unexpected radiopaque calculi. IMPRESSION: 1. Gaseous distention of small bowel with gas throughout nondilated colon and rectum, findings indicative of a persistent partial small bowel obstruction. 2. Right perihilar and bibasilar subsegmental atelectasis with a tiny left pleural effusion. Electronically Signed   By: Lorin Picket M.D.   On: 02/28/2021 08:26    Anti-infectives: Anti-infectives (From admission, onward)    Start     Dose/Rate Route Frequency Ordered Stop   02/27/21 0415  cefTRIAXone (ROCEPHIN) 1 g in sodium chloride 0.9 % 100 mL IVPB        1 g 200 mL/hr over 30 Minutes Intravenous Every 24 hours 02/27/21 0402     02/27/21 0415  metroNIDAZOLE (FLAGYL) IVPB 500 mg        500 mg 100 mL/hr over 60 Minutes Intravenous Every 12 hours 02/27/21 0402         Assessment/Plan: Ruth Robertson is a 56 year old female who was admitted with enteritis of the terminal ileum with resulting partial small bowel obstruction  -Patient continues to have abdominal pain, which was worsened by attempting to drink her colonoscopy prep -Patient with likely ileus secondary to terminal ileum inflammation -Will defer to GI regarding diet and plans for colonoscopy given patient's inability to drink enough prep to be having clear bowel movements -No acute surgical intervention at this time -Appreciate GI recs -Care  per primary team   LOS: 2 days    Angeles Paolucci A Trevious Rampey 03/01/2021

## 2021-03-02 DIAGNOSIS — J45909 Unspecified asthma, uncomplicated: Secondary | ICD-10-CM | POA: Diagnosis not present

## 2021-03-02 DIAGNOSIS — K2960 Other gastritis without bleeding: Secondary | ICD-10-CM | POA: Diagnosis not present

## 2021-03-02 DIAGNOSIS — F419 Anxiety disorder, unspecified: Secondary | ICD-10-CM | POA: Diagnosis not present

## 2021-03-02 DIAGNOSIS — K635 Polyp of colon: Secondary | ICD-10-CM | POA: Diagnosis not present

## 2021-03-02 DIAGNOSIS — Z20822 Contact with and (suspected) exposure to covid-19: Secondary | ICD-10-CM | POA: Diagnosis not present

## 2021-03-02 DIAGNOSIS — R109 Unspecified abdominal pain: Secondary | ICD-10-CM | POA: Diagnosis not present

## 2021-03-02 DIAGNOSIS — K219 Gastro-esophageal reflux disease without esophagitis: Secondary | ICD-10-CM | POA: Diagnosis not present

## 2021-03-02 DIAGNOSIS — F17210 Nicotine dependence, cigarettes, uncomplicated: Secondary | ICD-10-CM | POA: Diagnosis not present

## 2021-03-02 DIAGNOSIS — K5000 Crohn's disease of small intestine without complications: Secondary | ICD-10-CM | POA: Diagnosis not present

## 2021-03-02 DIAGNOSIS — R0789 Other chest pain: Secondary | ICD-10-CM | POA: Diagnosis not present

## 2021-03-02 DIAGNOSIS — R001 Bradycardia, unspecified: Secondary | ICD-10-CM | POA: Diagnosis not present

## 2021-03-02 DIAGNOSIS — R1084 Generalized abdominal pain: Secondary | ICD-10-CM | POA: Diagnosis not present

## 2021-03-02 DIAGNOSIS — K259 Gastric ulcer, unspecified as acute or chronic, without hemorrhage or perforation: Secondary | ICD-10-CM | POA: Diagnosis not present

## 2021-03-02 DIAGNOSIS — K829 Disease of gallbladder, unspecified: Secondary | ICD-10-CM | POA: Diagnosis not present

## 2021-03-02 DIAGNOSIS — K449 Diaphragmatic hernia without obstruction or gangrene: Secondary | ICD-10-CM | POA: Diagnosis not present

## 2021-03-02 LAB — GLIADIN ANTIBODIES, SERUM
Antigliadin Abs, IgA: 5 units (ref 0–19)
Gliadin IgG: 3 units (ref 0–19)

## 2021-03-02 LAB — QUANTIFERON-TB GOLD PLUS

## 2021-03-02 LAB — C4 COMPLEMENT: Complement C4, Body Fluid: 23 mg/dL (ref 12–38)

## 2021-03-02 NOTE — Progress Notes (Signed)
[facility] here to take patient to [facility]. Patient ambulatory to their stretcher. Their staff was handed her computer bag, her purse and another bag with her clothes and shoes. Patient in no distress at this time.

## 2021-03-02 NOTE — Discharge Summary (Addendum)
Physician Discharge Summary  Ruth Robertson LOV:[id number] DOB: 1965/12/09 DOA: 02/26/2021  PCP: Kathyrn Drown, MD  Admit date: 02/26/2021 Discharge date: 03/02/2021  Admitted From: Home Disposition:  [place]  Recommendations for Outpatient Follow-up:  Follow up with PCP in 1-2 weeks Please obtain BMP/CBC in one week     Discharge Condition: Stable CODE STATUS:FULL Diet recommendation: clears   Brief/Interim Summary: 56 year old female with a history of anxiety, GERD, asthma, liver lesion consistent with [place] on MRI in 2019 presenting with crampy abdominal pain, nausea, vomiting, and diarrhea of 1 day duration. Since 2017, she has had intermittent episodes of watery diarrhea, nausea, vomiting, and diffuse, sharp, crampy abdominal pain that will circle around her entire abdomen.  She has been having flares about every 6 months, but in 2021 and up until Thanksgiving of 2022, she had not been having any symptoms.  Since Thanksgiving, she had 2 additional flares in December, and now again with recurrent symptoms. Reports she had actually been in her usual state of health most of January until now.  When she feels well, she has normal bowel movements daily with 1-2 soft, formed stools without abdominal pain or nausea/vomiting.  Her stools have been green and liquidy.  No brbpr currently. CT abdomen and pelvis showed severe long segment inflammatory change involving terminal ileum consistent with follow-up inflammatory enteritis.  Given the recurrent progressive appearance compared to prior exams, consider inflammatory bowel disease.  There is no perforation or abscess.  There is bibasilar GGO.  GI was consulted to assist with management.  The patient was placed on bowel rest, opioids, and IV fluids.  She was also initially started on ceftriaxone and metronidazole on 02/26/21.   Prior imaging/GI evaluation:  CT November 2022 with diffuse thickened and inflamed appearance of the  small bowel consistent with enteritis. CT in May 2019 with partial or early small bowel obstruction, transition point at TI which was markedly narrowed, findings concerning for infectious versus inflammatory bowel disease. CT in May 2018 with mild circumferential wall thickening of the distal and terminal ileum without obstruction. CT in August 2017 with thick-walled and inflamed small bowel loops throughout the abdomen with dilated mid jejunum, no transition point.  Bowel wall inflammation progresses distally and continues to TI where the cecum and appendix also appeared mildly involved.   Colonoscopy August 2017 with normal-appearing TI, five 2 to 3 mm hyperplastic polyps removed, nonbleeding internal hemorrhoids, diverticulosis in sigmoid and transverse colon. Colonoscopy June 2019 with normal-appearing colon and terminal ileum s/p biopsied, nonbleeding internal hemorrhoids.  Pathology was completely benign.   Capsule endoscopy June 2018: Mild duodenitis.   Meckel's scan August 2019: Negative.  Discharge Diagnoses:  Enteritis with partial small bowel obstruction -Patient is not clinically obstructed -She is passing flatus and having bowel movements -colonoscopy in 2017 with normal-appearing TI, 5 small hyperplastic polyps removed.  Capsule endoscopy in June 2018 with mild duodenitis.  Repeat colonoscopy in June 2019 and again with normal-appearing colon and terminal ileum biopsied with completely benign pathology -This is her third episode since Thanksgiving -C. difficile negative -Stool pathogen panel negative -Appreciate GI consult and follow-up -Appreciate general surgery -Planning for colonoscopy 03/02/2021 -CRP 1.3 -01/24/2021 ESR 5 -Dec 2022 TTA, ANA, ASMA, C3, C4--neg -normal C1 inhibitor levels in June 2019 -Continue IV Solu-Medrol -1/25 AAS--gaseous distention of small bowel with nondilated colon/rectum -continue clears -1/26--patient could not tolerate prep for  colonoscopy-->worsen abd pain;  requesting transfer to [place]; I notified radiology to powershare her CT  abd/pelvis -1/26--case discussed with  GI team; patient wanted transfer to [place] -1/26--I contacted [place] spoke with their physicians and they accepted patient in transfer    Pyuria -UA 21-50 WBC -Empiric ceftriaxone pending culture data -urine culture--only 10 K   Tobacco abuse -Tobacco cessation discussed  Hemoptysis -likely due to PNA -CTA chest neg for PE -stable on RA  Lobar Pneumonia -CTA chest--Patchy bilateral lower lobe pneumonia, worse on the right than the left, with volume loss/consolidation of the medial right lower lobe -pt already on ceftriaxone and metronidazole   Discharge Instructions   Allergies as of 03/02/2021   No Known Allergies      Medication List     STOP taking these medications    dicyclomine 10 MG capsule Commonly known as: BENTYL   loperamide 2 MG capsule Commonly known as: IMODIUM   promethazine 25 MG tablet Commonly known as: PHENERGAN       TAKE these medications    HYDROcodone-acetaminophen 5-325 MG tablet Commonly known as: Norco Take 1 tablet by mouth every 4 (four) hours as needed for moderate pain.   predniSONE 50 MG tablet Commonly known as: DELTASONE Take 1 tablet (50 mg total) by mouth daily.       ASK your doctor about these medications    acetaminophen 325 MG tablet Commonly known as: TYLENOL Take 2 tablets (650 mg total) by mouth every 6 (six) hours as needed for mild pain (or Fever >/= 101).   ondansetron 8 MG tablet Commonly known as: Zofran Take 1 tablet (8 mg total) by mouth every 8 (eight) hours as needed for nausea.        Follow-up Information     Kathyrn Drown, MD.   Specialty: Family Medicine Contact information: [place] [address] [phone]         [facility].   Specialty: Gastroenterology Contact information: [place] [phone]               No Known Allergies  Consultations: GI   Procedures/Studies: CT Angio Chest Pulmonary Embolism (PE) W or WO Contrast  Result Date: 03/01/2021 CLINICAL DATA:  Pulmonary embolism suspected. Positive D-dimer. Hemoptysis. Cough. Shortness of breath and abdominal swelling. EXAM: CT ANGIOGRAPHY CHEST WITH CONTRAST TECHNIQUE: Multidetector CT imaging of the chest was performed using the standard protocol during bolus administration of intravenous contrast. Multiplanar CT image reconstructions and MIPs were obtained to evaluate the vascular anatomy. RADIATION DOSE REDUCTION: This exam was performed according to the departmental dose-optimization program which includes automated exposure control, adjustment of the mA and/or kV according to patient size and/or use of iterative reconstruction technique. CONTRAST:  60m OMNIPAQUE IOHEXOL 350 MG/ML SOLN COMPARISON:  Chest radiography 01/19/2020 FINDINGS: Cardiovascular: Heart size is normal. No pericardial effusion. No coronary artery calcification or aortic atherosclerotic calcification. Pulmonary arterial opacification is excellent. There are no pulmonary emboli. Mediastinum/Nodes: No mediastinal or hilar mass or lymphadenopathy. Lungs/Pleura: There are patchy bilateral lower lobe infiltrates, worse on the right than the left, with consolidation and volume loss of the medial right lower lobe. Upper lungs are clear. No effusion. Upper Abdomen: Normal Musculoskeletal: Normal Review of the MIP images confirms the above findings. IMPRESSION: No pulmonary emboli. Patchy bilateral lower lobe pneumonia, worse on the right than the left, with volume loss/consolidation of the medial right lower lobe. Electronically Signed   By: MNelson ChimesM.D.   On: 03/01/2021 15:32   CT ABDOMEN PELVIS W  CONTRAST  Result Date: 02/27/2021 CLINICAL DATA:  Generalized abdominal pain, nausea, vomiting, diarrhea  EXAM: CT ABDOMEN AND PELVIS WITH CONTRAST TECHNIQUE: Multidetector CT imaging of the abdomen and pelvis was performed using the standard protocol following bolus administration of intravenous contrast. RADIATION DOSE REDUCTION: This exam was performed according to the departmental dose-optimization program which includes automated exposure control, adjustment of the mA and/or kV according to patient size and/or use of iterative reconstruction technique. CONTRAST:  175m OMNIPAQUE IOHEXOL 300 MG/ML  SOLN COMPARISON:  12/28/2020, 06/19/2017 FINDINGS: Lower chest: Ground-glass opacity within the visualized lung bases bilaterally posteriorly may relate to multifocal infection or aspiration. No pleural effusion. Cardiac size within normal limits. No pericardial effusion. Hepatobiliary: Mild hepatic steatosis. No enhancing intrahepatic mass. No intra or extrahepatic biliary ductal dilation. Gallbladder unremarkable. Pancreas: Unremarkable Spleen: Unremarkable Adrenals/Urinary Tract: The adrenal glands are unremarkable. The kidneys are normal. The bladder is unremarkable. Stomach/Bowel: There is marked circumferential bowel wall thickening, hyperemia, and mesenteric edema involving the terminal ileum in keeping with a infectious or inflammatory enteritis. The distal jejunum upstream from this long segment of inflamed ileum is dilated and fluid-filled in keeping with a secondary partial small bowel obstruction. Mild free fluid within the pelvis. No free intraperitoneal gas. No loculated intra-abdominal fluid collections. The findings are similar to those noted on 12/28/2020 and inflammatory changes within the ileum appear progressive when compared to remote prior examination of 06/19/2017 and 06/28/2016. The stomach, small bowel, and large bowel are otherwise unremarkable. Appendix normal. Vascular/Lymphatic: No significant vascular findings are present. No enlarged abdominal or pelvic lymph nodes. Reproductive: Status post  hysterectomy. No adnexal masses. Other: No abdominal wall hernia.  Rectum unremarkable. Musculoskeletal: No acute bone abnormality. No lytic or blastic bone lesion. IMPRESSION: Severe long segment inflammatory changes involving the terminal ileum in keeping with an infectious or inflammatory enteritis. Given the recurrent and progressive appearance when compared to multiple prior examinations, inflammatory conditions such as Crohn's enterocolitis should be strongly considered. No evidence of perforation. No intra-abdominal abscess formation. Secondary partial small bowel obstruction.  Mild ascites. Bibasilar ground-glass opacity, likely infectious or inflammatory in nature. Mild hepatic steatosis. Electronically Signed   By: AFidela SalisburyM.D.   On: 02/27/2021 02:32   Acute Abdominal Series  Result Date: 02/28/2021 CLINICAL DATA:  Nausea, vomiting and diarrhea. EXAM: DG ABDOMEN ACUTE WITH 1 VIEW CHEST COMPARISON:  CT abdomen pelvis 02/27/2021 and chest radiograph 01/19/2020. FINDINGS: Frontal view of the chest shows midline trachea. Heart size stable. Platelike density in the right perihilar region. Bibasilar streaky opacification, left greater than right. Probable small left pleural effusion. Two views of the abdomen show gaseous distention of small bowel with gas in nondilated colon and rectum. No unexpected radiopaque calculi. IMPRESSION: 1. Gaseous distention of small bowel with gas throughout nondilated colon and rectum, findings indicative of a persistent partial small bowel obstruction. 2. Right perihilar and bibasilar subsegmental atelectasis with a tiny left pleural effusion. Electronically Signed   By: MLorin PicketM.D.   On: 02/28/2021 08:26   DG Abd Portable 2V  Result Date: 03/01/2021 CLINICAL DATA:  Abdominal pain and distention EXAM: PORTABLE ABDOMEN - 2 VIEW COMPARISON:  02/28/2021 FINDINGS: There is decrease in number of air-filled small bowel loops. There are few residual dilated small  bowel loops measuring up to 4.3 cm in diameter. Stomach is not distended. Colon is not distended. IMPRESSION: There is interval decrease in number of dilated small bowel loops, possibly suggesting resolving partial small bowel obstruction. Electronically Signed  By: Elmer Picker M.D.   On: 03/01/2021 14:42        Discharge Exam: Vitals:   03/01/21 2318 03/02/21 0010  BP: (!) 144/81 (!) 144/81  Pulse: 62 62  Resp: 18 18  Temp: 98.8 F (37.1 C) 98.8 F (37.1 C)  SpO2: 95% 95%   Vitals:   03/01/21 1339 03/01/21 2154 03/01/21 2318 03/02/21 0010  BP: 137/81 (!) 144/81 (!) 144/81 (!) 144/81  Pulse: (!) 48 62 62 62  Resp: 20 18 18 18   Temp: 97.9 F (36.6 C) 98.8 F (37.1 C) 98.8 F (37.1 C) 98.8 F (37.1 C)  TempSrc: Oral     SpO2: 97% 95% 95% 95%  Weight:      Height:        General: Pt is alert, awake, not in acute distress Cardiovascular: RRR, S1/S2 +, no rubs, no gallops Respiratory: bibasilar rales . No wheeze Abdominal: Soft, NT, ND, bowel sounds + Extremities: no edema, no cyanosis   The results of significant diagnostics from this hospitalization (including imaging, microbiology, ancillary and laboratory) are listed below for reference.    Significant Diagnostic Studies: CT Angio Chest Pulmonary Embolism (PE) W or WO Contrast  Result Date: 03/01/2021 CLINICAL DATA:  Pulmonary embolism suspected. Positive D-dimer. Hemoptysis. Cough. Shortness of breath and abdominal swelling. EXAM: CT ANGIOGRAPHY CHEST WITH CONTRAST TECHNIQUE: Multidetector CT imaging of the chest was performed using the standard protocol during bolus administration of intravenous contrast. Multiplanar CT image reconstructions and MIPs were obtained to evaluate the vascular anatomy. RADIATION DOSE REDUCTION: This exam was performed according to the departmental dose-optimization program which includes automated exposure control, adjustment of the mA and/or kV according to patient size and/or use  of iterative reconstruction technique. CONTRAST:  50m OMNIPAQUE IOHEXOL 350 MG/ML SOLN COMPARISON:  Chest radiography 01/19/2020 FINDINGS: Cardiovascular: Heart size is normal. No pericardial effusion. No coronary artery calcification or aortic atherosclerotic calcification. Pulmonary arterial opacification is excellent. There are no pulmonary emboli. Mediastinum/Nodes: No mediastinal or hilar mass or lymphadenopathy. Lungs/Pleura: There are patchy bilateral lower lobe infiltrates, worse on the right than the left, with consolidation and volume loss of the medial right lower lobe. Upper lungs are clear. No effusion. Upper Abdomen: Normal Musculoskeletal: Normal Review of the MIP images confirms the above findings. IMPRESSION: No pulmonary emboli. Patchy bilateral lower lobe pneumonia, worse on the right than the left, with volume loss/consolidation of the medial right lower lobe. Electronically Signed   By: MNelson ChimesM.D.   On: 03/01/2021 15:32   CT ABDOMEN PELVIS W CONTRAST  Result Date: 02/27/2021 CLINICAL DATA:  Generalized abdominal pain, nausea, vomiting, diarrhea EXAM: CT ABDOMEN AND PELVIS WITH CONTRAST TECHNIQUE: Multidetector CT imaging of the abdomen and pelvis was performed using the standard protocol following bolus administration of intravenous contrast. RADIATION DOSE REDUCTION: This exam was performed according to the departmental dose-optimization program which includes automated exposure control, adjustment of the mA and/or kV according to patient size and/or use of iterative reconstruction technique. CONTRAST:  1047mOMNIPAQUE IOHEXOL 300 MG/ML  SOLN COMPARISON:  12/28/2020, 06/19/2017 FINDINGS: Lower chest: Ground-glass opacity within the visualized lung bases bilaterally posteriorly may relate to multifocal infection or aspiration. No pleural effusion. Cardiac size within normal limits. No pericardial effusion. Hepatobiliary: Mild hepatic steatosis. No enhancing intrahepatic mass. No  intra or extrahepatic biliary ductal dilation. Gallbladder unremarkable. Pancreas: Unremarkable Spleen: Unremarkable Adrenals/Urinary Tract: The adrenal glands are unremarkable. The kidneys are normal. The bladder is unremarkable. Stomach/Bowel: There is marked circumferential bowel  wall thickening, hyperemia, and mesenteric edema involving the terminal ileum in keeping with a infectious or inflammatory enteritis. The distal jejunum upstream from this long segment of inflamed ileum is dilated and fluid-filled in keeping with a secondary partial small bowel obstruction. Mild free fluid within the pelvis. No free intraperitoneal gas. No loculated intra-abdominal fluid collections. The findings are similar to those noted on 12/28/2020 and inflammatory changes within the ileum appear progressive when compared to remote prior examination of 06/19/2017 and 06/28/2016. The stomach, small bowel, and large bowel are otherwise unremarkable. Appendix normal. Vascular/Lymphatic: No significant vascular findings are present. No enlarged abdominal or pelvic lymph nodes. Reproductive: Status post hysterectomy. No adnexal masses. Other: No abdominal wall hernia.  Rectum unremarkable. Musculoskeletal: No acute bone abnormality. No lytic or blastic bone lesion. IMPRESSION: Severe long segment inflammatory changes involving the terminal ileum in keeping with an infectious or inflammatory enteritis. Given the recurrent and progressive appearance when compared to multiple prior examinations, inflammatory conditions such as Crohn's enterocolitis should be strongly considered. No evidence of perforation. No intra-abdominal abscess formation. Secondary partial small bowel obstruction.  Mild ascites. Bibasilar ground-glass opacity, likely infectious or inflammatory in nature. Mild hepatic steatosis. Electronically Signed   By: Fidela Salisbury M.D.   On: 02/27/2021 02:32   Acute Abdominal Series  Result Date: 02/28/2021 CLINICAL DATA:   Nausea, vomiting and diarrhea. EXAM: DG ABDOMEN ACUTE WITH 1 VIEW CHEST COMPARISON:  CT abdomen pelvis 02/27/2021 and chest radiograph 01/19/2020. FINDINGS: Frontal view of the chest shows midline trachea. Heart size stable. Platelike density in the right perihilar region. Bibasilar streaky opacification, left greater than right. Probable small left pleural effusion. Two views of the abdomen show gaseous distention of small bowel with gas in nondilated colon and rectum. No unexpected radiopaque calculi. IMPRESSION: 1. Gaseous distention of small bowel with gas throughout nondilated colon and rectum, findings indicative of a persistent partial small bowel obstruction. 2. Right perihilar and bibasilar subsegmental atelectasis with a tiny left pleural effusion. Electronically Signed   By: Lorin Picket M.D.   On: 02/28/2021 08:26   DG Abd Portable 2V  Result Date: 03/01/2021 CLINICAL DATA:  Abdominal pain and distention EXAM: PORTABLE ABDOMEN - 2 VIEW COMPARISON:  02/28/2021 FINDINGS: There is decrease in number of air-filled small bowel loops. There are few residual dilated small bowel loops measuring up to 4.3 cm in diameter. Stomach is not distended. Colon is not distended. IMPRESSION: There is interval decrease in number of dilated small bowel loops, possibly suggesting resolving partial small bowel obstruction. Electronically Signed   By: Elmer Picker M.D.   On: 03/01/2021 14:42    Microbiology: Recent Results (from the past 240 hour(s))  Gastrointestinal Panel by PCR , Stool     Status: None   Collection Time: 02/26/21  5:41 PM   Specimen: STOOL  Result Value Ref Range Status   Campylobacter species NOT DETECTED NOT DETECTED Final   Plesimonas shigelloides NOT DETECTED NOT DETECTED Final   Salmonella species NOT DETECTED NOT DETECTED Final   Yersinia enterocolitica NOT DETECTED NOT DETECTED Final   Vibrio species NOT DETECTED NOT DETECTED Final   Vibrio cholerae NOT DETECTED NOT DETECTED  Final   Enteroaggregative E coli (EAEC) NOT DETECTED NOT DETECTED Final   Enteropathogenic E coli (EPEC) NOT DETECTED NOT DETECTED Final   Enterotoxigenic E coli (ETEC) NOT DETECTED NOT DETECTED Final   Shiga like toxin producing E coli (STEC) NOT DETECTED NOT DETECTED Final   Shigella/Enteroinvasive E coli (EIEC) NOT  DETECTED NOT DETECTED Final   Cryptosporidium NOT DETECTED NOT DETECTED Final   Cyclospora cayetanensis NOT DETECTED NOT DETECTED Final   Entamoeba histolytica NOT DETECTED NOT DETECTED Final   Giardia lamblia NOT DETECTED NOT DETECTED Final   Adenovirus F40/41 NOT DETECTED NOT DETECTED Final   Astrovirus NOT DETECTED NOT DETECTED Final   Norovirus GI/GII NOT DETECTED NOT DETECTED Final   Rotavirus A NOT DETECTED NOT DETECTED Final   Sapovirus (I, II, IV, and V) NOT DETECTED NOT DETECTED Final    Comment: Performed at [facility], [address], [place], [place] [address]  Resp Panel by RT-PCR (Flu A&B, Covid) Nasopharyngeal Swab     Status: None   Collection Time: 02/26/21 10:38 PM   Specimen: Nasopharyngeal Swab; Nasopharyngeal(NP) swabs in vial transport medium  Result Value Ref Range Status   SARS Coronavirus 2 by RT PCR NEGATIVE NEGATIVE Final    Comment: (NOTE) SARS-CoV-2 target nucleic acids are NOT DETECTED.  The SARS-CoV-2 RNA is generally detectable in upper respiratory specimens during the acute phase of infection. The lowest concentration of SARS-CoV-2 viral copies this assay can detect is 138 copies/mL. A negative result does not preclude SARS-Cov-2 infection and should not be used as the sole basis for treatment or other patient management decisions. A negative result may occur with  improper specimen collection/handling, submission of specimen other than nasopharyngeal swab, presence of viral mutation(s) within the areas targeted by this assay, and inadequate number of viral copies(<138 copies/mL). A negative result must be combined  with clinical observations, patient history, and epidemiological information. The expected result is Negative.  Fact Sheet for Patients:  EntrepreneurPulse.com.au  Fact Sheet for Healthcare Providers:  IncredibleEmployment.be  This test is no t yet approved or cleared by the [place] FDA and  has been authorized for detection and/or diagnosis of SARS-CoV-2 by FDA under an Emergency Use Authorization (EUA). This EUA will remain  in effect (meaning this test can be used) for the duration of the COVID-19 declaration under Section 564(b)(1) of the Act, 21 U.S.C.section 360bbb-3(b)(1), unless the authorization is terminated  or revoked sooner.       Influenza A by PCR NEGATIVE NEGATIVE Final   Influenza B by PCR NEGATIVE NEGATIVE Final    Comment: (NOTE) The Xpert Xpress SARS-CoV-2/FLU/RSV plus assay is intended as an aid in the diagnosis of influenza from Nasopharyngeal swab specimens and should not be used as a sole basis for treatment. Nasal washings and aspirates are unacceptable for Xpert Xpress SARS-CoV-2/FLU/RSV testing.  Fact Sheet for Patients: EntrepreneurPulse.com.au  Fact Sheet for Healthcare Providers: IncredibleEmployment.be  This test is not yet approved or cleared by the [place] FDA and has been authorized for detection and/or diagnosis of SARS-CoV-2 by FDA under an Emergency Use Authorization (EUA). This EUA will remain in effect (meaning this test can be used) for the duration of the COVID-19 declaration under Section 564(b)(1) of the Act, 21 U.S.C. section 360bbb-3(b)(1), unless the authorization is terminated or revoked.  Performed at [facility], [address], [place], [place] [address]   Urine Culture     Status: Abnormal   Collection Time: 02/26/21 11:58 PM   Specimen: Urine, Clean Catch  Result Value Ref Range Status   Specimen Description   Final    URINE, CLEAN  CATCH Performed at [facility], [address] [place], [place] [address]    Special Requests   Final    NONE Performed at [facility], [address] [place],  [place] [address]    Culture (A)  Final    10,000 COLONIES/mL MULTIPLE SPECIES PRESENT, SUGGEST RECOLLECTION   Report Status 03/01/2021 FINAL  Final  C Difficile Quick Screen w PCR reflex     Status: None   Collection Time: 02/27/21  5:41 PM   Specimen: STOOL  Result Value Ref Range Status   C Diff antigen NEGATIVE NEGATIVE Final   C Diff toxin NEGATIVE NEGATIVE Final   C Diff interpretation No C. difficile detected.  Final    Comment: Performed at [facility], [address] [place], [place] [address]     Labs: Basic Metabolic Panel: Recent Labs  Lab 02/26/21 2249 02/28/21 1003 03/01/21 0539  NA 137 139 140  K 3.8 4.0 3.9  CL 108 109 109  CO2 23 22 22   GLUCOSE 128* 107* 97  BUN 13 17 14   CREATININE 0.92 0.94 0.85  CALCIUM 8.5* 8.2* 8.2*  MG  --  2.0 2.1   Liver Function Tests: Recent Labs  Lab 02/26/21 2249 02/28/21 1003  AST 14* 13*  ALT 17 17  ALKPHOS 54 47  BILITOT 0.5 0.3  PROT 7.0 6.3*  ALBUMIN 3.8 3.4*   Recent Labs  Lab 02/26/21 2249  LIPASE 34   No results for input(s): AMMONIA in the last 168 hours. CBC: Recent Labs  Lab 02/26/21 2249 02/28/21 1003  WBC 9.9 10.6*  NEUTROABS 7.7 9.1*  HGB 15.3* 13.5  HCT 48.0* 41.6  MCV 94.9 97.0  PLT 279 239   Cardiac Enzymes: No results for input(s): CKTOTAL, CKMB, CKMBINDEX, TROPONINI in the last 168 hours. BNP: Invalid input(s): POCBNP CBG: No results for input(s): GLUCAP in the last 168 hours.  Time coordinating discharge:  36 minutes  Signed:  Orson Eva, DO [facility] Pager: [phone] 03/02/2021, 6:31 PM

## 2021-03-03 LAB — RETICULIN ANTIBODIES, IGA W TITER: Reticulin Ab, IgA: NEGATIVE titer (ref <2.5)

## 2021-03-03 LAB — TISSUE TRANSGLUTAMINASE, IGA: Tissue Transglutaminase Ab, IgA: <2 U/mL (ref 0–3)

## 2021-03-04 DIAGNOSIS — R001 Bradycardia, unspecified: Secondary | ICD-10-CM | POA: Insufficient documentation

## 2021-03-04 LAB — IMMUNOGLOBULINS A/E/G/M, SERUM
IgA: 155 mg/dL (ref 87–352)
IgE (Immunoglobulin E), Serum: 14 IU/mL (ref 6–495)
IgG (Immunoglobin G), Serum: 835 mg/dL (ref 586–1602)
IgM (Immunoglobulin M), Srm: 87 mg/dL (ref 26–217)

## 2021-03-05 ENCOUNTER — Other Ambulatory Visit: Payer: Self-pay

## 2021-03-05 ENCOUNTER — Telehealth: Payer: Self-pay

## 2021-03-05 NOTE — Telephone Encounter (Signed)
Transition Care Management Follow-up Telephone Call Date of discharge and from where: 03/02/21 [facility] Diagnosis: Partial bowel obstruction How have you been since you were released from the hospital? Pt states she is doing okay, just weak. Any questions or concerns? No  Items Reviewed: Did the pt receive and understand the discharge instructions provided? Yes  Medications obtained and verified? Yes  Other? No  Any new allergies since your discharge? No  Dietary orders reviewed? Yes Do you have support at home? Yes   Home Care and Equipment/Supplies: Were home health services ordered? no If so, what is the name of the agency? N/A  Has the agency set up a time to come to the patient's home? not applicable Were any new equipment or medical supplies ordered?  No What is the name of the medical supply agency? N/A Were you able to get the supplies/equipment? not applicable Do you have any questions related to the use of the equipment or supplies? No  Functional Questionnaire: (I = Independent and D = Dependent) ADLs: I  Bathing/Dressing- I  Meal Prep- I  Eating- I  Maintaining continence- I  Transferring/Ambulation- I  Managing Meds- I  Follow up appointments reviewed:  PCP Hospital f/u appt confirmed? Yes  Scheduled to see Dr. Wolfgang Phoenix on 03/12/21 @ 11:20. [facility] f/u appt confirmed? Yes  Scheduled to see [facility] on 03/08/21 @ 8:50. Are transportation arrangements needed? No  If their condition worsens, is the pt aware to call PCP or go to the Emergency Dept.? Yes Was the patient provided with contact information for the PCP's office or ED? Yes Was to pt encouraged to call back with questions or concerns? Yes

## 2021-03-05 NOTE — Patient Outreach (Signed)
[facility]) Care Management  03/05/2021  Ruth Robertson [facility] Dec 29, 1965 [id number]   [place] Organization [ACO] Patient: Ruth Robertson plan  Primary Care Provider:  Kathyrn Drown, MD [place] is an Embedded provider that does the [facility] follow up.  Reviewed for care management as patient will have transition of care follow up from her primary care provider noted.    For additional questions or referrals please contact:   Natividad Brood, RN BSN [facility] Liaison  [phone] business mobile phone Toll free office [phone]  Fax number: [phone] [email or web address]

## 2021-03-07 ENCOUNTER — Telehealth: Payer: Self-pay

## 2021-03-07 NOTE — Telephone Encounter (Signed)
Patient returned call. Advised patient of appt cancellation and reason. Patient expressed understanding.

## 2021-03-07 NOTE — Telephone Encounter (Signed)
-----   Message from [place], MD sent at 03/07/2021  1:02 PM EST ----- This patient was just hospitalized at [facility] and they have arrange follow-up for her with the IBD Clinic at [facility]. She does not need to see me in the office tomorrow. I would not have anything additional to offer.  Thanks.  KLB

## 2021-03-07 NOTE — Telephone Encounter (Signed)
Left message for patient that we were cancelling her appt. With Dr. Tarri Glenn for tomorrow

## 2021-03-08 ENCOUNTER — Ambulatory Visit: Payer: 59 | Admitting: Gastroenterology

## 2021-03-12 ENCOUNTER — Inpatient Hospital Stay: Payer: 59 | Admitting: Family Medicine

## 2021-03-13 ENCOUNTER — Other Ambulatory Visit: Payer: Self-pay

## 2021-03-13 DIAGNOSIS — R87612 Low grade squamous intraepithelial lesion on cytologic smear of cervix (LGSIL): Secondary | ICD-10-CM

## 2021-03-13 DIAGNOSIS — R87810 Cervical high risk human papillomavirus (HPV) DNA test positive: Secondary | ICD-10-CM

## 2021-03-16 ENCOUNTER — Encounter: Payer: Self-pay | Admitting: Family Medicine

## 2021-03-16 ENCOUNTER — Ambulatory Visit: Payer: 59 | Admitting: Family Medicine

## 2021-03-16 ENCOUNTER — Other Ambulatory Visit: Payer: Self-pay

## 2021-03-16 DIAGNOSIS — K828 Other specified diseases of gallbladder: Secondary | ICD-10-CM | POA: Diagnosis not present

## 2021-03-16 DIAGNOSIS — D649 Anemia, unspecified: Secondary | ICD-10-CM | POA: Diagnosis not present

## 2021-03-16 NOTE — Progress Notes (Signed)
° °  Subjective:    Patient ID: Ruth Robertson, female    DOB: [birth year], 56 y.o.   MRN: [id number]  [facility] follow up - gastroenterology  Current lose stool and minimal nausea no vomiting  States had HPV pos pap and will have biopsy on monday  Records from [place] reviewed Records from [facility] health system reviewed Labs reviewed Imaging reviewed All of this discussed with the patient Patient has had a previous hysterectomy some more than likely they will do washings of the vaginal area with associated biopsies as indicated Patient has had reoccurring abdominal symptoms with enteritis so far testing for Crohn's is negative.  She does have some bloating as well as belching could be associated with gallbladder.  She does have an appointment coming up with gastroenterology. Review of Systems     Objective:   Physical Exam  Lungs are clear hearts regular HEENT benign Abdomen is soft mildly bloated subjective discomfort mid abdomen not severe tenderness     Assessment & Plan:  Reoccurring enteritis Follow-up with [facility]   Tubular adenoma-I did encourage her to talk with the [place] doctors regarding when to they recommend repeating her colonoscopy  Gallbladder sludge with intermittent upper abdominal discomfort and nausea-HIDA test await the results  Hypocalcemia-repeat labs  Minimal anemia probably related to multiple blood draws repeat CBC  Follow-up mid summer

## 2021-03-19 ENCOUNTER — Encounter: Payer: Self-pay | Admitting: Obstetrics and Gynecology

## 2021-03-19 ENCOUNTER — Ambulatory Visit: Payer: 59 | Admitting: Obstetrics and Gynecology

## 2021-03-19 ENCOUNTER — Other Ambulatory Visit (HOSPITAL_COMMUNITY)
Admission: RE | Admit: 2021-03-19 | Discharge: 2021-03-19 | Disposition: A | Discharge status: Home / Self Care | Payer: 59 | Source: Ambulatory Visit | Attending: Obstetrics and Gynecology | Admitting: Obstetrics and Gynecology

## 2021-03-19 ENCOUNTER — Ambulatory Visit: Payer: 59

## 2021-03-19 ENCOUNTER — Other Ambulatory Visit: Payer: Self-pay

## 2021-03-19 ENCOUNTER — Ambulatory Visit
Admission: RE | Admit: 2021-03-19 | Discharge: 2021-03-19 | Disposition: A | Discharge status: Home / Self Care | Payer: 59 | Source: Ambulatory Visit | Attending: Family Medicine | Admitting: Family Medicine

## 2021-03-19 VITALS — BP 122/74 | HR 78 | Ht 65.0 in | Wt 165.8 lb

## 2021-03-19 DIAGNOSIS — R87622 Low grade squamous intraepithelial lesion on cytologic smear of vagina (LGSIL): Secondary | ICD-10-CM

## 2021-03-19 DIAGNOSIS — A630 Anogenital (venereal) warts: Secondary | ICD-10-CM

## 2021-03-19 DIAGNOSIS — R87810 Cervical high risk human papillomavirus (HPV) DNA test positive: Secondary | ICD-10-CM | POA: Diagnosis not present

## 2021-03-19 DIAGNOSIS — R87811 Vaginal high risk human papillomavirus (HPV) DNA test positive: Secondary | ICD-10-CM

## 2021-03-19 DIAGNOSIS — Z1231 Encounter for screening mammogram for malignant neoplasm of breast: Secondary | ICD-10-CM

## 2021-03-19 DIAGNOSIS — N890 Mild vaginal dysplasia: Secondary | ICD-10-CM | POA: Diagnosis not present

## 2021-03-19 NOTE — Progress Notes (Signed)
GYNECOLOGY  VISIT   HPI: 56 y.o.   Divorced White or Caucasian Not Hispanic or Latino  female   [id number] with Patient's last menstrual period was 06/12/2012.   here for colposcopy.  H/O cryosurgery of her cervix in her 53's. H/O TVH in 2014. Pap from last month returned with LSIL, +HPV.  She was admitted to the hospital again at the end of January with a partial small bowel obstruction and inflammatory enteritis. She was transferred to [facility]. She has been referred to a GI, IBD specialist.    GYNECOLOGIC HISTORY: Patient's last menstrual period was 06/12/2012. Contraception:hysterectomy  Menopausal hormone therapy: none         OB History     Gravida  1   Para  1   Term  1   Preterm      AB      Living  1      SAB      IAB      Ectopic      Multiple      Live Births                 Patient Active Problem List   Diagnosis Date Noted   Acute lower UTI 02/27/2021   Endometriosis 02/23/2021   History of cervical dysplasia 02/23/2021   Asthma 12/29/2020   Intractable abdominal pain 12/28/2020   Personal history of pneumonia (recurrent) 07/21/2020   Microscopic hematuria 12/03/2017   Lesion of liver 07/17/2017   Partial small bowel obstruction ([place]) 06/19/2017   Ileitis 06/19/2017   Intractable vomiting 06/19/2017   Tobacco abuse 06/19/2017   Depression with anxiety 03/26/2011    Past Medical History:  Diagnosis Date   Anxiety    Asthma    ONLY WITH BRONCHITIS   GAD (generalized anxiety disorder)    GERD (gastroesophageal reflux disease)    Microscopic hematuria 12/03/2017   Negative cystoscopy negative CAT scan   Pneumonia    PONV (postoperative nausea and vomiting)     Past Surgical History:  Procedure Laterality Date   AGILE CAPSULE N/A 07/09/2016   Procedure: AGILE CAPSULE;  Surgeon: Danie Binder, MD;  [facility];  Service: Endoscopy;  Laterality: N/A;  7:30am, pt to arrive at 7:00am   BIOPSY  09/19/2015   Procedure: BIOPSY;   Surgeon: Danie Binder, MD;  [facility];  Service: Endoscopy;;  random colon bx's   COLONOSCOPY WITH PROPOFOL N/A 09/19/2015   Dr. Oneida Alar: five 2-3 mm polyps in rectum (hyperplastic) and descending colon, diverticulosis in sigmoid colon and transverse colon. Query post-infectious IBS. Next colonoscopy in 10 years    CYST EXCISION N/A 01/20/2019   Procedure: EXCISION OF RIGHT GROIN SEBACEOUS CYST;  Surgeon: Erroll Luna, MD;  Location: [place];  Service: General;  Laterality: N/A;   GIVENS CAPSULE STUDY N/A 07/30/2016   Procedure: GIVENS CAPSULE STUDY;  Surgeon: Danie Binder, MD;  [facility];  Service: Endoscopy;  Laterality: N/A;   HYSTEROSCOPY  2006   POLYPECTOMY  09/19/2015   Procedure: POLYPECTOMY;  Surgeon: Danie Binder, MD;  [facility];  Service: Endoscopy;;  descending colon polyps x3 cold bx, rectal polyp x2   SALPINGOOPHORECTOMY Bilateral 08/18/2012   Procedure: SALPINGO OOPHORECTOMY;  Surgeon: Anastasio Auerbach, MD;  Location: [place] [facility];  Service: Gynecology;  Laterality: Bilateral;   VAGINAL HYSTERECTOMY N/A 08/18/2012   Procedure: HYSTERECTOMY VAGINAL;  Surgeon: Anastasio Auerbach, MD;  Location: [place] [facility];  Service: Gynecology;  Laterality: N/A;    Current Outpatient Medications  Medication Sig Dispense Refill   acetaminophen (TYLENOL) 325 MG tablet Take 2 tablets (650 mg total) by mouth every 6 (six) hours as needed for mild pain (or Fever >/= 101). (Patient not taking: Reported on 03/16/2021)     HYDROcodone-acetaminophen (NORCO) 5-325 MG tablet Take 1 tablet by mouth every 4 (four) hours as needed for moderate pain. (Patient not taking: Reported on 03/16/2021) 6 tablet 0   ondansetron (ZOFRAN) 8 MG tablet Take 1 tablet (8 mg total) by mouth every 8 (eight) hours as needed for nausea. (Patient not taking: Reported on 02/23/2021) 15 tablet 2   predniSONE (DELTASONE) 50 MG tablet Take 1 tablet (50 mg total) by mouth daily. (Patient  not taking: Reported on 03/16/2021) 5 tablet 0   No current facility-administered medications for this visit.     ALLERGIES: Patient has no known allergies.  Family History  Problem Relation Age of Onset   Heart attack Mother    Heart failure Father    Stroke Father    Hypertension Maternal Grandmother    Diabetes Paternal Grandfather    Breast cancer Maternal Aunt        50's   Colon cancer Maternal Aunt        late 12s or 55s   Inflammatory bowel disease Neg Hx     Social History   Socioeconomic History   Marital status: Divorced    Spouse name: Not on file   Number of children: Not on file   Years of education: Not on file   Highest education level: Not on file  Occupational History   Not on file  Tobacco Use   Smoking status: Every Day    Packs/day: 1.00    Years: 25.00    Pack years: 25.00    Types: Cigarettes   Smokeless tobacco: Never  Vaping Use   Vaping Use: Never used  Substance and Sexual Activity   Alcohol use: Not Currently    Alcohol/week: 0.0 standard drinks    Comment: rare   Drug use: No   Sexual activity: Yes    Partners: Male    Comment: HYST-1st intercourse 33 yo-5 partners  Other Topics Concern   Not on file  Social History Narrative   Not on file   Social Determinants of Health   Financial Resource Strain: Not on file  Food Insecurity: Not on file  Transportation Needs: Not on file  Physical Activity: Not on file  Stress: Not on file  Social Connections: Not on file  Intimate Partner Violence: Not on file    Review of Systems  All other systems reviewed and are negative.  PHYSICAL EXAMINATION:    LMP 06/12/2012     General appearance: alert, cooperative and appears stated age  Pelvic: External genitalia:  no lesions              Urethra:  normal appearing urethra with no masses, tenderness or lesions              Bartholins and Skenes: normal                 Vagina: normal appearing vagina with normal color and discharge,  no lesions              Cervix: absent  Colposcopy of entire vagina, no aceto-white changes. Several small areas of mildly decreased lugols uptake. Vaginal biopsy taken at 3 o'clock in the upper vagina  and on the vaginal apex at 11 o'clock. No bleeding from the biopsy sites.                Chaperone was present for exam.  1. 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 HPV (human papilloma virus) anogenital infection - Colposcopy - Surgical pathology( [place]/ [facility])

## 2021-03-19 NOTE — Patient Instructions (Signed)

## 2021-03-20 ENCOUNTER — Other Ambulatory Visit: Payer: Self-pay | Admitting: Family Medicine

## 2021-03-20 DIAGNOSIS — R928 Other abnormal and inconclusive findings on diagnostic imaging of breast: Secondary | ICD-10-CM

## 2021-03-20 LAB — SURGICAL PATHOLOGY

## 2021-03-27 ENCOUNTER — Encounter: Payer: Self-pay | Admitting: Family Medicine

## 2021-03-31 ENCOUNTER — Ambulatory Visit
Admission: RE | Admit: 2021-03-31 | Discharge: 2021-03-31 | Disposition: A | Discharge status: Home / Self Care | Payer: 59 | Source: Ambulatory Visit | Attending: Family Medicine | Admitting: Family Medicine

## 2021-03-31 DIAGNOSIS — R921 Mammographic calcification found on diagnostic imaging of breast: Secondary | ICD-10-CM | POA: Diagnosis not present

## 2021-03-31 DIAGNOSIS — R928 Other abnormal and inconclusive findings on diagnostic imaging of breast: Secondary | ICD-10-CM

## 2021-04-05 DIAGNOSIS — D649 Anemia, unspecified: Secondary | ICD-10-CM | POA: Diagnosis not present

## 2021-04-05 DIAGNOSIS — K828 Other specified diseases of gallbladder: Secondary | ICD-10-CM | POA: Diagnosis not present

## 2021-04-06 LAB — CBC WITH DIFFERENTIAL/PLATELET
Basophils Absolute: 0 10*3/uL (ref 0.0–0.2)
Basos: 1 %
EOS (ABSOLUTE): 0.2 10*3/uL (ref 0.0–0.4)
Eos: 2 %
Hematocrit: 43.9 % (ref 34.0–46.6)
Hemoglobin: 13.8 g/dL (ref 11.1–15.9)
Immature Grans (Abs): 0 10*3/uL (ref 0.0–0.1)
Immature Granulocytes: 0 %
Lymphocytes Absolute: 1.4 10*3/uL (ref 0.7–3.1)
Lymphs: 19 %
MCH: 28.4 pg (ref 26.6–33.0)
MCHC: 31.4 g/dL — ABNORMAL LOW (ref 31.5–35.7)
MCV: 90 fL (ref 79–97)
Monocytes Absolute: 0.4 10*3/uL (ref 0.1–0.9)
Monocytes: 5 %
Neutrophils Absolute: 5.3 10*3/uL (ref 1.4–7.0)
Neutrophils: 73 %
Platelets: 330 10*3/uL (ref 150–450)
RBC: 4.86 x10E6/uL (ref 3.77–5.28)
RDW: 12.6 % (ref 11.7–15.4)
WBC: 7.4 10*3/uL (ref 3.4–10.8)

## 2021-04-06 LAB — COMPREHENSIVE METABOLIC PANEL
ALT: 23 IU/L (ref 0–32)
AST: 20 IU/L (ref 0–40)
Albumin/Globulin Ratio: 1.4 (ref 1.2–2.2)
Albumin: 4.3 g/dL (ref 3.8–4.9)
Alkaline Phosphatase: 139 IU/L — ABNORMAL HIGH (ref 44–121)
BUN/Creatinine Ratio: 16 (ref 9–23)
BUN: 12 mg/dL (ref 6–24)
Bilirubin Total: 0.3 mg/dL (ref 0.0–1.2)
CO2: 27 mmol/L (ref 20–29)
Calcium: 9.3 mg/dL (ref 8.7–10.2)
Chloride: 98 mmol/L (ref 96–106)
Creatinine, Ser: 0.76 mg/dL (ref 0.57–1.00)
Globulin, Total: 3.1 g/dL (ref 1.5–4.5)
Glucose: 109 mg/dL — ABNORMAL HIGH (ref 70–99)
Potassium: 4 mmol/L (ref 3.5–5.2)
Sodium: 139 mmol/L (ref 134–144)
Total Protein: 7.4 g/dL (ref 6.0–8.5)
eGFR: 92 mL/min/{1.73_m2} (ref >59)

## 2021-04-17 ENCOUNTER — Ambulatory Visit (HOSPITAL_COMMUNITY): Admission: RE | Admit: 2021-04-17 | Payer: 59 | Source: Ambulatory Visit

## 2021-04-17 ENCOUNTER — Other Ambulatory Visit: Payer: Self-pay

## 2021-04-19 ENCOUNTER — Encounter (HOSPITAL_COMMUNITY)
Admission: RE | Admit: 2021-04-19 | Discharge: 2021-04-19 | Disposition: A | Discharge status: Home / Self Care | Payer: 59 | Source: Ambulatory Visit | Attending: Family Medicine | Admitting: Family Medicine

## 2021-04-19 ENCOUNTER — Encounter (HOSPITAL_COMMUNITY): Payer: Self-pay

## 2021-04-19 ENCOUNTER — Other Ambulatory Visit: Payer: Self-pay

## 2021-04-19 DIAGNOSIS — K828 Other specified diseases of gallbladder: Secondary | ICD-10-CM | POA: Diagnosis not present

## 2021-04-19 DIAGNOSIS — R109 Unspecified abdominal pain: Secondary | ICD-10-CM | POA: Diagnosis not present

## 2021-04-19 DIAGNOSIS — D649 Anemia, unspecified: Secondary | ICD-10-CM | POA: Insufficient documentation

## 2021-04-19 MED ORDER — TECHNETIUM TC 99M MEBROFENIN IV KIT
5.0000 | PACK | Freq: Once | INTRAVENOUS | Status: AC | PRN
  Administered 2021-04-19: 5.4 via INTRAVENOUS

## 2021-04-24 ENCOUNTER — Other Ambulatory Visit: Payer: Self-pay

## 2021-04-24 DIAGNOSIS — K828 Other specified diseases of gallbladder: Secondary | ICD-10-CM | POA: Diagnosis not present

## 2021-04-24 DIAGNOSIS — D649 Anemia, unspecified: Secondary | ICD-10-CM | POA: Diagnosis not present

## 2021-04-25 LAB — COMPREHENSIVE METABOLIC PANEL
ALT: 32 IU/L (ref 0–32)
AST: 21 IU/L (ref 0–40)
Albumin/Globulin Ratio: 1.5 (ref 1.2–2.2)
Albumin: 3.9 g/dL (ref 3.8–4.9)
Alkaline Phosphatase: 70 IU/L (ref 44–121)
BUN/Creatinine Ratio: 16 (ref 9–23)
BUN: 14 mg/dL (ref 6–24)
Bilirubin Total: 0.2 mg/dL (ref 0.0–1.2)
CO2: 22 mmol/L (ref 20–29)
Calcium: 9.5 mg/dL (ref 8.7–10.2)
Chloride: 106 mmol/L (ref 96–106)
Creatinine, Ser: 0.9 mg/dL (ref 0.57–1.00)
Globulin, Total: 2.6 g/dL (ref 1.5–4.5)
Glucose: 98 mg/dL (ref 70–99)
Potassium: 4.8 mmol/L (ref 3.5–5.2)
Sodium: 142 mmol/L (ref 134–144)
Total Protein: 6.5 g/dL (ref 6.0–8.5)
eGFR: 75 mL/min/{1.73_m2} (ref >59)

## 2021-04-25 LAB — CBC WITH DIFFERENTIAL/PLATELET
Basophils Absolute: 0.1 10*3/uL (ref 0.0–0.2)
Basos: 1 %
EOS (ABSOLUTE): 0.3 10*3/uL (ref 0.0–0.4)
Eos: 4 %
Hematocrit: 46 % (ref 34.0–46.6)
Hemoglobin: 15.1 g/dL (ref 11.1–15.9)
Immature Grans (Abs): 0 10*3/uL (ref 0.0–0.1)
Immature Granulocytes: 0 %
Lymphocytes Absolute: 2 10*3/uL (ref 0.7–3.1)
Lymphs: 29 %
MCH: 30.4 pg (ref 26.6–33.0)
MCHC: 32.8 g/dL (ref 31.5–35.7)
MCV: 93 fL (ref 79–97)
Monocytes Absolute: 0.6 10*3/uL (ref 0.1–0.9)
Monocytes: 9 %
Neutrophils Absolute: 3.8 10*3/uL (ref 1.4–7.0)
Neutrophils: 57 %
Platelets: 294 10*3/uL (ref 150–450)
RBC: 4.97 x10E6/uL (ref 3.77–5.28)
RDW: 12.8 % (ref 11.7–15.4)
WBC: 6.9 10*3/uL (ref 3.4–10.8)

## 2021-05-14 DIAGNOSIS — M2550 Pain in unspecified joint: Secondary | ICD-10-CM | POA: Diagnosis not present

## 2021-05-14 DIAGNOSIS — K529 Noninfective gastroenteritis and colitis, unspecified: Secondary | ICD-10-CM | POA: Diagnosis not present

## 2021-05-25 DIAGNOSIS — R109 Unspecified abdominal pain: Secondary | ICD-10-CM | POA: Diagnosis not present

## 2021-05-25 DIAGNOSIS — R112 Nausea with vomiting, unspecified: Secondary | ICD-10-CM | POA: Diagnosis not present

## 2021-05-25 DIAGNOSIS — R197 Diarrhea, unspecified: Secondary | ICD-10-CM | POA: Diagnosis not present

## 2021-05-31 DIAGNOSIS — M130 Polyarthritis, unspecified: Secondary | ICD-10-CM | POA: Diagnosis not present

## 2021-05-31 DIAGNOSIS — M12872 Other specific arthropathies, not elsewhere classified, left ankle and foot: Secondary | ICD-10-CM | POA: Diagnosis not present

## 2021-05-31 DIAGNOSIS — M064 Inflammatory polyarthropathy: Secondary | ICD-10-CM | POA: Diagnosis not present

## 2021-05-31 DIAGNOSIS — M12871 Other specific arthropathies, not elsewhere classified, right ankle and foot: Secondary | ICD-10-CM | POA: Diagnosis not present

## 2021-05-31 DIAGNOSIS — R197 Diarrhea, unspecified: Secondary | ICD-10-CM | POA: Diagnosis not present

## 2021-08-02 DIAGNOSIS — M064 Inflammatory polyarthropathy: Secondary | ICD-10-CM | POA: Diagnosis not present

## 2021-09-07 ENCOUNTER — Ambulatory Visit: Payer: 59 | Admitting: Family Medicine

## 2021-10-02 DIAGNOSIS — M47819 Spondylosis without myelopathy or radiculopathy, site unspecified: Secondary | ICD-10-CM | POA: Diagnosis not present

## 2021-10-02 DIAGNOSIS — R197 Diarrhea, unspecified: Secondary | ICD-10-CM | POA: Diagnosis not present

## 2021-10-22 NOTE — Telephone Encounter (Signed)
Per appt desk pt was seen on 01/12/22

## 2021-12-13 ENCOUNTER — Other Ambulatory Visit (HOSPITAL_COMMUNITY): Payer: Self-pay

## 2021-12-13 MED ORDER — SULFASALAZINE 500 MG PO TBEC
1000.0000 mg | DELAYED_RELEASE_TABLET | Freq: Two times a day (BID) | ORAL | 5 refills | Status: DC
  Filled 2021-12-13: qty 120, 30d supply, fill #0

## 2021-12-13 MED ORDER — SULFASALAZINE 500 MG PO TBEC
1000.0000 mg | DELAYED_RELEASE_TABLET | Freq: Two times a day (BID) | ORAL | 1 refills | Status: DC
  Filled 2021-12-13: qty 120, 30d supply, fill #0

## 2021-12-14 ENCOUNTER — Other Ambulatory Visit (HOSPITAL_COMMUNITY): Payer: Self-pay

## 2022-01-16 ENCOUNTER — Other Ambulatory Visit (HOSPITAL_COMMUNITY): Payer: Self-pay

## 2022-01-16 DIAGNOSIS — Z79899 Other long term (current) drug therapy: Secondary | ICD-10-CM | POA: Diagnosis not present

## 2022-01-16 DIAGNOSIS — M47819 Spondylosis without myelopathy or radiculopathy, site unspecified: Secondary | ICD-10-CM | POA: Diagnosis not present

## 2022-01-16 DIAGNOSIS — M064 Inflammatory polyarthropathy: Secondary | ICD-10-CM | POA: Diagnosis not present

## 2022-01-16 MED ORDER — SULFASALAZINE 500 MG PO TBEC
1000.0000 mg | DELAYED_RELEASE_TABLET | Freq: Two times a day (BID) | ORAL | 1 refills | Status: DC
  Filled 2022-01-16: qty 80, 20d supply, fill #0
  Filled 2022-01-18: qty 280, 70d supply, fill #0
  Filled 2022-05-15: qty 220, 55d supply, fill #1
  Filled 2022-05-16: qty 140, 35d supply, fill #1

## 2022-01-18 ENCOUNTER — Other Ambulatory Visit (HOSPITAL_COMMUNITY): Payer: Self-pay

## 2022-01-21 ENCOUNTER — Other Ambulatory Visit (HOSPITAL_COMMUNITY): Payer: Self-pay

## 2022-01-21 ENCOUNTER — Other Ambulatory Visit: Payer: Self-pay

## 2022-01-22 ENCOUNTER — Other Ambulatory Visit: Payer: Self-pay

## 2022-03-04 ENCOUNTER — Other Ambulatory Visit: Payer: Self-pay | Admitting: Family Medicine

## 2022-03-04 DIAGNOSIS — Z1231 Encounter for screening mammogram for malignant neoplasm of breast: Secondary | ICD-10-CM

## 2022-03-12 NOTE — Progress Notes (Signed)
57 y.o. G70P1001 Divorced White or Caucasian Not Hispanic or Latino female here for annual exam.  H/O TVH/BSO in 2014, ot on ERT.    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   She has 3-4 loose BM's a day.  Patient's last menstrual period was 06/12/2012.          Sexually active: No.  The current method of family planning is status post hysterectomy.    Exercising: Yes.    The patient has a physically strenuous job, but has no regular exercise apart from work.  Smoker:  yes less than a pack a day (little more than 1/2 a pack).   Health Maintenance: Pap: 1/20/23LSIL Hr HPV +; 11/28/16 WNL  10/21/13 WNL  History of abnormal Pap: yes Colpo 03/19/21, 2 biopsies, both with VAIN I h/o cryosurgery of her cervix ~29 years ago  MMG:  03/19/21 incomplete  BMD:   unsure  Colonoscopy: 03/02/21 1 polyp removed. TDaP:  08/27/18  Gardasil: na   reports that she has been smoking cigarettes. She has a 25.00 pack-year smoking history. She has never used smokeless tobacco. She reports that she does not currently use alcohol. She reports that she does not use drugs. Rare ETOH. She works as a Orthoptist for [employer]. Works part time at [employer] and for [employer]. Son is 31, married, baby is due in August.  Past Medical History:  Diagnosis Date   Anxiety    Asthma    ONLY WITH BRONCHITIS   GAD (generalized anxiety disorder)    GERD (gastroesophageal reflux disease)    Microscopic hematuria 12/03/2017   Negative cystoscopy negative CAT scan   Pneumonia    PONV (postoperative nausea and vomiting)     Past Surgical History:  Procedure Laterality Date   AGILE CAPSULE N/A 07/09/2016   Procedure: AGILE CAPSULE;  Surgeon: Danie Binder, MD;  [facility];  Service: Endoscopy;  Laterality: N/A;  7:30am, pt to arrive at 7:00am   BIOPSY  09/19/2015   Procedure: BIOPSY;  Surgeon: Danie Binder, MD;   [facility];  Service: Endoscopy;;  random colon bx's   COLONOSCOPY WITH PROPOFOL N/A 09/19/2015   Dr. Oneida Alar: five 2-3 mm polyps in rectum (hyperplastic) and descending colon, diverticulosis in sigmoid colon and transverse colon. Query post-infectious IBS. Next colonoscopy in 10 years    CYST EXCISION N/A 01/20/2019   Procedure: EXCISION OF RIGHT GROIN SEBACEOUS CYST;  Surgeon: Erroll Luna, MD;  Location: [place];  Service: General;  Laterality: N/A;   GIVENS CAPSULE STUDY N/A 07/30/2016   Procedure: GIVENS CAPSULE STUDY;  Surgeon: Danie Binder, MD;  [facility];  Service: Endoscopy;  Laterality: N/A;   HYSTEROSCOPY  2006   POLYPECTOMY  09/19/2015   Procedure: POLYPECTOMY;  Surgeon: Danie Binder, MD;  [facility];  Service: Endoscopy;;  descending colon polyps x3 cold bx, rectal polyp x2   SALPINGOOPHORECTOMY Bilateral 08/18/2012   Procedure: SALPINGO OOPHORECTOMY;  Surgeon: Anastasio Auerbach, MD;  Location: [facility] [facility];  Service: Gynecology;  Laterality: Bilateral;   VAGINAL HYSTERECTOMY N/A 08/18/2012   Procedure: HYSTERECTOMY VAGINAL;  Surgeon: Anastasio Auerbach, MD;  Location: [name] [facility];  Service: Gynecology;  Laterality: N/A;    Current Outpatient Medications  Medication Sig Dispense Refill   acetaminophen (TYLENOL) 325 MG tablet Take 2 tablets (650 mg total) by mouth every 6 (six) hours as needed for mild  pain (or Fever >/= 101).     ondansetron (ZOFRAN) 8 MG tablet Take 1 tablet (8 mg total) by mouth every 8 (eight) hours as needed for nausea. 15 tablet 2   sulfaSALAzine (AZULFIDINE) 500 MG EC tablet Take 2 tablets (1,000 mg total) by mouth 2 (two) times daily with food 120 tablet 1   sulfaSALAzine (AZULFIDINE) 500 MG EC tablet Take 2 tablets (1,000 mg total) by mouth 2 (two) times daily. Take with food 120 tablet 5   sulfaSALAzine (AZULFIDINE) 500 MG EC tablet Take 2 tablets (1,000 mg total) by mouth 2 (two) times daily with food. 360  tablet 1   No current facility-administered medications for this visit.    Family History  Problem Relation Age of Onset   Heart attack Mother    Heart failure Father    Stroke Father    Hypertension Maternal Grandmother    Diabetes Paternal Grandfather    Breast cancer Maternal Aunt        50's   Colon cancer Maternal Aunt        late 30s or 50s   Inflammatory bowel disease Neg Hx     Review of Systems  All other systems reviewed and are negative.   Exam:   LMP 06/12/2012   Weight change: @WEIGHTCHANGE$ @ Height:      Ht Readings from Last 3 Encounters:  03/19/21 5' 5"$  (1.651 m)  03/16/21 5' 5"$  (1.651 m)  02/26/21 5' 5"$  (1.651 m)    General appearance: alert, cooperative and appears stated age Head: Normocephalic, without obvious abnormality, atraumatic Neck: no adenopathy, supple, symmetrical, trachea midline and thyroid normal to inspection and palpation Lungs: clear to auscultation bilaterally Cardiovascular: regular rate and rhythm Breasts: normal appearance, no masses or tenderness Abdomen: soft, non-tender; non distended,  no masses,  no organomegaly Extremities: extremities normal, atraumatic, no cyanosis or edema Skin: Skin color, texture, turgor normal. No rashes or lesions Lymph nodes: Cervical, supraclavicular, and axillary nodes normal. No abnormal inguinal nodes palpated Neurologic: Grossly normal   Pelvic: External genitalia:  no lesions              Urethra:  normal appearing urethra with no masses, tenderness or lesions              Bartholins and Skenes: normal                 Vagina: atrophic appearing vagina with normal color and discharge, no lesions              Cervix: absent               Bimanual Exam:  Uterus:  uterus absent              Adnexa: no mass, fullness, tenderness               Rectovaginal: Confirms               Anus:  normal sphincter tone, no lesions  Ruth Robertson, [name] chaperoned for the exam.  1. Well woman exam Discussed  breast self exam Discussed calcium and vit D intake Labs with primary and specialists Mammogram is scheduled Colonoscopy is UTD She will talk with her primary about Chantix   2. History of vaginal dysplasia - Cytology - PAP  3. Screening for vaginal cancer - Cytology - PAP  4. Hypoestrogenism - DG Bone Density; Future

## 2022-03-19 ENCOUNTER — Ambulatory Visit (INDEPENDENT_AMBULATORY_CARE_PROVIDER_SITE_OTHER): Payer: Commercial Managed Care - PPO | Admitting: Obstetrics and Gynecology

## 2022-03-19 ENCOUNTER — Encounter: Payer: Self-pay | Admitting: Obstetrics and Gynecology

## 2022-03-19 ENCOUNTER — Other Ambulatory Visit (HOSPITAL_COMMUNITY)
Admission: RE | Admit: 2022-03-19 | Discharge: 2022-03-19 | Disposition: A | Discharge status: Home / Self Care | Payer: Commercial Managed Care - PPO | Source: Ambulatory Visit | Attending: Obstetrics and Gynecology | Admitting: Obstetrics and Gynecology

## 2022-03-19 VITALS — BP 124/68 | HR 85 | Ht 65.0 in | Wt 182.0 lb

## 2022-03-19 DIAGNOSIS — Z01419 Encounter for gynecological examination (general) (routine) without abnormal findings: Secondary | ICD-10-CM | POA: Diagnosis not present

## 2022-03-19 DIAGNOSIS — Z87411 Personal history of vaginal dysplasia: Secondary | ICD-10-CM | POA: Insufficient documentation

## 2022-03-19 DIAGNOSIS — Z1272 Encounter for screening for malignant neoplasm of vagina: Secondary | ICD-10-CM | POA: Insufficient documentation

## 2022-03-19 DIAGNOSIS — E2839 Other primary ovarian failure: Secondary | ICD-10-CM

## 2022-03-19 NOTE — Patient Instructions (Signed)

## 2022-03-21 ENCOUNTER — Ambulatory Visit: Payer: Commercial Managed Care - PPO | Admitting: Family Medicine

## 2022-03-21 ENCOUNTER — Encounter: Payer: Self-pay | Admitting: Family Medicine

## 2022-03-21 VITALS — BP 119/83 | HR 68 | Temp 97.9°F | Wt 182.8 lb

## 2022-03-21 DIAGNOSIS — F17200 Nicotine dependence, unspecified, uncomplicated: Secondary | ICD-10-CM

## 2022-03-21 DIAGNOSIS — J019 Acute sinusitis, unspecified: Secondary | ICD-10-CM

## 2022-03-21 LAB — CYTOLOGY - PAP
Comment: NEGATIVE
Diagnosis: UNDETERMINED — AB
High risk HPV: POSITIVE — AB

## 2022-03-21 MED ORDER — AMOXICILLIN-POT CLAVULANATE 875-125 MG PO TABS: 1.0000 | ORAL_TABLET | Freq: Two times a day (BID) | ORAL | 0 refills | Status: DC

## 2022-03-21 NOTE — Progress Notes (Signed)
   Subjective:    Patient ID: Ruth Robertson, female    DOB: Oct 06, 1965, 57 y.o.   MRN: [id number]  HPI Pt arrives for ear pain, drainage, sore throat on and off (mostly in AM), pressure around eyes. Cough at times, productive. Wheezing here and there. Pt states she had a cold back in December.  Patient overall doing well this up still smokes she knows she needs to quit its putting her at risk for heart disease and stroke heart attack and cancer  Review of Systems     Objective:   Physical Exam Gen-NAD not toxic TMS-normal bilateral T- normal no redness Chest-CTA respiratory rate normal no crackles CV RRR no murmur Skin-warm dry Neuro-grossly normal        Assessment & Plan:   Acute rhinosinusitis Antibiotics prescribed May use Flonase and allergy tablets. If ongoing troubles or progressive issues recommend follow-up Send [place] a MyChart message if needing a second round of antibiotics   Lung cancer screening recommended patient will think about it  Smoking cessation recommended options given patient will think about

## 2022-03-26 ENCOUNTER — Other Ambulatory Visit: Payer: Self-pay | Admitting: *Deleted

## 2022-03-26 DIAGNOSIS — R87610 Atypical squamous cells of undetermined significance on cytologic smear of cervix (ASC-US): Secondary | ICD-10-CM

## 2022-04-12 NOTE — Progress Notes (Signed)
GYNECOLOGY  VISIT   HPI: 57 y.o.   Divorced White or Caucasian Not Hispanic or Latino  female   [id number] with Patient's last menstrual period was 06/12/2012.   here for colpo for an ASCUS/+HPV pap. She has a h/o a hysterectomy. H/O VAIN I last year.   GYNECOLOGIC HISTORY: Patient's last menstrual period was 06/12/2012. 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/02/2021   Bradycardia 03/04/2021   Acute lower UTI 02/27/2021   Endometriosis 02/23/2021   History of cervical dysplasia 02/23/2021   Asthma 12/29/2020   Personal history of pneumonia (recurrent) 07/21/2020   Microscopic hematuria 12/03/2017   Lesion of liver 07/17/2017   Tobacco abuse 06/19/2017   Depression with anxiety 03/26/2011    Past Medical History:  Diagnosis Date   Anxiety    Asthma    ONLY WITH BRONCHITIS   GAD (generalized anxiety disorder)    GERD (gastroesophageal reflux disease)    Microscopic hematuria 12/03/2017   Negative cystoscopy negative CAT scan   Pneumonia    PONV (postoperative nausea and vomiting)     Past Surgical History:  Procedure Laterality Date   AGILE CAPSULE N/A 07/09/2016   Procedure: AGILE CAPSULE;  Surgeon: Danie Binder, MD;  [facility]: [facility];  Service: Endoscopy;  Laterality: N/A;  7:30am, pt to arrive at 7:00am   BIOPSY  09/19/2015   Procedure: BIOPSY;  Surgeon: Danie Binder, MD;  Location: [facility];  Service: Endoscopy;;  random colon bx's   COLONOSCOPY WITH PROPOFOL N/A 09/19/2015   Dr. Oneida Alar: five 2-3 mm polyps in rectum (hyperplastic) and descending colon, diverticulosis in sigmoid colon and transverse colon. Query post-infectious IBS.  Next colonoscopy in 10 years    CYST EXCISION N/A 01/20/2019   Procedure: EXCISION OF RIGHT GROIN SEBACEOUS CYST;  Surgeon: Erroll Luna, MD;  Location: [place];  Service: General;  Laterality: N/A;   GIVENS CAPSULE STUDY N/A 07/30/2016   Procedure: GIVENS CAPSULE STUDY;  Surgeon: Danie Binder, MD;  Location: [facility];  Service: Endoscopy;  Laterality: N/A;   HYSTEROSCOPY  2006   POLYPECTOMY  09/19/2015   Procedure: POLYPECTOMY;  Surgeon: Danie Binder, MD;  Location: [facility];  Service: Endoscopy;;  descending colon polyps x3 cold bx, rectal polyp x2   SALPINGOOPHORECTOMY Bilateral 08/18/2012   Procedure: SALPINGO OOPHORECTOMY;  Surgeon: Anastasio Auerbach, MD;  Location: [facility];  Service: Gynecology;  Laterality: Bilateral;   VAGINAL HYSTERECTOMY N/A 08/18/2012   Procedure: HYSTERECTOMY VAGINAL;  Surgeon: Anastasio Auerbach, MD;  Location: [facility];  Service: Gynecology;  Laterality: N/A;    Current Outpatient Medications  Medication Sig Dispense Refill   acetaminophen (TYLENOL) 325 MG tablet Take 2 tablets (650 mg total) by mouth every 6 (six) hours as needed for mild pain (or Fever >/= 101).     sulfaSALAzine (AZULFIDINE) 500 MG  EC tablet Take 2 tablets (1,000 mg total) by mouth 2 (two) times daily with food 120 tablet 1   sulfaSALAzine (AZULFIDINE) 500 MG EC tablet Take 2 tablets (1,000 mg total) by mouth 2 (two) times daily. Take with food 120 tablet 5   sulfaSALAzine (AZULFIDINE) 500 MG EC tablet Take 2 tablets (1,000 mg total) by mouth 2 (two) times daily with food. 360 tablet 1   ondansetron (ZOFRAN) 8 MG tablet Take 1 tablet (8 mg total) by mouth every 8 (eight) hours as needed for nausea. (Patient not taking: Reported on 04/26/2022) 15 tablet 2   No current facility-administered medications for this visit.     ALLERGIES: Patient has no known allergies.  Family History  Problem Relation Age of Onset   Heart attack Mother    Heart failure Father     Stroke Father    Hypertension Maternal Grandmother    Diabetes Paternal Grandfather    Breast cancer Maternal Aunt        50's   Colon cancer Maternal Aunt        late 10s or 8s   Inflammatory bowel disease Neg Hx     Social History   Socioeconomic History   Marital status: Divorced    Spouse name: Not on file   Number of children: Not on file   Years of education: Not on file   Highest education level: Not on file  Occupational History   Not on file  Tobacco Use   Smoking status: Every Day    Packs/day: 1.00    Years: 25.00    Additional pack years: 0.00    Total pack years: 25.00    Types: Cigarettes   Smokeless tobacco: Never  Vaping Use   Vaping Use: Never used  Substance and Sexual Activity   Alcohol use: Not Currently    Alcohol/week: 0.0 standard drinks of alcohol    Comment: rare   Drug use: No   Sexual activity: Not Currently    Partners: Male    Comment: HYST-1st intercourse 66 yo-5 partners  Other Topics Concern   Not on file  Social History Narrative   Not on file   Social Determinants of Health   Financial Resource Strain: Not on file  Food Insecurity: Not on file  Transportation Needs: Not on file  Physical Activity: Not on file  Stress: Not on file  Social Connections: Not on file  Intimate Partner Violence: Not on file    Review of Systems  All other systems reviewed and are negative.   PHYSICAL EXAMINATION:    BP 116/74 (BP Location: Right Arm, Patient Position: Sitting, Cuff Size: Normal)   Pulse 76   Ht 5\' 5"  (1.651 m)   Wt 184 lb (83.5 kg)   LMP 06/12/2012   SpO2 95%   BMI 30.62 kg/m     General appearance: alert, cooperative and appears stated age  Pelvic: External genitalia:  no lesions              Urethra:  normal appearing urethra with no masses, tenderness or lesions              Bartholins and Skenes: normal                 Vagina: normal appearing vagina with normal color and discharge, no lesions               Cervix: absent  Colposcopy of entire vagina was performed. No aceto-white changes.  Decreased lugols uptake on bilateral vaginal sidewalls, spotty decreased uptake at the apex. I suspect some of the decreased uptake is from atrophy. Biopsies were taken at 3 o'clock on the upper vaginal wall and 9 o'clock at the vaginal apex. Silver nir               Chaperone was present for exam.  1. Atypical squamous cell changes of undetermined significance (ASCUS) on vaginal cytology with positive high risk human papilloma virus (HPV) Some of her vaginal changes on colposcopy could be due to atrophy, we discussed possibly pre-treating her with vaginal estrogen prior to future colposcopies - Colposcopy - Surgical pathology( [place])  2. History of vaginal dysplasia - Surgical pathology( [place]/ [facility])

## 2022-04-18 ENCOUNTER — Ambulatory Visit
Admission: RE | Admit: 2022-04-18 | Discharge: 2022-04-18 | Disposition: A | Discharge status: Home / Self Care | Payer: Commercial Managed Care - PPO | Source: Ambulatory Visit | Attending: Family Medicine | Admitting: Family Medicine

## 2022-04-18 DIAGNOSIS — H524 Presbyopia: Secondary | ICD-10-CM | POA: Diagnosis not present

## 2022-04-18 DIAGNOSIS — Z1231 Encounter for screening mammogram for malignant neoplasm of breast: Secondary | ICD-10-CM

## 2022-04-18 DIAGNOSIS — H52203 Unspecified astigmatism, bilateral: Secondary | ICD-10-CM | POA: Diagnosis not present

## 2022-04-18 DIAGNOSIS — H5203 Hypermetropia, bilateral: Secondary | ICD-10-CM | POA: Diagnosis not present

## 2022-04-23 DIAGNOSIS — M159 Polyosteoarthritis, unspecified: Secondary | ICD-10-CM | POA: Diagnosis not present

## 2022-04-23 DIAGNOSIS — Z79899 Other long term (current) drug therapy: Secondary | ICD-10-CM | POA: Diagnosis not present

## 2022-04-23 DIAGNOSIS — M47819 Spondylosis without myelopathy or radiculopathy, site unspecified: Secondary | ICD-10-CM | POA: Diagnosis not present

## 2022-04-24 ENCOUNTER — Ambulatory Visit: Payer: Commercial Managed Care - PPO | Admitting: Family Medicine

## 2022-04-24 VITALS — BP 122/86 | HR 72 | Temp 98.3°F | Ht 65.0 in | Wt 183.0 lb

## 2022-04-24 DIAGNOSIS — J029 Acute pharyngitis, unspecified: Secondary | ICD-10-CM

## 2022-04-24 LAB — POCT RAPID STREP A (OFFICE): Rapid Strep A Screen: NEGATIVE

## 2022-04-24 NOTE — Patient Instructions (Addendum)
Rapid strep negative.  Awaiting culture.  Lots of fluids. Tylenol as needed.

## 2022-04-24 NOTE — Assessment & Plan Note (Signed)
Rapid strep negative. Awaiting culture. Tylenol as needed. Supportive care.

## 2022-04-24 NOTE — Progress Notes (Signed)
Subjective:  Patient ID: Ruth Robertson, female    DOB: 12/21/1965  Age: 58 y.o. MRN: [id number]  CC: Chief Complaint  Patient presents with   exposure for strep    Throat sore past 2 days, headache    HPI:  57 year old female presents for evaluation of the above.  Patient reports that sore throat started started last night. Recently exposed to strep (granddaughter tested positive). No fever. Associated headache. No other reported symptoms. No other complaints.  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/02/2021   Bradycardia 03/04/2021   Acute lower UTI 02/27/2021   Endometriosis 02/23/2021   History of cervical dysplasia 02/23/2021   Asthma 12/29/2020   Personal history of pneumonia (recurrent) 07/21/2020   Microscopic hematuria 12/03/2017   Lesion of liver 07/17/2017   Tobacco abuse 06/19/2017   Depression with anxiety 03/26/2011    Social Hx   Social History   Socioeconomic History   Marital status: Divorced    Spouse name: Not on file   Number of children: Not on file   Years of education: Not on file   Highest education level: Not on file  Occupational History   Not on file  Tobacco Use   Smoking status: Every Day    Packs/day: 1.00    Years: 25.00    Additional pack years: 0.00    Total pack years: 25.00    Types: Cigarettes   Smokeless tobacco: Never  Vaping Use   Vaping Use: Never used  Substance and Sexual Activity   Alcohol use: Not Currently    Alcohol/week: 0.0 standard drinks of alcohol    Comment: rare   Drug use: No   Sexual activity: Yes    Partners: Male    Comment: HYST-1st intercourse 48 yo-5 partners  Other Topics Concern   Not on file  Social History Narrative   Not on file   Social Determinants of Health   Financial Resource Strain: Not on file  Food Insecurity: Not on file  Transportation Needs: Not on file  Physical Activity: Not on file   Stress: Not on file  Social Connections: Not on file    Review of Systems Per HPI  Objective:  BP 122/86   Pulse 72   Temp 98.3 F (36.8 C)   Ht 5\' 5"  (1.651 m)   Wt 183 lb (83 kg)   LMP 06/12/2012   SpO2 99%   BMI 30.45 kg/m      04/24/2022    4:23 PM 03/21/2022   11:33 AM 03/19/2022    3:40 PM  BP/Weight  Systolic BP 123XX123 [id number] [id number]  Diastolic BP 86 83 68  Wt. (Lbs) 183 182.8 182  BMI 30.45 kg/m2 30.42 kg/m2 30.29 kg/m2    Physical Exam Constitutional:      Appearance: Normal appearance.  HENT:     Head: Normocephalic and atraumatic.     Mouth/Throat:     Pharynx: Posterior oropharyngeal erythema present. No oropharyngeal exudate.  Cardiovascular:     Rate and Rhythm: Normal rate and regular rhythm.  Pulmonary:     Effort: Pulmonary effort is normal.     Breath sounds: Normal breath sounds. No wheezing or rales.  Lymphadenopathy:     Cervical: No cervical adenopathy.  Neurological:     Mental Status: She is alert.     Lab Results  Component Value Date   WBC 6.9 04/24/2021   HGB  15.1 04/24/2021   HCT 46.0 04/24/2021   PLT 294 04/24/2021   GLUCOSE 98 04/24/2021   CHOL 162 02/18/2020   TRIG 103 02/18/2020   HDL 42 (L) 02/18/2020   LDLCALC 100 (H) 02/18/2020   ALT 32 04/24/2021   AST 21 04/24/2021   NA 142 04/24/2021   K 4.8 04/24/2021   CL 106 04/24/2021   CREATININE 0.90 04/24/2021   BUN 14 04/24/2021   CO2 22 04/24/2021   TSH 0.88 02/18/2020     Assessment & Plan:   Problem List Items Addressed This Visit       Other   Sore throat - Primary    Rapid strep negative. Awaiting culture. Tylenol as needed. Supportive care.       Relevant Orders   Culture, Group A Strep   POCT Rapid Strep A   POCT rapid strep A (Completed)   Makynzi Eastland Lacinda Axon DO [place]

## 2022-04-26 ENCOUNTER — Other Ambulatory Visit (HOSPITAL_COMMUNITY)
Admission: RE | Admit: 2022-04-26 | Discharge: 2022-04-26 | Disposition: A | Discharge status: Home / Self Care | Payer: Commercial Managed Care - PPO | Source: Ambulatory Visit | Attending: Obstetrics and Gynecology | Admitting: Obstetrics and Gynecology

## 2022-04-26 ENCOUNTER — Ambulatory Visit (INDEPENDENT_AMBULATORY_CARE_PROVIDER_SITE_OTHER): Payer: Commercial Managed Care - PPO | Admitting: Obstetrics and Gynecology

## 2022-04-26 ENCOUNTER — Encounter: Payer: Self-pay | Admitting: Obstetrics and Gynecology

## 2022-04-26 VITALS — BP 116/74 | HR 76 | Ht 65.0 in | Wt 184.0 lb

## 2022-04-26 DIAGNOSIS — Z87411 Personal history of vaginal dysplasia: Secondary | ICD-10-CM | POA: Diagnosis not present

## 2022-04-26 DIAGNOSIS — R87620 Atypical squamous cells of undetermined significance on cytologic smear of vagina (ASC-US): Secondary | ICD-10-CM

## 2022-04-26 DIAGNOSIS — R87811 Vaginal high risk human papillomavirus (HPV) DNA test positive: Secondary | ICD-10-CM | POA: Insufficient documentation

## 2022-04-26 NOTE — Patient Instructions (Signed)

## 2022-04-28 LAB — SPECIMEN STATUS REPORT

## 2022-04-28 LAB — CULTURE, GROUP A STREP: Strep A Culture: NEGATIVE

## 2022-05-02 LAB — SURGICAL PATHOLOGY

## 2022-05-15 ENCOUNTER — Other Ambulatory Visit (HOSPITAL_COMMUNITY): Payer: Self-pay

## 2022-05-16 ENCOUNTER — Other Ambulatory Visit: Payer: Self-pay

## 2022-05-16 ENCOUNTER — Other Ambulatory Visit (HOSPITAL_COMMUNITY): Payer: Self-pay

## 2022-08-03 ENCOUNTER — Encounter (HOSPITAL_COMMUNITY): Payer: Self-pay

## 2022-08-03 ENCOUNTER — Other Ambulatory Visit: Payer: Self-pay

## 2022-08-03 ENCOUNTER — Emergency Department (HOSPITAL_COMMUNITY)
Admission: EM | Admit: 2022-08-03 | Discharge: 2022-08-03 | Disposition: A | Discharge status: Home / Self Care | Payer: Commercial Managed Care - PPO | Attending: Emergency Medicine | Admitting: Emergency Medicine

## 2022-08-03 ENCOUNTER — Emergency Department (HOSPITAL_COMMUNITY): Payer: Commercial Managed Care - PPO

## 2022-08-03 DIAGNOSIS — M25561 Pain in right knee: Secondary | ICD-10-CM | POA: Insufficient documentation

## 2022-08-03 DIAGNOSIS — S82091A Other fracture of right patella, initial encounter for closed fracture: Secondary | ICD-10-CM | POA: Diagnosis not present

## 2022-08-03 NOTE — ED Triage Notes (Signed)
Pt ambulatory to triage with c/o right knee pain that started this morning, pt denies injury. No obvious swelling or bruising, not warm to touch or red.

## 2022-08-03 NOTE — ED Provider Notes (Signed)
[facility] EMERGENCY DEPARTMENT AT [facility] Provider Note   CSN: [id number] Arrival date & time: 08/03/22  1932     History  Chief Complaint  Patient presents with   Knee Pain    Ruth Robertson is a 57 y.o. female history of seronegative spondyloarthropathy presenting with right knee pain that began this morning the patient woke up.  Patient states she has been on the beach the past week and has been walking a lot however stated that when she woke up this morning her right knee was in a lot of pain.  Patient denies any recent illnesses, knee swelling, knee redness, changes in sensation, weakness but does state that hurts to bear weight or move her right knee.  Patient states that pain is on the inside of her right knee but is unable to describe if it is sharp or dull.  Patient has tried Tylenol 9 and half hours ago and states it did not help.  Patient does note that she has history of arthritis in this knee.  Patient denied fevers, chills, nausea/vomiting, recent knee surgery, chest pain, shortness of breath  Home Medications Prior to Admission medications   Medication Sig Start Date End Date Taking? Authorizing Provider  acetaminophen (TYLENOL) 325 MG tablet Take 2 tablets (650 mg total) by mouth every 6 (six) hours as needed for mild pain (or Fever >/= 101). 06/20/17   Philip Aspen, Limmie Patricia, MD  ondansetron (ZOFRAN) 8 MG tablet Take 1 tablet (8 mg total) by mouth every 8 (eight) hours as needed for nausea. Patient not taking: Reported on 04/26/2022 01/24/21   Babs Sciara, MD  sulfaSALAzine (AZULFIDINE) 500 MG EC tablet Take 2 tablets (1,000 mg total) by mouth 2 (two) times daily with food 07/05/21     sulfaSALAzine (AZULFIDINE) 500 MG EC tablet Take 2 tablets (1,000 mg total) by mouth 2 (two) times daily. Take with food 12/13/21     sulfaSALAzine (AZULFIDINE) 500 MG EC tablet Take 2 tablets (1,000 mg total) by mouth 2 (two) times daily with food. 01/16/22          Allergies    Patient has no known allergies.    Review of Systems   Review of Systems See HPI Physical Exam Updated Vital Signs BP (!) 135/92   Pulse 78   Temp 98.9 F (37.2 C) (Oral)   Resp 18   Ht 5\' 5"  (1.651 m)   Wt 83.5 kg   LMP 06/12/2012   SpO2 94%   BMI 30.62 kg/m  Physical Exam Constitutional:      General: She is not in acute distress. Cardiovascular:     Pulses: Normal pulses.  Musculoskeletal:        General: No swelling, deformity or signs of injury.     Comments: Right knee: No overlying skin color changes, not edematous, tender to palpation on the medial tibia near the plateau, minimal range of motion due to pain, negative anterior/posterior drawer test, varus/valgus stress test, positive McMurray's test, knee is not warm to the touch Able to range right ankle without pain No calf tenderness Soft compartments  Skin:    General: Skin is warm and dry.     Capillary Refill: Capillary refill takes less than 2 seconds.     Findings: No erythema.  Neurological:     Mental Status: She is alert.     Comments: Sensation intact distally     ED Results / Procedures / Treatments   Labs (  all labs ordered are listed, but only abnormal results are displayed) Labs Reviewed - No data to display  EKG None  Radiology DG Knee Complete 4 Views Right  Result Date: 08/03/2022 CLINICAL DATA:  Knee pain EXAM: RIGHT KNEE - COMPLETE 4+ VIEW COMPARISON:  None Available. FINDINGS: No fracture or dislocation is seen. The joint spaces are preserved. Visualized soft tissues are within normal limits. No suprapatellar knee joint effusion. IMPRESSION: Negative. Electronically Signed   By: Charline Bills M.D.   On: 08/03/2022 20:19    Procedures Procedures    Medications Ordered in ED Medications - No data to display  ED Course/ Medical Decision Making/ A&P                             Medical Decision Making Amount and/or Complexity of Data Reviewed Radiology:  ordered.   Ruth Robertson 57 y.o. presented today for right knee pain. Working DDx that I considered at this time includes, but not limited to, meniscal injury, septic arthritis, DVT, strain/sprain, fracture, dislocation, neurovascular compromise, ischemic limb, compartment syndrome.  R/o DDx: septic arthritis, DVT, strain/sprain, fracture, dislocation, neurovascular compromise, ischemic limb, compartment syndrome: These are considered less likely due to history of present illness and physical exam findings  Review of prior external notes: 04/26/2022 outpatient visit  Unique Tests and My Interpretation:  Left knee x-ray: No acute changes  Discussion with Independent Historian: None  Discussion of Management of Tests: None  Risk: Low: based on diagnostic testing/clinical impression and treatment plan  Risk Stratification Score: None  Plan: On exam patient was in no acute distress with stable vitals.  Patient's physical exam did show the patient was tender on the medial aspect for tibia near the plateau and patient did have positive McMurray's test suspicious of meniscal tear.  There were no signs of septic arthritis or neurovascular, eyes or any other life-threatening diagnoses at this time.  I spoke to the patient and patient stated that she did not need crutches but would want a knee brace and that she can take Tylenol at home does not need pain meds in the ER.  Patient is that she will either follow-up with the orthopedic urgent care in [facility] or the orthopedist I will refer her to.  I encouraged patient use Tylenol every 6 hours as needed for pain.  Patient was given return precautions. Patient stable for discharge at this time.  Patient verbalized understanding of plan.         Final Clinical Impression(s) / ED Diagnoses Final diagnoses:  Acute pain of right knee    Rx / DC Orders ED Discharge Orders     None         Remi Deter 08/03/22  2137    Jacalyn Lefevre, MD 08/04/22 1530

## 2022-08-03 NOTE — Discharge Instructions (Addendum)
Please follow-up with orthopedist I have attached your for you regarding recent symptoms and ER visit.  You may have a meniscus injury which will need an MRI for further evaluation.  You may take Tylenol every 6 hours needed for pain.  Please keep the knee brace on until you are able to see the orthopedist.  You may ice your knee as well.  If symptoms change or worsen please return to ER.

## 2022-08-05 ENCOUNTER — Telehealth: Payer: Self-pay

## 2022-08-05 ENCOUNTER — Emergency Department (HOSPITAL_COMMUNITY): Payer: Commercial Managed Care - PPO

## 2022-08-05 ENCOUNTER — Emergency Department (HOSPITAL_COMMUNITY)
Admission: EM | Admit: 2022-08-05 | Discharge: 2022-08-06 | Disposition: A | Discharge status: Home / Self Care | Payer: Commercial Managed Care - PPO | Attending: Emergency Medicine | Admitting: Emergency Medicine

## 2022-08-05 ENCOUNTER — Encounter (HOSPITAL_COMMUNITY): Payer: Self-pay

## 2022-08-05 ENCOUNTER — Other Ambulatory Visit: Payer: Self-pay

## 2022-08-05 DIAGNOSIS — M25561 Pain in right knee: Secondary | ICD-10-CM | POA: Diagnosis not present

## 2022-08-05 DIAGNOSIS — M25461 Effusion, right knee: Secondary | ICD-10-CM | POA: Diagnosis not present

## 2022-08-05 DIAGNOSIS — R509 Fever, unspecified: Secondary | ICD-10-CM | POA: Diagnosis not present

## 2022-08-05 DIAGNOSIS — R7309 Other abnormal glucose: Secondary | ICD-10-CM | POA: Insufficient documentation

## 2022-08-05 LAB — CBC WITH DIFFERENTIAL/PLATELET
Abs Immature Granulocytes: 0.03 10*3/uL (ref 0.00–0.07)
Basophils Absolute: 0 10*3/uL (ref 0.0–0.1)
Basophils Relative: 0 %
Eosinophils Absolute: 0.2 10*3/uL (ref 0.0–0.5)
Eosinophils Relative: 2 %
HCT: 45 % (ref 36.0–46.0)
Hemoglobin: 14.2 g/dL (ref 12.0–15.0)
Immature Granulocytes: 0 %
Lymphocytes Relative: 20 %
Lymphs Abs: 2 10*3/uL (ref 0.7–4.0)
MCH: 30.4 pg (ref 26.0–34.0)
MCHC: 31.6 g/dL (ref 30.0–36.0)
MCV: 96.4 fL (ref 80.0–100.0)
Monocytes Absolute: 0.9 10*3/uL (ref 0.1–1.0)
Monocytes Relative: 9 %
Neutro Abs: 7.2 10*3/uL (ref 1.7–7.7)
Neutrophils Relative %: 69 %
Platelets: 221 10*3/uL (ref 150–400)
RBC: 4.67 MIL/uL (ref 3.87–5.11)
RDW: 13.3 % (ref 11.5–15.5)
WBC: 10.3 10*3/uL (ref 4.0–10.5)
nRBC: 0 % (ref 0.0–0.2)

## 2022-08-05 LAB — COMPREHENSIVE METABOLIC PANEL
ALT: 18 U/L (ref 0–44)
AST: 16 U/L (ref 15–41)
Albumin: 3.4 g/dL — ABNORMAL LOW (ref 3.5–5.0)
Alkaline Phosphatase: 54 U/L (ref 38–126)
Anion gap: 9 (ref 5–15)
BUN: 11 mg/dL (ref 6–20)
CO2: 23 mmol/L (ref 22–32)
Calcium: 8.8 mg/dL — ABNORMAL LOW (ref 8.9–10.3)
Chloride: 109 mmol/L (ref 98–111)
Creatinine, Ser: 1.01 mg/dL — ABNORMAL HIGH (ref 0.44–1.00)
GFR, Estimated: >60 mL/min (ref >60)
Glucose, Bld: 111 mg/dL — ABNORMAL HIGH (ref 70–99)
Potassium: 3.7 mmol/L (ref 3.5–5.1)
Sodium: 141 mmol/L (ref 135–145)
Total Bilirubin: 0.4 mg/dL (ref 0.3–1.2)
Total Protein: 6.5 g/dL (ref 6.5–8.1)

## 2022-08-05 LAB — D-DIMER, QUANTITATIVE: D-Dimer, Quant: 3.57 ug{FEU}/mL — ABNORMAL HIGH (ref 0.00–0.50)

## 2022-08-05 NOTE — Transitions of Care (Post Inpatient/ED Visit) (Signed)
   08/05/2022  Name: Ruth Robertson MRN: [id number] DOB: December 04, 1965  Today's TOC FU Call Status: Today's TOC FU Call Status:: Successful TOC FU Call Competed TOC FU Call Complete Date: 08/05/22  Transition Care Management Follow-up Telephone Call Date of Discharge: 08/03/22 [facility] Type of Discharge: Emergency Department Reason for ED Visit: Other: (right knee pain) How have you been since you were released from the hospital?: Same Any questions or concerns?: No  Items Reviewed: Did you receive and understand the discharge instructions provided?: No Medications obtained,verified, and reconciled?: Yes (Medications Reviewed) Any new allergies since your discharge?: No Dietary orders reviewed?: Yes Do you have support at home?: Yes People in Home: spouse  Medications Reviewed Today: Medications Reviewed Today     Reviewed by Karena Addison, LPN (Licensed Practical Nurse) on 08/05/22 at 1537  Med List Status: <None>   Medication Order Taking? Sig Documenting Provider Last Dose Status Informant  acetaminophen (TYLENOL) 325 MG tablet [id number] No Take 2 tablets (650 mg total) by mouth every 6 (six) hours as needed for mild pain (or Fever >/= 101). Philip Aspen, Limmie Patricia, MD Taking Active Self  ondansetron [facility]) 8 MG tablet [id number] No Take 1 tablet (8 mg total) by mouth every 8 (eight) hours as needed for nausea.  Patient not taking: Reported on 04/26/2022   Babs Sciara, MD Not Taking Active Self  sulfaSALAzine (AZULFIDINE) 500 MG EC tablet [id number] No Take 2 tablets (1,000 mg total) by mouth 2 (two) times daily with food  Taking Active   sulfaSALAzine (AZULFIDINE) 500 MG EC tablet [id number] No Take 2 tablets (1,000 mg total) by mouth 2 (two) times daily. Take with food  Taking Active   sulfaSALAzine (AZULFIDINE) 500 MG EC tablet [id number] No Take 2 tablets (1,000 mg total) by mouth 2 (two) times daily with food.  Taking Active              Home Care and Equipment/Supplies: Were Home Health Services Ordered?: NA Any new equipment or medical supplies ordered?: NA  Functional Questionnaire: Do you need assistance with bathing/showering or dressing?: No Do you need assistance with meal preparation?: No Do you need assistance with eating?: No Do you have difficulty maintaining continence: No Do you need assistance with getting out of bed/getting out of a chair/moving?: No  Follow up appointments reviewed: PCP Follow-up appointment confirmed?: NA Specialist Hospital Follow-up appointment confirmed?: Yes Date of Specialist follow-up appointment?: 08/05/22 Follow-Up Specialty Provider:: ortho Do you need transportation to your follow-up appointment?: No Do you understand care options if your condition(s) worsen?: Yes-patient verbalized understanding    SIGNATURE Karena Addison, LPN [facility] Nurse Health Advisor Direct Dial [phone]

## 2022-08-05 NOTE — ED Triage Notes (Signed)
Pt arrived form home via POV c/o right knee pain that began on Sat.  Pt was seen at [facility], then followed up with ortho. Pt has MRI scheduled. Pt stated that this evening her temp went up to 99.4 and now is not able to put any weight on right leg, pt called ortho MD and was told to come to ED for DVT work up. Right knee noted to be swollen, red, and warm to touch. Pedal pulse intact at present.

## 2022-08-06 ENCOUNTER — Ambulatory Visit (HOSPITAL_COMMUNITY): Admission: RE | Admit: 2022-08-06 | Payer: Commercial Managed Care - PPO | Source: Ambulatory Visit

## 2022-08-06 ENCOUNTER — Telehealth: Payer: Self-pay

## 2022-08-06 DIAGNOSIS — M25462 Effusion, left knee: Secondary | ICD-10-CM | POA: Diagnosis not present

## 2022-08-06 LAB — CBG MONITORING, ED: Glucose-Capillary: 107 mg/dL — ABNORMAL HIGH (ref 70–99)

## 2022-08-06 MED ORDER — LIDOCAINE HCL (PF) 1 % IJ SOLN
30.0000 mL | Freq: Once | INTRAMUSCULAR | Status: AC
  Administered 2022-08-06: 30 mL
  Filled 2022-08-06: qty 30

## 2022-08-06 NOTE — Discharge Instructions (Signed)
Please follow-up with [facility] for your knee pain.  We were unable to obtain and adequate fluid sample to test.  If you have worsening pain, swelling, redness, or fever, please return to the ER.

## 2022-08-06 NOTE — ED Provider Notes (Signed)
[facility] Emergency Department Provider Note MRN:  [id number]  Arrival date & time: 08/06/22     Chief Complaint   Knee Pain   History of Present Illness   Ruth SCHNELLE is a 57 y.o. year-old female presents to the ED with chief complaint of right knee pain.  She states that she was at the beach on Friday and woke up Saturday morning and couldn't walk.  She has had worsening pain in the right knee since then.  States that she was seen at [facility] and has an MRI scheduled, but then developed a low grade fever to 99.4 and was referred to the ER.  She denies any trauma.  Denies hx of gout.  Denies hx of DVT.  History provided by patient.   Review of Systems  Pertinent positive and negative review of systems noted in HPI.    Physical Exam   Vitals:   08/05/22 2209 08/06/22 0134  BP:  121/89  Pulse:  69  Resp:  18  Temp:  97.9 F (36.6 C)  SpO2: 96% 99%    CONSTITUTIONAL:  well-appearing, NAD NEURO:  Alert and oriented x 3, CN 3-12 grossly intact EYES:  eyes equal and reactive ENT/NECK:  Supple, no stridor  CARDIO:  normal rate, regular rhythm, appears well-perfused  PULM:  No respiratory distress, CTAB GI/GU:  non-distended,  MSK/SPINE:  Mild swelling about the right knee, has more medial pain.  There is some mild erythema medially, suspect small effusion, ROM limited by pain. SKIN:  no rash, atraumatic   *Additional and/or pertinent findings included in MDM below  Diagnostic and Interventional Summary    EKG Interpretation Date/Time:    Ventricular Rate:    PR Interval:    QRS Duration:    QT Interval:    QTC Calculation:   R Axis:      Text Interpretation:         Labs Reviewed  COMPREHENSIVE METABOLIC PANEL - Abnormal; Notable for the following components:      Result Value   Glucose, Bld 111 (*)    Creatinine, Ser 1.01 (*)    Calcium 8.8 (*)    Albumin 3.4 (*)    All other components within normal limits   D-DIMER, QUANTITATIVE - Abnormal; Notable for the following components:   D-Dimer, Quant 3.57 (*)    All other components within normal limits  CBG MONITORING, ED - Abnormal; Notable for the following components:   Glucose-Capillary 107 (*)    All other components within normal limits  CBC WITH DIFFERENTIAL/PLATELET    DG Knee Complete 4 Views Right  Final Result      Medications  lidocaine (PF) (XYLOCAINE) 1 % injection 30 mL (30 mLs Infiltration Given 08/06/22 0204)     Procedures  /  Critical Care .Joint Aspiration/Arthrocentesis  Date/Time: 08/06/2022 2:34 AM  Performed by: Roxy Horseman, PA-C Authorized by: Roxy Horseman, PA-C   Consent:    Consent obtained:  Written   Consent given by:  Patient   Risks discussed:  Bleeding, infection, pain, nerve damage and incomplete drainage   Alternatives discussed:  Delayed treatment and referral Universal protocol:    Procedure explained and questions answered to patient or proxy's satisfaction: yes     Relevant documents present and verified: yes     Test results available: yes     Imaging studies available: yes     Required blood products, implants, devices, and special equipment available: yes  Site/side marked: yes     Immediately prior to procedure, a time out was called: yes     Patient identity confirmed:  Verbally with patient Location:    Location:  Knee   Knee:  R knee Anesthesia:    Anesthesia method:  Local infiltration   Local anesthetic:  Lidocaine 1% w/o epi Procedure details:    Preparation: Patient was prepped and draped in usual sterile fashion     Needle gauge:  18 G   Approach:  Lateral   Aspirate amount:  0   Steroid injected: no     Specimen collected: no   Post-procedure details:    Procedure completion:  Procedure terminated electively by provider Comments:     I was unable to successfully obtain joint fluid, patient was experiencing pain and the procedure was terminated for patient  comfort.   ED Course and Medical Decision Making  I have reviewed the triage vital signs, the nursing notes, and pertinent available records from the EMR.  Social Determinants Affecting Complexity of Care: Patient has no clinically significant social determinants affecting this chief complaint..   ED Course:    Medical Decision Making Patient here with worsening right knee pain over the past few days.  She denies any injuries.  She has increased pain with ROM and walking.  She has some mild swelling.  Has seen ortho and has MRI ordered for Friday.  She has some mild swelling on exam and a small effusion seen on x-ray.  We discussed that she might need aspiration to rule out septic joint, but overall, this is thought less likely with normal vitals here, no significant erythema, and only somewhat limited ROM.  Orthopedics was concerned for DVT.  I think less likely to be DVT, no calf tenderness.  She doesn't have any chest pain or SOB.  Dr. Jacqulyn Bath agrees that PE/DVT are less likely.  D-dimer is elevated, but we believe this to be non-specific in this setting.  I will order an outpatient [place] since [place] is not here in the middle of the night.  I have lower concern for cellulitis or abscess.  Gout is considered and it would be helpful to get a fluid sample.  I did attempt to get joint fluid for analysis, but was unable to obtain the sample.  Procedure ultimately terminated due to pain and patient tolerance.  Amount and/or Complexity of Data Reviewed Labs: ordered.    Details: No leukocytosis, making septic arthritis less likely D-dimer is elevated, but no palpable cords, calf tenderness, thigh tenderness, CP or SOB.  DVT/PE thought less likely. Radiology: ordered and independent interpretation performed.    Details: Small effusion seen  Risk Prescription drug management.         Consultants: No consultations were needed in caring for this patient.   Treatment and Plan: I considered  admission due to patient's initial presentation, but after considering the examination and diagnostic results, patient will not require admission and can be discharged with outpatient follow-up.  Patient seen by and discussed with attending physician, Dr. Jacqulyn Bath, who agrees with plan..  Final Clinical Impressions(s) / ED Diagnoses     ICD-10-CM   1. Acute pain of right knee  M25.561       ED Discharge Orders     None         Discharge Instructions Discussed with and Provided to Patient:     Discharge Instructions      Please follow-up with [facility] for your  knee pain.  We were unable to obtain and adequate fluid sample to test.  If you have worsening pain, swelling, redness, or fever, please return to the ER.       Roxy Horseman, PA-C 08/06/22 1610    Maia Plan, MD 08/06/22 [phone]

## 2022-08-06 NOTE — Telephone Encounter (Signed)
Error. Call already completed

## 2022-08-07 ENCOUNTER — Other Ambulatory Visit (HOSPITAL_COMMUNITY): Payer: Self-pay

## 2022-08-07 ENCOUNTER — Ambulatory Visit (HOSPITAL_BASED_OUTPATIENT_CLINIC_OR_DEPARTMENT_OTHER)
Admission: RE | Admit: 2022-08-07 | Discharge: 2022-08-07 | Disposition: A | Discharge status: Home / Self Care | Payer: Commercial Managed Care - PPO | Source: Ambulatory Visit | Attending: Surgery | Admitting: Surgery

## 2022-08-07 ENCOUNTER — Encounter (HOSPITAL_COMMUNITY): Payer: Self-pay

## 2022-08-07 ENCOUNTER — Ambulatory Visit (HOSPITAL_COMMUNITY)
Admission: RE | Admit: 2022-08-07 | Discharge: 2022-08-07 | Disposition: A | Discharge status: Home / Self Care | Payer: Commercial Managed Care - PPO | Source: Ambulatory Visit | Attending: Emergency Medicine | Admitting: Emergency Medicine

## 2022-08-07 VITALS — BP 110/86 | HR 74

## 2022-08-07 DIAGNOSIS — M7989 Other specified soft tissue disorders: Secondary | ICD-10-CM | POA: Diagnosis not present

## 2022-08-07 DIAGNOSIS — I82451 Acute embolism and thrombosis of right peroneal vein: Secondary | ICD-10-CM | POA: Diagnosis not present

## 2022-08-07 MED ORDER — APIXABAN 5 MG PO TABS
5.0000 mg | ORAL_TABLET | Freq: Two times a day (BID) | ORAL | 5 refills | Status: DC
  Filled 2022-08-07: qty 60, 30d supply, fill #0

## 2022-08-07 MED ORDER — APIXABAN (ELIQUIS) VTE STARTER PACK (10MG AND 5MG)
ORAL_TABLET | ORAL | 0 refills | Status: DC
  Filled 2022-08-07: qty 74, 30d supply, fill #0

## 2022-08-07 NOTE — Progress Notes (Signed)
Right lower extremity venous study completed.   Please see CV Procedures for preliminary results.  Azaleah Usman, RVT  11:20 AM 08/07/22

## 2022-08-07 NOTE — Patient Instructions (Signed)
-  Start apixaban (Eliquis) 10 mg twice daily for 7 days followed by 5 mg twice daily. -Your refills have been sent to [facility]. You may need to call the pharmacy to ask them to fill this when you start to run low on your current supply.  -I am referring you to hematology. You'll be getting a call from them to schedule an appointment with them.  -It is important to take your medication around the same time every day.  -Avoid NSAIDs like ibuprofen (Advil, Motrin) and naproxen (Aleve) as well as aspirin doses over 100 mg daily. -Tylenol (acetaminophen) is the preferred over the counter pain medication to lower the risk of bleeding. -Be sure to alert all of your health care providers that you are taking an anticoagulant prior to starting a new medication or having a procedure. -Monitor for signs and symptoms of bleeding (abnormal bruising, prolonged bleeding, nose bleeds, bleeding from gums, discolored urine, black tarry stools). If you have fallen and hit your head OR if your bleeding is severe or not stopping, seek emergency care.  -Go to the emergency room if emergent signs and symptoms of new clot occur (new or worse swelling and pain in an arm or leg, shortness of breath, chest pain, fast or irregular heartbeats, lightheadedness, dizziness, fainting, coughing up blood) or if you experience a significant color change (pale or blue) in the extremity that has the DVT.  -We recommend you wear compression stockings (20-30 mmHg) as long as you are having swelling or pain. Be sure to purchase the correct size and take them off at night.   Your next visit is on August 22nd at [facility].  [facility] DVT Clinic [address] [place], [place], [place] [address] Enter the hospital through Entrance C off [facility] and pull up to the Heart [facility] Vascular Center entrance to the free valet parking.  Check in for your appointment at the Heart & Vascular Center.   If you have any questions or need to reschedule  an appointment, please call [phone] [facility].  If you are having an emergency, call 911 or present to the nearest emergency room.   What is a DVT?  -Deep vein thrombosis (DVT) is a condition in which a blood clot forms in a vein of the deep venous system which can occur in the lower leg, thigh, pelvis, arm, or neck. This condition is serious and can be life-threatening if the clot travels to the arteries of the lungs and causing a blockage (pulmonary embolism, PE). A DVT can also damage veins in the leg, which can lead to long-term venous disease, leg pain, swelling, discoloration, and ulcers or sores (post-thrombotic syndrome).  -Treatment may include taking an anticoagulant medication to prevent more clots from forming and the current clot from growing, wearing compression stockings, and/or surgical procedures to remove or dissolve the clot.

## 2022-08-07 NOTE — Progress Notes (Addendum)
DVT Clinic Note  Name: Ruth Robertson     MRN: [id number]     DOB: 05-Nov-1965     Sex: female  PCP: Babs Sciara, MD  Today's Visit: Visit Information: Initial Visit  Referred to DVT Clinic by: Roxy Horseman, PA-C [facility] ED)  Referred to CPP by: Dr. Myra Gianotti Reason for referral:  Chief Complaint  Patient presents with   DVT   HISTORY OF PRESENT ILLNESS: Ruth Robertson is a 57 y.o. 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 Patient presented to the ED 08/03/22 and again 08/05/22 for acute right knee pain. Pain began the day after she returned from the beach. She drove to [facility] on 07/28/22, returning on 08/02/22. This was a four hour drive and she was the driver. She was active while at the beach. On 08/03/22 it became painful to walk on her right foot and her pain has continued to increase since then. 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 She has no history of DVT. She has a condo in [place] and used to drive to [place] and back nearly every weekend. She has had no recent arthritic or GI flares. No recent injuries. She currently smokes less than 1 ppd. LMP was in 2014. She reports being up to date on cancer screenings.   Positive Thrombotic Risk Factors: Smoking, Other (comment) (Recent 4 hour car ride prior to symptom onset) Bleeding Risk Factors: None Present  Negative Thrombotic Risk Factors: Previous VTE, Recent surgery (within 3 months), Recent trauma (within 3 months), Recent admission to hospital with acute illness (within 3 months), Paralysis, paresis, or recent plaster cast immobilization of lower extremity, Central venous catheterization, Pregnancy, Testosterone therapy, Estrogen therapy, Sedentary  journey lasting >8 hours within 4 weeks, Recent cesarean section (within 3 months), Bed rest >72 hours within 3 months, Within 6 weeks postpartum, Erythropoiesis-stimulating agent, Recent COVID diagnosis (within 3 months), Older age, Obesity, Active cancer, Known thrombophilic condition  Rx Insurance Coverage: Commercial Rx Affordability: Eliquis is $110/month. Filled today using one time free card. Provided with coupon to reduce refill cost to $10/month.  Preferred Pharmacy: Filled starter pack during visit today at [facility]. Refills sent to [facility]   Past Medical History:  Diagnosis Date   Anxiety    Asthma    ONLY WITH BRONCHITIS   GAD (generalized anxiety disorder)    GERD (gastroesophageal reflux disease)    Microscopic hematuria 12/03/2017   Negative cystoscopy negative CAT scan   Pneumonia    PONV (postoperative nausea and vomiting)     Past Surgical History:  Procedure Laterality Date   AGILE CAPSULE N/A 07/09/2016   Procedure: AGILE CAPSULE;  Surgeon: West Bali, MD;  [facility];  Service: Endoscopy;  Laterality: N/A;  7:30am, pt to arrive at 7:00am   BIOPSY  09/19/2015   Procedure: BIOPSY;  Surgeon: West Bali, MD;  Location: [facility];  Service: Endoscopy;;  random colon bx's   COLONOSCOPY WITH PROPOFOL N/A 09/19/2015   Dr. Darrick Penna: five 2-3 mm polyps in rectum (hyperplastic) and descending colon, diverticulosis in sigmoid colon and transverse colon. Query post-infectious IBS. Next colonoscopy in 10 years    CYST EXCISION N/A 01/20/2019   Procedure: EXCISION OF RIGHT GROIN SEBACEOUS CYST;  Surgeon:  Harriette Bouillon, MD;  Location: [facility];  Service: General;  Laterality: N/A;   GIVENS CAPSULE STUDY N/A 07/30/2016   Procedure: GIVENS CAPSULE STUDY;  Surgeon: West Bali, MD;  [facility]: [facility];  Service: Endoscopy;  Laterality: N/A;   HYSTEROSCOPY  2006   POLYPECTOMY  09/19/2015   Procedure:  POLYPECTOMY;  Surgeon: West Bali, MD;  Location: [facility];  Service: Endoscopy;;  descending colon polyps x3 cold bx, rectal polyp x2   SALPINGOOPHORECTOMY Bilateral 08/18/2012   Procedure: SALPINGO OOPHORECTOMY;  Surgeon: Dara Lords, MD;  Location: [facility];  Service: Gynecology;  Laterality: Bilateral;   VAGINAL HYSTERECTOMY N/A 08/18/2012   Procedure: HYSTERECTOMY VAGINAL;  Surgeon: Dara Lords, MD;  Location: [facility];  Service: Gynecology;  Laterality: N/A;    Social History   Socioeconomic History   Marital status: Divorced    Spouse name: Not on file   Number of children: Not on file   Years of education: Not on file   Highest education level: Not on file  Occupational History   Not on file  Tobacco Use   Smoking status: Every Day    Packs/day: 1.00    Years: 25.00    Additional pack years: 0.00    Total pack years: 25.00    Types: Cigarettes   Smokeless tobacco: Never  Vaping Use   Vaping Use: Never used  Substance and Sexual Activity   Alcohol use: Not Currently    Alcohol/week: 0.0 standard drinks of alcohol    Comment: rare   Drug use: No   Sexual activity: Not Currently    Partners: Male    Comment: HYST-1st intercourse 26 yo-5 partners  Other Topics Concern   Not on file  Social History Narrative   Not on file   Social Determinants of Health   Financial Resource Strain: Not on file  Food Insecurity: Not on file  Transportation Needs: Not on file  Physical Activity: Not on file  Stress: Not on file  Social Connections: Not on file  Intimate Partner Violence: Not on file    Family History  Problem Relation Age of Onset   Heart attack Mother    Heart failure Father    Stroke Father    Hypertension Maternal Grandmother    Diabetes Paternal Grandfather    Breast cancer Maternal Aunt        50's   Colon cancer Maternal Aunt        late 2s or 70s   Inflammatory bowel disease Neg Hx     Allergies as of 08/07/2022   (No Known  Allergies)    Current Outpatient Medications on File Prior to Encounter  Medication Sig Dispense Refill   acetaminophen (TYLENOL) 500 MG tablet Take 500 mg by mouth every 6 (six) hours as needed for mild pain or moderate pain.     sulfaSALAzine (AZULFIDINE) 500 MG EC tablet Take 2 tablets (1,000 mg total) by mouth 2 (two) times daily with food 120 tablet 1   ondansetron (ZOFRAN) 8 MG tablet Take 1 tablet (8 mg total) by mouth every 8 (eight) hours as needed for nausea. (Patient not taking: Reported on 04/26/2022) 15 tablet 2   sulfaSALAzine (AZULFIDINE) 500 MG EC tablet Take 2 tablets (1,000 mg total) by mouth 2 (two) times daily. Take with food 120 tablet 5   No current facility-administered medications on file prior to encounter.   REVIEW OF SYSTEMS:  Review of Systems  Respiratory:  Negative for shortness of breath.   Cardiovascular:  Positive for leg swelling. Negative for chest pain and palpitations.  Musculoskeletal:  Positive for myalgias.  Neurological:  Positive for tingling. Negative for dizziness.   PHYSICAL EXAMINATION:  Vitals:   08/07/22 1259  BP: 110/86  Pulse: 74  SpO2: 98%    Physical Exam Vitals reviewed.  Cardiovascular:     Rate and Rhythm: Normal rate.  Pulmonary:     Effort: Pulmonary effort is normal.  Musculoskeletal:        General: Tenderness present.     Right lower leg: Edema (2+) present.     Left lower leg: Edema (1+) present.  Skin:    Findings: Erythema present. No bruising.  Psychiatric:        Mood and Affect: Mood normal.        Behavior: Behavior normal.        Thought Content: Thought content normal.   Villalta Score for Post-Thrombotic Syndrome: Pain: Moderate Cramps: Moderate Heaviness: Moderate Paresthesia: Moderate Pruritus: Absent Pretibial Edema: Moderate Skin Induration: Absent Hyperpigmentation: Absent Redness: Mild Venous Ectasia: Absent Pain on calf compression: Mild Villalta Preliminary Score: 12 Is venous ulcer  present?: No If venous ulcer is present and score is <15, then 15 points total are assigned: Absent Villalta Total Score: 12  LABS:  CBC     Component Value Date/Time   WBC 10.3 08/05/2022 2229   RBC 4.67 08/05/2022 2229   HGB 14.2 08/05/2022 2229   HGB 15.1 04/24/2021 0746   HCT 45.0 08/05/2022 2229   HCT 46.0 04/24/2021 0746   PLT 221 08/05/2022 2229   PLT 294 04/24/2021 0746   MCV 96.4 08/05/2022 2229   MCV 93 04/24/2021 0746   MCH 30.4 08/05/2022 2229   MCHC 31.6 08/05/2022 2229   RDW 13.3 08/05/2022 2229   RDW 12.8 04/24/2021 0746   LYMPHSABS 2.0 08/05/2022 2229   LYMPHSABS 2.0 04/24/2021 0746   MONOABS 0.9 08/05/2022 2229   EOSABS 0.2 08/05/2022 2229   EOSABS 0.3 04/24/2021 0746   BASOSABS 0.0 08/05/2022 2229   BASOSABS 0.1 04/24/2021 0746    Hepatic Function      Component Value Date/Time   PROT 6.5 08/05/2022 2229   PROT 6.5 04/24/2021 0746   ALBUMIN 3.4 (L) 08/05/2022 2229   ALBUMIN 3.9 04/24/2021 0746   AST 16 08/05/2022 2229   ALT 18 08/05/2022 2229   ALKPHOS 54 08/05/2022 2229   BILITOT 0.4 08/05/2022 2229   BILITOT 0.2 04/24/2021 0746   BILIDIR 0.1 06/28/2016 1135   BILIDIR 0.10 06/27/2016 0851   IBILI 0.2 (L) 06/28/2016 1135    Renal Function   Lab Results  Component Value Date   CREATININE 1.01 (H) 08/05/2022   CREATININE 0.90 04/24/2021   CREATININE 0.76 04/05/2021    Estimated Creatinine Clearance: 66.4 mL/min (A) (by C-G formula based on SCr of 1.01 mg/dL (H)).   [facility] Vascular Lab Studies:  08/07/22 VAS [place] LOWER EXTREMITY VENOUS (DVT)RIGHT  Summary:  RIGHT:  - Findings consistent with acute deep vein thrombosis involving the right  peroneal veins, right soleal veins, and right gastrocnemius veins.  - No cystic structure found in the popliteal fossa.    LEFT:  - No evidence of common femoral vein obstruction.   ASSESSMENT: Location of DVT: Right distal vein Risk factors for DVT include smoking, recent 4 hour drive from the beach,  inflammatory arthritis and associated bowel disease though she has not had any recent flares. 4 hour travel  is not particularly prolonged, and she previously drove to the beach nearly every weekend before she started renting out her condo more regularly and never had any clots before now. However, the drive did immediately precede her symptom onset. Given the multiple minor risk factors that don't seem to be transient, will refer patient to hematology for further risk factor work up and recommendations on duration of treatment. Labs are current from 08/05/22 with no concerns for starting anticoagulation. Will start anticoagulation with Eliquis today.   PLAN: -Start apixaban (Eliquis) 10 mg twice daily for 7 days followed by 5 mg twice daily. -Expected duration of therapy: Pending further work up and recommendations by hematology. Therapy started on 08/07/22. -Patient educated on purpose, proper use and potential adverse effects of apixaban (Eliquis). -Discussed importance of taking medication around the same time every day. -Advised patient of medications to avoid (NSAIDs, aspirin doses >100 mg daily). -Educated that Tylenol (acetaminophen) is the preferred analgesic to lower the risk of bleeding. -Advised patient to alert all providers of anticoagulation therapy prior to starting a new medication or having a procedure. -Emphasized importance of monitoring for signs and symptoms of bleeding (abnormal bruising, prolonged bleeding, nose bleeds, bleeding from gums, discolored urine, black tarry stools). -Educated patient to present to the ED if emergent signs and symptoms of new thrombosis occur. -Counseled patient to wear compression stockings daily, removing at night. Encouraged elevation to help with swelling as well.  -Refills provided to her preferred pharmacy. Provided her with copay card to reduce cost of refills.   Follow up: Referral to hematology placed. Follow up in DVT Clinic in 6 weeks.    Pervis Hocking, PharmD, [place], CPP Deep Vein Thrombosis Clinic Clinical Pharmacist Practitioner Office: [phone]  I have evaluated the patient's chart/imaging and refer this patient to the Clinical Pharmacist Practitioner for medication management. I have reviewed the CPP's documentation and agree with her assessment and plan. I was immediately available during the visit for questions and collaboration.   Durene Cal, MD

## 2022-08-09 ENCOUNTER — Ambulatory Visit: Payer: Commercial Managed Care - PPO | Admitting: Family Medicine

## 2022-08-09 VITALS — BP 127/91 | HR 90 | Wt 182.4 lb

## 2022-08-09 DIAGNOSIS — I82451 Acute embolism and thrombosis of right peroneal vein: Secondary | ICD-10-CM

## 2022-08-09 DIAGNOSIS — M79604 Pain in right leg: Secondary | ICD-10-CM

## 2022-08-09 DIAGNOSIS — R600 Localized edema: Secondary | ICD-10-CM

## 2022-08-09 NOTE — Progress Notes (Unsigned)
   Subjective:    Patient ID: Ruth Robertson, female    DOB: 1965/09/07, 57 y.o.   MRN: [id number]  HPI Patient arrives today for follow up on blood clot.  Patient recently diagnosed with DVT Had made a trip to the beach More than likely the trigger Is a smoker Denies any chest tightness pressure pain shortness of breath Relates soreness and pain and discomfort in the leg Relates compliance with Eliquis  Review of Systems     Objective:   Physical Exam  General-in no acute distress Eyes-no discharge Lungs-respiratory rate normal, CTA CV-no murmurs,RRR Extremities skin warm dry no edema Neuro grossly normal Behavior normal, alert Patient with severe pain behind the right knee and calf with swelling in the right lower leg no arterial circulation issues.  Patient denies any shortness of breath.      Assessment & Plan:  Right leg pain Some tenderness No sign of any injury DVT noted More than likely triggered by her travel to the beach DVT noted On Eliquis Warning signs regarding how to take Eliquis, what to watch for, avoiding falls and cuts were discussed Tramadol for pain Patient recommended not to do her second job for the next 3 weeks We will do a follow-up again in 3 to 4 months time follow-up sooner problems she is being followed by the DVT clinic Warning signs regarding pulmonary embolus discussed We will touch base with vascular surgery regarding getting her set up with hematology

## 2022-08-12 ENCOUNTER — Observation Stay (HOSPITAL_COMMUNITY): Payer: Commercial Managed Care - PPO

## 2022-08-12 ENCOUNTER — Encounter (HOSPITAL_COMMUNITY): Payer: Self-pay | Admitting: Emergency Medicine

## 2022-08-12 ENCOUNTER — Other Ambulatory Visit: Payer: Self-pay

## 2022-08-12 ENCOUNTER — Emergency Department (HOSPITAL_COMMUNITY): Payer: Commercial Managed Care - PPO

## 2022-08-12 ENCOUNTER — Inpatient Hospital Stay (HOSPITAL_COMMUNITY)
Admission: EM | Admit: 2022-08-12 | Discharge: 2022-08-13 | DRG: 176 | Disposition: A | Discharge status: Home / Self Care | Payer: Commercial Managed Care - PPO | Attending: Family Medicine | Admitting: Family Medicine

## 2022-08-12 DIAGNOSIS — I2699 Other pulmonary embolism without acute cor pulmonale: Secondary | ICD-10-CM | POA: Diagnosis not present

## 2022-08-12 DIAGNOSIS — F17210 Nicotine dependence, cigarettes, uncomplicated: Secondary | ICD-10-CM | POA: Diagnosis present

## 2022-08-12 DIAGNOSIS — Z823 Family history of stroke: Secondary | ICD-10-CM

## 2022-08-12 DIAGNOSIS — M479 Spondylosis, unspecified: Secondary | ICD-10-CM | POA: Diagnosis present

## 2022-08-12 DIAGNOSIS — Z8249 Family history of ischemic heart disease and other diseases of the circulatory system: Secondary | ICD-10-CM

## 2022-08-12 DIAGNOSIS — Z833 Family history of diabetes mellitus: Secondary | ICD-10-CM

## 2022-08-12 DIAGNOSIS — I2609 Other pulmonary embolism with acute cor pulmonale: Secondary | ICD-10-CM

## 2022-08-12 DIAGNOSIS — Z720 Tobacco use: Secondary | ICD-10-CM | POA: Diagnosis present

## 2022-08-12 DIAGNOSIS — F418 Other specified anxiety disorders: Secondary | ICD-10-CM | POA: Diagnosis present

## 2022-08-12 DIAGNOSIS — F32A Depression, unspecified: Secondary | ICD-10-CM | POA: Diagnosis present

## 2022-08-12 DIAGNOSIS — Z86718 Personal history of other venous thrombosis and embolism: Secondary | ICD-10-CM

## 2022-08-12 DIAGNOSIS — Z7901 Long term (current) use of anticoagulants: Secondary | ICD-10-CM

## 2022-08-12 DIAGNOSIS — F411 Generalized anxiety disorder: Secondary | ICD-10-CM | POA: Diagnosis present

## 2022-08-12 DIAGNOSIS — J9811 Atelectasis: Secondary | ICD-10-CM | POA: Diagnosis not present

## 2022-08-12 DIAGNOSIS — I82451 Acute embolism and thrombosis of right peroneal vein: Secondary | ICD-10-CM

## 2022-08-12 DIAGNOSIS — J984 Other disorders of lung: Secondary | ICD-10-CM | POA: Diagnosis not present

## 2022-08-12 DIAGNOSIS — R079 Chest pain, unspecified: Secondary | ICD-10-CM | POA: Diagnosis not present

## 2022-08-12 DIAGNOSIS — M47819 Spondylosis without myelopathy or radiculopathy, site unspecified: Secondary | ICD-10-CM | POA: Diagnosis present

## 2022-08-12 DIAGNOSIS — R918 Other nonspecific abnormal finding of lung field: Secondary | ICD-10-CM | POA: Diagnosis not present

## 2022-08-12 DIAGNOSIS — K219 Gastro-esophageal reflux disease without esophagitis: Secondary | ICD-10-CM | POA: Diagnosis present

## 2022-08-12 DIAGNOSIS — R0789 Other chest pain: Secondary | ICD-10-CM | POA: Diagnosis not present

## 2022-08-12 LAB — APTT
aPTT: 32 seconds (ref 24–36)
aPTT: 77 seconds — ABNORMAL HIGH (ref 24–36)

## 2022-08-12 LAB — PROTIME-INR
INR: 1.3 — ABNORMAL HIGH (ref 0.8–1.2)
INR: 1.1 (ref 0.8–1.2)
Prothrombin Time: 16.7 seconds — ABNORMAL HIGH (ref 11.4–15.2)
Prothrombin Time: 14 seconds (ref 11.4–15.2)

## 2022-08-12 LAB — TROPONIN I (HIGH SENSITIVITY)
Troponin I (High Sensitivity): 3 ng/L (ref <18)
Troponin I (High Sensitivity): 3 ng/L (ref <18)

## 2022-08-12 LAB — BASIC METABOLIC PANEL
Anion gap: 11 (ref 5–15)
BUN: 19 mg/dL (ref 6–20)
CO2: 24 mmol/L (ref 22–32)
Calcium: 9.3 mg/dL (ref 8.9–10.3)
Chloride: 103 mmol/L (ref 98–111)
Creatinine, Ser: 1.02 mg/dL — ABNORMAL HIGH (ref 0.44–1.00)
GFR, Estimated: >60 mL/min (ref >60)
Glucose, Bld: 97 mg/dL (ref 70–99)
Potassium: 3.9 mmol/L (ref 3.5–5.1)
Sodium: 138 mmol/L (ref 135–145)

## 2022-08-12 LAB — CBC
HCT: 47.4 % — ABNORMAL HIGH (ref 36.0–46.0)
Hemoglobin: 15.3 g/dL — ABNORMAL HIGH (ref 12.0–15.0)
MCH: 30.7 pg (ref 26.0–34.0)
MCHC: 32.3 g/dL (ref 30.0–36.0)
MCV: 95.2 fL (ref 80.0–100.0)
Platelets: 307 10*3/uL (ref 150–400)
RBC: 4.98 MIL/uL (ref 3.87–5.11)
RDW: 12.5 % (ref 11.5–15.5)
WBC: 11.2 10*3/uL — ABNORMAL HIGH (ref 4.0–10.5)
nRBC: 0 % (ref 0.0–0.2)

## 2022-08-12 LAB — ECHOCARDIOGRAM COMPLETE
Area-P 1/2: 2.94 cm2
Calc EF: 63.7 %
Height: 65 in
MV VTI: 2.74 cm2
S' Lateral: 2.8 cm
Single Plane A2C EF: 64.4 %
Single Plane A4C EF: 61.4 %
Weight: 2912 oz

## 2022-08-12 LAB — HEPARIN LEVEL (UNFRACTIONATED): Heparin Unfractionated: >1.1 IU/mL — ABNORMAL HIGH (ref 0.30–0.70)

## 2022-08-12 LAB — D-DIMER, QUANTITATIVE: D-Dimer, Quant: 2.86 ug/mL-FEU — ABNORMAL HIGH (ref 0.00–0.50)

## 2022-08-12 MED ORDER — WARFARIN SODIUM 5 MG PO TABS
5.0000 mg | ORAL_TABLET | Freq: Once | ORAL | Status: AC
  Administered 2022-08-12: 5 mg via ORAL
  Filled 2022-08-12: qty 1

## 2022-08-12 MED ORDER — ACETAMINOPHEN 500 MG PO TABS
500.0000 mg | ORAL_TABLET | Freq: Four times a day (QID) | ORAL | Status: DC | PRN
  Administered 2022-08-13 (×2): 500 mg via ORAL
  Filled 2022-08-12 (×3): qty 1

## 2022-08-12 MED ORDER — SODIUM CHLORIDE 0.9% FLUSH
3.0000 mL | Freq: Two times a day (BID) | INTRAVENOUS | Status: DC
  Administered 2022-08-13: 3 mL via INTRAVENOUS

## 2022-08-12 MED ORDER — IOHEXOL 350 MG/ML SOLN
75.0000 mL | Freq: Once | INTRAVENOUS | Status: AC | PRN
  Administered 2022-08-12: 75 mL via INTRAVENOUS

## 2022-08-12 MED ORDER — SULFASALAZINE 500 MG PO TBEC
1000.0000 mg | DELAYED_RELEASE_TABLET | Freq: Two times a day (BID) | ORAL | Status: DC
  Administered 2022-08-12 – 2022-08-13 (×2): 1000 mg via ORAL
  Filled 2022-08-12 (×4): qty 2

## 2022-08-12 MED ORDER — HEPARIN (PORCINE) 25000 UT/250ML-% IV SOLN
1300.0000 [IU]/h | INTRAVENOUS | Status: DC
  Administered 2022-08-12 – 2022-08-13 (×2): 1300 [IU]/h via INTRAVENOUS
  Filled 2022-08-12 (×2): qty 250

## 2022-08-12 MED ORDER — ONDANSETRON HCL 4 MG PO TABS: 8.0000 mg | ORAL_TABLET | Freq: Three times a day (TID) | ORAL | Status: DC | PRN

## 2022-08-12 MED ORDER — HEPARIN BOLUS VIA INFUSION
2000.0000 [IU] | Freq: Once | INTRAVENOUS | Status: AC
  Administered 2022-08-12: 2000 [IU] via INTRAVENOUS

## 2022-08-12 MED ORDER — WARFARIN - PHARMACIST DOSING INPATIENT: Freq: Every day | Status: DC

## 2022-08-12 MED ORDER — WARFARIN SODIUM 5 MG PO TABS: 5.0000 mg | ORAL_TABLET | Freq: Once | ORAL | Status: DC

## 2022-08-12 MED ORDER — NICOTINE 21 MG/24HR TD PT24: 21.0000 mg | MEDICATED_PATCH | Freq: Every day | TRANSDERMAL | Status: DC | PRN

## 2022-08-12 NOTE — Progress Notes (Addendum)
ANTICOAGULATION CONSULT NOTE - Initial Consult  Pharmacy Consult for heparin Indication: pulmonary embolus  No Known Allergies  Patient Measurements: Height: 5\' 5"  (165.1 cm) Weight: 82.6 kg (182 lb) IBW/kg (Calculated) : 57 Heparin Dosing Weight: 74.6 kg  Vital Signs: Temp: 98.3 F (36.8 C) (07/08 1139) BP: 127/94 (07/08 1345) Pulse Rate: 78 (07/08 1345)  Labs: Recent Labs    08/12/22 1201  HGB 15.3*  HCT 47.4*  PLT 307  LABPROT 16.7*  INR 1.3*  CREATININE 1.02*  TROPONINIHS 3    Estimated Creatinine Clearance: 65.3 mL/min (A) (by C-G formula based on SCr of 1.02 mg/dL (H)).   Medical History: Past Medical History:  Diagnosis Date   Anxiety    Asthma    ONLY WITH BRONCHITIS   GAD (generalized anxiety disorder)    GERD (gastroesophageal reflux disease)    Microscopic hematuria 12/03/2017   Negative cystoscopy negative CAT scan   Pneumonia    PONV (postoperative nausea and vomiting)     Medications:  (Not in a hospital admission)   Assessment: Pharmacy consulted to dose heparin in patient with pulmonary embolism.  Patient is on Eliquis prior to admission due to recently diagnosed DVT and started on treatment dosing Eliquis 7/3. Last dose of apixaban noted to be 7/8 ~0930.  Will need to monitor aPTT until correlation with heparin levels.  CT angio shows pulmonary embolism w/ right heart strain. Would typically start heparin when next dose of apixaban due but due to right heart strain will start heparin infusion immediately.   Goal of Therapy:  Heparin level 0.3-0.7 units/ml aPTT 66-102 seconds Monitor platelets by anticoagulation protocol: Yes   Plan:  Give 2000 units bolus x 1 Start heparin infusion at 1300 units/hr Check anti-Xa level and aPTT in 6 hours and daily Continue to monitor H&H and platelets   Judeth Cornfield, PharmD Clinical Pharmacist 08/12/2022 2:05 PM

## 2022-08-12 NOTE — Consult Note (Addendum)
[facility] Consultation Oncology  Name: Ruth Robertson      MRN: [id number]    Location: A340/A340-01  Date: 08/12/2022 Time:6:04 PM   REFERRING PHYSICIAN: Dr. Jarvis Newcomer  REASON FOR CONSULT: Acute pulmonary embolism/failure of Eliquis     HISTORY OF PRESENT ILLNESS: [name] is seen in consultation today at the request of Dr. Jarvis Newcomer.  This patient originally diagnosed with Acute DVT involving right peroneal vein, right soleal vein and right gastrocnemius veins on 08/07/2022 after a beach trip.  She was reportedly started on Eliquis.  She presented with back pain today.  CT angiogram on 08/12/2022: Acute PE with CT evidence of right heart strain.  Patchy density in the superior segment of the right lower lobe could be infiltrate/infarct.  She is started on IV heparin.  Denies missing any doses of Eliquis.  Maternal aunt had breast cancer and another maternal aunt had colon cancer.  No family history of lupus anticoagulant.  No personal history of miscarriages.  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  PAST MEDICAL HISTORY:   Past Medical History:  Diagnosis Date   Anxiety    Asthma    ONLY WITH BRONCHITIS   GAD (generalized anxiety disorder)    GERD (gastroesophageal reflux disease)    Microscopic hematuria 12/03/2017   Negative cystoscopy negative CAT scan   Pneumonia    PONV (postoperative nausea and vomiting)     ALLERGIES: No Known Allergies    MEDICATIONS: I have reviewed the patient's current medications.     PAST SURGICAL HISTORY Past Surgical History:  Procedure Laterality Date   AGILE CAPSULE N/A 07/09/2016   Procedure: AGILE CAPSULE;  Surgeon: West Bali, MD;  [facility];  Service: Endoscopy;  Laterality: N/A;  7:30am, pt to arrive at 7:00am   BIOPSY  09/19/2015   Procedure: BIOPSY;  Surgeon: West Bali, MD;  [facility]: [facility];  Service: Endoscopy;;  random colon bx's   COLONOSCOPY WITH  PROPOFOL N/A 09/19/2015   Dr. Darrick Penna: five 2-3 mm polyps in rectum (hyperplastic) and descending colon, diverticulosis in sigmoid colon and transverse colon. Query post-infectious IBS. Next colonoscopy in 10 years    CYST EXCISION N/A 01/20/2019   Procedure: EXCISION OF RIGHT GROIN SEBACEOUS CYST;  Surgeon: Harriette Bouillon, MD;  Location: [facility];  Service: General;  Laterality: N/A;   GIVENS CAPSULE STUDY N/A 07/30/2016   Procedure: GIVENS CAPSULE STUDY;  Surgeon: West Bali, MD;  [facility];  Service: Endoscopy;  Laterality: N/A;   HYSTEROSCOPY  2006   POLYPECTOMY  09/19/2015   Procedure: POLYPECTOMY;  Surgeon: West Bali, MD;  [facility];  Service: Endoscopy;;  descending colon polyps x3 cold bx, rectal polyp x2   SALPINGOOPHORECTOMY Bilateral 08/18/2012   Procedure: SALPINGO OOPHORECTOMY;  Surgeon: Dara Lords, MD;  Location: [facility];  Service: Gynecology;  Laterality: Bilateral;   VAGINAL HYSTERECTOMY N/A 08/18/2012   Procedure: HYSTERECTOMY VAGINAL;  Surgeon: Dara Lords, MD;  Location: [facility];  Service: Gynecology;  Laterality: N/A;    FAMILY HISTORY: Family History  Problem Relation Age of Onset   Heart attack Mother    Heart failure Father    Stroke Father    Hypertension Maternal Grandmother    Diabetes Paternal Grandfather    Breast cancer Maternal Aunt        50's   Colon cancer Maternal Aunt  late 36s or 70s   Inflammatory bowel disease Neg Hx     SOCIAL HISTORY:  reports that she has been smoking cigarettes. She has a 25.00 pack-year smoking history. She has never used smokeless tobacco. She reports that she does not currently use alcohol. She reports that she does not use drugs.  PERFORMANCE STATUS: The patient's performance status is 1 - Symptomatic but completely ambulatory  PHYSICAL EXAM: Most Recent Vital Signs: Blood pressure (!) 132/99, pulse 85, temperature 98.5 F (36.9 C), temperature  source Oral, resp. rate 18, height 5\' 5"  (1.651 m), weight 182 lb (82.6 kg), last menstrual period 06/12/2012, SpO2 (!) 0 %. BP (!) 132/99 (BP Location: Right Arm)   Pulse 85   Temp 98.5 F (36.9 C) (Oral)   Resp 18   Ht 5\' 5"  (1.651 m)   Wt 182 lb (82.6 kg)   LMP 06/12/2012   SpO2 (!) 0%   BMI 30.29 kg/m  General appearance: alert, cooperative, and appears stated age Neck: no adenopathy, supple, symmetrical, trachea midline, and thyroid not enlarged, symmetric, no tenderness/mass/nodules Abdomen: soft, non-tender; bowel sounds normal; no masses,  no organomegaly Extremities:  No swelling or ecchymosis. Lymph nodes: Cervical, supraclavicular, and axillary nodes normal.  LABORATORY DATA:  Results for orders placed or performed during the hospital encounter of 08/12/22 (from the past 48 hour(s))  Basic metabolic panel     Status: Abnormal   Collection Time: 08/12/22 12:01 PM  Result Value Ref Range   Sodium 138 135 - 145 mmol/L   Potassium 3.9 3.5 - 5.1 mmol/L   Chloride 103 98 - 111 mmol/L   CO2 24 22 - 32 mmol/L   Glucose, Bld 97 70 - 99 mg/dL    Comment: Glucose reference range applies only to samples taken after fasting for at least 8 hours.   BUN 19 6 - 20 mg/dL   Creatinine, Ser 7.42 (H) 0.44 - 1.00 mg/dL   Calcium 9.3 8.9 - 59.5 mg/dL   GFR, Estimated >63 >87 mL/min    Comment: (NOTE) Calculated using the CKD-EPI Creatinine Equation (2021)    Anion gap 11 5 - 15    Comment: Performed at [facility], [address], [place], [place] [address]  CBC     Status: Abnormal   Collection Time: 08/12/22 12:01 PM  Result Value Ref Range   WBC 11.2 (H) 4.0 - 10.5 K/uL   RBC 4.98 3.87 - 5.11 MIL/uL   Hemoglobin 15.3 (H) 12.0 - 15.0 g/dL   HCT 29.5 (H) 18.8 - 41.6 %   MCV 95.2 80.0 - 100.0 fL   MCH 30.7 26.0 - 34.0 pg   MCHC 32.3 30.0 - 36.0 g/dL   RDW 60.6 30.1 - 60.1 %   Platelets 307 150 - 400 K/uL   nRBC 0.0 0.0 - 0.2 %    Comment: Performed at [facility], [address] [place], [place] [address]  Troponin I (High Sensitivity)     Status: None   Collection Time: 08/12/22 12:01 PM  Result Value Ref Range   Troponin I (High Sensitivity) 3 <18 ng/L    Comment: (NOTE) Elevated high sensitivity troponin I (hsTnI) values and significant  changes across serial measurements may suggest ACS but many other  chronic and acute conditions are known to elevate hsTnI results.  Refer to the "Links" section for chest pain algorithms and additional  guidance. Performed at [facility], [address] [place], [place] [address]   Protime-INR (order if  Patient is taking Coumadin / Warfarin)     Status: Abnormal   Collection Time: 08/12/22 12:01 PM  Result Value Ref Range   Prothrombin Time 16.7 (H) 11.4 - 15.2 seconds   INR 1.3 (H) 0.8 - 1.2    Comment: (NOTE) INR goal varies based on device and disease states. Performed at [facility], [address], [place], [place] [address]   D-dimer, quantitative     Status: Abnormal   Collection Time: 08/12/22 12:01 PM  Result Value Ref Range   D-Dimer, Quant 2.86 (H) 0.00 - 0.50 ug/mL-FEU    Comment: (NOTE) At the manufacturer cut-off value of 0.5 g/mL FEU, this assay has a negative predictive value of 95-100%.This assay is intended for use in conjunction with a clinical pretest probability (PTP) assessment model to exclude pulmonary embolism (PE) and deep venous thrombosis (DVT) in outpatients suspected of PE or DVT. Results should be correlated with clinical presentation. Performed at [facility], [address], [place], [place] [address]   Heparin level (unfractionated)     Status: Abnormal   Collection Time: 08/12/22 12:01 PM  Result Value Ref Range   Heparin Unfractionated >1.10 (H) 0.30 - 0.70 IU/mL    Comment: (NOTE) The clinical reportable range upper limit is being lowered to >1.10 to align with the FDA approved guidance for the current laboratory assay.  If heparin results are below expected values,  and patient dosage has  been confirmed, suggest follow up testing of antithrombin III levels. Performed at [facility], [address] [place], [place] [address]   APTT     Status: None   Collection Time: 08/12/22 12:01 PM  Result Value Ref Range   aPTT 32 24 - 36 seconds    Comment: Performed at [facility], [address], [place], [place] [address]  Troponin I (High Sensitivity)     Status: None   Collection Time: 08/12/22  2:01 PM  Result Value Ref Range   Troponin I (High Sensitivity) 3 <18 ng/L    Comment: (NOTE) Elevated high sensitivity troponin I (hsTnI) values and significant  changes across serial measurements may suggest ACS but many other  chronic and acute conditions are known to elevate hsTnI results.  Refer to the "Links" section for chest pain algorithms and additional  guidance. Performed at [facility], [address], [place], [place] [address]       RADIOGRAPHY: ECHOCARDIOGRAM COMPLETE  Result Date: 08/12/2022    ECHOCARDIOGRAM REPORT   Patient Name:   Ruth Robertson Date of Exam: 08/12/2022 Medical Rec #:  [id number]                Height:       65.0 in Accession #:    [id number]               Weight:       182.0 lb Date of Birth:  04-23-1965                BSA:          1.900 m Patient Age:    56 years                 BP:           127/94 mmHg Patient Gender: F                        HR:  72 bpm. [facility] Procedure: 2D Echo, Cardiac Doppler and Color Doppler Indications:    Pulmonary embolus  History:        Patient has prior history of Echocardiogram examinations, most                 recent 03/04/2021.  Sonographer:    Milda Smart Referring Phys: [id number] IMPRESSIONS  1. Left ventricular ejection fraction, by estimation, is 60 to 65%. The left ventricle has normal function. The left ventricle has no regional wall motion abnormalities. Left ventricular diastolic parameters were normal.  2. Right ventricular systolic  function is normal. The right ventricular size is normal. Tricuspid regurgitation signal is inadequate for assessing PA pressure.  3. The mitral valve is grossly normal. Trivial mitral valve regurgitation.  4. The aortic valve is tricuspid. Aortic valve regurgitation is not visualized.  5. The inferior vena cava is normal in size with greater than 50% respiratory variability, suggesting right atrial pressure of 3 mmHg. Comparison(s): Prior images unable to be directly viewed. FINDINGS  Left Ventricle: Left ventricular ejection fraction, by estimation, is 60 to 65%. The left ventricle has normal function. The left ventricle has no regional wall motion abnormalities. The left ventricular internal cavity size was normal in size. There is  no left ventricular hypertrophy. Left ventricular diastolic parameters were normal. Right Ventricle: The right ventricular size is normal. No increase in right ventricular wall thickness. Right ventricular systolic function is normal. Tricuspid regurgitation signal is inadequate for assessing PA pressure. Left Atrium: Left atrial size was normal in size. Right Atrium: Right atrial size was normal in size. Pericardium: There is no evidence of pericardial effusion. Mitral Valve: The mitral valve is grossly normal. Trivial mitral valve regurgitation. MV peak gradient, 3.8 mmHg. The mean mitral valve gradient is 2.0 mmHg. Tricuspid Valve: The tricuspid valve is grossly normal. Tricuspid valve regurgitation is trivial. Aortic Valve: The aortic valve is tricuspid. There is mild aortic valve annular calcification. Aortic valve regurgitation is not visualized. Pulmonic Valve: The pulmonic valve was grossly normal. Pulmonic valve regurgitation is trivial. Aorta: The aortic root is normal in size and structure. Venous: The inferior vena cava is normal in size with greater than 50% respiratory variability, suggesting right atrial pressure of 3 mmHg. IAS/Shunts: No atrial level shunt detected by  color flow Doppler.  LEFT VENTRICLE PLAX 2D LVIDd:         4.30 cm     Diastology LVIDs:         2.80 cm     LV e' medial:    9.46 cm/s LV PW:         0.70 cm     LV E/e' medial:  7.2 LV IVS:        0.80 cm     LV e' lateral:   8.38 cm/s LVOT diam:     1.90 cm     LV E/e' lateral: 8.1 LV SV:         69 LV SV Index:   37 LVOT Area:     2.84 cm  LV Volumes (MOD) LV vol d, MOD A2C: 34.0 ml LV vol d, MOD A4C: 56.8 ml LV vol s, MOD A2C: 12.1 ml LV vol s, MOD A4C: 21.9 ml LV SV MOD A2C:     21.9 ml LV SV MOD A4C:     56.8 ml LV SV MOD BP:      29.0 ml RIGHT VENTRICLE  IVC RV S prime:     12.50 cm/s  IVC diam: 1.20 cm TAPSE (M-mode): 2.3 cm LEFT ATRIUM             Index        RIGHT ATRIUM          Index LA diam:        2.70 cm 1.42 cm/m   RA Area:     9.72 cm LA Vol (A2C):   21.3 ml 11.23 ml/m  RA Volume:   18.80 ml 9.89 ml/m LA Vol (A4C):   27.5 ml 14.47 ml/m LA Biplane Vol: 31.4 ml 16.52 ml/m  AORTIC VALVE LVOT Vmax:   123.00 cm/s LVOT Vmean:  86.300 cm/s LVOT VTI:    0.245 m  AORTA Ao Root diam: 2.40 cm Ao Asc diam:  3.00 cm MITRAL VALVE MV Area (PHT): 2.94 cm    SHUNTS MV Area VTI:   2.74 cm    Systemic VTI:  0.24 m MV Peak grad:  3.8 mmHg    Systemic Diam: 1.90 cm MV Mean grad:  2.0 mmHg MV Vmax:       0.97 m/s MV Vmean:      63.1 cm/s MV Decel Time: 258 msec MV E velocity: 67.70 cm/s MV A velocity: 87.00 cm/s MV E/A ratio:  0.78 Nona Dell MD Electronically signed by Nona Dell MD Signature Date/Time: 08/12/2022/5:05:54 PM    Final    CT Angio Chest PE W/Cm &/Or Wo Cm  Result Date: 08/12/2022 CLINICAL DATA:  Pulmonary embolism suspected. High probability. Right-sided chest pain. EXAM: CT ANGIOGRAPHY CHEST WITH CONTRAST TECHNIQUE: Multidetector CT imaging of the chest was performed using the standard protocol during bolus administration of intravenous contrast. Multiplanar CT image reconstructions and MIPs were obtained to evaluate the vascular anatomy. RADIATION DOSE REDUCTION: This  exam was performed according to the departmental dose-optimization program which includes automated exposure control, adjustment of the mA and/or kV according to patient size and/or use of iterative reconstruction technique. CONTRAST:  75mL OMNIPAQUE IOHEXOL 350 MG/ML SOLN COMPARISON:  Chest radiography same day FINDINGS: Cardiovascular: Overall heart size is normal. No visible coronary artery calcification. Very minimal aortic atherosclerotic calcification. Pulmonary arterial opacification is good. Pulmonary emboli are present within the right pulmonary artery branches, most extensive in the lower lobe. There may be a few tiny emboli on the left. Right ventricular to left ventricular ratio is 1.09. Mediastinum/Nodes: No mass or lymphadenopathy. Lungs/Pleura: Patchy density in the superior segment of the right lower lobe that could be infiltrate or infarction. Mild atelectasis otherwise of both lung bases. No pleural effusion. Upper Abdomen: Negative Musculoskeletal: No significant finding. Review of the MIP images confirms the above findings. IMPRESSION: 1. Positive for acute PE with CT evidence of right heart strain (RV/LV Ratio = 1.09) consistent with at least submassive (intermediate risk) PE. The presence of right heart strain has been associated with an increased risk of morbidity and mortality. Please refer to the "Code PE Focused" order set in EPIC. 2. Patchy density in the superior segment of the right lower lobe that could be infiltrate or infarction. Mild atelectasis otherwise of both lung bases. 3. These results were called by telephone at the time of interpretation on 08/12/2022 at 1:18 pm to provider [facility] , who verbally acknowledged these results. Aortic Atherosclerosis (ICD10-I70.0). Electronically Signed   By: Paulina Fusi M.D.   On: 08/12/2022 13:19   DG Chest 2 View  Result Date: 08/12/2022 CLINICAL DATA:  CP EXAM:  CHEST - 2 VIEW COMPARISON:  CXR 02/28/21 FINDINGS: No pleural effusion. No  pneumothorax. Unchanged cardiac and mediastinal contours. Linear opacity at the left base most likely represent atelectasis. No radiographically apparent displaced rib fractures. Visualized upper abdomen is unremarkable. Vertebral body heights are maintained. IMPRESSION: Likely left basilar atelectasis. Electronically Signed   By: Lorenza Cambridge M.D.   On: 08/12/2022 12:34         ASSESSMENT and PLAN:  1.  Acute pulmonary embolism: - Drove back from beach for 4 hours with 1 stop.  Noticed right knee pain and evaluated in the ER on 08/03/2022 and 08/06/2022 - 08/07/2022: Acute DVT involving right peroneal veins, right soleal veins, right gastrinomas veins - Eliquis started with loading dose. - Woke up this morning with pleuritic pain in the right shoulder/upper back. - CT angiogram (08/12/2022): Positive for acute PE with CT evidence of right heart strain, consistent with at least submassive PE.  Patchy density in the superior segment of the right lower lobe consistent with infiltrate/infarction. - Currently started on IV heparin. - No personal history of miscarriages. - Will check lupus anticoagulant, anticardiolipin and antibeta 2 glycoprotein antibodies. - It is not entirely clear if she is resistant to Eliquis.  However given the size of the embolus, recommend switching anticoagulant to a different class. - Recommend starting her on Coumadin 5 mg daily starting tonight.  May switch IV heparin to Lovenox 1 mg/kg every 12 hours. - Please send her home with overlapping Lovenox until we make sure she has therapeutic INR.  I can check her PTT/INR in our clinic on Friday. - Discussed with Dr. Jarvis Newcomer.  All questions were answered. The patient knows to call the clinic with any problems, questions or concerns. We can certainly see the patient much sooner if necessary.    Doreatha Massed

## 2022-08-12 NOTE — ED Notes (Signed)
Pt lost IV access in R upper arm, RN certified in [place] IV will attempt to obtain IV access. Will start heparin as soon as IV access established. RN at bedside with [place]

## 2022-08-12 NOTE — Progress Notes (Signed)
  Echocardiogram 2D Echocardiogram has been performed.  Ruth Robertson 08/12/2022, 3:04 PM

## 2022-08-12 NOTE — ED Notes (Signed)
MD able to obtain US IV access in LAC, heparin started

## 2022-08-12 NOTE — ED Triage Notes (Signed)
Pt via POV c/o right-sided chest pain with mirrored pain to right upper back, worse with inhalation. 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 She is taking eliquis twice daily. No prior DVT or PE.

## 2022-08-12 NOTE — ED Notes (Signed)
MD, Eber Hong, at bedside attempting [place] IV access. Second RN unable to obtain US IV access

## 2022-08-12 NOTE — ED Notes (Signed)
Pt states she took her dose of eliquis this morning at around 0930-1030

## 2022-08-12 NOTE — Consult Note (Signed)
ANTICOAGULATION CONSULT NOTE - Initial Consult  Pharmacy Consult for warfarin dosing Indication: pulmonary embolus and DVT  No Known Allergies  Patient Measurements: Height: 5\' 5"  (165.1 cm) Weight: 82.6 kg (182 lb) IBW/kg (Calculated) : 57  Vital Signs: Temp: 98.5 F (36.9 C) (07/08 1629) Temp Source: Oral (07/08 1629) BP: 132/99 (07/08 1629) Pulse Rate: 85 (07/08 1629)  Labs: Recent Labs    08/12/22 1201 08/12/22 1401  HGB 15.3*  --   HCT 47.4*  --   PLT 307  --   APTT 32  --   LABPROT 16.7*  --   INR 1.3*  --   HEPARINUNFRC >1.10*  --   CREATININE 1.02*  --   TROPONINIHS 3 3    Estimated Creatinine Clearance: 65.3 mL/min (A) (by C-G formula based on SCr of 1.02 mg/dL (H)).   Medical History: Past Medical History:  Diagnosis Date   Anxiety    Asthma    ONLY WITH BRONCHITIS   GAD (generalized anxiety disorder)    GERD (gastroesophageal reflux disease)    Microscopic hematuria 12/03/2017   Negative cystoscopy negative CAT scan   Pneumonia    PONV (postoperative nausea and vomiting)     Medications:  Medications Prior to Admission  Medication Sig Dispense Refill Last Dose   acetaminophen (TYLENOL) 500 MG tablet Take 500 mg by mouth every 6 (six) hours as needed for mild pain or moderate pain.      apixaban (ELIQUIS) 5 MG TABS tablet Take 1 tablet (5 mg total) by mouth 2 (two) times daily. Start taking after completion of starter pack. 60 tablet 5    APIXABAN (ELIQUIS) VTE STARTER PACK (10MG  AND 5MG ) Take as directed on package: start with two-5mg  tablets by mouth twice daily for 7 days. On day 8, switch to one-5mg  tablet twice daily. 74 each 0    ondansetron (ZOFRAN) 8 MG tablet Take 1 tablet (8 mg total) by mouth every 8 (eight) hours as needed for nausea. 15 tablet 2    sulfaSALAzine (AZULFIDINE) 500 MG EC tablet Take 2 tablets (1,000 mg total) by mouth 2 (two) times daily with food 120 tablet 1    sulfaSALAzine (AZULFIDINE) 500 MG EC tablet Take 2 tablets  (1,000 mg total) by mouth 2 (two) times daily. Take with food 120 tablet 5     Assessment: Pt developed  DVT post car ride from beach 6/29.  Started on Eliquis 7/03. Back to hospital w/ chest pain. CT + for PE.   Goal of Therapy:  INR 2-3 Heparin level 0.3-0.7 units/ml Monitor platelets by anticoagulation protocol: Yes   Plan:  Warfarin initial dose =5mg  per Oncologist. Recommended changing to Lovenox + warfarin for D/C home per note.  Expect to be changed from Heparin to Lovenox in AM.  INR w/ AM labs.  Rico Junker 08/12/2022,8:36 PM

## 2022-08-12 NOTE — H&P (Signed)
History and Physical    Patient: Ruth Robertson ZOX:[id number] DOB: [birth year] DOA: 08/12/2022 DOS: the patient was seen and examined on 08/12/2022 PCP: Babs Sciara, MD  Patient coming from: Home  Chief Complaint:  Chief Complaint  Patient presents with   Chest Pain   HPI: Ruth Robertson is a 57 y.o. 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 She had returned from a trip to the beach last weekend, noticed progressive swelling and pain to the right knee and later purple discoloration to the right lower leg prompting evaluation. Initial concern was meniscal tear, though negative arthrocentesis. An outpatient ultrasound on 7/3 revealed acute DVT involving right peroneal, soleal, and gastrocnemius veins. Full compressibility is noted in proximal veins. She initiated loading dose eliquis that day and has not missed a dose since. She woke up this morning with the pleuritic pain in right shoulder/upper back. She denies shortness of breath, anterior chest pain, exertional component to symptoms. She's never had a blood clot before, though her son had a provoked clot. She's had "GI issues" requiring admission at times, but never diagnosed with IBD/Crohn's/UC. No other new medications including estrogens.   Denies any GI bleeding, hematuria, nose bleeds since starting eliquis. No vomiting. She has always bruised easily but no change lately, no large bruising currently. +smoker.   Work up in the ED included CTA chest showing pulmonary emboli in right pulmonary artery branches with suspected infarct in superior RLL, possible emboli on left as well. She has been afebrile with normal HR and normal BP, no hypoxemia, nonlabored respirations. Troponin normal x2. IV heparin was started and admission was requested.   Review of Systems: As mentioned in the history of present  illness. All other systems reviewed and are negative. Past Medical History:  Diagnosis Date   Anxiety    Asthma    ONLY WITH BRONCHITIS   GAD (generalized anxiety disorder)    GERD (gastroesophageal reflux disease)    Microscopic hematuria 12/03/2017   Negative cystoscopy negative CAT scan   Pneumonia    PONV (postoperative nausea and vomiting)    Past Surgical History:  Procedure Laterality Date   AGILE CAPSULE N/A 07/09/2016   Procedure: AGILE CAPSULE;  Surgeon: West Bali, MD;  Location: [facility];  Service: Endoscopy;  Laterality: N/A;  7:30am, pt to arrive at 7:00am   BIOPSY  09/19/2015   Procedure: BIOPSY;  Surgeon: West Bali, MD;  Location: [facility];  Service: Endoscopy;;  random colon bx's   COLONOSCOPY WITH PROPOFOL N/A 09/19/2015   Dr. Darrick Penna: five 2-3 mm polyps in rectum (hyperplastic) and descending colon, diverticulosis in sigmoid colon and transverse colon. Query post-infectious IBS. Next colonoscopy in 10 years    CYST EXCISION N/A 01/20/2019   Procedure: EXCISION OF RIGHT GROIN SEBACEOUS CYST;  Surgeon: Harriette Bouillon, MD;  Location: [facility];  Service: General;  Laterality: N/A;   GIVENS CAPSULE STUDY N/A 07/30/2016   Procedure: GIVENS CAPSULE STUDY;  Surgeon: West Bali, MD;  Location: [facility];  Service: Endoscopy;  Laterality: N/A;   HYSTEROSCOPY  2006   POLYPECTOMY  09/19/2015   Procedure: POLYPECTOMY;  Surgeon: West Bali, MD;  Location: [facility];  Service: Endoscopy;;  descending colon polyps x3 cold bx, rectal polyp x2   SALPINGOOPHORECTOMY Bilateral 08/18/2012   Procedure: SALPINGO OOPHORECTOMY;  Surgeon: Dara Lords, MD;  Location: [facility];  Service: Gynecology;  Laterality: Bilateral;   VAGINAL HYSTERECTOMY N/A 08/18/2012   Procedure: HYSTERECTOMY VAGINAL;  Surgeon: Dara Lords, MD;  Location: [facility];  Service: Gynecology;  Laterality: N/A;   Social History:  reports that she has been smoking  cigarettes. She has a 25.00 pack-year smoking history. She has never used smokeless tobacco. She reports that she does not currently use alcohol. She reports that she does not use drugs.  No Known Allergies  Family History  Problem Relation Age of Onset   Heart attack Mother    Heart failure Father    Stroke Father    Hypertension Maternal Grandmother    Diabetes Paternal Grandfather    Breast cancer Maternal Aunt        50's   Colon cancer Maternal Aunt        late 76s or 56s   Inflammatory bowel disease Neg Hx     Prior to Admission medications   Medication Sig Start Date End Date Taking? Authorizing Provider  acetaminophen (TYLENOL) 500 MG tablet Take 500 mg by mouth every 6 (six) hours as needed for mild pain or moderate pain.    [provider]  apixaban (ELIQUIS) 5 MG TABS tablet Take 1 tablet (5 mg total) by mouth 2 (two) times daily. Start taking after completion of starter pack. 08/07/22   Pervis Hocking B, RPH-CPP  APIXABAN Everlene Balls) VTE STARTER PACK (10MG  AND 5MG ) Take as directed on package: start with two-5mg  tablets by mouth twice daily for 7 days. On day 8, switch to one-5mg  tablet twice daily. 08/07/22   Pervis Hocking B, RPH-CPP  ondansetron (ZOFRAN) 8 MG tablet Take 1 tablet (8 mg total) by mouth every 8 (eight) hours as needed for nausea. 01/24/21   Babs Sciara, MD  sulfaSALAzine (AZULFIDINE) 500 MG EC tablet Take 2 tablets (1,000 mg total) by mouth 2 (two) times daily with food 07/05/21     sulfaSALAzine (AZULFIDINE) 500 MG EC tablet Take 2 tablets (1,000 mg total) by mouth 2 (two) times daily. Take with food 12/13/21       Physical Exam: Vitals:   08/12/22 1140 08/12/22 1330 08/12/22 1345 08/12/22 1555  BP:  124/85 (!) 127/94   Pulse:  77 78   Resp:  (!) 22 (!) 23   Temp:    98.3 F (36.8 C)  TempSrc:    Oral  SpO2:  95% 96%   Weight: 82.6 kg     Height: 5\' 5"  (1.651 m)     Gen: Pleasant, well-appearing female in no acute distress Pulm: Clear, no  wheezes, nonlabored CV: RRR, NSR on monitor GI: Soft, NT, ND, +BS  Neuro: Alert and oriented. No new focal deficits. Ext: Warm, no deformities. R knee nontender without effusion or palpable abnormality. R calf tender to compression, not significantly edematous, intact cap refill.  Skin: No rashes, lesions or ulcers on visualized skin   Data Reviewed: Troponin 3 > 3 WBC 11.2k, hgb 15.3, plt 307k SCr 1.02  Pulmonary emboli are present within the right pulmonary artery branches, most extensive in the lower lobe. There may be a few tiny emboli on the left. Right ventricular to left ventricular ratio is 1.09.  Assessment and Plan: Acute PE with RV strain by CT, provoked RLE distal DVTs:  - Start IV heparin per pharmacy. Reportedly took eliquis as directed. Unclear whether this constitutes a failure of DOAC. Will seek hematology recommendations for anticoagulation going forward. Pt is reluctant to  use injectable medication.  - Pt has stable vital signs and negative troponin. Will plan observation with telemetry unless any vital sign instability develops.  - Echocardiogram pending.   Seronegative spondyloarthropathy:  - Continue sulfasalazine. This can be associated with dyscrasias and hemolytic anemia (neither noted in this case), not clots.  Tobacco use:  - Cessation counseling    Advance Care Planning: Full confirmed with patient at admission  Consults: Hematology, Dr. Ellin Saba  Family Communication: None at bedside  Severity of Illness: The appropriate patient status for this patient is OBSERVATION. Observation status is judged to be reasonable and necessary in order to provide the required intensity of service to ensure the patient's safety. The patient's presenting symptoms, physical exam findings, and initial radiographic and laboratory data in the context of their medical condition is felt to place them at decreased risk for further clinical deterioration. Furthermore, it is  anticipated that the patient will be medically stable for discharge from the hospital within 2 midnights of admission.   Author: Tyrone Nine, MD 08/12/2022 3:56 PM  For on call review [email or web address].

## 2022-08-12 NOTE — ED Provider Notes (Signed)
[place] EMERGENCY DEPARTMENT AT [facility] Provider Note   CSN: [id number] Arrival date & time: 08/12/22  1045     History  Chief Complaint  Patient presents with   Chest Pain    Ruth Robertson is a 57 y.o. female.  She has been seen in the ED for times recently for pain behind her right knee.  She ultimately got a duplex showing an acute DVT involving the right peroneal right soleal and right gastroc.  She is now taking Eliquis 7/3 after seeing vascular surgery Dr. Myra Gianotti.  Today she noticed some sharp stabbing pain with deep breath behind her right shoulder blade when she woke up this morning.  She does not feel short of breath.  She denies any chest pain.  She has not seen any bleeding no cough.  No other complaints.  Called her PCP who recommended she come here for further evaluation.   Chest Pain Chest pain location: Right posterior chest. 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 Authorizing Provider  acetaminophen (TYLENOL) 500 MG tablet Take 500 mg by mouth every 6 (six) hours as needed for mild pain or moderate pain.    [provider]  apixaban (ELIQUIS) 5 MG TABS tablet Take 1 tablet (5 mg total) by mouth 2 (two) times daily. Start taking after completion of starter pack. 08/07/22   Pervis Hocking B, RPH-CPP  APIXABAN Everlene Balls) VTE STARTER PACK (10MG  AND 5MG ) Take as directed on package: start with two-5mg  tablets by mouth twice daily for  7 days. On day 8, switch to one-5mg  tablet twice daily. 08/07/22   Pervis Hocking B, RPH-CPP  ondansetron (ZOFRAN) 8 MG tablet Take 1 tablet (8 mg total) by mouth every 8 (eight) hours as needed for nausea. 01/24/21   Babs Sciara, MD  sulfaSALAzine (AZULFIDINE) 500 MG EC tablet Take 2 tablets (1,000 mg total) by mouth 2 (two) times daily with food 07/05/21     sulfaSALAzine (AZULFIDINE) 500 MG EC tablet Take 2 tablets (1,000 mg total) by mouth 2 (two) times daily. Take with food 12/13/21         Allergies    Patient has no known allergies.    Review of Systems   Review of Systems  Constitutional:  Negative for diaphoresis and fever.  Respiratory:  Negative for cough and shortness of breath.   Cardiovascular:  Positive for chest pain.  Gastrointestinal:  Negative for abdominal pain, nausea and vomiting.    Physical Exam Updated Vital Signs BP (!) 132/95 (BP Location: Right Arm)   Pulse 84   Temp 98.3 F (36.8 C)   Resp 18   Ht 5\' 5"  (1.651 m)   Wt 82.6 kg   LMP 06/12/2012   SpO2 94%   BMI 30.29 kg/m  Physical Exam Vitals and nursing note reviewed.  Constitutional:      General: She is not in acute distress.    Appearance: Normal appearance. She is well-developed.  HENT:     Head: Normocephalic and atraumatic.  Eyes:     Conjunctiva/sclera: Conjunctivae normal.  Cardiovascular:     Rate and Rhythm: Normal rate and regular rhythm.     Heart sounds: Normal heart sounds. No murmur heard. Pulmonary:     Effort: Pulmonary effort is normal. No respiratory distress.     Breath sounds: Normal breath sounds.  Abdominal:     Palpations: Abdomen is soft.     Tenderness: There is no abdominal tenderness.  Musculoskeletal:        General: No deformity. Normal range of motion.     Cervical back: Neck supple.  Skin:    General: Skin is warm and dry.     Capillary Refill: Capillary refill takes less than 2 seconds.  Neurological:     General: No focal deficit present.     Mental  Status: She is alert.     Sensory: No sensory deficit.     Motor: No weakness.     ED Results / Procedures / Treatments   Labs (all labs ordered are listed, but only abnormal results are displayed) Labs Reviewed  BASIC METABOLIC PANEL - Abnormal; Notable for the following components:      Result Value   Creatinine, Ser 1.02 (*)    All other components within normal limits  CBC - Abnormal; Notable for the following components:   WBC 11.2 (*)    Hemoglobin 15.3 (*)    HCT 47.4 (*)    All other components within normal limits  PROTIME-INR - Abnormal; Notable for the following components:   Prothrombin Time 16.7 (*)    INR 1.3 (*)    All other components within normal limits  D-DIMER, QUANTITATIVE - Abnormal; Notable for the following components:   D-Dimer, Quant 2.86 (*)    All other components within normal limits  HEPARIN LEVEL (UNFRACTIONATED) - Abnormal; Notable for the following components:   Heparin Unfractionated >1.10 (*)    All other components within normal limits  APTT  APTT  HIV ANTIBODY (ROUTINE TESTING W REFLEX)  APTT  HEPARIN LEVEL (UNFRACTIONATED)  CBC  BASIC METABOLIC PANEL  TROPONIN I (HIGH SENSITIVITY)  TROPONIN I (HIGH SENSITIVITY)    EKG EKG Interpretation Date/Time:  Monday August 12 2022 11:44:20 EDT Ventricular Rate:  87 PR Interval:  126 QRS Duration:  62 QT Interval:  354 QTC Calculation: 425 R Axis:   63  Text Interpretation: Normal sinus rhythm Normal ECG When compared with ECG of 28-Dec-2020 22:21, No significant change since last tracing Confirmed by Meridee Score [phone]) on 08/12/2022 11:57:09 AM  Radiology ECHOCARDIOGRAM COMPLETE  Result Date: 08/12/2022    ECHOCARDIOGRAM REPORT   Patient Name:   Ruth Robertson Date of Exam: 08/12/2022 Medical Rec #:  [id number]                Height:       65.0 in Accession #:    [id number]               Weight:       182.0 lb Date of Birth:  Aug 25, 1965                BSA:          1.900 m  Patient Age:    56 years                 BP:           127/94 mmHg Patient  Gender: F                        HR:           72 bpm. [facility] Procedure: 2D Echo, Cardiac Doppler and Color Doppler Indications:    Pulmonary embolus  History:        Patient has prior history of Echocardiogram examinations, most                 recent 03/04/2021.  Sonographer:    Milda Smart Referring Phys: [id number] [address] Nine IMPRESSIONS  1. Left ventricular ejection fraction, by estimation, is 60 to 65%. The left ventricle has normal function. The left ventricle has no regional wall motion abnormalities. Left ventricular diastolic parameters were normal.  2. Right ventricular systolic function is normal. The right ventricular size is normal. Tricuspid regurgitation signal is inadequate for assessing PA pressure.  3. The mitral valve is grossly normal. Trivial mitral valve regurgitation.  4. The aortic valve is tricuspid. Aortic valve regurgitation is not visualized.  5. The inferior vena cava is normal in size with greater than 50% respiratory variability, suggesting right atrial pressure of 3 mmHg. Comparison(s): Prior images unable to be directly viewed. FINDINGS  Left Ventricle: Left ventricular ejection fraction, by estimation, is 60 to 65%. The left ventricle has normal function. The left ventricle has no regional wall motion abnormalities. The left ventricular internal cavity size was normal in size. There is  no left ventricular hypertrophy. Left ventricular diastolic parameters were normal. Right Ventricle: The right ventricular size is normal. No increase in right ventricular wall thickness. Right ventricular systolic function is normal. Tricuspid regurgitation signal is inadequate for assessing PA pressure. Left Atrium: Left atrial size was normal in size. Right Atrium: Right atrial size was normal in size. Pericardium: There is no evidence of pericardial effusion. Mitral Valve: The mitral valve is grossly  normal. Trivial mitral valve regurgitation. MV peak gradient, 3.8 mmHg. The mean mitral valve gradient is 2.0 mmHg. Tricuspid Valve: The tricuspid valve is grossly normal. Tricuspid valve regurgitation is trivial. Aortic Valve: The aortic valve is tricuspid. There is mild aortic valve annular calcification. Aortic valve regurgitation is not visualized. Pulmonic Valve: The pulmonic valve was grossly normal. Pulmonic valve regurgitation is trivial. Aorta: The aortic root is normal in size and structure. Venous: The inferior vena cava is normal in size with greater than 50% respiratory variability, suggesting right atrial pressure of 3 mmHg. IAS/Shunts: No atrial level shunt detected by color flow Doppler.  LEFT VENTRICLE PLAX 2D LVIDd:         4.30 cm     Diastology LVIDs:         2.80 cm     LV e' medial:    9.46 cm/s LV PW:         0.70 cm     LV E/e' medial:  7.2 LV IVS:        0.80 cm     LV e' lateral:   8.38 cm/s LVOT diam:     1.90 cm     LV E/e' lateral: 8.1 LV SV:         69 LV SV Index:   37 LVOT Area:     2.84 cm  LV Volumes (MOD) LV vol d, MOD A2C: 34.0 ml LV vol d, MOD A4C: 56.8 ml LV vol s, MOD A2C: 12.1 ml LV vol s, MOD A4C: 21.9 ml  LV SV MOD A2C:     21.9 ml LV SV MOD A4C:     56.8 ml LV SV MOD BP:      29.0 ml RIGHT VENTRICLE             IVC RV S prime:     12.50 cm/s  IVC diam: 1.20 cm TAPSE (M-mode): 2.3 cm LEFT ATRIUM             Index        RIGHT ATRIUM          Index LA diam:        2.70 cm 1.42 cm/m   RA Area:     9.72 cm LA Vol (A2C):   21.3 ml 11.23 ml/m  RA Volume:   18.80 ml 9.89 ml/m LA Vol (A4C):   27.5 ml 14.47 ml/m LA Biplane Vol: 31.4 ml 16.52 ml/m  AORTIC VALVE LVOT Vmax:   123.00 cm/s LVOT Vmean:  86.300 cm/s LVOT VTI:    0.245 m  AORTA Ao Root diam: 2.40 cm Ao Asc diam:  3.00 cm MITRAL VALVE MV Area (PHT): 2.94 cm    SHUNTS MV Area VTI:   2.74 cm    Systemic VTI:  0.24 m MV Peak grad:  3.8 mmHg    Systemic Diam: 1.90 cm MV Mean grad:  2.0 mmHg MV Vmax:       0.97 m/s MV  Vmean:      63.1 cm/s MV Decel Time: 258 msec MV E velocity: 67.70 cm/s MV A velocity: 87.00 cm/s MV E/A ratio:  0.78 Nona Dell MD Electronically signed by Nona Dell MD Signature Date/Time: 08/12/2022/5:05:54 PM    Final    CT Angio Chest PE W/Cm &/Or Wo Cm  Result Date: 08/12/2022 CLINICAL DATA:  Pulmonary embolism suspected. High probability. Right-sided chest pain. EXAM: CT ANGIOGRAPHY CHEST WITH CONTRAST TECHNIQUE: Multidetector CT imaging of the chest was performed using the standard protocol during bolus administration of intravenous contrast. Multiplanar CT image reconstructions and MIPs were obtained to evaluate the vascular anatomy. RADIATION DOSE REDUCTION: This exam was performed according to the departmental dose-optimization program which includes automated exposure control, adjustment of the mA and/or kV according to patient size and/or use of iterative reconstruction technique. CONTRAST:  75mL OMNIPAQUE IOHEXOL 350 MG/ML SOLN COMPARISON:  Chest radiography same day FINDINGS: Cardiovascular: Overall heart size is normal. No visible coronary artery calcification. Very minimal aortic atherosclerotic calcification. Pulmonary arterial opacification is good. Pulmonary emboli are present within the right pulmonary artery branches, most extensive in the lower lobe. There may be a few tiny emboli on the left. Right ventricular to left ventricular ratio is 1.09. Mediastinum/Nodes: No mass or lymphadenopathy. Lungs/Pleura: Patchy density in the superior segment of the right lower lobe that could be infiltrate or infarction. Mild atelectasis otherwise of both lung bases. No pleural effusion. Upper Abdomen: Negative Musculoskeletal: No significant finding. Review of the MIP images confirms the above findings. IMPRESSION: 1. Positive for acute PE with CT evidence of right heart strain (RV/LV Ratio = 1.09) consistent with at least submassive (intermediate risk) PE. The presence of right heart strain has  been associated with an increased risk of morbidity and mortality. Please refer to the "Code PE Focused" order set in EPIC. 2. Patchy density in the superior segment of the right lower lobe that could be infiltrate or infarction. Mild atelectasis otherwise of both lung bases. 3. These results were called by telephone at the time of interpretation on  08/12/2022 at 1:18 pm to provider [facility] , who verbally acknowledged these results. Aortic Atherosclerosis (ICD10-I70.0). Electronically Signed   By: Paulina Fusi M.D.   On: 08/12/2022 13:19   DG Chest 2 View  Result Date: 08/12/2022 CLINICAL DATA:  CP EXAM: CHEST - 2 VIEW COMPARISON:  CXR 02/28/21 FINDINGS: No pleural effusion. No pneumothorax. Unchanged cardiac and mediastinal contours. Linear opacity at the left base most likely represent atelectasis. No radiographically apparent displaced rib fractures. Visualized upper abdomen is unremarkable. Vertebral body heights are maintained. IMPRESSION: Likely left basilar atelectasis. Electronically Signed   By: Lorenza Cambridge M.D.   On: 08/12/2022 12:34    Procedures .Critical Care  Performed by: Terrilee Files, MD Authorized by: Terrilee Files, MD   Critical care provider statement:    Critical care time (minutes):  45   Critical care time was exclusive of:  Separately billable procedures and treating other patients   Critical care was necessary to treat or prevent imminent or life-threatening deterioration of the following conditions:  Respiratory failure   Critical care was time spent personally by me on the following activities:  Development of treatment plan with patient or surrogate, discussions with consultants, evaluation of patient's response to treatment, examination of patient, obtaining history from patient or surrogate, ordering and performing treatments and interventions, ordering and review of laboratory studies, ordering and review of radiographic studies, pulse oximetry,  re-evaluation of patient's condition and review of old charts   I assumed direction of critical care for this patient from another provider in my specialty: no       Medications Ordered in ED Medications  heparin ADULT infusion 100 units/mL (25000 units/233mL) (1,300 Units/hr Intravenous New Bag/Given 08/12/22 1552)  acetaminophen (TYLENOL) tablet 500 mg (has no administration in time range)  ondansetron (ZOFRAN) tablet 8 mg (has no administration in time range)  sulfaSALAzine (AZULFIDINE) EC tablet 1,000 mg (has no administration in time range)  sodium chloride flush (NS) 0.9 % injection 3 mL (has no administration in time range)  nicotine (NICODERM CQ - dosed in mg/24 hours) patch 21 mg (has no administration in time range)  iohexol (OMNIPAQUE) 350 MG/ML injection 75 mL (75 mLs Intravenous Contrast Given 08/12/22 1302)  heparin bolus via infusion 2,000 Units (2,000 Units Intravenous Bolus from Bag 08/12/22 1552)    ED Course/ Medical Decision Making/ A&P Clinical Course as of 08/12/22 1752  Mon Aug 12, 2022  1243 Chest x-ray interpreted by me as no gross infiltrate.  Awaiting radiology reading. [MB]  1323 Seems to call from radiology.  Patient has a submassive PE.  She is not hemodynamically unstable. [MB]    Clinical Course User Index [MB] Terrilee Files, MD                             Medical Decision Making Amount and/or Complexity of Data Reviewed Labs: ordered. Radiology: ordered.  Risk OTC drugs. Prescription drug management. Decision regarding hospitalization.   This patient complains of right-sided pleuritic chest pain; this involves an extensive number of treatment Options and is a complaint that carries with it a high risk of complications and morbidity. The differential includes PE, musculoskeletal, pneumothorax, pneumonia  I ordered, reviewed and interpreted labs, which included CBC with mildly low white count stable hemoglobin, chemistries normal, D-dimer  elevated, troponin normal I ordered medication IV heparin and reviewed PMP when indicated. I ordered imaging studies which included chest x-ray and angio  chest and I independently    visualized and interpreted imaging which showed large right-sided PE Additional history obtained from patient's mother Previous records obtained and reviewed in epic including recent ED visits and vascular note I consulted critical care Dr. Merrily Pew and [facility] hospitalist Dr. Jarvis Newcomer and discussed lab and imaging findings and discussed disposition.  Critical care felt like the patient would benefit from admission to the hospital and start on heparin drip.  He did not feel she was going to need any acute intervention.  Did not necessitate transfer to [facility] at this time. Cardiac monitoring reviewed, normal sinus rhythm Social determinants considered, tobacco use Critical Interventions: Initiation of IV heparin for submassive PE  After the interventions stated above, I reevaluated the patient and found patient to be hemodynamically stable oxygenating well on room air Admission and further testing considered, she would benefit from mission to the hospital for further workup.  Unclear if this represents a failure of Eliquis or the fact that it just did not get started in time to prevent this.  Likely they will need hematology input on this.         Final Clinical Impression(s) / ED Diagnoses Final diagnoses:  Other acute pulmonary embolism without acute cor pulmonale (HCC)    Rx / DC Orders ED Discharge Orders     None         Terrilee Files, MD 08/12/22 1756

## 2022-08-12 NOTE — Progress Notes (Signed)
Spoke with pharmacy.  Received okay to give Coumadin after blood work result.  Blood work will be redrawn tomorrow to monitor levels.

## 2022-08-12 NOTE — ED Notes (Signed)
Echo at bedside

## 2022-08-13 ENCOUNTER — Other Ambulatory Visit (HOSPITAL_COMMUNITY): Payer: Self-pay

## 2022-08-13 DIAGNOSIS — Z86718 Personal history of other venous thrombosis and embolism: Secondary | ICD-10-CM | POA: Diagnosis not present

## 2022-08-13 DIAGNOSIS — Z7901 Long term (current) use of anticoagulants: Secondary | ICD-10-CM | POA: Diagnosis not present

## 2022-08-13 DIAGNOSIS — F411 Generalized anxiety disorder: Secondary | ICD-10-CM | POA: Diagnosis not present

## 2022-08-13 DIAGNOSIS — F17210 Nicotine dependence, cigarettes, uncomplicated: Secondary | ICD-10-CM | POA: Diagnosis not present

## 2022-08-13 DIAGNOSIS — Z823 Family history of stroke: Secondary | ICD-10-CM | POA: Diagnosis not present

## 2022-08-13 DIAGNOSIS — K219 Gastro-esophageal reflux disease without esophagitis: Secondary | ICD-10-CM | POA: Diagnosis not present

## 2022-08-13 DIAGNOSIS — Z833 Family history of diabetes mellitus: Secondary | ICD-10-CM | POA: Diagnosis not present

## 2022-08-13 DIAGNOSIS — M479 Spondylosis, unspecified: Secondary | ICD-10-CM | POA: Diagnosis not present

## 2022-08-13 DIAGNOSIS — Z8249 Family history of ischemic heart disease and other diseases of the circulatory system: Secondary | ICD-10-CM | POA: Diagnosis not present

## 2022-08-13 DIAGNOSIS — I2699 Other pulmonary embolism without acute cor pulmonale: Secondary | ICD-10-CM | POA: Diagnosis not present

## 2022-08-13 DIAGNOSIS — F32A Depression, unspecified: Secondary | ICD-10-CM | POA: Diagnosis not present

## 2022-08-13 LAB — BASIC METABOLIC PANEL
Anion gap: 10 (ref 5–15)
BUN: 16 mg/dL (ref 6–20)
CO2: 23 mmol/L (ref 22–32)
Calcium: 8.9 mg/dL (ref 8.9–10.3)
Chloride: 104 mmol/L (ref 98–111)
Creatinine, Ser: 1.01 mg/dL — ABNORMAL HIGH (ref 0.44–1.00)
GFR, Estimated: >60 mL/min (ref >60)
Glucose, Bld: 122 mg/dL — ABNORMAL HIGH (ref 70–99)
Potassium: 3.9 mmol/L (ref 3.5–5.1)
Sodium: 137 mmol/L (ref 135–145)

## 2022-08-13 LAB — PROTIME-INR
INR: 1.1 (ref 0.8–1.2)
Prothrombin Time: 14.2 seconds (ref 11.4–15.2)

## 2022-08-13 LAB — HEPARIN LEVEL (UNFRACTIONATED): Heparin Unfractionated: >1.1 IU/mL — ABNORMAL HIGH (ref 0.30–0.70)

## 2022-08-13 LAB — APTT: aPTT: 96 seconds — ABNORMAL HIGH (ref 24–36)

## 2022-08-13 LAB — HIV ANTIBODY (ROUTINE TESTING W REFLEX): HIV Screen 4th Generation wRfx: NONREACTIVE

## 2022-08-13 MED ORDER — WARFARIN SODIUM 5 MG PO TABS
5.0000 mg | ORAL_TABLET | Freq: Every day | ORAL | 0 refills | Status: DC
  Filled 2022-08-13: qty 30, 30d supply, fill #0

## 2022-08-13 MED ORDER — ENOXAPARIN SODIUM 80 MG/0.8ML IJ SOSY
80.0000 mg | PREFILLED_SYRINGE | Freq: Two times a day (BID) | INTRAMUSCULAR | Status: DC
  Administered 2022-08-13: 80 mg via SUBCUTANEOUS
  Filled 2022-08-13: qty 0.8

## 2022-08-13 MED ORDER — WARFARIN SODIUM 5 MG PO TABS: 5.0000 mg | ORAL_TABLET | Freq: Every day | ORAL | 0 refills | Status: DC

## 2022-08-13 MED ORDER — ENOXAPARIN SODIUM 80 MG/0.8ML IJ SOSY
80.0000 mg | PREFILLED_SYRINGE | Freq: Two times a day (BID) | INTRAMUSCULAR | 0 refills | Status: DC
  Filled 2022-08-13: qty 8, 5d supply, fill #0

## 2022-08-13 MED ORDER — WARFARIN SODIUM 7.5 MG PO TABS
7.5000 mg | ORAL_TABLET | Freq: Once | ORAL | Status: AC
  Administered 2022-08-13: 7.5 mg via ORAL
  Filled 2022-08-13: qty 1

## 2022-08-13 MED ORDER — ENOXAPARIN SODIUM 80 MG/0.8ML IJ SOSY: 80.0000 mg | PREFILLED_SYRINGE | Freq: Two times a day (BID) | INTRAMUSCULAR | 0 refills | Status: DC

## 2022-08-13 NOTE — Discharge Summary (Signed)
Physician Discharge Summary   Patient: NATESSA EXUM MRN: [id number] DOB: 10-30-1965  Admit date:     08/12/2022  Discharge date: 08/13/22  Discharge Physician: Tyrone Nine   PCP: Babs Sciara, MD   Recommendations at discharge:  Follow up with Dr. Ellin Saba, hematology, on Friday 7/12, for PT/INR check. We've changed eliquis to coumadin due to acute PE despite initiating eliquis for DVT. She will take lovenox bridge while starting coumadin.  Discharge Diagnoses: Principal Problem:   Acute pulmonary embolism (HCC) Active Problems:   Depression with anxiety   Tobacco abuse   Seronegative spondyloarthropathy  Hospital Course: SHERRILEE STEIGERWALD is a 57 y.o. 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 She had returned from a trip to the beach last weekend, noticed progressive swelling and pain to the right knee and later purple discoloration to the right lower leg prompting evaluation. Initial concern was meniscal tear, though negative arthrocentesis. An outpatient ultrasound on 7/3 revealed acute DVT involving right peroneal, soleal, and gastrocnemius veins. Full compressibility is noted in proximal veins. She initiated loading dose eliquis that day and has not missed a dose since. She woke up this morning with the pleuritic pain in right shoulder/upper back. She denies shortness of breath, anterior chest pain, exertional component to symptoms. She's never had a blood clot before, though her son had a provoked clot. She's had "GI issues" requiring admission at times, but never diagnosed with IBD/Crohn's/UC. No other new medications including estrogens.    Denies any GI bleeding, hematuria, nose bleeds since starting eliquis. No vomiting. She has always bruised easily but no change lately, no large bruising currently. +smoker.    Work up in the ED included CTA  chest showing pulmonary emboli in right pulmonary artery branches with suspected infarct in superior RLL, possible emboli on left as well. She has been afebrile with normal HR and normal BP, no hypoxemia, nonlabored respirations. Troponin normal x2. IV heparin was started and admission was requested.   She has continued to be stable and will be discharged. See details below.  Assessment and Plan: Acute PE with RV strain by CT, provoked RLE distal DVTs:  - Start lovenox 80mg  q12h and coumadin 7.5mg  tonight and 5mg  daily thereafter. Follow up with Dr. Kirtland Bouchard Friday. Sent to pt's pharmacy but they're out of stock, called prescriptions into [facility] (pt's other pharmacy) after confirming availability there. Injectable training provided.  - Pt has stable vital signs and negative troponin. No bleeding - Echocardiogram showed normal size and function of RV   Seronegative spondyloarthropathy:  - Continue sulfasalazine. This can be associated with dyscrasias and hemolytic anemia (neither noted in this case), not clots. It may interact with coumadin.    Tobacco use:  - Cessation counseling  Consultants: Hematology Procedures performed: None  Disposition: Home Diet recommendation: Regular DISCHARGE MEDICATION: Allergies as of 08/13/2022   No Known Allergies      Medication List     STOP taking these medications    apixaban 5 MG Tabs tablet Commonly known as: Eliquis   Eliquis DVT/PE Starter Pack Generic drug: Apixaban Starter Pack (10mg  and 5mg )       TAKE these medications    acetaminophen 500 MG tablet Commonly known as: TYLENOL Take 500 mg by mouth every 6 (six) hours as needed for mild pain or moderate pain.   enoxaparin 80 MG/0.8ML injection Commonly known  as: LOVENOX Inject 0.8 mLs (80 mg total) into the skin every 12 (twelve) hours for 5 days.   ondansetron 8 MG tablet Commonly known as: Zofran Take 1 tablet (8 mg total) by mouth every 8 (eight) hours as needed for  nausea.   sulfaSALAzine 500 MG EC tablet Commonly known as: AZULFIDINE Take 2 tablets (1,000 mg total) by mouth 2 (two) times daily with food What changed: Another medication with the same name was removed. Continue taking this medication, and follow the directions you see here.   warfarin 5 MG tablet Commonly known as: COUMADIN Take 1 tablet (5 mg total) by mouth daily at 4 PM.        Follow-up Information     Luking, Jonna Coup, MD Follow up.   Specialty: Family Medicine Contact information: [address] [place] [address] [phone]         Doreatha Massed, MD Follow up.   Specialty: Hematology Contact information: [address] [place] [address] [phone]                Discharge Exam: Ceasar Mons Weights   08/12/22 1140  Weight: 82.6 kg  BP 127/86 (BP Location: Right Arm)   Pulse 71   Temp 97.9 F (36.6 C) (Oral)   Resp (!) 24   Ht 5\' 5"  (1.651 m)   Wt 82.6 kg   LMP 06/12/2012   SpO2 93%   BMI 30.29 kg/m   No distress Clear, nonlabored on room air, RR 20/min RRR, no MRG or heave or edema  Condition at discharge: stable  The results of significant diagnostics from this hospitalization (including imaging, microbiology, ancillary and laboratory) are listed below for reference.   Imaging Studies: ECHOCARDIOGRAM COMPLETE  Result Date: 08/12/2022    ECHOCARDIOGRAM REPORT   Patient Name:   LASHAWNE ROWLEE Date of Exam: 08/12/2022 Medical Rec #:  [id number]                Height:       65.0 in Accession #:    [id number]               Weight:       182.0 lb Date of Birth:  Feb 16, 1965                BSA:          1.900 m Patient Age:    56 years                 BP:           127/94 mmHg Patient Gender: F                        HR:           72 bpm. [facility] Procedure: 2D Echo, Cardiac Doppler and Color Doppler Indications:    Pulmonary embolus  History:        Patient has prior history of Echocardiogram  examinations, most                 recent 03/04/2021.  Sonographer:    Milda Smart Referring Phys: [id number] Tyrone Nine IMPRESSIONS  1. Left ventricular ejection fraction, by estimation, is 60 to 65%. The left ventricle has normal function. The left ventricle has no regional wall motion abnormalities. Left ventricular diastolic parameters were normal.  2. Right ventricular systolic function is normal. The right ventricular  size is normal. Tricuspid regurgitation signal is inadequate for assessing PA pressure.  3. The mitral valve is grossly normal. Trivial mitral valve regurgitation.  4. The aortic valve is tricuspid. Aortic valve regurgitation is not visualized.  5. The inferior vena cava is normal in size with greater than 50% respiratory variability, suggesting right atrial pressure of 3 mmHg. Comparison(s): Prior images unable to be directly viewed. FINDINGS  Left Ventricle: Left ventricular ejection fraction, by estimation, is 60 to 65%. The left ventricle has normal function. The left ventricle has no regional wall motion abnormalities. The left ventricular internal cavity size was normal in size. There is  no left ventricular hypertrophy. Left ventricular diastolic parameters were normal. Right Ventricle: The right ventricular size is normal. No increase in right ventricular wall thickness. Right ventricular systolic function is normal. Tricuspid regurgitation signal is inadequate for assessing PA pressure. Left Atrium: Left atrial size was normal in size. Right Atrium: Right atrial size was normal in size. Pericardium: There is no evidence of pericardial effusion. Mitral Valve: The mitral valve is grossly normal. Trivial mitral valve regurgitation. MV peak gradient, 3.8 mmHg. The mean mitral valve gradient is 2.0 mmHg. Tricuspid Valve: The tricuspid valve is grossly normal. Tricuspid valve regurgitation is trivial. Aortic Valve: The aortic valve is tricuspid. There is mild aortic valve annular calcification.  Aortic valve regurgitation is not visualized. Pulmonic Valve: The pulmonic valve was grossly normal. Pulmonic valve regurgitation is trivial. Aorta: The aortic root is normal in size and structure. Venous: The inferior vena cava is normal in size with greater than 50% respiratory variability, suggesting right atrial pressure of 3 mmHg. IAS/Shunts: No atrial level shunt detected by color flow Doppler.  LEFT VENTRICLE PLAX 2D LVIDd:         4.30 cm     Diastology LVIDs:         2.80 cm     LV e' medial:    9.46 cm/s LV PW:         0.70 cm     LV E/e' medial:  7.2 LV IVS:        0.80 cm     LV e' lateral:   8.38 cm/s LVOT diam:     1.90 cm     LV E/e' lateral: 8.1 LV SV:         69 LV SV Index:   37 LVOT Area:     2.84 cm  LV Volumes (MOD) LV vol d, MOD A2C: 34.0 ml LV vol d, MOD A4C: 56.8 ml LV vol s, MOD A2C: 12.1 ml LV vol s, MOD A4C: 21.9 ml LV SV MOD A2C:     21.9 ml LV SV MOD A4C:     56.8 ml LV SV MOD BP:      29.0 ml RIGHT VENTRICLE             IVC RV S prime:     12.50 cm/s  IVC diam: 1.20 cm TAPSE (M-mode): 2.3 cm LEFT ATRIUM             Index        RIGHT ATRIUM          Index LA diam:        2.70 cm 1.42 cm/m   RA Area:     9.72 cm LA Vol (A2C):   21.3 ml 11.23 ml/m  RA Volume:   18.80 ml 9.89 ml/m LA Vol (A4C):   27.5 ml 14.47 ml/m LA Biplane  Vol: 31.4 ml 16.52 ml/m  AORTIC VALVE LVOT Vmax:   123.00 cm/s LVOT Vmean:  86.300 cm/s LVOT VTI:    0.245 m  AORTA Ao Root diam: 2.40 cm Ao Asc diam:  3.00 cm MITRAL VALVE MV Area (PHT): 2.94 cm    SHUNTS MV Area VTI:   2.74 cm    Systemic VTI:  0.24 m MV Peak grad:  3.8 mmHg    Systemic Diam: 1.90 cm MV Mean grad:  2.0 mmHg MV Vmax:       0.97 m/s MV Vmean:      63.1 cm/s MV Decel Time: 258 msec MV E velocity: 67.70 cm/s MV A velocity: 87.00 cm/s MV E/A ratio:  0.78 Nona Dell MD Electronically signed by Nona Dell MD Signature Date/Time: 08/12/2022/5:05:54 PM    Final    CT Angio Chest PE W/Cm &/Or Wo Cm  Result Date: 08/12/2022 CLINICAL DATA:   Pulmonary embolism suspected. High probability. Right-sided chest pain. EXAM: CT ANGIOGRAPHY CHEST WITH CONTRAST TECHNIQUE: Multidetector CT imaging of the chest was performed using the standard protocol during bolus administration of intravenous contrast. Multiplanar CT image reconstructions and MIPs were obtained to evaluate the vascular anatomy. RADIATION DOSE REDUCTION: This exam was performed according to the departmental dose-optimization program which includes automated exposure control, adjustment of the mA and/or kV according to patient size and/or use of iterative reconstruction technique. CONTRAST:  75mL OMNIPAQUE IOHEXOL 350 MG/ML SOLN COMPARISON:  Chest radiography same day FINDINGS: Cardiovascular: Overall heart size is normal. No visible coronary artery calcification. Very minimal aortic atherosclerotic calcification. Pulmonary arterial opacification is good. Pulmonary emboli are present within the right pulmonary artery branches, most extensive in the lower lobe. There may be a few tiny emboli on the left. Right ventricular to left ventricular ratio is 1.09. Mediastinum/Nodes: No mass or lymphadenopathy. Lungs/Pleura: Patchy density in the superior segment of the right lower lobe that could be infiltrate or infarction. Mild atelectasis otherwise of both lung bases. No pleural effusion. Upper Abdomen: Negative Musculoskeletal: No significant finding. Review of the MIP images confirms the above findings. IMPRESSION: 1. Positive for acute PE with CT evidence of right heart strain (RV/LV Ratio = 1.09) consistent with at least submassive (intermediate risk) PE. The presence of right heart strain has been associated with an increased risk of morbidity and mortality. Please refer to the "Code PE Focused" order set in EPIC. 2. Patchy density in the superior segment of the right lower lobe that could be infiltrate or infarction. Mild atelectasis otherwise of both lung bases. 3. These results were called by  telephone at the time of interpretation on 08/12/2022 at 1:18 pm to provider [facility] , who verbally acknowledged these results. Aortic Atherosclerosis (ICD10-I70.0). Electronically Signed   By: Paulina Fusi M.D.   On: 08/12/2022 13:19   DG Chest 2 View  Result Date: 08/12/2022 CLINICAL DATA:  CP EXAM: CHEST - 2 VIEW COMPARISON:  CXR 02/28/21 FINDINGS: No pleural effusion. No pneumothorax. Unchanged cardiac and mediastinal contours. Linear opacity at the left base most likely represent atelectasis. No radiographically apparent displaced rib fractures. Visualized upper abdomen is unremarkable. Vertebral body heights are maintained. IMPRESSION: Likely left basilar atelectasis. Electronically Signed   By: Lorenza Cambridge M.D.   On: 08/12/2022 12:34   LE VENOUS  Result Date: 08/07/2022  Lower Venous DVT Study Patient Name:  KANECIA CHICK  Date of Exam:   08/07/2022 Medical Rec #: [id number]  Accession #:    [id number] Date of Birth: 19-Sep-1965                 Patient Gender: F Patient Age:   40 years Exam Location:  [facility] Procedure:      VAS [place] LOWER EXTREMITY VENOUS (DVT) Referring Phys: Molly Maduro BROWNING --------------------------------------------------------------------------------  Indications: Swelling, and Pain.  Risk Factors: Recent extended travel. Comparison Study: No previous study. Performing Technologist: McKayla Maag RVT, [place]  Examination Guidelines: A complete evaluation includes B-mode imaging, spectral Doppler, color Doppler, and power Doppler as needed of all accessible portions of each vessel. Bilateral testing is considered an integral part of a complete examination. Limited examinations for reoccurring indications may be performed as noted. The reflux portion of the exam is performed with the patient in reverse Trendelenburg.  +---------+---------------+---------+-----------+----------+--------------+ RIGHT     CompressibilityPhasicitySpontaneityPropertiesThrombus Aging +---------+---------------+---------+-----------+----------+--------------+ CFV      Full           Yes      Yes                                 +---------+---------------+---------+-----------+----------+--------------+ SFJ      Full                                                        +---------+---------------+---------+-----------+----------+--------------+ FV Prox  Full                                                        +---------+---------------+---------+-----------+----------+--------------+ FV Mid   Full                                                        +---------+---------------+---------+-----------+----------+--------------+ FV DistalFull                                                        +---------+---------------+---------+-----------+----------+--------------+ PFV      Full                                                        +---------+---------------+---------+-----------+----------+--------------+ POP      Full           Yes      Yes                                 +---------+---------------+---------+-----------+----------+--------------+ PTV      Full                                                        +---------+---------------+---------+-----------+----------+--------------+  PERO     Partial        No       No                   Acute          +---------+---------------+---------+-----------+----------+--------------+ Soleal   None           No       No                   Acute          +---------+---------------+---------+-----------+----------+--------------+ Gastroc  None           No       No                   Acute          +---------+---------------+---------+-----------+----------+--------------+   +----+---------------+---------+-----------+----------+--------------+  LEFTCompressibilityPhasicitySpontaneityPropertiesThrombus Aging +----+---------------+---------+-----------+----------+--------------+ CFV Full           Yes      Yes                                 +----+---------------+---------+-----------+----------+--------------+ SFJ Full                                                        +----+---------------+---------+-----------+----------+--------------+     Summary: RIGHT: - Findings consistent with acute deep vein thrombosis involving the right peroneal veins, right soleal veins, and right gastrocnemius veins. - No cystic structure found in the popliteal fossa.  LEFT: - No evidence of common femoral vein obstruction.  *See table(s) above for measurements and observations. Electronically signed by Coral Else MD on 08/07/2022 at 9:19:42 PM.    Final    DG Knee Complete 4 Views Right  Result Date: 08/05/2022 CLINICAL DATA:  Right knee pain. EXAM: RIGHT KNEE - COMPLETE 4+ VIEW COMPARISON:  None Available. FINDINGS: No evidence of fracture or dislocation. Mild medial tibiofemoral and patellofemoral joint space narrowing with small joint effusion. IMPRESSION: Mild medial tibiofemoral and patellofemoral arthritis with small joint effusion. Electronically Signed   By: Larose Hires D.O.   On: 08/05/2022 22:48   DG Knee Complete 4 Views Right  Result Date: 08/03/2022 CLINICAL DATA:  Knee pain EXAM: RIGHT KNEE - COMPLETE 4+ VIEW COMPARISON:  None Available. FINDINGS: No fracture or dislocation is seen. The joint spaces are preserved. Visualized soft tissues are within normal limits. No suprapatellar knee joint effusion. IMPRESSION: Negative. Electronically Signed   By: Charline Bills M.D.   On: 08/03/2022 20:19    Microbiology: Results for orders placed or performed in visit on 04/24/22  Culture, Group A Strep     Status: None   Collection Time: 04/24/22 12:00 AM   Specimen: Throat   Throat  Result Value Ref Range Status   Strep A  Culture Negative  Final    Comment:                             Reference Range: Negative    Labs: CBC: Recent Labs  Lab 08/12/22 1201  WBC 11.2*  HGB 15.3*  HCT 47.4*  MCV 95.2  PLT 307   Basic Metabolic Panel: Recent Labs  Lab 08/12/22 1201 08/13/22 0518  NA 138 137  K 3.9 3.9  CL 103 104  CO2 24 23  GLUCOSE 97 122*  BUN 19 16  CREATININE 1.02* 1.01*  CALCIUM 9.3 8.9   Liver Function Tests: No results for input(s): "AST", "ALT", "ALKPHOS", "BILITOT", "PROT", "ALBUMIN" in the last 168 hours. CBG: No results for input(s): "GLUCAP" in the last 168 hours.  Discharge time spent: greater than 30 minutes.  Signed: Tyrone Nine, MD [facility] 08/13/2022

## 2022-08-13 NOTE — Progress Notes (Addendum)
ANTICOAGULATION CONSULT NOTE - Initial Consult  Pharmacy Consult for heparin >> lovenox and warfarin Indication: pulmonary embolus  No Known Allergies  Patient Measurements: Height: 5\' 5"  (165.1 cm) Weight: 82.6 kg (182 lb) IBW/kg (Calculated) : 57 Heparin Dosing Weight: 74.6 kg  Vital Signs: Temp: 97.9 F (36.6 C) (07/09 0800) Temp Source: Oral (07/09 0800) BP: 127/86 (07/09 0800) Pulse Rate: 71 (07/09 0800)  Labs: Recent Labs    08/12/22 1201 08/12/22 1401 08/12/22 2056 08/13/22 0515 08/13/22 0518  HGB 15.3*  --   --   --   --   HCT 47.4*  --   --   --   --   PLT 307  --   --   --   --   APTT 32  --  77* 96*  --   LABPROT 16.7*  --  14.0  --  14.2  INR 1.3*  --  1.1  --  1.1  HEPARINUNFRC >1.10*  --   --   --  >1.10*  CREATININE 1.02*  --   --   --  1.01*  TROPONINIHS 3 3  --   --   --      Estimated Creatinine Clearance: 66 mL/min (A) (by C-G formula based on SCr of 1.01 mg/dL (H)).   Medical History: Past Medical History:  Diagnosis Date   Anxiety    Asthma    ONLY WITH BRONCHITIS   GAD (generalized anxiety disorder)    GERD (gastroesophageal reflux disease)    Microscopic hematuria 12/03/2017   Negative cystoscopy negative CAT scan   Pneumonia    PONV (postoperative nausea and vomiting)     Medications:  Medications Prior to Admission  Medication Sig Dispense Refill Last Dose   acetaminophen (TYLENOL) 500 MG tablet Take 500 mg by mouth every 6 (six) hours as needed for mild pain or moderate pain.      apixaban (ELIQUIS) 5 MG TABS tablet Take 1 tablet (5 mg total) by mouth 2 (two) times daily. Start taking after completion of starter pack. 60 tablet 5    APIXABAN (ELIQUIS) VTE STARTER PACK (10MG  AND 5MG ) Take as directed on package: start with two-5mg  tablets by mouth twice daily for 7 days. On day 8, switch to one-5mg  tablet twice daily. 74 each 0    ondansetron (ZOFRAN) 8 MG tablet Take 1 tablet (8 mg total) by mouth every 8 (eight) hours as needed  for nausea. 15 tablet 2    sulfaSALAzine (AZULFIDINE) 500 MG EC tablet Take 2 tablets (1,000 mg total) by mouth 2 (two) times daily with food 120 tablet 1    sulfaSALAzine (AZULFIDINE) 500 MG EC tablet Take 2 tablets (1,000 mg total) by mouth 2 (two) times daily. Take with food 120 tablet 5     Assessment: Pharmacy consulted to dose heparin in patient with pulmonary embolism.  Patient is on Eliquis prior to admission due to recently diagnosed DVT and started on treatment dosing Eliquis 7/3. Last dose of apixaban noted to be 7/8 ~0930.   Now transitioning from heparin to lovenox and warfarin.  INR 1.1 CBC WNL  Goal of Therapy:  Heparin level 0.3-0.7 units/ml aPTT 66-102 seconds Monitor platelets by anticoagulation protocol: Yes   Plan:  Stop heparin infusion Start lovenox 80 mg subcutaneous every 12 hours for 5 days and INR therapeutic for 24 hours. Warfarin 7.5 mg x 1 dose. Continue to monitor H&H and platelets. Daily INR   Judeth Cornfield, PharmD Clinical Pharmacist 08/13/2022 8:45  AM

## 2022-08-13 NOTE — Progress Notes (Signed)
C/O chest pain.  Vitals   127/86  71  93% RA  97.9 24 .  EKG completed and Dr. Jarvis Newcomer notified.

## 2022-08-13 NOTE — Progress Notes (Signed)
Patient has been stable.  Did had complaints of a headache, was medicated per order.  Vitals have been stable.  Patient still has shortness of breathe upon exertion.  No acute issues overnight.

## 2022-08-13 NOTE — TOC CM/SW Note (Signed)
Transition of Care [facility]) - Inpatient Brief Assessment   Patient Details  Name: AMENA THEESFELD MRN: [id number] Date of Birth: 25-Jun-1965  Transition of Care [facility]) CM/SW Contact:    Elliot Gault, LCSW Phone Number: 08/13/2022, 10:38 AM   Clinical Narrative:  Transition of Care Department [facility]) has reviewed patient and no TOC needs have been identified at this time. We will continue to monitor patient advancement through interdisciplinary progression rounds. If new patient transition needs arise, please place a TOC consult.  Transition of Care Asessment: Insurance and Status: Insurance coverage has been reviewed Patient has primary care physician: Yes Home environment has been reviewed: from home Prior level of function:: independent Prior/Current Home Services: No current home services Social Determinants of Health Reivew: SDOH reviewed no interventions necessary Readmission risk has been reviewed: Yes Transition of care needs: no transition of care needs at this time

## 2022-08-13 NOTE — Progress Notes (Signed)
IV removed and discharge instructions reviewed.  Lovenox injection education printout given.  Meds sent to pharmacy and to follow up with primary.  Appt with Dr. Ellin Saba to be on Friday.  Transported by WC to main entrance.

## 2022-08-13 NOTE — TOC Benefit Eligibility Note (Signed)
Pharmacy Patient Advocate Encounter  Insurance verification completed.    The patient is insured through [facility]   Ran test claim for enoxaparin (Lovenox) 80 mg/0.8 ml and the current 10 day co-pay is $15.00.   This test claim was processed through [facility]- copay amounts may vary at other pharmacies due to pharmacy/plan contracts, or as the patient moves through the different stages of their insurance plan.    Roland Earl, CPHT Pharmacy Patient Advocate Specialist [facility] Patient Advocate Team Direct Number: [phone]  Fax: [phone]

## 2022-08-13 NOTE — Progress Notes (Signed)
ANTICOAGULATION CONSULT NOTE - Follow Up Consult  Pharmacy Consult for heparin Indication:  PE/DVT  Labs: Recent Labs    08/12/22 1201 08/12/22 1401 08/12/22 2056  HGB 15.3*  --   --   HCT 47.4*  --   --   PLT 307  --   --   APTT 32  --  77*  LABPROT 16.7*  --  14.0  INR 1.3*  --  1.1  HEPARINUNFRC >1.10*  --   --   CREATININE 1.02*  --   --   TROPONINIHS 3 3  --     Assessment/Plan:  57yo female therapeutic on heparin with initial dosing for DVT and PE. Will continue infusion at current rate of 1300 units/hr and confirm stable with am labs.  Vernard Gambles, PharmD, BCPS 08/13/2022 12:38 AM

## 2022-08-14 ENCOUNTER — Telehealth: Payer: Self-pay

## 2022-08-14 ENCOUNTER — Inpatient Hospital Stay: Payer: Commercial Managed Care - PPO | Admitting: Hematology

## 2022-08-14 ENCOUNTER — Encounter: Payer: Self-pay | Admitting: Family Medicine

## 2022-08-14 LAB — CARDIOLIPIN ANTIBODIES, IGG, IGM, IGA
Anticardiolipin IgA: <9 APL U/mL (ref 0–11)
Anticardiolipin IgG: <9 GPL U/mL (ref 0–14)
Anticardiolipin IgM: <9 MPL U/mL (ref 0–12)

## 2022-08-14 LAB — LUPUS ANTICOAGULANT PANEL
DRVVT: 49.2 s — ABNORMAL HIGH (ref 0.0–47.0)
PTT Lupus Anticoagulant: 44.8 s — ABNORMAL HIGH (ref 0.0–43.5)

## 2022-08-14 LAB — DRVVT MIX: dRVVT Mix: 44.6 s — ABNORMAL HIGH (ref 0.0–40.4)

## 2022-08-14 LAB — HEXAGONAL PHASE PHOSPHOLIPID: Hexagonal Phase Phospholipid: 2 s (ref 0–11)

## 2022-08-14 LAB — PTT-LA MIX: PTT-LA Mix: 43 s — ABNORMAL HIGH (ref 0.0–40.5)

## 2022-08-14 LAB — DRVVT CONFIRM: dRVVT Confirm: 1 ratio (ref 0.8–1.2)

## 2022-08-14 NOTE — Transitions of Care (Post Inpatient/ED Visit) (Signed)
   08/14/2022  Name: Ruth Robertson MRN: [id number] DOB: 30-Jul-1965  Today's TOC FU Call Status: Today's TOC FU Call Status:: Successful TOC FU Call Competed TOC FU Call Complete Date: 08/14/22  Transition Care Management Follow-up Telephone Call Date of Discharge: 08/13/22 [facility] Type of Discharge: Inpatient Admission Primary Inpatient Discharge Diagnosis:: PE How have you been since you were released from the hospital?: Better Any questions or concerns?: No  Items Reviewed: Did you receive and understand the discharge instructions provided?: Yes Medications obtained,verified, and reconciled?: Yes (Medications Reviewed) Any new allergies since your discharge?: No Dietary orders reviewed?: Yes Do you have support at home?: Yes People in Home: child(ren), adult  Medications Reviewed Today: Medications Reviewed Today     Reviewed by Karena Addison, LPN (Licensed Practical Nurse) on 08/14/22 at 1040  Med List Status: <None>   Medication Order Taking? Sig Documenting Provider Last Dose Status Informant  acetaminophen (TYLENOL) 500 MG tablet [id number] No Take 500 mg by mouth every 6 (six) hours as needed for mild pain or moderate pain. [provider] Taking Active   enoxaparin (LOVENOX) 80 MG/0.8ML injection [id number]  Inject 0.8 mLs (80 mg total) into the skin every 12 (twelve) hours for 5 days. Tyrone Nine, MD  Active   enoxaparin (LOVENOX) 80 MG/0.8ML injection [id number]  Inject 0.8 mLs (80 mg total) into the skin every 12 (twelve) hours for 5 days Tyrone Nine, MD  Active   ondansetron (ZOFRAN) 8 MG tablet [id number] No Take 1 tablet (8 mg total) by mouth every 8 (eight) hours as needed for nausea. Babs Sciara, MD Taking Active Self  sulfaSALAzine (AZULFIDINE) 500 MG EC tablet [id number] No Take 2 tablets (1,000 mg total) by mouth 2 (two) times daily with food  Taking Active   warfarin (COUMADIN) 5 MG tablet [id number]  Take 1  tablet (5 mg total) by mouth daily at 4 PM. Tyrone Nine, MD  Active   warfarin (COUMADIN) 5 MG tablet [id number]  Take 1 tablet (5 mg total) by mouth daily at 4 PM. Tyrone Nine, MD  Active             Home Care and Equipment/Supplies: Were Home Health Services Ordered?: NA Any new equipment or medical supplies ordered?: NA  Functional Questionnaire: Do you need assistance with bathing/showering or dressing?: No Do you need assistance with meal preparation?: No Do you need assistance with eating?: No Do you have difficulty maintaining continence: No Do you need assistance with getting out of bed/getting out of a chair/moving?: No Do you have difficulty managing or taking your medications?: No  Follow up appointments reviewed: PCP Follow-up appointment confirmed?: No (no avail appts, sent message to staff to schedule) Date of Specialist follow-up appointment?: 08/16/22 Follow-Up Specialty Provider:: hemo Do you need transportation to your follow-up appointment?: No Do you understand care options if your condition(s) worsen?: Yes-patient verbalized understanding    SIGNATURE Karena Addison, LPN [facility] Nurse Health Advisor Direct Dial [phone]

## 2022-08-14 NOTE — Telephone Encounter (Signed)
I did a phone conversation with the patient.  We reviewed over safety and medication warning signs to watch for she sees hematology on Friday and will have INR at that time-she will connect with [place] if any problems

## 2022-08-15 ENCOUNTER — Telehealth (HOSPITAL_COMMUNITY): Payer: Self-pay | Admitting: Student-PharmD

## 2022-08-15 ENCOUNTER — Other Ambulatory Visit: Payer: Self-pay

## 2022-08-15 DIAGNOSIS — I82451 Acute embolism and thrombosis of right peroneal vein: Secondary | ICD-10-CM

## 2022-08-15 DIAGNOSIS — I2699 Other pulmonary embolism without acute cor pulmonale: Secondary | ICD-10-CM

## 2022-08-15 LAB — BETA-2-GLYCOPROTEIN I ABS, IGG/M/A
Beta-2 Glyco I IgG: <9 GPI IgG units (ref 0–20)
Beta-2-Glycoprotein I IgA: 13 GPI IgA units (ref 0–25)
Beta-2-Glycoprotein I IgM: 13 GPI IgM units (ref 0–32)

## 2022-08-15 NOTE — Progress Notes (Signed)
[facility] [address] [place] [address]   Clinic Day:  08/16/22   Referring physician: Nada Libman, MD  Patient Care Team: Babs Sciara, MD as PCP - General (Family Medicine) Doreatha Massed, MD as Medical Oncologist (Hematology)   ASSESSMENT & PLAN:   Assessment:  1.  Weekly provoked/unprovoked pulmonary embolism: - She [place] from the beach (4 hours with 1 stop) and noticed right knee pain and evaluated in the ER on 08/03/2022 and 72,024. - Doppler (08/07/2022): Acute DVT involving right peroneal vein, right soleal vein, right gastrocnemius veins. - Eliquis started on 08/07/2022 with loading dose. - Workup with pleuritic pain in the right shoulder/upper back on 08/12/2022. - CT angiogram (08/11/2020): Positive for acute PE with CT evidence of right heart strain consistent with at least submassive PE.  Patchy density in the superior segment of the right lower lobe consistent with infiltrate/infarction. - No history of miscarriages. - Lupus anticoagulant, anticardiolipin and antibeta 2 glycoprotein antibodies negative. - It is not entirely clear if she is resistant to Eliquis.  However given the size of the embolus, recommend switching to anticoagulant to a different class. - Coumadin started on 08/12/2022  2.  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 - No family history of lupus anticoagulant.  Maternal aunt had breast cancer.  Another maternal aunt had colon cancer.  Plan:  1.  Weekly provoked/unprovoked DVT and PE: - Reviewed results of lupus anticoagulant which was negative.  Anticardiolipin antibodies were negative.  Antibeta-2 glycoprotein 1 antibodies were negative. - Factor V Leiden and prothrombin gene mutation testing results are pending. - She is taking 5 mg of Coumadin every night starting 08/12/2022. - INR today is 1.4 with PT of 17.4. - Recommend increasing Coumadin to 7.5 mg at  bedtime.  Continue Lovenox 1 mg/kg every 12 hours dosing. - Check INR on Monday.  If INR is 1.8 or above, will discontinue Lovenox and continue Coumadin. - At this time I am favoring long-term anticoagulation. - Will give follow-up visit based on labs on Monday.   Orders Placed This Encounter  Procedures   Protime-INR    Standing Status:   Future    Standing Expiration Date:   08/16/2023    Order Specific Question:   Release to patient    Answer:   Immediate [1]    Order Specific Question:   Remote health to draw?    Answer:   No      I,Helena R Teague,acting as a scribe for Doreatha Massed, MD.,have documented all relevant documentation on the behalf of Doreatha Massed, MD,as directed by  Doreatha Massed, MD while in the presence of Doreatha Massed, MD.   I, Doreatha Massed MD, have reviewed the above documentation for accuracy and completeness, and I agree with the above.   Doreatha Massed, MD   7/12/20243:36 PM  CHIEF COMPLAINT/PURPOSE OF CONSULT:   Diagnosis: DVT  Current Therapy: Warfarin  HISTORY OF PRESENT ILLNESS:   Michon is a 57 y.o. female presenting to clinic today for evaluation of right lower extremity DVT at the request of Brabham, Fran Lowes, MD.  Patient was seen in the ED at [facility] on 7/1 for right knee pain. She was referred to ortho at a previous hospital encounter on 6/29 for right knee pain and stated she had an MRI scheduled for 7/5. X-rays showed a small effusion and outpatient [place] were ordered. She was discharged on  7/2. She underwent a venous US on 7/3 which found acute deep vein thrombosis involving the right peroneal veins, right soleal veins, and right gastrocnemius veins on the lower right extremity.   She was last seen in the ED on 7/8 for PE that was found on a CT angiography chest w contrast that showed positive for acute PE with CT evidence of right heart strain (RV/LV Ratio = 1.09) consistent with at least submassive  (intermediate risk) PE, patchy density in the superior segment of the right lower lobe that could be infiltrate or infarction, and mild atelectasis otherwise of both lung bases. She was started on lovenox 80mg  q12h and coumadin 5mg  daily before being discharged on 7/9.   Today, she states that she is doing well overall. Her appetite level is at 100%. Her energy level is at 60%. She is accompanied by her father.   She is taking coumadin as prescribed. Her INR panel today showed 1.4.   She c/o pain and tingling in the hands and feet. She notes her tingling in the feet has been chronic over the past year. 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 She c/o of mild intermittent SOB. She denies any shoulder or back pain.    She denies taking multivitamins.   PAST MEDICAL HISTORY:   Past Medical History: Past Medical History:  Diagnosis Date   Anxiety    Asthma    ONLY WITH BRONCHITIS   GAD (generalized anxiety disorder)    GERD (gastroesophageal reflux disease)    Microscopic hematuria 12/03/2017   Negative cystoscopy negative CAT scan   Pneumonia    PONV (postoperative nausea and vomiting)     Surgical History: Past Surgical History:  Procedure Laterality Date   AGILE CAPSULE N/A 07/09/2016   Procedure: AGILE CAPSULE;  Surgeon: West Bali, MD;  [facility]: [facility];  Service: Endoscopy;  Laterality: N/A;  7:30am, pt to arrive at 7:00am   BIOPSY  09/19/2015   Procedure: BIOPSY;  Surgeon: West Bali, MD;  Location: [facility];  Service: Endoscopy;;  random colon bx's   COLONOSCOPY WITH PROPOFOL N/A 09/19/2015   Dr. Darrick Penna: five 2-3 mm polyps in rectum (hyperplastic) and descending colon, diverticulosis in sigmoid colon and transverse colon. Query post-infectious IBS. Next colonoscopy in 10 years    CYST EXCISION N/A 01/20/2019   Procedure: EXCISION OF RIGHT GROIN SEBACEOUS CYST;  Surgeon: Harriette Bouillon,  MD;  Location: [facility];  Service: General;  Laterality: N/A;   GIVENS CAPSULE STUDY N/A 07/30/2016   Procedure: GIVENS CAPSULE STUDY;  Surgeon: West Bali, MD;  Location: [facility];  Service: Endoscopy;  Laterality: N/A;   HYSTEROSCOPY  2006   POLYPECTOMY  09/19/2015   Procedure: POLYPECTOMY;  Surgeon: West Bali, MD;  Location: [facility];  Service: Endoscopy;;  descending colon polyps x3 cold bx, rectal polyp x2   SALPINGOOPHORECTOMY Bilateral 08/18/2012   Procedure: SALPINGO OOPHORECTOMY;  Surgeon: Dara Lords, MD;  Location: [facility];  Service: Gynecology;  Laterality: Bilateral;   VAGINAL HYSTERECTOMY N/A 08/18/2012   Procedure: HYSTERECTOMY VAGINAL;  Surgeon: Dara Lords, MD;  Location: [facility];  Service: Gynecology;  Laterality: N/A;    Social History: Social History   Socioeconomic History   Marital status: Divorced    Spouse name: Not on file   Number of children: Not on file   Years of education: Not  on file   Highest education level: Not on file  Occupational History   Not on file  Tobacco Use   Smoking status: Every Day    Current packs/day: 1.00    Average packs/day: 1 pack/day for 25.0 years (25.0 ttl pk-yrs)    Types: Cigarettes   Smokeless tobacco: Never  Vaping Use   Vaping status: Never Used  Substance and Sexual Activity   Alcohol use: Not Currently    Alcohol/week: 0.0 standard drinks of alcohol    Comment: rare   Drug use: No   Sexual activity: Not Currently    Partners: Male    Comment: HYST-1st intercourse 65 yo-5 partners  Other Topics Concern   Not on file  Social History Narrative   Not on file   Social Determinants of Health   Financial Resource Strain: Not on file  Food Insecurity: No Food Insecurity (08/12/2022)   Hunger Vital Sign    Worried About Running Out of Food in the Last Year: Never true    Ran Out of Food in the Last Year: Never true  Transportation Needs: No Transportation Needs  (08/12/2022)   PRAPARE - Administrator, Civil Service (Medical): No    Lack of Transportation (Non-Medical): No  Physical Activity: Not on file  Stress: Not on file  Social Connections: Not on file  Intimate Partner Violence: Not At Risk (08/12/2022)   Humiliation, Afraid, Rape, and Kick questionnaire    Fear of Current or Ex-Partner: No    Emotionally Abused: No    Physically Abused: No    Sexually Abused: No    Family History: Family History  Problem Relation Age of Onset   Heart attack Mother    Heart failure Father    Stroke Father    Hypertension Maternal Grandmother    Diabetes Paternal Grandfather    Breast cancer Maternal Aunt        50's   Colon cancer Maternal Aunt        late 17s or 70s   Inflammatory bowel disease Neg Hx     Current Medications:  Current Outpatient Medications:    acetaminophen (TYLENOL) 500 MG tablet, Take 500 mg by mouth every 6 (six) hours as needed for mild pain or moderate pain., Disp: , Rfl:    enoxaparin (LOVENOX) 80 MG/0.8ML injection, Inject 0.8 mLs (80 mg total) into the skin every 12 (twelve) hours for 5 days., Disp: 8 mL, Rfl: 0   enoxaparin (LOVENOX) 80 MG/0.8ML injection, Inject 0.8 mLs (80 mg total) into the skin every 12 (twelve) hours for 5 days, Disp: 8 mL, Rfl: 0   sulfaSALAzine (AZULFIDINE) 500 MG EC tablet, Take 2 tablets (1,000 mg total) by mouth 2 (two) times daily with food, Disp: 120 tablet, Rfl: 1   warfarin (COUMADIN) 5 MG tablet, Take 1 tablet (5 mg total) by mouth daily at 4 PM., Disp: 30 tablet, Rfl: 0   warfarin (COUMADIN) 5 MG tablet, Take 1 tablet (5 mg total) by mouth daily at 4 PM., Disp: 30 tablet, Rfl: 0   ondansetron (ZOFRAN) 8 MG tablet, Take 1 tablet (8 mg total) by mouth every 8 (eight) hours as needed for nausea. (Patient not taking: Reported on 08/16/2022), Disp: 15 tablet, Rfl: 2   Allergies: No Known Allergies  REVIEW OF SYSTEMS:   Review of Systems  Constitutional:  Negative for chills,  fatigue and fever.  HENT:   Negative for lump/mass, mouth sores, nosebleeds, sore throat and trouble swallowing.  Eyes:  Negative for eye problems.  Respiratory:  Positive for shortness of breath. Negative for cough.   Cardiovascular:  Negative for chest pain, leg swelling and palpitations.  Gastrointestinal:  Negative for abdominal pain, constipation, diarrhea, nausea and vomiting.  Genitourinary:  Negative for bladder incontinence, difficulty urinating, dysuria, frequency, hematuria and nocturia.   Musculoskeletal:  Negative for arthralgias, back pain, flank pain, myalgias and neck pain.  Skin:  Negative for itching and rash.  Neurological:  Positive for headaches and numbness (tingling in hands and feet). Negative for dizziness.  Hematological:  Does not bruise/bleed easily.  Psychiatric/Behavioral:  Positive for sleep disturbance (due to leg pain). Negative for depression and suicidal ideas. The patient is not nervous/anxious.   All other systems reviewed and are negative.    VITALS:   Blood pressure 103/78, pulse 77, temperature 98.4 F (36.9 C), temperature source Oral, resp. rate 16, height 5\' 5"  (1.651 m), weight 180 lb 4.8 oz (81.8 kg), last menstrual period 06/12/2012, SpO2 98%.  Wt Readings from Last 3 Encounters:  08/16/22 180 lb 4.8 oz (81.8 kg)  08/12/22 182 lb (82.6 kg)  08/09/22 182 lb 6.4 oz (82.7 kg)    Body mass index is 30 kg/m.   PHYSICAL EXAM:   Physical Exam Vitals and nursing note reviewed. Exam conducted with a chaperone present.  Constitutional:      Appearance: Normal appearance.  Cardiovascular:     Rate and Rhythm: Normal rate and regular rhythm.     Pulses: Normal pulses.     Heart sounds: Normal heart sounds.  Pulmonary:     Effort: Pulmonary effort is normal.     Breath sounds: Normal breath sounds.  Abdominal:     Palpations: Abdomen is soft. There is no hepatomegaly, splenomegaly or mass.     Tenderness: There is no abdominal tenderness.   Musculoskeletal:     Right lower leg: No edema.     Left lower leg: No edema.  Lymphadenopathy:     Cervical: No cervical adenopathy.     Right cervical: No superficial, deep or posterior cervical adenopathy.    Left cervical: No superficial, deep or posterior cervical adenopathy.     Upper Body:     Right upper body: No supraclavicular or axillary adenopathy.     Left upper body: No supraclavicular or axillary adenopathy.  Neurological:     General: No focal deficit present.     Mental Status: She is alert and oriented to person, place, and time.  Psychiatric:        Mood and Affect: Mood normal.        Behavior: Behavior normal.     LABS:      Latest Ref Rng & Units 08/12/2022   12:01 PM 08/05/2022   10:29 PM 04/24/2021    7:46 AM  CBC  WBC 4.0 - 10.5 K/uL 11.2  10.3  6.9   Hemoglobin 12.0 - 15.0 g/dL 16.1  09.6  04.5   Hematocrit 36.0 - 46.0 % 47.4  45.0  46.0   Platelets 150 - 400 K/uL 307  221  294       Latest Ref Rng & Units 08/13/2022    5:18 AM 08/12/2022   12:01 PM 08/05/2022   10:29 PM  CMP  Glucose 70 - 99 mg/dL 409  97  811   BUN 6 - 20 mg/dL 16  19  11    Creatinine 0.44 - 1.00 mg/dL 9.14  7.82  9.56   Sodium  135 - 145 mmol/L 137  138  141   Potassium 3.5 - 5.1 mmol/L 3.9  3.9  3.7   Chloride 98 - 111 mmol/L 104  103  109   CO2 22 - 32 mmol/L 23  24  23    Calcium 8.9 - 10.3 mg/dL 8.9  9.3  8.8   Total Protein 6.5 - 8.1 g/dL   6.5   Total Bilirubin 0.3 - 1.2 mg/dL   0.4   Alkaline Phos 38 - 126 U/L   54   AST 15 - 41 U/L   16   ALT 0 - 44 U/L   18      No results found for: "CEA1", "CEA" / No results found for: "CEA1", "CEA" No results found for: "PSA1" No results found for: "ZOX096" No results found for: "CAN125"  No results found for: "TOTALPROTELP", "ALBUMINELP", "A1GS", "A2GS", "BETS", "BETA2SER", "GAMS", "MSPIKE", "SPEI" Lab Results  Component Value Date   TIBC 338 02/28/2021   FERRITIN 42 02/28/2021   FERRITIN 30 12/18/2013   IRONPCTSAT 33 (H)  02/28/2021   No results found for: "LDH"   STUDIES:   ECHOCARDIOGRAM COMPLETE  Result Date: 08/12/2022    ECHOCARDIOGRAM REPORT   Patient Name:   Ruth Robertson Date of Exam: 08/12/2022 Medical Rec #:  [id number]                Height:       65.0 in Accession #:    [id number]               Weight:       182.0 lb Date of Birth:  09-Sep-1965                BSA:          1.900 m Patient Age:    56 years                 BP:           127/94 mmHg Patient Gender: F                        HR:           72 bpm. [facility] Procedure: 2D Echo, Cardiac Doppler and Color Doppler Indications:    Pulmonary embolus  History:        Patient has prior history of Echocardiogram examinations, most                 recent 03/04/2021.  Sonographer:    Milda Smart Referring Phys: [id number] Nine IMPRESSIONS  1. Left ventricular ejection fraction, by estimation, is 60 to 65%. The left ventricle has normal function. The left ventricle has no regional wall motion abnormalities. Left ventricular diastolic parameters were normal.  2. Right ventricular systolic function is normal. The right ventricular size is normal. Tricuspid regurgitation signal is inadequate for assessing PA pressure.  3. The mitral valve is grossly normal. Trivial mitral valve regurgitation.  4. The aortic valve is tricuspid. Aortic valve regurgitation is not visualized.  5. The inferior vena cava is normal in size with greater than 50% respiratory variability, suggesting right atrial pressure of 3 mmHg. Comparison(s): Prior images unable to be directly viewed. FINDINGS  Left Ventricle: Left ventricular ejection fraction, by estimation, is 60 to 65%. The left ventricle has normal function. The left ventricle has no regional wall motion abnormalities. The left ventricular internal cavity  size was normal in size. There is  no left ventricular hypertrophy. Left ventricular diastolic parameters were normal. Right Ventricle: The right  ventricular size is normal. No increase in right ventricular wall thickness. Right ventricular systolic function is normal. Tricuspid regurgitation signal is inadequate for assessing PA pressure. Left Atrium: Left atrial size was normal in size. Right Atrium: Right atrial size was normal in size. Pericardium: There is no evidence of pericardial effusion. Mitral Valve: The mitral valve is grossly normal. Trivial mitral valve regurgitation. MV peak gradient, 3.8 mmHg. The mean mitral valve gradient is 2.0 mmHg. Tricuspid Valve: The tricuspid valve is grossly normal. Tricuspid valve regurgitation is trivial. Aortic Valve: The aortic valve is tricuspid. There is mild aortic valve annular calcification. Aortic valve regurgitation is not visualized. Pulmonic Valve: The pulmonic valve was grossly normal. Pulmonic valve regurgitation is trivial. Aorta: The aortic root is normal in size and structure. Venous: The inferior vena cava is normal in size with greater than 50% respiratory variability, suggesting right atrial pressure of 3 mmHg. IAS/Shunts: No atrial level shunt detected by color flow Doppler.  LEFT VENTRICLE PLAX 2D LVIDd:         4.30 cm     Diastology LVIDs:         2.80 cm     LV e' medial:    9.46 cm/s LV PW:         0.70 cm     LV E/e' medial:  7.2 LV IVS:        0.80 cm     LV e' lateral:   8.38 cm/s LVOT diam:     1.90 cm     LV E/e' lateral: 8.1 LV SV:         69 LV SV Index:   37 LVOT Area:     2.84 cm  LV Volumes (MOD) LV vol d, MOD A2C: 34.0 ml LV vol d, MOD A4C: 56.8 ml LV vol s, MOD A2C: 12.1 ml LV vol s, MOD A4C: 21.9 ml LV SV MOD A2C:     21.9 ml LV SV MOD A4C:     56.8 ml LV SV MOD BP:      29.0 ml RIGHT VENTRICLE             IVC RV S prime:     12.50 cm/s  IVC diam: 1.20 cm TAPSE (M-mode): 2.3 cm LEFT ATRIUM             Index        RIGHT ATRIUM          Index LA diam:        2.70 cm 1.42 cm/m   RA Area:     9.72 cm LA Vol (A2C):   21.3 ml 11.23 ml/m  RA Volume:   18.80 ml 9.89 ml/m LA Vol  (A4C):   27.5 ml 14.47 ml/m LA Biplane Vol: 31.4 ml 16.52 ml/m  AORTIC VALVE LVOT Vmax:   123.00 cm/s LVOT Vmean:  86.300 cm/s LVOT VTI:    0.245 m  AORTA Ao Root diam: 2.40 cm Ao Asc diam:  3.00 cm MITRAL VALVE MV Area (PHT): 2.94 cm    SHUNTS MV Area VTI:   2.74 cm    Systemic VTI:  0.24 m MV Peak grad:  3.8 mmHg    Systemic Diam: 1.90 cm MV Mean grad:  2.0 mmHg MV Vmax:       0.97 m/s MV Vmean:  63.1 cm/s MV Decel Time: 258 msec MV E velocity: 67.70 cm/s MV A velocity: 87.00 cm/s MV E/A ratio:  0.78 Nona Dell MD Electronically signed by Nona Dell MD Signature Date/Time: 08/12/2022/5:05:54 PM    Final    CT Angio Chest PE W/Cm &/Or Wo Cm  Result Date: 08/12/2022 CLINICAL DATA:  Pulmonary embolism suspected. High probability. Right-sided chest pain. EXAM: CT ANGIOGRAPHY CHEST WITH CONTRAST TECHNIQUE: Multidetector CT imaging of the chest was performed using the standard protocol during bolus administration of intravenous contrast. Multiplanar CT image reconstructions and MIPs were obtained to evaluate the vascular anatomy. RADIATION DOSE REDUCTION: This exam was performed according to the departmental dose-optimization program which includes automated exposure control, adjustment of the mA and/or kV according to patient size and/or use of iterative reconstruction technique. CONTRAST:  75mL OMNIPAQUE IOHEXOL 350 MG/ML SOLN COMPARISON:  Chest radiography same day FINDINGS: Cardiovascular: Overall heart size is normal. No visible coronary artery calcification. Very minimal aortic atherosclerotic calcification. Pulmonary arterial opacification is good. Pulmonary emboli are present within the right pulmonary artery branches, most extensive in the lower lobe. There may be a few tiny emboli on the left. Right ventricular to left ventricular ratio is 1.09. Mediastinum/Nodes: No mass or lymphadenopathy. Lungs/Pleura: Patchy density in the superior segment of the right lower lobe that could be infiltrate  or infarction. Mild atelectasis otherwise of both lung bases. No pleural effusion. Upper Abdomen: Negative Musculoskeletal: No significant finding. Review of the MIP images confirms the above findings. IMPRESSION: 1. Positive for acute PE with CT evidence of right heart strain (RV/LV Ratio = 1.09) consistent with at least submassive (intermediate risk) PE. The presence of right heart strain has been associated with an increased risk of morbidity and mortality. Please refer to the "Code PE Focused" order set in EPIC. 2. Patchy density in the superior segment of the right lower lobe that could be infiltrate or infarction. Mild atelectasis otherwise of both lung bases. 3. These results were called by telephone at the time of interpretation on 08/12/2022 at 1:18 pm to provider [facility] , who verbally acknowledged these results. Aortic Atherosclerosis (ICD10-I70.0). Electronically Signed   By: Paulina Fusi M.D.   On: 08/12/2022 13:19   DG Chest 2 View  Result Date: 08/12/2022 CLINICAL DATA:  CP EXAM: CHEST - 2 VIEW COMPARISON:  CXR 02/28/21 FINDINGS: No pleural effusion. No pneumothorax. Unchanged cardiac and mediastinal contours. Linear opacity at the left base most likely represent atelectasis. No radiographically apparent displaced rib fractures. Visualized upper abdomen is unremarkable. Vertebral body heights are maintained. IMPRESSION: Likely left basilar atelectasis. Electronically Signed   By: Lorenza Cambridge M.D.   On: 08/12/2022 12:34   LE VENOUS  Result Date: 08/07/2022  Lower Venous DVT Study Patient Name:  KYLER LERETTE  Date of Exam:   08/07/2022 Medical Rec #: [id number]                 Accession #:    [id number] Date of Birth: 17-Jun-1965                 Patient Gender: F Patient Age:   63 years Exam Location:  [facility] Procedure:      VAS [place] LOWER EXTREMITY VENOUS (DVT) Referring Phys: Roxy Horseman --------------------------------------------------------------------------------   Indications: Swelling, and Pain.  Risk Factors: Recent extended travel. Comparison Study: No previous study. Performing Technologist: McKayla Maag RVT, [place]  Examination Guidelines: A complete evaluation includes B-mode imaging, spectral Doppler, color Doppler, and power  Doppler as needed of all accessible portions of each vessel. Bilateral testing is considered an integral part of a complete examination. Limited examinations for reoccurring indications may be performed as noted. The reflux portion of the exam is performed with the patient in reverse Trendelenburg.  +---------+---------------+---------+-----------+----------+--------------+ RIGHT    CompressibilityPhasicitySpontaneityPropertiesThrombus Aging +---------+---------------+---------+-----------+----------+--------------+ CFV      Full           Yes      Yes                                 +---------+---------------+---------+-----------+----------+--------------+ SFJ      Full                                                        +---------+---------------+---------+-----------+----------+--------------+ FV Prox  Full                                                        +---------+---------------+---------+-----------+----------+--------------+ FV Mid   Full                                                        +---------+---------------+---------+-----------+----------+--------------+ FV DistalFull                                                        +---------+---------------+---------+-----------+----------+--------------+ PFV      Full                                                        +---------+---------------+---------+-----------+----------+--------------+ POP      Full           Yes      Yes                                 +---------+---------------+---------+-----------+----------+--------------+ PTV      Full                                                         +---------+---------------+---------+-----------+----------+--------------+ PERO     Partial        No       No                   Acute          +---------+---------------+---------+-----------+----------+--------------+ Soleal   None  No       No                   Acute          +---------+---------------+---------+-----------+----------+--------------+ Gastroc  None           No       No                   Acute          +---------+---------------+---------+-----------+----------+--------------+   +----+---------------+---------+-----------+----------+--------------+ LEFTCompressibilityPhasicitySpontaneityPropertiesThrombus Aging +----+---------------+---------+-----------+----------+--------------+ CFV Full           Yes      Yes                                 +----+---------------+---------+-----------+----------+--------------+ SFJ Full                                                        +----+---------------+---------+-----------+----------+--------------+     Summary: RIGHT: - Findings consistent with acute deep vein thrombosis involving the right peroneal veins, right soleal veins, and right gastrocnemius veins. - No cystic structure found in the popliteal fossa.  LEFT: - No evidence of common femoral vein obstruction.  *See table(s) above for measurements and observations. Electronically signed by Coral Else MD on 08/07/2022 at 9:19:42 PM.    Final    DG Knee Complete 4 Views Right  Result Date: 08/05/2022 CLINICAL DATA:  Right knee pain. EXAM: RIGHT KNEE - COMPLETE 4+ VIEW COMPARISON:  None Available. FINDINGS: No evidence of fracture or dislocation. Mild medial tibiofemoral and patellofemoral joint space narrowing with small joint effusion. IMPRESSION: Mild medial tibiofemoral and patellofemoral arthritis with small joint effusion. Electronically Signed   By: Larose Hires D.O.   On: 08/05/2022 22:48   DG Knee Complete 4 Views Right  Result  Date: 08/03/2022 CLINICAL DATA:  Knee pain EXAM: RIGHT KNEE - COMPLETE 4+ VIEW COMPARISON:  None Available. FINDINGS: No fracture or dislocation is seen. The joint spaces are preserved. Visualized soft tissues are within normal limits. No suprapatellar knee joint effusion. IMPRESSION: Negative. Electronically Signed   By: Charline Bills M.D.   On: 08/03/2022 20:19

## 2022-08-15 NOTE — Telephone Encounter (Signed)
Patient called to inform us of her recent hospitalization at [facility] for PE and that she was switched from Eliquis to warfarin with a Lovenox bridge. I reviewed all of her hospitalization notes. Provided counseling and answered her questions on Lovenox and warfarin. She has a hematology appointment tomorrow with Dr. Ellin Saba, and they will check her INR then. Encouraged her to write down any questions she has for him so she can get them answered during her visit. She has no further questions for me at this time. Encouraged her to reach out to the clinic if she needs anything.

## 2022-08-16 ENCOUNTER — Inpatient Hospital Stay: Payer: Commercial Managed Care - PPO | Attending: Hematology | Admitting: Hematology

## 2022-08-16 ENCOUNTER — Inpatient Hospital Stay: Payer: Commercial Managed Care - PPO

## 2022-08-16 ENCOUNTER — Encounter: Payer: Self-pay | Admitting: Hematology

## 2022-08-16 ENCOUNTER — Other Ambulatory Visit: Payer: Self-pay | Admitting: Family Medicine

## 2022-08-16 VITALS — BP 103/78 | HR 77 | Temp 98.4°F | Resp 16 | Ht 65.0 in | Wt 180.3 lb

## 2022-08-16 DIAGNOSIS — Z7901 Long term (current) use of anticoagulants: Secondary | ICD-10-CM | POA: Diagnosis not present

## 2022-08-16 DIAGNOSIS — J45909 Unspecified asthma, uncomplicated: Secondary | ICD-10-CM | POA: Insufficient documentation

## 2022-08-16 DIAGNOSIS — F17210 Nicotine dependence, cigarettes, uncomplicated: Secondary | ICD-10-CM | POA: Diagnosis not present

## 2022-08-16 DIAGNOSIS — R202 Paresthesia of skin: Secondary | ICD-10-CM | POA: Insufficient documentation

## 2022-08-16 DIAGNOSIS — I82451 Acute embolism and thrombosis of right peroneal vein: Secondary | ICD-10-CM

## 2022-08-16 DIAGNOSIS — Z803 Family history of malignant neoplasm of breast: Secondary | ICD-10-CM | POA: Diagnosis not present

## 2022-08-16 DIAGNOSIS — Z800 Family history of malignant neoplasm of digestive organs: Secondary | ICD-10-CM | POA: Insufficient documentation

## 2022-08-16 DIAGNOSIS — Z86711 Personal history of pulmonary embolism: Secondary | ICD-10-CM | POA: Diagnosis not present

## 2022-08-16 DIAGNOSIS — M79643 Pain in unspecified hand: Secondary | ICD-10-CM | POA: Diagnosis not present

## 2022-08-16 DIAGNOSIS — I700 Atherosclerosis of aorta: Secondary | ICD-10-CM | POA: Diagnosis not present

## 2022-08-16 DIAGNOSIS — I2699 Other pulmonary embolism without acute cor pulmonale: Secondary | ICD-10-CM | POA: Diagnosis not present

## 2022-08-16 DIAGNOSIS — K219 Gastro-esophageal reflux disease without esophagitis: Secondary | ICD-10-CM | POA: Insufficient documentation

## 2022-08-16 LAB — PROTIME-INR
INR: 1.4 — ABNORMAL HIGH (ref 0.8–1.2)
Prothrombin Time: 17.4 seconds — ABNORMAL HIGH (ref 11.4–15.2)

## 2022-08-16 MED ORDER — ENOXAPARIN SODIUM 80 MG/0.8ML IJ SOSY
80.0000 mg | PREFILLED_SYRINGE | Freq: Two times a day (BID) | INTRAMUSCULAR | 1 refills | Status: DC
  Filled 2022-08-16: qty 16, 10d supply, fill #0

## 2022-08-16 NOTE — Progress Notes (Signed)
Received page while working as hospitalist to call patient. Patient hospitalized for PE and being bringed to coumadin. Had received call from Dr Marice Potter office about needing to continue bridging coumadin with lovenox, but doesn't have enough lovenox. Lovenox refilled to [facility] for 10 days with refill to ensure patient has more than enough to cover her until therapeutic.

## 2022-08-16 NOTE — Patient Instructions (Addendum)
[facility]  Discharge Instructions  You were seen and examined today by Dr. Ellin Saba.  Dr. Ellin Saba has reviewed your labs today. Take 1 and a half tablet of Coumadin starting tonight. We will recheck your labs on Monday.  Continue Lovenox injections.  Follow-up as scheduled.  Thank you for choosing [facility] to provide your oncology and hematology care.   To afford each patient quality time with our provider, please arrive at least 15 minutes before your scheduled appointment time. You may need to reschedule your appointment if you arrive late (10 or more minutes). Arriving late affects you and other patients whose appointments are after yours.  Also, if you miss three or more appointments without notifying the office, you may be dismissed from the clinic at the provider's discretion.    Again, thank you for choosing [facility].  Our hope is that these requests will decrease the amount of time that you wait before being seen by our physicians.   If you have a lab appointment with the Cancer Center - please note that after April 8th, all labs will be drawn in the cancer center.  You do not have to check in or register with the main entrance as you have in the past but will complete your check-in at the cancer center.            _____________________________________________________________  Should you have questions after your visit to [facility], please contact our office at [phone] and follow the prompts.  Our office hours are 8:00 a.m. to 4:30 p.m. Monday - Thursday and 8:00 a.m. to 2:30 p.m. Friday.  Please note that voicemails left after 4:00 p.m. may not be returned until the following business day.  We are closed weekends and all major holidays.  You do have access to a nurse 24-7, just call the main number to the clinic [phone] and do not press any options, hold on the line and a nurse  will answer the phone.    For prescription refill requests, have your pharmacy contact our office and allow 72 hours.    Masks are no longer required in the cancer centers. If you would like for your care team to wear a mask while they are taking care of you, please let them know. You may have one support person who is at least 57 years old accompany you for your appointments.

## 2022-08-17 ENCOUNTER — Other Ambulatory Visit (HOSPITAL_COMMUNITY): Payer: Self-pay

## 2022-08-19 ENCOUNTER — Inpatient Hospital Stay: Payer: Commercial Managed Care - PPO

## 2022-08-19 ENCOUNTER — Telehealth: Payer: Self-pay | Admitting: *Deleted

## 2022-08-19 DIAGNOSIS — I2699 Other pulmonary embolism without acute cor pulmonale: Secondary | ICD-10-CM

## 2022-08-19 DIAGNOSIS — Z803 Family history of malignant neoplasm of breast: Secondary | ICD-10-CM | POA: Diagnosis not present

## 2022-08-19 DIAGNOSIS — Z7901 Long term (current) use of anticoagulants: Secondary | ICD-10-CM

## 2022-08-19 DIAGNOSIS — M79643 Pain in unspecified hand: Secondary | ICD-10-CM | POA: Diagnosis not present

## 2022-08-19 DIAGNOSIS — Z86711 Personal history of pulmonary embolism: Secondary | ICD-10-CM | POA: Diagnosis not present

## 2022-08-19 DIAGNOSIS — K219 Gastro-esophageal reflux disease without esophagitis: Secondary | ICD-10-CM | POA: Diagnosis not present

## 2022-08-19 DIAGNOSIS — F17210 Nicotine dependence, cigarettes, uncomplicated: Secondary | ICD-10-CM | POA: Diagnosis not present

## 2022-08-19 DIAGNOSIS — Z800 Family history of malignant neoplasm of digestive organs: Secondary | ICD-10-CM | POA: Diagnosis not present

## 2022-08-19 DIAGNOSIS — I82451 Acute embolism and thrombosis of right peroneal vein: Secondary | ICD-10-CM

## 2022-08-19 DIAGNOSIS — I700 Atherosclerosis of aorta: Secondary | ICD-10-CM | POA: Diagnosis not present

## 2022-08-19 DIAGNOSIS — R202 Paresthesia of skin: Secondary | ICD-10-CM | POA: Diagnosis not present

## 2022-08-19 DIAGNOSIS — J45909 Unspecified asthma, uncomplicated: Secondary | ICD-10-CM | POA: Diagnosis not present

## 2022-08-19 LAB — PROTHROMBIN GENE MUTATION

## 2022-08-19 LAB — PROTIME-INR
INR: 1.8 — ABNORMAL HIGH (ref 0.8–1.2)
Prothrombin Time: 20.7 seconds — ABNORMAL HIGH (ref 11.4–15.2)

## 2022-08-19 NOTE — Telephone Encounter (Signed)
Patient contacted to advise of INR of 1.8.  Per Dr. Ellin Saba, will refer to coumadin clinic and she is to stop lovenox and remain on 7.5 mg Coumadin daily.  If she is unable to secure an appointment with them within the week, we will recheck INR on Monday.  Appointment made in the event she does not have appointment with them, and will provide in person teaching at that time.  In addition, she will be contacted with a follow up appointment for 3 months with Dr. Ellin Saba with labs.  Brief teaching given over the phone related to coumadin and management.  Patient verbalized understanding.

## 2022-08-22 ENCOUNTER — Other Ambulatory Visit (HOSPITAL_COMMUNITY): Payer: Self-pay

## 2022-08-23 ENCOUNTER — Other Ambulatory Visit: Payer: Self-pay

## 2022-08-23 DIAGNOSIS — I82451 Acute embolism and thrombosis of right peroneal vein: Secondary | ICD-10-CM

## 2022-08-26 ENCOUNTER — Inpatient Hospital Stay: Payer: Commercial Managed Care - PPO

## 2022-08-26 DIAGNOSIS — R202 Paresthesia of skin: Secondary | ICD-10-CM | POA: Diagnosis not present

## 2022-08-26 DIAGNOSIS — J45909 Unspecified asthma, uncomplicated: Secondary | ICD-10-CM | POA: Diagnosis not present

## 2022-08-26 DIAGNOSIS — Z800 Family history of malignant neoplasm of digestive organs: Secondary | ICD-10-CM | POA: Diagnosis not present

## 2022-08-26 DIAGNOSIS — Z79899 Other long term (current) drug therapy: Secondary | ICD-10-CM | POA: Diagnosis not present

## 2022-08-26 DIAGNOSIS — M47819 Spondylosis without myelopathy or radiculopathy, site unspecified: Secondary | ICD-10-CM | POA: Diagnosis not present

## 2022-08-26 DIAGNOSIS — Z803 Family history of malignant neoplasm of breast: Secondary | ICD-10-CM | POA: Diagnosis not present

## 2022-08-26 DIAGNOSIS — K219 Gastro-esophageal reflux disease without esophagitis: Secondary | ICD-10-CM | POA: Diagnosis not present

## 2022-08-26 DIAGNOSIS — Z86711 Personal history of pulmonary embolism: Secondary | ICD-10-CM | POA: Diagnosis not present

## 2022-08-26 DIAGNOSIS — I82451 Acute embolism and thrombosis of right peroneal vein: Secondary | ICD-10-CM

## 2022-08-26 DIAGNOSIS — F17210 Nicotine dependence, cigarettes, uncomplicated: Secondary | ICD-10-CM | POA: Diagnosis not present

## 2022-08-26 DIAGNOSIS — I700 Atherosclerosis of aorta: Secondary | ICD-10-CM | POA: Diagnosis not present

## 2022-08-26 DIAGNOSIS — M79643 Pain in unspecified hand: Secondary | ICD-10-CM | POA: Diagnosis not present

## 2022-08-26 LAB — PROTIME-INR
INR: 2.8 — ABNORMAL HIGH (ref 0.8–1.2)
Prothrombin Time: 30 seconds — ABNORMAL HIGH (ref 11.4–15.2)

## 2022-08-26 LAB — FACTOR 5 LEIDEN

## 2022-08-27 ENCOUNTER — Other Ambulatory Visit: Payer: Self-pay | Admitting: *Deleted

## 2022-08-27 ENCOUNTER — Other Ambulatory Visit: Payer: Self-pay | Admitting: Hematology

## 2022-08-27 ENCOUNTER — Telehealth: Payer: Self-pay | Admitting: *Deleted

## 2022-08-27 ENCOUNTER — Telehealth: Payer: Self-pay

## 2022-08-27 ENCOUNTER — Other Ambulatory Visit (HOSPITAL_COMMUNITY): Payer: Self-pay

## 2022-08-27 DIAGNOSIS — Z7901 Long term (current) use of anticoagulants: Secondary | ICD-10-CM

## 2022-08-27 DIAGNOSIS — I2699 Other pulmonary embolism without acute cor pulmonale: Secondary | ICD-10-CM

## 2022-08-27 DIAGNOSIS — I82451 Acute embolism and thrombosis of right peroneal vein: Secondary | ICD-10-CM

## 2022-08-27 MED ORDER — WARFARIN SODIUM 5 MG PO TABS
5.0000 mg | ORAL_TABLET | Freq: Every day | ORAL | 1 refills | Status: DC
  Filled 2022-08-27 – 2022-08-30 (×4): qty 30, 30d supply, fill #0

## 2022-08-27 NOTE — Telephone Encounter (Signed)
INR 2.8 on 7.5 mg of coumadin daily.  Per Dr. Ellin Saba, continue current dose and recheck in 2 weeks.  Advised patient that we will manage until after her appointment with cardiology.  Verbalized understanding.

## 2022-08-27 NOTE — Telephone Encounter (Signed)
Noted  

## 2022-08-27 NOTE — Telephone Encounter (Signed)
[employer] called and said this patient was marked high risk since being in the hospital and [facility] Manager called from [place] and left her number [phone] if ever any assistance is needed you can reach out to her.

## 2022-08-29 ENCOUNTER — Other Ambulatory Visit (HOSPITAL_COMMUNITY): Payer: Self-pay

## 2022-08-29 MED ORDER — SULFASALAZINE 500 MG PO TBEC
1000.0000 mg | DELAYED_RELEASE_TABLET | Freq: Two times a day (BID) | ORAL | 1 refills | Status: DC
  Filled 2022-08-29: qty 120, 30d supply, fill #0
  Filled 2022-10-11: qty 120, 30d supply, fill #1

## 2022-08-30 ENCOUNTER — Other Ambulatory Visit (HOSPITAL_COMMUNITY): Payer: Self-pay

## 2022-08-30 ENCOUNTER — Other Ambulatory Visit: Payer: Self-pay

## 2022-08-30 ENCOUNTER — Other Ambulatory Visit: Payer: Self-pay | Admitting: Hematology

## 2022-08-30 MED ORDER — WARFARIN SODIUM 7.5 MG PO TABS
7.5000 mg | ORAL_TABLET | Freq: Every day | ORAL | 0 refills | Status: DC
Start: 2022-08-30 — End: 2022-09-16
  Filled 2022-08-30: qty 30, 30d supply, fill #0

## 2022-09-04 ENCOUNTER — Ambulatory Visit
Admission: RE | Admit: 2022-09-04 | Discharge: 2022-09-04 | Disposition: A | Discharge status: Home / Self Care | Payer: Commercial Managed Care - PPO | Source: Ambulatory Visit | Attending: Obstetrics and Gynecology | Admitting: Obstetrics and Gynecology

## 2022-09-04 DIAGNOSIS — E2839 Other primary ovarian failure: Secondary | ICD-10-CM

## 2022-09-04 DIAGNOSIS — M8588 Other specified disorders of bone density and structure, other site: Secondary | ICD-10-CM | POA: Diagnosis not present

## 2022-09-04 DIAGNOSIS — Z90722 Acquired absence of ovaries, bilateral: Secondary | ICD-10-CM | POA: Diagnosis not present

## 2022-09-04 DIAGNOSIS — E349 Endocrine disorder, unspecified: Secondary | ICD-10-CM | POA: Diagnosis not present

## 2022-09-04 DIAGNOSIS — N958 Other specified menopausal and perimenopausal disorders: Secondary | ICD-10-CM | POA: Diagnosis not present

## 2022-09-09 ENCOUNTER — Inpatient Hospital Stay: Payer: Commercial Managed Care - PPO | Attending: Hematology | Admitting: Hematology

## 2022-09-09 DIAGNOSIS — Z7901 Long term (current) use of anticoagulants: Secondary | ICD-10-CM

## 2022-09-09 DIAGNOSIS — Z86711 Personal history of pulmonary embolism: Secondary | ICD-10-CM | POA: Diagnosis not present

## 2022-09-09 DIAGNOSIS — I2699 Other pulmonary embolism without acute cor pulmonale: Secondary | ICD-10-CM

## 2022-09-09 DIAGNOSIS — I82451 Acute embolism and thrombosis of right peroneal vein: Secondary | ICD-10-CM

## 2022-09-09 LAB — PROTIME-INR
INR: 3.5 — ABNORMAL HIGH (ref 0.8–1.2)
Prothrombin Time: 35 seconds — ABNORMAL HIGH (ref 11.4–15.2)

## 2022-09-09 NOTE — Progress Notes (Signed)
Patient notified to continue 7.5 mg dose of coumadin.  Will recheck on Monday 8/12

## 2022-09-16 ENCOUNTER — Inpatient Hospital Stay: Payer: Commercial Managed Care - PPO

## 2022-09-16 ENCOUNTER — Telehealth: Payer: Self-pay | Admitting: *Deleted

## 2022-09-16 ENCOUNTER — Other Ambulatory Visit (HOSPITAL_COMMUNITY): Payer: Self-pay

## 2022-09-16 DIAGNOSIS — I2699 Other pulmonary embolism without acute cor pulmonale: Secondary | ICD-10-CM

## 2022-09-16 DIAGNOSIS — Z86711 Personal history of pulmonary embolism: Secondary | ICD-10-CM | POA: Diagnosis not present

## 2022-09-16 DIAGNOSIS — I82451 Acute embolism and thrombosis of right peroneal vein: Secondary | ICD-10-CM

## 2022-09-16 DIAGNOSIS — Z7901 Long term (current) use of anticoagulants: Secondary | ICD-10-CM

## 2022-09-16 LAB — PROTIME-INR
INR: 4.2 (ref 0.8–1.2)
Prothrombin Time: 40.4 seconds — ABNORMAL HIGH (ref 11.4–15.2)

## 2022-09-16 MED ORDER — WARFARIN SODIUM 5 MG PO TABS
5.0000 mg | ORAL_TABLET | Freq: Every day | ORAL | 1 refills | Status: DC
Start: 2022-09-16 — End: 2022-11-08
  Filled 2022-09-16: qty 30, 30d supply, fill #0
  Filled 2022-10-11: qty 30, 30d supply, fill #1

## 2022-09-16 NOTE — Telephone Encounter (Signed)
Critical value alert:   INR 4.2  Dr. Ellin Saba aware.  Orders received to have patient hold her coumadin tonight.  Begin taking 5mg  coumadin daily starting tomorrow night.    I spoke with patient and she verbalizes understanding.  New RX send to [facility]

## 2022-09-23 ENCOUNTER — Telehealth: Payer: Self-pay | Admitting: *Deleted

## 2022-09-23 ENCOUNTER — Encounter (HOSPITAL_COMMUNITY): Payer: Self-pay | Admitting: Emergency Medicine

## 2022-09-23 ENCOUNTER — Inpatient Hospital Stay: Payer: Commercial Managed Care - PPO

## 2022-09-23 ENCOUNTER — Emergency Department (HOSPITAL_COMMUNITY): Admission: EM | Admit: 2022-09-23 | Payer: Commercial Managed Care - PPO | Source: Home / Self Care

## 2022-09-23 ENCOUNTER — Other Ambulatory Visit: Payer: Self-pay

## 2022-09-23 DIAGNOSIS — I2699 Other pulmonary embolism without acute cor pulmonale: Secondary | ICD-10-CM

## 2022-09-23 DIAGNOSIS — Z7901 Long term (current) use of anticoagulants: Secondary | ICD-10-CM | POA: Insufficient documentation

## 2022-09-23 DIAGNOSIS — R202 Paresthesia of skin: Secondary | ICD-10-CM | POA: Diagnosis not present

## 2022-09-23 DIAGNOSIS — F17210 Nicotine dependence, cigarettes, uncomplicated: Secondary | ICD-10-CM | POA: Diagnosis not present

## 2022-09-23 DIAGNOSIS — J45909 Unspecified asthma, uncomplicated: Secondary | ICD-10-CM | POA: Diagnosis not present

## 2022-09-23 DIAGNOSIS — M79661 Pain in right lower leg: Secondary | ICD-10-CM | POA: Diagnosis not present

## 2022-09-23 DIAGNOSIS — R0602 Shortness of breath: Secondary | ICD-10-CM | POA: Diagnosis not present

## 2022-09-23 DIAGNOSIS — M79605 Pain in left leg: Secondary | ICD-10-CM | POA: Diagnosis not present

## 2022-09-23 DIAGNOSIS — M79604 Pain in right leg: Secondary | ICD-10-CM

## 2022-09-23 DIAGNOSIS — K760 Fatty (change of) liver, not elsewhere classified: Secondary | ICD-10-CM | POA: Diagnosis not present

## 2022-09-23 DIAGNOSIS — M79662 Pain in left lower leg: Secondary | ICD-10-CM | POA: Diagnosis not present

## 2022-09-23 DIAGNOSIS — I82451 Acute embolism and thrombosis of right peroneal vein: Secondary | ICD-10-CM

## 2022-09-23 LAB — PROTIME-INR
INR: 1.8 — ABNORMAL HIGH (ref 0.8–1.2)
Prothrombin Time: 21.1 seconds — ABNORMAL HIGH (ref 11.4–15.2)

## 2022-09-23 NOTE — ED Triage Notes (Addendum)
Pt to ed pov. C/o bilateral leg pain that started hurting more than normal yesterday. Pt had a verbal consult with pcp earlier today and was told to seek care at ER. Pt's INR and Ptt lab values are off and pt is concerned for another blood clot. Pt was hospitalized July 8th for a blood clot. Pt states feels the same. Pt ambulatory at this time Pt's legs warm to touch, no pain noted upon assessment. Pulses felt.

## 2022-09-23 NOTE — Telephone Encounter (Signed)
Patient notified of INR of 1.8 on 5 mg of coumadin nightly. Consulted with Dr. Ellin Saba regarding dose and will increase to 7.5 mg M,W,F and 5 mg all other days.  Patient has an appointment with cardiology on Thursday August 29 th and will establish with coumadin clinic at that time.  Will recheck INR on Monday and dose accordingly. Patient stated that she did have some calf pain last evening. Encouraged her to seek medical attention in the ER if this happens again.  Educated her on steady diet of vitamin K containing foods and to make sure that she is consistent with whatever she eats, as this will affect her levels.  Verbalized understanding.

## 2022-09-24 ENCOUNTER — Emergency Department (HOSPITAL_COMMUNITY): Payer: Commercial Managed Care - PPO

## 2022-09-24 DIAGNOSIS — M79605 Pain in left leg: Secondary | ICD-10-CM | POA: Diagnosis not present

## 2022-09-24 DIAGNOSIS — M79604 Pain in right leg: Secondary | ICD-10-CM | POA: Diagnosis not present

## 2022-09-24 DIAGNOSIS — K760 Fatty (change of) liver, not elsewhere classified: Secondary | ICD-10-CM | POA: Diagnosis not present

## 2022-09-24 LAB — CBC WITH DIFFERENTIAL/PLATELET
Abs Immature Granulocytes: 0.01 10*3/uL (ref 0.00–0.07)
Basophils Absolute: 0 10*3/uL (ref 0.0–0.1)
Basophils Relative: 1 %
Eosinophils Absolute: 0 10*3/uL (ref 0.0–0.5)
Eosinophils Relative: 1 %
HCT: 40.5 % (ref 36.0–46.0)
Hemoglobin: 13 g/dL (ref 12.0–15.0)
Immature Granulocytes: 0 %
Lymphocytes Relative: 34 %
Lymphs Abs: 2.2 10*3/uL (ref 0.7–4.0)
MCH: 30.7 pg (ref 26.0–34.0)
MCHC: 32.1 g/dL (ref 30.0–36.0)
MCV: 95.7 fL (ref 80.0–100.0)
Monocytes Absolute: 0.6 10*3/uL (ref 0.1–1.0)
Monocytes Relative: 9 %
Neutro Abs: 3.6 10*3/uL (ref 1.7–7.7)
Neutrophils Relative %: 55 %
Platelets: 233 10*3/uL (ref 150–400)
RBC: 4.23 MIL/uL (ref 3.87–5.11)
RDW: 13.3 % (ref 11.5–15.5)
WBC: 6.4 10*3/uL (ref 4.0–10.5)
nRBC: 0 % (ref 0.0–0.2)

## 2022-09-24 LAB — COMPREHENSIVE METABOLIC PANEL
ALT: 23 U/L (ref 0–44)
AST: 16 U/L (ref 15–41)
Albumin: 3.7 g/dL (ref 3.5–5.0)
Alkaline Phosphatase: 54 U/L (ref 38–126)
Anion gap: 10 (ref 5–15)
BUN: 17 mg/dL (ref 6–20)
CO2: 24 mmol/L (ref 22–32)
Calcium: 8.5 mg/dL — ABNORMAL LOW (ref 8.9–10.3)
Chloride: 106 mmol/L (ref 98–111)
Creatinine, Ser: 0.9 mg/dL (ref 0.44–1.00)
GFR, Estimated: >60 mL/min (ref >60)
Glucose, Bld: 155 mg/dL — ABNORMAL HIGH (ref 70–99)
Potassium: 3.6 mmol/L (ref 3.5–5.1)
Sodium: 140 mmol/L (ref 135–145)
Total Bilirubin: 0.6 mg/dL (ref 0.3–1.2)
Total Protein: 6.4 g/dL — ABNORMAL LOW (ref 6.5–8.1)

## 2022-09-24 LAB — PROTIME-INR
INR: 2 — ABNORMAL HIGH (ref 0.8–1.2)
Prothrombin Time: 22.5 seconds — ABNORMAL HIGH (ref 11.4–15.2)

## 2022-09-24 MED ORDER — IOHEXOL 350 MG/ML SOLN
100.0000 mL | Freq: Once | INTRAVENOUS | Status: AC | PRN
  Administered 2022-09-24: 100 mL via INTRAVENOUS

## 2022-09-24 NOTE — ED Provider Notes (Signed)
[place] EMERGENCY DEPARTMENT AT [facility] Provider Note  CSN: [id number] Arrival date & time: 09/23/22 2119  Chief Complaint(s) No chief complaint on file.  HPI Ruth Robertson is a 57 y.o. 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  Patient recently had an INR check with an INR of 1.8 and spoke with her hematologist about worsening shortness of breath and leg pain who recommended the patient come to the emergency department for further evaluation.  She states that pain is worse behind the calves but they extend up into the thighs and are associated with some mild paresthesias.  States that the shortness of breath is not significantly worse since hospital discharge but just has not gotten better.  Currently denies chest pain, abdominal pain, nausea, vomiting or other systemic symptoms.   Past Medical History Past Medical History:  Diagnosis Date   Anxiety    Asthma    ONLY WITH BRONCHITIS   GAD (generalized anxiety disorder)    GERD (gastroesophageal reflux disease)    Microscopic hematuria 12/03/2017   Negative cystoscopy negative CAT scan   Pneumonia    PONV (postoperative nausea and vomiting)    Patient Active Problem List   Diagnosis Date Noted   Acute pulmonary embolism (HCC) 08/12/2022   Acute deep vein thrombosis (DVT) of right peroneal vein (HCC) 08/07/2022   Sore throat 04/24/2022   Seronegative spondyloarthropathy 10/02/2021   Bradycardia 03/04/2021   Acute lower UTI 02/27/2021   Endometriosis 02/23/2021   History of cervical dysplasia 02/23/2021   Asthma 12/29/2020   Personal history of pneumonia (recurrent) 07/21/2020   Microscopic hematuria 12/03/2017   Lesion of liver 07/17/2017   Tobacco abuse 06/19/2017   Depression with anxiety 03/26/2011   Home  Medication(s) Prior to Admission medications   Medication Sig Start Date End Date Taking? Authorizing Provider  acetaminophen (TYLENOL) 500 MG tablet Take 500 mg by mouth every 6 (six) hours as needed for mild pain or moderate pain.    [provider]  enoxaparin (LOVENOX) 80 MG/0.8ML injection Inject 0.8 mLs (80 mg total) into the skin every 12 (twelve) hours for 5 days 08/13/22   Tyrone Nine, MD  enoxaparin (LOVENOX) 80 MG/0.8ML injection Inject 0.8 mLs (80 mg total) into the skin every 12 (twelve) hours for 10 days. 08/16/22 08/27/22  Levie Heritage, DO  ondansetron (ZOFRAN) 8 MG tablet Take 1 tablet (8 mg total) by mouth every 8 (eight) hours as needed for nausea. Patient not taking: Reported on 08/16/2022 01/24/21   Babs Sciara, MD  sulfaSALAzine (AZULFIDINE) 500 MG EC tablet Take 2 tablets (1,000 mg total) by mouth 2 (two) times daily with food 08/29/22     warfarin (COUMADIN) 5 MG tablet Take 1 tablet (5 mg total) by mouth daily. 09/16/22   Doreatha Massed, MD  Past Surgical History Past Surgical History:  Procedure Laterality Date   AGILE CAPSULE N/A 07/09/2016   Procedure: AGILE CAPSULE;  Surgeon: West Bali, MD;  Location: [facility];  Service: Endoscopy;  Laterality: N/A;  7:30am, pt to arrive at 7:00am   BIOPSY  09/19/2015   Procedure: BIOPSY;  Surgeon: West Bali, MD;  Location: [facility];  Service: Endoscopy;;  random colon bx's   COLONOSCOPY WITH PROPOFOL N/A 09/19/2015   Dr. Darrick Penna: five 2-3 mm polyps in rectum (hyperplastic) and descending colon, diverticulosis in sigmoid colon and transverse colon. Query post-infectious IBS. Next colonoscopy in 10 years    CYST EXCISION N/A 01/20/2019   Procedure: EXCISION OF RIGHT GROIN SEBACEOUS CYST;  Surgeon: Harriette Bouillon, MD;  Location: [facility];  Service: General;   Laterality: N/A;   GIVENS CAPSULE STUDY N/A 07/30/2016   Procedure: GIVENS CAPSULE STUDY;  Surgeon: West Bali, MD;  Location: [facility];  Service: Endoscopy;  Laterality: N/A;   HYSTEROSCOPY  2006   POLYPECTOMY  09/19/2015   Procedure: POLYPECTOMY;  Surgeon: West Bali, MD;  Location: [facility];  Service: Endoscopy;;  descending colon polyps x3 cold bx, rectal polyp x2   SALPINGOOPHORECTOMY Bilateral 08/18/2012   Procedure: SALPINGO OOPHORECTOMY;  Surgeon: Dara Lords, MD;  Location: [facility];  Service: Gynecology;  Laterality: Bilateral;   VAGINAL HYSTERECTOMY N/A 08/18/2012   Procedure: HYSTERECTOMY VAGINAL;  Surgeon: Dara Lords, MD;  Location: [facility];  Service: Gynecology;  Laterality: N/A;   Family History Family History  Problem Relation Age of Onset   Heart attack Mother    Heart failure Father    Stroke Father    Hypertension Maternal Grandmother    Diabetes Paternal Grandfather    Breast cancer Maternal Aunt        50's   Colon cancer Maternal Aunt        late 36s or 69s   Inflammatory bowel disease Neg Hx     Social History Social History   Tobacco Use   Smoking status: Every Day    Current packs/day: 1.00    Average packs/day: 1 pack/day for 25.0 years (25.0 ttl pk-yrs)    Types: Cigarettes   Smokeless tobacco: Never  Vaping Use   Vaping status: Never Used  Substance Use Topics   Alcohol use: Not Currently    Alcohol/week: 0.0 standard drinks of alcohol    Comment: rare   Drug use: No   Allergies Patient has no known allergies.  Review of Systems Review of Systems  Respiratory:  Positive for shortness of breath.   Musculoskeletal:  Positive for arthralgias and myalgias.    Physical Exam Vital Signs  I have reviewed the triage vital signs BP 121/84   Pulse 64   Temp 98.5 F (36.9 C) (Oral)   Resp 17   Ht 5\' 5"  (1.651 m)   Wt 82.6 kg   LMP 06/12/2012   SpO2 96%   BMI 30.29 kg/m   Physical Exam Vitals and nursing  note reviewed.  Constitutional:      General: She is not in acute distress.    Appearance: She is well-developed.  HENT:     Head: Normocephalic and atraumatic.  Eyes:     Conjunctiva/sclera: Conjunctivae normal.  Cardiovascular:     Rate and Rhythm: Normal rate and regular rhythm.     Heart sounds: No murmur heard. Pulmonary:     Effort: Pulmonary effort is normal.  No respiratory distress.     Breath sounds: Normal breath sounds.  Abdominal:     Palpations: Abdomen is soft.     Tenderness: There is no abdominal tenderness.  Musculoskeletal:        General: No swelling.     Cervical back: Neck supple.  Skin:    General: Skin is warm and dry.     Capillary Refill: Capillary refill takes less than 2 seconds.  Neurological:     Mental Status: She is alert.  Psychiatric:        Mood and Affect: Mood normal.     ED Results and Treatments Labs (all labs ordered are listed, but only abnormal results are displayed) Labs Reviewed  COMPREHENSIVE METABOLIC PANEL - Abnormal; Notable for the following components:      Result Value   Glucose, Bld 155 (*)    Calcium 8.5 (*)    Total Protein 6.4 (*)    All other components within normal limits  PROTIME-INR - Abnormal; Notable for the following components:   Prothrombin Time 22.5 (*)    INR 2.0 (*)    All other components within normal limits  CBC WITH DIFFERENTIAL/PLATELET                                                                                                                          Radiology CT Angio Chest PE W and/or Wo Contrast  Result Date: 09/24/2022 CLINICAL DATA:  Bilateral leg pain. Concern for proximal DVT at bifurcation. Is tree of hospitalization for blood clot. This feels the same as previous clots. PE suspected. Rule out breakthrough clot. EXAM: CT ANGIOGRAPHY CHEST CT ABDOMEN AND PELVIS WITH CONTRAST TECHNIQUE: Multidetector CT imaging of the chest was performed using the standard protocol during bolus  administration of intravenous contrast. Multiplanar CT image reconstructions and MIPs were obtained to evaluate the vascular anatomy. Multidetector CT imaging of the abdomen and pelvis was performed using the standard protocol during bolus administration of intravenous contrast. RADIATION DOSE REDUCTION: This exam was performed according to the departmental dose-optimization program which includes automated exposure control, adjustment of the mA and/or kV according to patient size and/or use of iterative reconstruction technique. CONTRAST:  OMNIPAQUE IOHEXOL 350 MG/ML SOLN COMPARISON:  CTA chest 08/12/2022 and CT abdomen and pelvis 02/27/2021 FINDINGS: CTA CHEST FINDINGS Cardiovascular: Normal heart size. No pericardial effusion. No aortic aneurysm or dissection. No acute pulmonary embolism. Mediastinum/Nodes: Trachea and esophagus are unremarkable. No thoracic adenopathy. Lungs/Pleura: Lungs are clear. No pleural effusion or pneumothorax. Musculoskeletal: No acute fracture. Review of the MIP images confirms the above findings. CT ABDOMEN and PELVIS FINDINGS Hepatobiliary: Hepatic steatosis. Normal gallbladder. No biliary dilation. Pancreas: Unremarkable. Spleen: Unremarkable. Adrenals/Urinary Tract: Normal adrenal glands. No urinary calculi or hydronephrosis. Bladder is unremarkable. Stomach/Bowel: Normal caliber large and small bowel. No bowel wall thickening. The appendix is normal.Stomach is within normal limits. Vascular/Lymphatic: No significant vascular findings are present. No evidence of venous thromboembolism in the IVC  or visualized lower extremity veins. No enlarged abdominal or pelvic lymph nodes. Reproductive: Unremarkable. Other: No free intraperitoneal fluid or air. Musculoskeletal: No acute fracture. IMPRESSION: 1. Negative for acute pulmonary embolism. 2. No evidence of venous thromboembolism in the IVC or visualized lower extremity veins. 3. No acute findings in the chest, abdomen, or  pelvis. 4. Hepatic steatosis. Electronically Signed   By: Minerva Fester M.D.   On: 09/24/2022 02:58   CT ABDOMEN PELVIS W CONTRAST  Result Date: 09/24/2022 CLINICAL DATA:  Bilateral leg pain. Concern for proximal DVT at bifurcation. Is tree of hospitalization for blood clot. This feels the same as previous clots. PE suspected. Rule out breakthrough clot. EXAM: CT ANGIOGRAPHY CHEST CT ABDOMEN AND PELVIS WITH CONTRAST TECHNIQUE: Multidetector CT imaging of the chest was performed using the standard protocol during bolus administration of intravenous contrast. Multiplanar CT image reconstructions and MIPs were obtained to evaluate the vascular anatomy. Multidetector CT imaging of the abdomen and pelvis was performed using the standard protocol during bolus administration of intravenous contrast. RADIATION DOSE REDUCTION: This exam was performed according to the departmental dose-optimization program which includes automated exposure control, adjustment of the mA and/or kV according to patient size and/or use of iterative reconstruction technique. CONTRAST:  OMNIPAQUE IOHEXOL 350 MG/ML SOLN COMPARISON:  CTA chest 08/12/2022 and CT abdomen and pelvis 02/27/2021 FINDINGS: CTA CHEST FINDINGS Cardiovascular: Normal heart size. No pericardial effusion. No aortic aneurysm or dissection. No acute pulmonary embolism. Mediastinum/Nodes: Trachea and esophagus are unremarkable. No thoracic adenopathy. Lungs/Pleura: Lungs are clear. No pleural effusion or pneumothorax. Musculoskeletal: No acute fracture. Review of the MIP images confirms the above findings. CT ABDOMEN and PELVIS FINDINGS Hepatobiliary: Hepatic steatosis. Normal gallbladder. No biliary dilation. Pancreas: Unremarkable. Spleen: Unremarkable. Adrenals/Urinary Tract: Normal adrenal glands. No urinary calculi or hydronephrosis. Bladder is unremarkable. Stomach/Bowel: Normal caliber large and small bowel. No bowel wall thickening. The appendix is  normal.Stomach is within normal limits. Vascular/Lymphatic: No significant vascular findings are present. No evidence of venous thromboembolism in the IVC or visualized lower extremity veins. No enlarged abdominal or pelvic lymph nodes. Reproductive: Unremarkable. Other: No free intraperitoneal fluid or air. Musculoskeletal: No acute fracture. IMPRESSION: 1. Negative for acute pulmonary embolism. 2. No evidence of venous thromboembolism in the IVC or visualized lower extremity veins. 3. No acute findings in the chest, abdomen, or pelvis. 4. Hepatic steatosis. Electronically Signed   By: Minerva Fester M.D.   On: 09/24/2022 02:58    Pertinent labs & imaging results that were available during my care of the patient were reviewed by me and considered in my medical decision making (see MDM for details).  Medications Ordered in ED Medications  iohexol (OMNIPAQUE) 350 MG/ML injection 100 mL (100 mLs Intravenous Contrast Given 09/24/22 0203)  Procedures Procedures  (including critical care time)  Medical Decision Making / ED Course   This patient presents to the ED for concern of leg pain and shortness of breath, this involves an extensive number of treatment options, and is a complaint that carries with it a high risk of complications and morbidity.  The differential diagnosis includes proximal DVT, PE, subtherapeutic INR, pneumonia, neuropathy  MDM: Patient seen emergency room for evaluation of multiple complaints as scribed above.  Physical exam is largely unremarkable with no obvious external evidence of phlegmasia, mild tenderness behind the calves, cardiopulmonary exam unremarkable, no appreciable swelling of the lower extremities.  Laboratory evaluation largely unremarkable and INR is therapeutic at 2.0.  As we do not have ultrasound available at night, she received  advanced CT imaging including PE study which was reassuringly negative for worsening clot burden or acute PE and we used a CT abdomen pelvis to evaluate for proximal clot given extensive clot burden on previous ultrasound and now bilateral lower extremity symptoms.  This was also reassuringly negative.  I did offer to have the patient return to the emergency in the morning for DVT ultrasounds but she has a follow-up with the vascular center in 72 hours.  I encouraged her to continue taking her warfarin as currently prescribed as she is now therapeutic and at this time she does not meet inpatient criteria for admission and she is safe for discharge with outpatient follow-up.   Additional history obtained: -Additional history obtained from mother -External records from outside source obtained and reviewed including: Chart review including previous notes, labs, imaging, consultation notes   Lab Tests: -I ordered, reviewed, and interpreted labs.   The pertinent results include:   Labs Reviewed  COMPREHENSIVE METABOLIC PANEL - Abnormal; Notable for the following components:      Result Value   Glucose, Bld 155 (*)    Calcium 8.5 (*)    Total Protein 6.4 (*)    All other components within normal limits  PROTIME-INR - Abnormal; Notable for the following components:   Prothrombin Time 22.5 (*)    INR 2.0 (*)    All other components within normal limits  CBC WITH DIFFERENTIAL/PLATELET    Imaging Studies ordered: I ordered imaging studies including CT PE, CTAP I independently visualized and interpreted imaging. I agree with the radiologist interpretation   Medicines ordered and prescription drug management: Meds ordered this encounter  Medications   iohexol (OMNIPAQUE) 350 MG/ML injection 100 mL    -I have reviewed the patients home medicines and have made adjustments as needed  Critical interventions none    Cardiac Monitoring: The patient was maintained on a cardiac monitor.  I  personally viewed and interpreted the cardiac monitored which showed an underlying rhythm of: NSR  Social Determinants of Health:  Factors impacting patients care include: none   Reevaluation: After the interventions noted above, I reevaluated the patient and found that they have :stayed the same  Co morbidities that complicate the patient evaluation  Past Medical History:  Diagnosis Date   Anxiety    Asthma    ONLY WITH BRONCHITIS   GAD (generalized anxiety disorder)    GERD (gastroesophageal reflux disease)    Microscopic hematuria 12/03/2017   Negative cystoscopy negative CAT scan   Pneumonia    PONV (postoperative nausea and vomiting)       Dispostion: I considered admission for this patient, but at this time she does not meet inpatient criteria for admission and she is  safe for discharge with outpatient follow-up     Final Clinical Impression(s) / ED Diagnoses Final diagnoses:  Pain in both lower extremities     @PCDICTATION @    Glendora Score, MD 09/24/22 1704

## 2022-09-25 ENCOUNTER — Telehealth: Payer: Self-pay

## 2022-09-25 NOTE — Transitions of Care (Post Inpatient/ED Visit) (Signed)
   09/25/2022  Name: Ruth Robertson MRN: [id number] DOB: Sep 20, 1965  Today's TOC FU Call Status: Today's TOC FU Call Status:: Successful TOC FU Call Completed TOC FU Call Complete Date: 09/25/22  Transition Care Management Follow-up Telephone Call Date of Discharge: 09/24/22 [facility] Type of Discharge: Emergency Department Reason for ED Visit: Other: (right leg pain) How have you been since you were released from the hospital?: Better Any questions or concerns?: No  Items Reviewed: Did you receive and understand the discharge instructions provided?: Yes Any new allergies since your discharge?: No Dietary orders reviewed?: Yes Do you have support at home?: No  Medications Reviewed Today: Medications Reviewed Today     Reviewed by Karena Addison, LPN (Licensed Practical Nurse) on 09/25/22 at 1354  Med List Status: <None>   Medication Order Taking? Sig Documenting Provider Last Dose Status Informant  acetaminophen (TYLENOL) 500 MG tablet [id number] No Take 500 mg by mouth every 6 (six) hours as needed for mild pain or moderate pain. [provider] Taking Active   enoxaparin (LOVENOX) 80 MG/0.8ML injection [id number] No Inject 0.8 mLs (80 mg total) into the skin every 12 (twelve) hours for 5 days Tyrone Nine, MD Taking Active   enoxaparin (LOVENOX) 80 MG/0.8ML injection [id number]  Inject 0.8 mLs (80 mg total) into the skin every 12 (twelve) hours for 10 days. Levie Heritage, DO  Expired 08/27/22 2359   ondansetron (ZOFRAN) 8 MG tablet [id number] No Take 1 tablet (8 mg total) by mouth every 8 (eight) hours as needed for nausea.  Patient not taking: Reported on 08/16/2022   Babs Sciara, MD Not Taking Active Self  sulfaSALAzine (AZULFIDINE) 500 MG EC tablet [id number]  Take 2 tablets (1,000 mg total) by mouth 2 (two) times daily with food   Active   warfarin (COUMADIN) 5 MG tablet [id number]  Take 1 tablet (5 mg total) by mouth daily.  Doreatha Massed, MD  Active            Med Note Izola Price, [place] A   Mon Sep 23, 2022  2:18 PM) 7.5 mg M,W,F and 5 mg all other days as of 09/23/22            Home Care and Equipment/Supplies: Were Home Health Services Ordered?: NA Any new equipment or medical supplies ordered?: NA  Functional Questionnaire: Do you need assistance with bathing/showering or dressing?: No Do you need assistance with meal preparation?: No Do you need assistance with eating?: No Do you have difficulty maintaining continence: No Do you need assistance with getting out of bed/getting out of a chair/moving?: No Do you have difficulty managing or taking your medications?: No  Follow up appointments reviewed: PCP Follow-up appointment confirmed?: NA Specialist Hospital Follow-up appointment confirmed?: Yes Date of Specialist follow-up appointment?: 09/26/22 Follow-Up Specialty Provider:: vascular Do you need transportation to your follow-up appointment?: No Do you understand care options if your condition(s) worsen?: Yes-patient verbalized understanding    SIGNATURE Karena Addison, LPN [facility] Nurse Health Advisor Direct Dial [phone]

## 2022-09-26 ENCOUNTER — Ambulatory Visit (HOSPITAL_BASED_OUTPATIENT_CLINIC_OR_DEPARTMENT_OTHER)
Admission: RE | Admit: 2022-09-26 | Discharge: 2022-09-26 | Disposition: A | Discharge status: Home / Self Care | Payer: Commercial Managed Care - PPO | Source: Ambulatory Visit | Attending: Surgery | Admitting: Surgery

## 2022-09-26 ENCOUNTER — Encounter (HOSPITAL_COMMUNITY): Payer: Self-pay

## 2022-09-26 ENCOUNTER — Ambulatory Visit (HOSPITAL_COMMUNITY)
Admission: RE | Admit: 2022-09-26 | Discharge: 2022-09-26 | Disposition: A | Discharge status: Home / Self Care | Payer: Commercial Managed Care - PPO | Source: Ambulatory Visit | Attending: Surgery | Admitting: Surgery

## 2022-09-26 VITALS — BP 118/81 | HR 74

## 2022-09-26 DIAGNOSIS — I2699 Other pulmonary embolism without acute cor pulmonale: Secondary | ICD-10-CM | POA: Insufficient documentation

## 2022-09-26 DIAGNOSIS — I82451 Acute embolism and thrombosis of right peroneal vein: Secondary | ICD-10-CM | POA: Diagnosis not present

## 2022-09-26 NOTE — Progress Notes (Signed)
VASCULAR LAB    Bilateral lower extremity venous duplex has been performed.  See CV proc for preliminary results.   Chelly Dombeck, RVT 09/26/2022, 9:58 AM

## 2022-09-26 NOTE — Patient Instructions (Signed)
-  Continue warfarin as prescribed by Dr. Ellin Saba.  -We are checking bilateral ultrasounds today to rule out any new clot.  -It is important to take your medication around the same time every day.  -Avoid NSAIDs like ibuprofen (Advil, Motrin) and naproxen (Aleve) as well as aspirin doses over 100 mg daily. -Tylenol (acetaminophen) is the preferred over the counter pain medication to lower the risk of bleeding. -Be sure to alert all of your health care providers that you are taking an anticoagulant prior to starting a new medication or having a procedure. -Monitor for signs and symptoms of bleeding (abnormal bruising, prolonged bleeding, nose bleeds, bleeding from gums, discolored urine, black tarry stools). If you have fallen and hit your head OR if your bleeding is severe or not stopping, seek emergency care.  -Go to the emergency room if emergent signs and symptoms of new clot occur (new or worse swelling and pain in an arm or leg, shortness of breath, chest pain, fast or irregular heartbeats, lightheadedness, dizziness, fainting, coughing up blood) or if you experience a significant color change (pale or blue) in the extremity that has the DVT.  -We recommend you wear compression stockings (20-30 mmHg) as long as you are having swelling or pain. Be sure to purchase the correct size and take them off at night.   If you have any questions or need to reschedule an appointment, please call [phone] [facility].  If you are having an emergency, call 911 or present to the nearest emergency room.   What is a DVT?  -Deep vein thrombosis (DVT) is a condition in which a blood clot forms in a vein of the deep venous system which can occur in the lower leg, thigh, pelvis, arm, or neck. This condition is serious and can be life-threatening if the clot travels to the arteries of the lungs and causing a blockage (pulmonary embolism, PE). A DVT can also damage veins in the leg, which can lead to long-term venous  disease, leg pain, swelling, discoloration, and ulcers or sores (post-thrombotic syndrome).  -Treatment may include taking an anticoagulant medication to prevent more clots from forming and the current clot from growing, wearing compression stockings, and/or surgical procedures to remove or dissolve the clot.

## 2022-09-26 NOTE — Progress Notes (Addendum)
DVT Clinic Note  Name: Ruth Robertson     MRN: [id number]     DOB: 1965-03-02     Sex: female  PCP: Babs Sciara, MD  Today's Visit: Visit Information: Follow Up Visit  Referred to DVT Clinic by: Emergency Department - Roxy Horseman, PA-C  Referred to CPP by: Dr. Myra Gianotti Reason for referral:  Chief Complaint  Patient presents with   Med Management - DVT   HISTORY OF PRESENT ILLNESS: Ruth Robertson is a 57 y.o. 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 RLE pain began the day after she returned from the beach (4 hour drive 7/82/95 & 07/25/28 to and from [facility]). 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 She had no history of DVT previously. She had no recent arthritic or GI flares. No recent injuries. She currently smokes less than 1 ppd. She reported being up to date on cancer screenings. Last seen in DVT Clinic 08/07/22 at which time Eliquis starter pack was started. She then developed pleuritic pain on 08/12/22 and was diagnosed with an acute PE. Hematology switched her from Eliquis to warfarin. Hematology has been managing her INRs until she can establish with a cardiologist and their Coumadin Clinic. Hypercoagulable workup thus far has been negative. The patient presented to the ED 09/23/22 due to worsening pain in her RLE and new pain in the LLE as well as persistent SOB. Her INR that day was 1.8 and hematology instructed her to take a boosted dose of warfarin. She reported her legs hurting badly the night before, and they instructed her to present to the ED if the pain returned. INR in the ED was 2.0. CT angio showed no acute PE, and CT abdomen pelvis showed no  DVT in the IVC or visualized proximal veins. Vascular imaging was closed, so plans were made for [place] to further evaluate her today.   Today, patient is ambulating independently and is accompanied by her father. She reports that she is still having leg pain bilaterally. RLE pain initially improved last month but has worsened and is now in her upper thigh in addition to her calf. Left calf pain is also new. Denies abnormal bleeding or bruising. Denies missed doses of warfarin. Says when her INR was high her leg felt light, and when it was low it felt heavy. Endorses wearing compression stockings with improvement in swelling. Reports she purchased an under the desk elliptical to keep her legs moving when she's working from home.   Positive Thrombotic Risk Factors: Smoking, Other (comment) (Recent 4 hour car ride prior to symptom onset) Bleeding Risk Factors: Anticoagulant therapy  Negative Thrombotic Risk Factors: Previous VTE, Recent surgery (within 3 months), Recent trauma (within 3 months), Recent admission to hospital with acute illness (within 3 months), Paralysis, paresis, or recent plaster cast immobilization of lower extremity, Central venous catheterization, Bed rest >72 hours within 3 months, Sedentary journey lasting >8 hours within 4 weeks, Pregnancy, Within 6 weeks postpartum, Recent cesarean section (within 3 months), Estrogen therapy, Testosterone therapy, Erythropoiesis-stimulating agent, Recent COVID diagnosis (within 3 months), Active cancer, Non-malignant, chronic inflammatory condition, Known thrombophilic condition, Obesity, Older age  Rx Insurance Coverage: Commercial Rx Affordability: Eliquis is $110/month. Filled at last visit using one time  free card. Provided with coupon to reduce refill cost to $10/month. Now on warfarin, which is affordable.  Rx Assistance Provided:  Free 30-day trial card Co-pay card Preferred Pharmacy: Refills available at [facility].    Past Medical History:  Diagnosis Date   Anxiety    Asthma    ONLY WITH BRONCHITIS   GAD (generalized anxiety disorder)    GERD (gastroesophageal reflux disease)    Microscopic hematuria 12/03/2017   Negative cystoscopy negative CAT scan   Pneumonia    PONV (postoperative nausea and vomiting)     Past Surgical History:  Procedure Laterality Date   AGILE CAPSULE N/A 07/09/2016   Procedure: AGILE CAPSULE;  Surgeon: West Bali, MD;  Location: [facility];  Service: Endoscopy;  Laterality: N/A;  7:30am, pt to arrive at 7:00am   BIOPSY  09/19/2015   Procedure: BIOPSY;  Surgeon: West Bali, MD;  Location: [facility];  Service: Endoscopy;;  random colon bx's   COLONOSCOPY WITH PROPOFOL N/A 09/19/2015   Dr. Darrick Penna: five 2-3 mm polyps in rectum (hyperplastic) and descending colon, diverticulosis in sigmoid colon and transverse colon. Query post-infectious IBS. Next colonoscopy in 10 years    CYST EXCISION N/A 01/20/2019   Procedure: EXCISION OF RIGHT GROIN SEBACEOUS CYST;  Surgeon: Harriette Bouillon, MD;  Location: [facility];  Service: General;  Laterality: N/A;   GIVENS CAPSULE STUDY N/A 07/30/2016   Procedure: GIVENS CAPSULE STUDY;  Surgeon: West Bali, MD;  [facility];  Service: Endoscopy;  Laterality: N/A;   HYSTEROSCOPY  2006   POLYPECTOMY  09/19/2015   Procedure: POLYPECTOMY;  Surgeon: West Bali, MD;  [facility];  Service: Endoscopy;;  descending colon polyps x3 cold bx, rectal polyp x2   SALPINGOOPHORECTOMY Bilateral 08/18/2012   Procedure: SALPINGO OOPHORECTOMY;  Surgeon: Dara Lords, MD;  Location: [facility];  Service: Gynecology;  Laterality: Bilateral;   VAGINAL HYSTERECTOMY N/A 08/18/2012   Procedure: HYSTERECTOMY VAGINAL;  Surgeon: Dara Lords, MD;  Location: [facility];  Service: Gynecology;  Laterality: N/A;    Social History   Socioeconomic History   Marital status: Divorced    Spouse name: Not on file    Number of children: Not on file   Years of education: Not on file   Highest education level: Not on file  Occupational History   Not on file  Tobacco Use   Smoking status: Every Day    Current packs/day: 1.00    Average packs/day: 1 pack/day for 25.0 years (25.0 ttl pk-yrs)    Types: Cigarettes   Smokeless tobacco: Never  Vaping Use   Vaping status: Never Used  Substance and Sexual Activity   Alcohol use: Not Currently    Alcohol/week: 0.0 standard drinks of alcohol    Comment: rare   Drug use: No   Sexual activity: Not Currently    Partners: Male    Comment: HYST-1st intercourse 22 yo-5 partners  Other Topics Concern   Not on file  Social History Narrative   Not on file   Social Determinants of Health   Financial Resource Strain: Not on file  Food Insecurity: No Food Insecurity (08/12/2022)   Hunger Vital Sign    Worried About Running Out of Food in the Last Year: Never true    Ran Out of Food in the Last Year: Never true  Transportation Needs: No Transportation Needs (08/12/2022)   PRAPARE - Transportation    Lack of  Transportation (Medical): No    Lack of Transportation (Non-Medical): No  Physical Activity: Not on file  Stress: Not on file  Social Connections: Not on file  Intimate Partner Violence: Not At Risk (08/12/2022)   Humiliation, Afraid, Rape, and Kick questionnaire    Fear of Current or Ex-Partner: No    Emotionally Abused: No    Physically Abused: No    Sexually Abused: No    Family History  Problem Relation Age of Onset   Heart attack Mother    Heart failure Father    Stroke Father    Hypertension Maternal Grandmother    Diabetes Paternal Grandfather    Breast cancer Maternal Aunt        50's   Colon cancer Maternal Aunt        late 32s or 70s   Inflammatory bowel disease Neg Hx     Allergies as of 09/26/2022   (No Known Allergies)    Current Outpatient Medications on File Prior to Encounter  Medication Sig Dispense Refill   acetaminophen  (TYLENOL) 500 MG tablet Take 500 mg by mouth every 6 (six) hours as needed for mild pain or moderate pain.     sulfaSALAzine (AZULFIDINE) 500 MG EC tablet Take 2 tablets (1,000 mg total) by mouth 2 (two) times daily with food 120 tablet 1   warfarin (COUMADIN) 5 MG tablet Take 1 tablet (5 mg total) by mouth daily. 30 tablet 1   enoxaparin (LOVENOX) 80 MG/0.8ML injection Inject 0.8 mLs (80 mg total) into the skin every 12 (twelve) hours for 5 days (Patient not taking: Reported on 09/26/2022) 8 mL 0   enoxaparin (LOVENOX) 80 MG/0.8ML injection Inject 0.8 mLs (80 mg total) into the skin every 12 (twelve) hours for 10 days. (Patient not taking: Reported on 09/26/2022) 16 mL 1   ondansetron (ZOFRAN) 8 MG tablet Take 1 tablet (8 mg total) by mouth every 8 (eight) hours as needed for nausea. (Patient not taking: Reported on 08/16/2022) 15 tablet 2   No current facility-administered medications on file prior to encounter.   REVIEW OF SYSTEMS:  Review of Systems  Respiratory:  Positive for shortness of breath.   Cardiovascular:  Negative for chest pain, palpitations and leg swelling.  Musculoskeletal:  Positive for myalgias.  Neurological:  Positive for tingling. Negative for dizziness.   PHYSICAL EXAMINATION:  Vitals:   09/26/22 0854  BP: 118/81  Pulse: 74  SpO2: 98%   Physical Exam Vitals reviewed.  Cardiovascular:     Rate and Rhythm: Normal rate.  Pulmonary:     Effort: Pulmonary effort is normal.  Musculoskeletal:        General: Tenderness present. No swelling.  Skin:    Findings: No bruising or erythema.   Villalta Score for Post-Thrombotic Syndrome: Pain: Mild Cramps: Mild Heaviness: Mild Paresthesia: Mild Pruritus: Absent Pretibial Edema: Absent Skin Induration: Absent Hyperpigmentation: Absent Redness: Absent Venous Ectasia: Absent Pain on calf compression: Mild Villalta Preliminary Score: 5 Is venous ulcer present?: No If venous ulcer is present and score is <15, then  15 points total are assigned: Absent Villalta Total Score: 5  LABS:  CBC     Component Value Date/Time   WBC 6.4 09/24/2022 0123   RBC 4.23 09/24/2022 0123   HGB 13.0 09/24/2022 0123   HGB 15.1 04/24/2021 0746   HCT 40.5 09/24/2022 0123   HCT 46.0 04/24/2021 0746   PLT 233 09/24/2022 0123   PLT 294 04/24/2021 0746   MCV 95.7 09/24/2022  0123   MCV 93 04/24/2021 0746   MCH 30.7 09/24/2022 0123   MCHC 32.1 09/24/2022 0123   RDW 13.3 09/24/2022 0123   RDW 12.8 04/24/2021 0746   LYMPHSABS 2.2 09/24/2022 0123   LYMPHSABS 2.0 04/24/2021 0746   MONOABS 0.6 09/24/2022 0123   EOSABS 0.0 09/24/2022 0123   EOSABS 0.3 04/24/2021 0746   BASOSABS 0.0 09/24/2022 0123   BASOSABS 0.1 04/24/2021 0746    Hepatic Function      Component Value Date/Time   PROT 6.4 (L) 09/24/2022 0123   PROT 6.5 04/24/2021 0746   ALBUMIN 3.7 09/24/2022 0123   ALBUMIN 3.9 04/24/2021 0746   AST 16 09/24/2022 0123   ALT 23 09/24/2022 0123   ALKPHOS 54 09/24/2022 0123   BILITOT 0.6 09/24/2022 0123   BILITOT 0.2 04/24/2021 0746   BILIDIR 0.1 06/28/2016 1135   BILIDIR 0.10 06/27/2016 0851   IBILI 0.2 (L) 06/28/2016 1135    Renal Function   Lab Results  Component Value Date   CREATININE 0.90 09/24/2022   CREATININE 1.01 (H) 08/13/2022   CREATININE 1.02 (H) 08/12/2022    Estimated Creatinine Clearance: 74 mL/min (by C-G formula based on SCr of 0.9 mg/dL).   VVS Vascular Lab Studies:  08/07/22 VAS [place] LOWER EXTREMITY VENOUS (DVT)RIGHT  Summary:  RIGHT:  - Findings consistent with acute deep vein thrombosis involving the right  peroneal veins, right soleal veins, and right gastrocnemius veins.  - No cystic structure found in the popliteal fossa.    LEFT:  - No evidence of common femoral vein obstruction.   09/26/22 VAS [place] LOWER EXTREMITY VENOUS BILAT (DVT)  Summary:  BILATERAL:  -No evidence of popliteal cyst, bilaterally.  RIGHT:  - Findings consistent with age indeterminate deep vein thrombosis   involving the right peroneal veins.  - Findings appear improved from previous examination.    LEFT:  - No evidence of deep vein thrombosis in the lower extremity. No indirect  evidence of obstruction proximal to the inguinal ligament.   ASSESSMENT: Location of DVT: Right distal vein VTE risk factors include 4 [place] drive [place] the beach immediately preceding symptom onset. Due to this being a minor risk factor for DVT, she was referred to hematology for further work up and recommendations on duration of anticoagulation. She was diagnosed with an acute DVT involving the right peroneal vein and muscular vein thrombosis involving the right soleal and gastrocnemius veins. She was started on Eliquis during last visit in DVT Clinic on 08/07/22. On 08/12/22, she was diagnosed with an acute PE, and hematology switched her from Eliquis to warfarin. Hematology has been managing her INRs until she can establish with a cardiologist and their Coumadin Clinic. Hypercoagulable workup thus far has been negative. The patient presented to the ED 09/23/22 due to worsening pain in her RLE and new pain in the LLE as well as persistent SOB. CT angio showed no acute PE, and CT abdomen pelvis showed no DVT in the IVC or visualized proximal veins. Vascular imaging was closed, so plans were made for [place] to further evaluate her today.   The patient is still experiencing bilateral LE pain today. I discussed the patient with Dr. Myra Gianotti, who recommended repeating ultrasound to check for clot propagation, and I was able to get her worked in at [employer] during her visit. Ultrasound showed no acute DVT. The right gastrocnemius and soleal clots are completely resolved. One of the paired peroneal veins has subacute to nearly chronic appearing DVT.  No DVT in the left lower extremity. I discussed these results with the patient who has no further questions at this time. Unclear what the cause of this new pain is, but it is reassuring to see  clot improvement/resolution and no new clot propagation. Continue warfarin as directed by hematology. She will follow up with hematology as otherwise planned and PCP for further work up if needed.   PLAN: -Continue warfarin, dosed per hematology (INR goal 2-3).  -Expected duration of therapy: per hematology. Therapy started on 08/12/22. -Patient educated on purpose, proper use and potential adverse effects of warfarin (Coumadin). -Counseled on dietary and drug interactions of warfarin. -Discussed importance of taking medication around the same time every day. -Advised patient of medications to avoid (NSAIDs, aspirin doses >100 mg daily). -Educated that Tylenol (acetaminophen) is the preferred analgesic to lower the risk of bleeding. -Advised patient to alert all providers of anticoagulation therapy prior to starting a new medication or having a procedure. -Emphasized importance of monitoring for signs and symptoms of bleeding (abnormal bruising, prolonged bleeding, nose bleeds, bleeding from gums, discolored urine, black tarry stools). -Educated patient to present to the ED if emergent signs and symptoms of new thrombosis occur. -Counseled patient to wear compression stockings daily, removing at night.  Follow up: with PCP and hematology. DVT Clinic as needed.   Pervis Hocking, PharmD, [place], CPP Deep Vein Thrombosis Clinic Clinical Pharmacist Practitioner Office: [phone]  I have evaluated the patient's chart/imaging and refer this patient to the Clinical Pharmacist Practitioner for medication management. I have reviewed the CPP's documentation and agree with her assessment and plan. I was immediately available during the visit for questions and collaboration.   Durene Cal, MD

## 2022-09-30 ENCOUNTER — Inpatient Hospital Stay: Payer: Commercial Managed Care - PPO

## 2022-09-30 DIAGNOSIS — Z86711 Personal history of pulmonary embolism: Secondary | ICD-10-CM | POA: Diagnosis not present

## 2022-09-30 DIAGNOSIS — I82451 Acute embolism and thrombosis of right peroneal vein: Secondary | ICD-10-CM

## 2022-09-30 DIAGNOSIS — I2699 Other pulmonary embolism without acute cor pulmonale: Secondary | ICD-10-CM

## 2022-09-30 DIAGNOSIS — Z7901 Long term (current) use of anticoagulants: Secondary | ICD-10-CM

## 2022-09-30 LAB — PROTIME-INR
INR: 2.3 — ABNORMAL HIGH (ref 0.8–1.2)
Prothrombin Time: 25.9 seconds — ABNORMAL HIGH (ref 11.4–15.2)

## 2022-10-01 ENCOUNTER — Telehealth: Payer: Self-pay | Admitting: *Deleted

## 2022-10-01 NOTE — Telephone Encounter (Signed)
Patient aware to continue coumadin 7.5 mg M,W,F and 5 mg all other days.  Has an appointment with cardiology on Thursday to enroll in coumadin clinic.  INR management will be followed by Cardiology clinic.

## 2022-10-02 ENCOUNTER — Other Ambulatory Visit (HOSPITAL_COMMUNITY): Payer: Self-pay

## 2022-10-03 ENCOUNTER — Encounter: Payer: Self-pay | Admitting: Internal Medicine

## 2022-10-03 ENCOUNTER — Ambulatory Visit: Payer: Commercial Managed Care - PPO | Attending: Internal Medicine | Admitting: Internal Medicine

## 2022-10-03 VITALS — BP 110/74 | HR 82 | Ht 65.0 in | Wt 186.4 lb

## 2022-10-03 DIAGNOSIS — Z7901 Long term (current) use of anticoagulants: Secondary | ICD-10-CM

## 2022-10-03 DIAGNOSIS — I739 Peripheral vascular disease, unspecified: Secondary | ICD-10-CM | POA: Diagnosis not present

## 2022-10-03 DIAGNOSIS — I824Y9 Acute embolism and thrombosis of unspecified deep veins of unspecified proximal lower extremity: Secondary | ICD-10-CM

## 2022-10-03 NOTE — Progress Notes (Signed)
Cardiology Office Note  Date: 10/03/2022   ID: Ruth Robertson, DOB Apr 17, 1965, MRN [id number]  PCP:  Babs Sciara, MD  Cardiologist:  None Electrophysiologist:  None   History of Present Illness: Ruth Robertson is a 57 y.o. female known to have acute PE and DVT in 7/24 on Coumadin was referred to cardiology clinic for anticoagulation monitoring.   Diagnosed with acute PE and DVT in 7/24, initially was on Eliquis and switched to Coumadin due to size of the clot burden.  No symptoms.  Repeat ultrasound venous Doppler in 8/24 showed resolution of clots in our gastrocnemius and soleal veins.  Duration of anticoagulation is determined by hematology.  Hypercoagulable workup is negative.  Past Medical History:  Diagnosis Date   Anxiety    Asthma    ONLY WITH BRONCHITIS   GAD (generalized anxiety disorder)    GERD (gastroesophageal reflux disease)    Microscopic hematuria 12/03/2017   Negative cystoscopy negative CAT scan   Pneumonia    PONV (postoperative nausea and vomiting)     Past Surgical History:  Procedure Laterality Date   AGILE CAPSULE N/A 07/09/2016   Procedure: AGILE CAPSULE;  Surgeon: West Bali, MD;  [facility];  Service: Endoscopy;  Laterality: N/A;  7:30am, pt to arrive at 7:00am   BIOPSY  09/19/2015   Procedure: BIOPSY;  Surgeon: West Bali, MD;  [facility];  Service: Endoscopy;;  random colon bx's   COLONOSCOPY WITH PROPOFOL N/A 09/19/2015   Dr. Darrick Penna: five 2-3 mm polyps in rectum (hyperplastic) and descending colon, diverticulosis in sigmoid colon and transverse colon. Query post-infectious IBS. Next colonoscopy in 10 years    CYST EXCISION N/A 01/20/2019   Procedure: EXCISION OF RIGHT GROIN SEBACEOUS CYST;  Surgeon: Harriette Bouillon, MD;  Location: [facility];  Service: General;  Laterality: N/A;   GIVENS CAPSULE STUDY N/A 07/30/2016   Procedure: GIVENS CAPSULE STUDY;  Surgeon: West Bali,  MD;  [facility];  Service: Endoscopy;  Laterality: N/A;   HYSTEROSCOPY  2006   POLYPECTOMY  09/19/2015   Procedure: POLYPECTOMY;  Surgeon: West Bali, MD;  [facility];  Service: Endoscopy;;  descending colon polyps x3 cold bx, rectal polyp x2   SALPINGOOPHORECTOMY Bilateral 08/18/2012   Procedure: SALPINGO OOPHORECTOMY;  Surgeon: Dara Lords, MD;  Location: [facility];  Service: Gynecology;  Laterality: Bilateral;   VAGINAL HYSTERECTOMY N/A 08/18/2012   Procedure: HYSTERECTOMY VAGINAL;  Surgeon: Dara Lords, MD;  Location: [facility];  Service: Gynecology;  Laterality: N/A;    Current Outpatient Medications  Medication Sig Dispense Refill   acetaminophen (TYLENOL) 500 MG tablet Take 500 mg by mouth every 6 (six) hours as needed for mild pain or moderate pain.     enoxaparin (LOVENOX) 80 MG/0.8ML injection Inject 0.8 mLs (80 mg total) into the skin every 12 (twelve) hours for 5 days (Patient not taking: Reported on 09/26/2022) 8 mL 0   enoxaparin (LOVENOX) 80 MG/0.8ML injection Inject 0.8 mLs (80 mg total) into the skin every 12 (twelve) hours for 10 days. (Patient not taking: Reported on 09/26/2022) 16 mL 1   ondansetron (ZOFRAN) 8 MG tablet Take 1 tablet (8 mg total) by mouth every 8 (eight) hours as needed for nausea. (Patient not taking: Reported on 08/16/2022) 15 tablet 2   sulfaSALAzine (AZULFIDINE) 500 MG EC tablet Take 2 tablets (1,000 mg total) by mouth 2 (two) times daily with food 120  tablet 1   warfarin (COUMADIN) 5 MG tablet Take 1 tablet (5 mg total) by mouth daily. 30 tablet 1   No current facility-administered medications for this visit.   Allergies:  Patient has no known allergies.   Social History: The patient  reports that she has been smoking cigarettes. She has a 25 pack-year smoking history. She has never used smokeless tobacco. She reports that she does not currently use alcohol. She reports that she does not use drugs.   Family History:  The patient's family history includes Breast cancer in her maternal aunt; Colon cancer in her maternal aunt; Diabetes in her paternal grandfather; Heart attack in her mother; Heart failure in her father; Hypertension in her maternal grandmother; Stroke in her father.   ROS:  Please see the history of present illness. Otherwise, complete review of systems is positive for none  All other systems are reviewed and negative.   Physical Exam: VS:  LMP 06/12/2012 , BMI There is no height or weight on file to calculate BMI.  Wt Readings from Last 3 Encounters:  09/23/22 182 lb (82.6 kg)  08/16/22 180 lb 4.8 oz (81.8 kg)  08/12/22 182 lb (82.6 kg)    General: Patient appears comfortable at rest. HEENT: Conjunctiva and lids normal, oropharynx clear with moist mucosa. Neck: Supple, no elevated JVP or carotid bruits, no thyromegaly. Lungs: Clear to auscultation, nonlabored breathing at rest. Cardiac: Regular rate and rhythm, no S3 or significant systolic murmur, no pericardial rub. Abdomen: Soft, nontender, no hepatomegaly, bowel sounds present, no guarding or rebound. Extremities: No pitting edema, distal pulses 2+. Skin: Warm and dry. Musculoskeletal: No kyphosis. Neuropsychiatric: Alert and oriented x3, affect grossly appropriate.  Recent Labwork: 09/24/2022: ALT 23; AST 16; BUN 17; Creatinine, Ser 0.90; Hemoglobin 13.0; Platelets 233; Potassium 3.6; Sodium 140     Component Value Date/Time   CHOL 162 02/18/2020 1628   TRIG 103 02/18/2020 1628   HDL 42 (L) 02/18/2020 1628   CHOLHDL 3.9 02/18/2020 1628   VLDL 24 11/24/2014 1555   LDLCALC 100 (H) 02/18/2020 1628     Assessment and Plan:  Acute PE in 08/2022 Acute RLE DVT in 08/2022   -Patient referred to cardiology for anticoagulation monitoring with INR goal between 2 and 3.  Continue Coumadin 5 mg and 7.5 mg every other day. Will place referral to Coumadin clinic in [place].  Duration of anticoagulation is determined by hematology. Repeat  ultrasound right lower extremity in 09/2022 showed resolution of DVT in the R gastrocnemius and soleal veins.      Medication Adjustments/Labs and Tests Ordered: Current medicines are reviewed at length with the patient today.  Concerns regarding medicines are outlined above.    Disposition:  Follow up prn  Signed Luca Burston Verne Spurr, MD, 10/03/2022 2:42 PM    [facility] at [facility] [address] [place], [place], [place] [address]

## 2022-10-03 NOTE — Patient Instructions (Addendum)
Medication Instructions:  Your physician recommends that you continue on your current medications as directed. Please refer to the Current Medication list given to you today.   Labwork: None  Testing/Procedures: None  Follow-Up: Your physician recommends that you schedule a follow-up appointment in: As needed  Any Other Special Instructions Will Be Listed Below (If Applicable). Referral to Coumadin Clinic  If you need a refill on your cardiac medications before your next appointment, please call your pharmacy.

## 2022-10-03 NOTE — Addendum Note (Signed)
Addended by: Carmelina Paddock on: 10/03/2022 04:00 PM   Modules accepted: Orders

## 2022-10-09 ENCOUNTER — Other Ambulatory Visit (HOSPITAL_COMMUNITY): Payer: Self-pay

## 2022-10-09 ENCOUNTER — Ambulatory Visit: Payer: Commercial Managed Care - PPO | Attending: Cardiology | Admitting: *Deleted

## 2022-10-09 DIAGNOSIS — I82451 Acute embolism and thrombosis of right peroneal vein: Secondary | ICD-10-CM | POA: Diagnosis not present

## 2022-10-09 DIAGNOSIS — Z5181 Encounter for therapeutic drug level monitoring: Secondary | ICD-10-CM

## 2022-10-09 DIAGNOSIS — I2699 Other pulmonary embolism without acute cor pulmonale: Secondary | ICD-10-CM

## 2022-10-09 LAB — POCT INR: INR: 2.5 (ref 2.0–3.0)

## 2022-10-09 MED ORDER — WARFARIN SODIUM 7.5 MG PO TABS
ORAL_TABLET | ORAL | 3 refills | Status: DC
  Filled 2022-10-09: qty 30, 30d supply, fill #0
  Filled 2022-11-08: qty 30, 60d supply, fill #1
  Filled 2023-01-09: qty 30, 60d supply, fill #2

## 2022-10-09 NOTE — Patient Instructions (Signed)
Continue warfarin 7.5mg  alternating 5mg  daily Recheck in 7-10 days

## 2022-10-11 ENCOUNTER — Other Ambulatory Visit (HOSPITAL_COMMUNITY): Payer: Self-pay

## 2022-10-17 ENCOUNTER — Ambulatory Visit: Payer: Commercial Managed Care - PPO | Attending: Internal Medicine | Admitting: *Deleted

## 2022-10-17 DIAGNOSIS — I82451 Acute embolism and thrombosis of right peroneal vein: Secondary | ICD-10-CM

## 2022-10-17 DIAGNOSIS — I2699 Other pulmonary embolism without acute cor pulmonale: Secondary | ICD-10-CM | POA: Diagnosis not present

## 2022-10-17 DIAGNOSIS — Z5181 Encounter for therapeutic drug level monitoring: Secondary | ICD-10-CM

## 2022-10-17 LAB — POCT INR: INR: 3.7 — AB (ref 2.0–3.0)

## 2022-10-17 NOTE — Patient Instructions (Signed)
Hold warfarin tonight then decrease dose to 5mg  daily except 7.5mg  on Mondays and Thursdays Recheck in 2 wks

## 2022-10-29 ENCOUNTER — Ambulatory Visit: Payer: Commercial Managed Care - PPO | Attending: Internal Medicine | Admitting: *Deleted

## 2022-10-29 DIAGNOSIS — Z5181 Encounter for therapeutic drug level monitoring: Secondary | ICD-10-CM

## 2022-10-29 DIAGNOSIS — I2699 Other pulmonary embolism without acute cor pulmonale: Secondary | ICD-10-CM

## 2022-10-29 DIAGNOSIS — I82451 Acute embolism and thrombosis of right peroneal vein: Secondary | ICD-10-CM | POA: Diagnosis not present

## 2022-10-29 LAB — POCT INR: INR: 2.7 (ref 2.0–3.0)

## 2022-10-29 NOTE — Patient Instructions (Signed)
Continue warfarin 5mg  daily except 7.5mg  on Mondays and Thursdays Recheck in 2 wks

## 2022-11-08 ENCOUNTER — Other Ambulatory Visit: Payer: Self-pay

## 2022-11-08 ENCOUNTER — Other Ambulatory Visit (HOSPITAL_COMMUNITY): Payer: Self-pay

## 2022-11-08 ENCOUNTER — Other Ambulatory Visit: Payer: Self-pay | Admitting: Hematology

## 2022-11-11 ENCOUNTER — Other Ambulatory Visit (HOSPITAL_COMMUNITY): Payer: Self-pay

## 2022-11-11 ENCOUNTER — Other Ambulatory Visit: Payer: Self-pay

## 2022-11-11 MED ORDER — WARFARIN SODIUM 5 MG PO TABS
5.0000 mg | ORAL_TABLET | Freq: Every day | ORAL | 1 refills | Status: DC
  Filled 2022-11-11: qty 30, 30d supply, fill #0
  Filled 2023-01-09: qty 30, 30d supply, fill #1

## 2022-11-12 ENCOUNTER — Ambulatory Visit: Payer: Commercial Managed Care - PPO | Attending: Internal Medicine | Admitting: *Deleted

## 2022-11-12 DIAGNOSIS — Z5181 Encounter for therapeutic drug level monitoring: Secondary | ICD-10-CM | POA: Diagnosis not present

## 2022-11-12 DIAGNOSIS — I2699 Other pulmonary embolism without acute cor pulmonale: Secondary | ICD-10-CM | POA: Diagnosis not present

## 2022-11-12 DIAGNOSIS — I82451 Acute embolism and thrombosis of right peroneal vein: Secondary | ICD-10-CM

## 2022-11-12 LAB — POCT INR: INR: 3 (ref 2.0–3.0)

## 2022-11-12 NOTE — Patient Instructions (Signed)
Continue warfarin 5mg  daily except 7.5mg  on Mondays and Thursdays Recheck in 3 wks

## 2022-11-13 ENCOUNTER — Other Ambulatory Visit (HOSPITAL_COMMUNITY): Payer: Self-pay

## 2022-11-19 ENCOUNTER — Encounter: Payer: Self-pay | Admitting: Family Medicine

## 2022-11-22 ENCOUNTER — Inpatient Hospital Stay (HOSPITAL_BASED_OUTPATIENT_CLINIC_OR_DEPARTMENT_OTHER): Payer: Commercial Managed Care - PPO | Admitting: Oncology

## 2022-11-22 ENCOUNTER — Ambulatory Visit (INDEPENDENT_AMBULATORY_CARE_PROVIDER_SITE_OTHER): Payer: Self-pay | Admitting: Internal Medicine

## 2022-11-22 ENCOUNTER — Telehealth: Payer: Self-pay | Admitting: *Deleted

## 2022-11-22 ENCOUNTER — Inpatient Hospital Stay: Payer: Commercial Managed Care - PPO | Attending: Oncology

## 2022-11-22 ENCOUNTER — Other Ambulatory Visit: Payer: Self-pay

## 2022-11-22 VITALS — BP 116/87 | HR 62 | Temp 97.7°F | Resp 16 | Wt 187.0 lb

## 2022-11-22 DIAGNOSIS — Z803 Family history of malignant neoplasm of breast: Secondary | ICD-10-CM | POA: Diagnosis not present

## 2022-11-22 DIAGNOSIS — Z86718 Personal history of other venous thrombosis and embolism: Secondary | ICD-10-CM | POA: Diagnosis not present

## 2022-11-22 DIAGNOSIS — K219 Gastro-esophageal reflux disease without esophagitis: Secondary | ICD-10-CM | POA: Diagnosis not present

## 2022-11-22 DIAGNOSIS — M545 Low back pain, unspecified: Secondary | ICD-10-CM | POA: Insufficient documentation

## 2022-11-22 DIAGNOSIS — Z86711 Personal history of pulmonary embolism: Secondary | ICD-10-CM | POA: Diagnosis not present

## 2022-11-22 DIAGNOSIS — Z7901 Long term (current) use of anticoagulants: Secondary | ICD-10-CM | POA: Diagnosis not present

## 2022-11-22 DIAGNOSIS — I82451 Acute embolism and thrombosis of right peroneal vein: Secondary | ICD-10-CM

## 2022-11-22 DIAGNOSIS — F17210 Nicotine dependence, cigarettes, uncomplicated: Secondary | ICD-10-CM | POA: Diagnosis not present

## 2022-11-22 DIAGNOSIS — Z5181 Encounter for therapeutic drug level monitoring: Secondary | ICD-10-CM

## 2022-11-22 DIAGNOSIS — Z800 Family history of malignant neoplasm of digestive organs: Secondary | ICD-10-CM | POA: Insufficient documentation

## 2022-11-22 LAB — PROTIME-INR
INR: 1.9 — ABNORMAL HIGH (ref 0.8–1.2)
Prothrombin Time: 22.4 s — ABNORMAL HIGH (ref 11.4–15.2)

## 2022-11-22 NOTE — Telephone Encounter (Signed)
Please see anti-coag encounter from 11/22/2022 for further documentation.

## 2022-11-22 NOTE — Telephone Encounter (Signed)
Patient is at the cancer center and they told her to call because her INR is a 1.9

## 2022-11-22 NOTE — Progress Notes (Signed)
Lab order entered.

## 2022-11-22 NOTE — Progress Notes (Signed)
[facility] [address] [place] [address]   Clinic Day:  11/22/22   Referring physician: Babs Sciara, MD  Patient Care Team: Babs Sciara, MD as PCP - General (Family Medicine) Mallipeddi, Orion Modest, MD as PCP - Cardiology (Cardiology) Doreatha Massed, MD as Medical Oncologist (Hematology)   ASSESSMENT & PLAN:   Assessment:  1.  Weekly provoked/unprovoked pulmonary embolism: - [place] from the beach (4 hours with 1 stop) and noticed right knee pain and evaluated in the ER on 08/03/2022 and 72,[id number]. - Doppler (08/07/2022): Acute DVT involving right peroneal vein, right soleal vein, right gastrocnemius veins. - Eliquis started on 08/07/2022 with loading dose. - Workup with pleuritic pain in the right shoulder/upper back on 08/12/2022. - CT angiogram (08/11/2020): Positive for acute PE with CT evidence of right heart strain consistent with at least submassive PE.  Patchy density in the superior segment of the right lower lobe consistent with infiltrate/infarction. - No history of miscarriages. - Lupus anticoagulant, anticardiolipin and antibeta 2 glycoprotein antibodies negative. - It is not entirely clear if she is resistant to Eliquis.  However given the size of the embolus, recommend switching to anticoagulant to a different class. - Coumadin started on 08/12/2022  2.  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 - No family history of lupus anticoagulant.  Maternal aunt had breast cancer.  Another maternal aunt had colon cancer.  Plan:  1.  Weekly provoked/unprovoked DVT and PE: - Reviewed results of lupus anticoagulant which was negative.  Anticardiolipin antibodies were negative.  Antibeta-2 glycoprotein 1 antibodies were negative. - Factor V Leiden and prothrombin gene mutation testing were negative. - At this time I am favoring long-term anticoagulation. -Continue coumadin dosing based on INR with  Coumadin clinic in [place].  -Currently taking 5 mg daily except for 7.5 mg 2 days of the week.  Most recent INR from today was 1.9.   -She is reaching out to Coumadin clinic today for guidance as her INR is slightly low.  She has follow-up with them on 12/02/2022.   -RTC in 3 months with labs same day and see provider.    No orders of the defined types were placed in this encounter.  I spent 25 minutes dedicated to the care of this patient (face-to-face and non-face-to-face) on the date of the encounter to include what is described in the assessment and plan.   Mauro Kaufmann, NP   10/18/20241:28 PM  CHIEF COMPLAINT/PURPOSE OF CONSULT:   Diagnosis: DVT  Current Therapy: Warfarin  HISTORY OF PRESENT ILLNESS:   Ruth Robertson is a 57 y.o. female presenting to clinic today for evaluation of right lower extremity DVT. She was last seen on clinic 08/16/22.   Having some right elbow and lower back pain since she was seen last. Followed by [facility] coumadin clinic for biweekly INR checks. She has been doing well on Coumadin. No bright red blood per rectum, melena or hematochezia. Feels like she can still feel the blood clot in the back on her right calf although states its improving. States her medical bills are piling up and she is having to pay $50.00 each time she has her labs drawn with the Coumadin clinic. She is hoping to eventually come off of Coumadin and be able to switch to Eliquis.   Today, she states that she is doing well overall. Her appetite level is at 75%. Her energy level is at  75%.    PAST MEDICAL HISTORY:   Past Medical History: Past Medical History:  Diagnosis Date   Anxiety    Asthma    ONLY WITH BRONCHITIS   GAD (generalized anxiety disorder)    GERD (gastroesophageal reflux disease)    Microscopic hematuria 12/03/2017   Negative cystoscopy negative CAT scan   Pneumonia    PONV (postoperative nausea and vomiting)     Surgical History: Past Surgical History:  Procedure  Laterality Date   AGILE CAPSULE N/A 07/09/2016   Procedure: AGILE CAPSULE;  Surgeon: West Bali, MD;  [facility];  Service: Endoscopy;  Laterality: N/A;  7:30am, pt to arrive at 7:00am   BIOPSY  09/19/2015   Procedure: BIOPSY;  Surgeon: West Bali, MD;  [facility];  Service: Endoscopy;;  random colon bx's   COLONOSCOPY WITH PROPOFOL N/A 09/19/2015   Dr. Darrick Penna: five 2-3 mm polyps in rectum (hyperplastic) and descending colon, diverticulosis in sigmoid colon and transverse colon. Query post-infectious IBS. Next colonoscopy in 10 years    CYST EXCISION N/A 01/20/2019   Procedure: EXCISION OF RIGHT GROIN SEBACEOUS CYST;  Surgeon: Harriette Bouillon, MD;  Location: [facility];  Service: General;  Laterality: N/A;   GIVENS CAPSULE STUDY N/A 07/30/2016   Procedure: GIVENS CAPSULE STUDY;  Surgeon: West Bali, MD;  [facility];  Service: Endoscopy;  Laterality: N/A;   HYSTEROSCOPY  2006   POLYPECTOMY  09/19/2015   Procedure: POLYPECTOMY;  Surgeon: West Bali, MD;  [facility];  Service: Endoscopy;;  descending colon polyps x3 cold bx, rectal polyp x2   SALPINGOOPHORECTOMY Bilateral 08/18/2012   Procedure: SALPINGO OOPHORECTOMY;  Surgeon: Dara Lords, MD;  Location: [facility];  Service: Gynecology;  Laterality: Bilateral;   VAGINAL HYSTERECTOMY N/A 08/18/2012   Procedure: HYSTERECTOMY VAGINAL;  Surgeon: Dara Lords, MD;  Location: [facility];  Service: Gynecology;  Laterality: N/A;    Social History: Social History   Socioeconomic History   Marital status: Divorced    Spouse name: Not on file   Number of children: Not on file   Years of education: Not on file   Highest education level: Not on file  Occupational History   Not on file  Tobacco Use   Smoking status: Every Day    Current packs/day: 1.00    Average packs/day: 1 pack/day for 25.0 years (25.0 ttl pk-yrs)    Types: Cigarettes   Smokeless tobacco: Never   Vaping Use   Vaping status: Never Used  Substance and Sexual Activity   Alcohol use: Not Currently    Alcohol/week: 0.0 standard drinks of alcohol    Comment: rare   Drug use: No   Sexual activity: Not Currently    Partners: Male    Comment: HYST-1st intercourse 79 yo-5 partners  Other Topics Concern   Not on file  Social History Narrative   Not on file   Social Determinants of Health   Financial Resource Strain: Not on file  Food Insecurity: No Food Insecurity (08/12/2022)   Hunger Vital Sign    Worried About Running Out of Food in the Last Year: Never true    Ran Out of Food in the Last Year: Never true  Transportation Needs: No Transportation Needs (08/12/2022)   PRAPARE - Administrator, Civil Service (Medical): No    Lack of Transportation (Non-Medical): No  Physical Activity: Not on file  Stress: Not on file  Social Connections: Not on file  Intimate Partner Violence: Not At Risk (08/12/2022)   Humiliation, Afraid, Rape, and Kick questionnaire    Fear of Current or Ex-Partner: No    Emotionally Abused: No    Physically Abused: No    Sexually Abused: No    Family History: Family History  Problem Relation Age of Onset   Heart attack Mother    Heart failure Father    Stroke Father    Hypertension Maternal Grandmother    Diabetes Paternal Grandfather    Breast cancer Maternal Aunt        50's   Colon cancer Maternal Aunt        late 69s or 70s   Inflammatory bowel disease Neg Hx     Current Medications:  Current Outpatient Medications:    sulfaSALAzine (AZULFIDINE) 500 MG EC tablet, Take 2 tablets (1,000 mg total) by mouth 2 (two) times daily with food, Disp: 120 tablet, Rfl: 1   warfarin (COUMADIN) 5 MG tablet, Take 1 tablet (5 mg total) by mouth daily., Disp: 30 tablet, Rfl: 1   warfarin (COUMADIN) 7.5 MG tablet, Take 7.5mg  alternating with 5mg  daily or as directed by coumadin clinic, Disp: 30 tablet, Rfl: 3   Allergies: No Known  Allergies  REVIEW OF SYSTEMS:   Review of Systems  Respiratory:  Positive for cough and shortness of breath.   Gastrointestinal:  Positive for diarrhea.  Musculoskeletal:  Positive for arthralgias (To right elbow) and back pain.  Neurological:  Positive for headaches and numbness.     VITALS:   Blood pressure 116/87, pulse 62, temperature 97.7 F (36.5 C), temperature source Oral, resp. rate 16, weight 187 lb (84.8 kg), last menstrual period 06/12/2012, SpO2 100%.  Wt Readings from Last 3 Encounters:  11/22/22 187 lb (84.8 kg)  10/03/22 186 lb 6.4 oz (84.6 kg)  09/23/22 182 lb (82.6 kg)    Body mass index is 31.12 kg/m.   PHYSICAL EXAM:   Physical Exam Vitals reviewed.  Constitutional:      Appearance: Normal appearance.  Cardiovascular:     Rate and Rhythm: Normal rate and regular rhythm.  Pulmonary:     Effort: Pulmonary effort is normal.     Breath sounds: Normal breath sounds.  Abdominal:     General: Bowel sounds are normal.     Palpations: Abdomen is soft.  Musculoskeletal:        General: No swelling. Normal range of motion.  Neurological:     Mental Status: She is alert and oriented to person, place, and time. Mental status is at baseline.     LABS:      Latest Ref Rng & Units 09/24/2022    1:23 AM 08/12/2022   12:01 PM 08/05/2022   10:29 PM  CBC  WBC 4.0 - 10.5 K/uL 6.4  11.2  10.3   Hemoglobin 12.0 - 15.0 g/dL 47.8  29.5  62.1   Hematocrit 36.0 - 46.0 % 40.5  47.4  45.0   Platelets 150 - 400 K/uL 233  307  221       Latest Ref Rng & Units 09/24/2022    1:23 AM 08/13/2022    5:18 AM 08/12/2022   12:01 PM  CMP  Glucose 70 - 99 mg/dL 308  657  97   BUN 6 - 20 mg/dL 17  16  19    Creatinine 0.44 - 1.00 mg/dL 8.46  9.62  9.52   Sodium 135 - 145 mmol/L 140  137  138   Potassium 3.5 - 5.1 mmol/L 3.6  3.9  3.9   Chloride 98 - 111 mmol/L 106  104  103   CO2 22 - 32 mmol/L 24  23  24    Calcium 8.9 - 10.3 mg/dL 8.5  8.9  9.3   Total Protein 6.5 - 8.1 g/dL  6.4     Total Bilirubin 0.3 - 1.2 mg/dL 0.6     Alkaline Phos 38 - 126 U/L 54     AST 15 - 41 U/L 16     ALT 0 - 44 U/L 23        No results found for: "CEA1", "CEA" / No results found for: "CEA1", "CEA" No results found for: "PSA1" No results found for: "ZOX096" No results found for: "CAN125"  No results found for: "TOTALPROTELP", "ALBUMINELP", "A1GS", "A2GS", "BETS", "BETA2SER", "GAMS", "MSPIKE", "SPEI" Lab Results  Component Value Date   TIBC 338 02/28/2021   FERRITIN 42 02/28/2021   FERRITIN 30 12/18/2013   IRONPCTSAT 33 (H) 02/28/2021   No results found for: "LDH"   STUDIES:   No results found.

## 2022-11-22 NOTE — Patient Instructions (Addendum)
Description   Called and spoke to pt and instructed her to take warfarin 7.5mg  today and then continue warfarin 5mg  daily except 7.5mg  on Mondays and Thursdays. Be consistent with green intake.  Appointment already scheduled for 10/29

## 2022-12-03 ENCOUNTER — Ambulatory Visit: Payer: Commercial Managed Care - PPO | Attending: Internal Medicine | Admitting: *Deleted

## 2022-12-03 DIAGNOSIS — Z5181 Encounter for therapeutic drug level monitoring: Secondary | ICD-10-CM | POA: Diagnosis not present

## 2022-12-03 DIAGNOSIS — I82451 Acute embolism and thrombosis of right peroneal vein: Secondary | ICD-10-CM | POA: Diagnosis not present

## 2022-12-03 DIAGNOSIS — I2699 Other pulmonary embolism without acute cor pulmonale: Secondary | ICD-10-CM

## 2022-12-03 LAB — POCT INR: INR: 3.2 — AB (ref 2.0–3.0)

## 2022-12-03 NOTE — Patient Instructions (Signed)
Take warfarin 1/2 tablet tonight then resume warfarin 5mg  daily except 7.5mg  on Mondays and Thursdays. Be consistent with green intake.  Recheck in 3 wks

## 2022-12-17 ENCOUNTER — Ambulatory Visit: Payer: Commercial Managed Care - PPO | Admitting: Family Medicine

## 2022-12-17 VITALS — BP 125/85 | HR 86 | Temp 98.8°F | Ht 65.0 in | Wt 182.4 lb

## 2022-12-17 DIAGNOSIS — U071 COVID-19: Secondary | ICD-10-CM | POA: Diagnosis not present

## 2022-12-17 MED ORDER — TRIAMCINOLONE ACETONIDE 55 MCG/ACT NA AERO: 2.0000 | INHALATION_SPRAY | Freq: Every day | NASAL | Status: DC

## 2022-12-17 NOTE — Assessment & Plan Note (Addendum)
Suspected COVID-19 given exposure.  Advised to take home test.  There is an interaction between Paxlovid and warfarin.  Therefore, we are proceeding with supportive care/symptomatic treatment.  Advised over-the-counter antihistamine and nasal steroid.

## 2022-12-17 NOTE — Progress Notes (Signed)
Subjective:  Patient ID: Ruth Robertson, female    DOB: 1965/12/26  Age: 57 y.o. MRN: [id number]  CC: Respiratory symptoms   HPI:  57 year old female presents with respiratory symptoms.  Started on Sunday.  She has had sore throat, headache, congestion.  I saw her grandchild yesterday.  Grandchild as tested positive for COVID.  Other family members have COVID as well.  She has been exposed.  No fever.  No relieving factors.  No other complaints at this time.  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   Bradycardia 03/04/2021   Endometriosis 02/23/2021   History of cervical dysplasia 02/23/2021   Asthma 12/29/2020   Microscopic hematuria 12/03/2017   Lesion of liver 07/17/2017   Tobacco abuse 06/19/2017   Depression with anxiety 03/26/2011    Social Hx   Social History   Socioeconomic History   Marital status: Divorced    Spouse name: Not on file   Number of children: Not on file   Years of education: Not on file   Highest education level: Not on file  Occupational History   Not on file  Tobacco Use   Smoking status: Every Day    Current packs/day: 1.00    Average packs/day: 1 pack/day for 25.0 years (25.0 ttl pk-yrs)    Types: Cigarettes   Smokeless tobacco: Never  Vaping Use   Vaping status: Never Used  Substance and Sexual Activity   Alcohol use: Not Currently    Alcohol/week: 0.0 standard drinks of alcohol    Comment: rare   Drug use: No   Sexual activity: Not Currently    Partners: Male    Comment: HYST-1st intercourse 17 yo-5 partners  Other Topics Concern   Not on file  Social History Narrative   Not on file   Social Determinants  of Health   Financial Resource Strain: Not on file  Food Insecurity: No Food Insecurity (08/12/2022)   Hunger Vital Sign    Worried About Running Out of Food in the Last Year: Never true    Ran Out of Food in the Last Year: Never true  Transportation Needs: No Transportation Needs (08/12/2022)   PRAPARE - Transportation    Lack of Transportation (Medical): No    Lack of Transportation (Non-Medical): No  Physical Activity: Not on file  Stress: Not on file  Social Connections: Not on file    Review of Systems Per HPI  Objective:  BP 125/85   Pulse 86   Temp 98.8 F (37.1 C)   Ht 5\' 5" (1.651 m)   Wt 182 lb 6.4 oz (82.7 kg)   LMP 06/12/2012   SpO2 97%   BMI 30.35 kg/m      11 /01/2023   11:27 AM 11/22/2022    1:25 PM 10/03/2022    2:54 PM  BP/Weight  Systolic BP 125 116 110  Diastolic BP 85 87 74  Wt. (Lbs) 182.4 187 186.4  BMI 30.35 kg/m2 31.12 kg/m2 31.02 kg/m2    Physical Exam Vitals and nursing note reviewed.  Constitutional:      General: She is not in acute distress.    Appearance: She is not ill-appearing.  HENT:     Head: Normocephalic  and atraumatic.  Cardiovascular:     Rate and Rhythm: Normal rate and regular rhythm.  Pulmonary:     Effort: Pulmonary effort is normal.     Breath sounds: Normal breath sounds. No wheezing, rhonchi or rales.  Neurological:     Mental Status: She is alert.     Lab Results  Component Value Date   WBC 6.4 09/24/2022   HGB 13.0 09/24/2022   HCT 40.5 09/24/2022   PLT 233 09/24/2022   GLUCOSE 155 (H) 09/24/2022   CHOL 162 02/18/2020   TRIG 103 02/18/2020   HDL 42 (L) 02/18/2020   LDLCALC 100 (H) 02/18/2020   ALT 23 09/24/2022   AST 16 09/24/2022   NA 140 09/24/2022   K 3.6 09/24/2022   CL 106 09/24/2022   CREATININE 0.90 09/24/2022   BUN 17 09/24/2022   CO2 24 09/24/2022   TSH 0.88 02/18/2020   INR 3.2 (A) 12/03/2022     Assessment & Plan:   Problem List Items Addressed This Visit       Other   COVID  - Primary    Suspected COVID-19 given exposure.  Advised to take home test.  There is an interaction between Paxlovid and warfarin.  Therefore, we are proceeding with supportive care/symptomatic treatment.  Advised over-the-counter antihistamine and nasal steroid.      Meds ordered this encounter  Medications   triamcinolone (NASACORT) 55 MCG/ACT AERO nasal inhaler    Sig: Place 2 sprays into the nose daily.   Follow-up:  Return if symptoms worsen or fail to improve.  Everlene Other DO [facility]

## 2022-12-18 ENCOUNTER — Other Ambulatory Visit: Payer: Self-pay | Admitting: Family Medicine

## 2022-12-18 ENCOUNTER — Telehealth: Payer: Self-pay | Admitting: *Deleted

## 2022-12-18 ENCOUNTER — Ambulatory Visit: Payer: Commercial Managed Care - PPO | Admitting: Family Medicine

## 2022-12-18 ENCOUNTER — Ambulatory Visit (HOSPITAL_COMMUNITY)
Admission: RE | Admit: 2022-12-18 | Discharge: 2022-12-18 | Disposition: A | Discharge status: Home / Self Care | Payer: Commercial Managed Care - PPO | Source: Ambulatory Visit | Attending: Family Medicine | Admitting: Family Medicine

## 2022-12-18 DIAGNOSIS — R0602 Shortness of breath: Secondary | ICD-10-CM

## 2022-12-18 DIAGNOSIS — R110 Nausea: Secondary | ICD-10-CM | POA: Diagnosis not present

## 2022-12-18 MED ORDER — ALBUTEROL SULFATE HFA 108 (90 BASE) MCG/ACT IN AERS: 2.0000 | INHALATION_SPRAY | Freq: Four times a day (QID) | RESPIRATORY_TRACT | 0 refills | Status: AC | PRN

## 2022-12-18 NOTE — Telephone Encounter (Signed)
Patient notified and verbalized understanding. 

## 2022-12-18 NOTE — Telephone Encounter (Signed)
Patient stated it hurt really bad in her back to breathe last night and almost went to ER but feeling a little better today as the day goes on but still not feeling well at all

## 2022-12-18 NOTE — Telephone Encounter (Signed)
Tommie Sams, DO     Albuterol sent. Xray ordered.

## 2022-12-18 NOTE — Telephone Encounter (Signed)
Everlene Other G, DO     Can we get more information about her "painful breathing". We discussed antivirals yesterday.  However, Paxlovid has an interaction with her warfarin.  Molnupiravir unlikely to be of significant benefit.  If she is concerned about her breathing, we can arrange a chest x-ray.

## 2022-12-18 NOTE — Telephone Encounter (Signed)
Source  Goldonna, Shaydee Cieslik "Ruth Robertson" (Patient)   Subject  Pennington-Walker, Theotis Burrow "Ruth Robertson" (Patient)   Topic  Clinical - Medical Advice    Communication  Reason for CRM: Patient saw Dr. Adriana Simas yesterday due to illness since Sunday. Took home Covid test last night, Positive, would like to know what can do for painful breathing.

## 2022-12-23 ENCOUNTER — Other Ambulatory Visit (HOSPITAL_COMMUNITY): Payer: Self-pay

## 2022-12-23 MED ORDER — SULFASALAZINE 500 MG PO TBEC
1000.0000 mg | DELAYED_RELEASE_TABLET | Freq: Two times a day (BID) | ORAL | 1 refills | Status: DC
Start: 2022-12-23 — End: 2023-03-03
  Filled 2022-12-23: qty 120, 30d supply, fill #0
  Filled 2023-02-14: qty 120, 30d supply, fill #1

## 2022-12-24 ENCOUNTER — Other Ambulatory Visit (HOSPITAL_COMMUNITY): Payer: Self-pay

## 2022-12-24 ENCOUNTER — Ambulatory Visit: Payer: Commercial Managed Care - PPO | Attending: Internal Medicine | Admitting: *Deleted

## 2022-12-24 DIAGNOSIS — I82451 Acute embolism and thrombosis of right peroneal vein: Secondary | ICD-10-CM

## 2022-12-24 DIAGNOSIS — I2699 Other pulmonary embolism without acute cor pulmonale: Secondary | ICD-10-CM

## 2022-12-24 DIAGNOSIS — Z5181 Encounter for therapeutic drug level monitoring: Secondary | ICD-10-CM | POA: Diagnosis not present

## 2022-12-24 LAB — POCT INR: INR: 3.4 — AB (ref 2.0–3.0)

## 2022-12-24 NOTE — Patient Instructions (Signed)
Hold warfarin tonight then decrease dose to 5mg  daily.  Be consistent with green intake.  Recheck in 3 wks

## 2023-01-09 ENCOUNTER — Other Ambulatory Visit (HOSPITAL_COMMUNITY): Payer: Self-pay

## 2023-01-13 ENCOUNTER — Ambulatory Visit: Payer: Commercial Managed Care - PPO | Attending: Internal Medicine | Admitting: *Deleted

## 2023-01-13 DIAGNOSIS — Z5181 Encounter for therapeutic drug level monitoring: Secondary | ICD-10-CM

## 2023-01-13 DIAGNOSIS — I82451 Acute embolism and thrombosis of right peroneal vein: Secondary | ICD-10-CM | POA: Diagnosis not present

## 2023-01-13 DIAGNOSIS — I2699 Other pulmonary embolism without acute cor pulmonale: Secondary | ICD-10-CM

## 2023-01-13 LAB — POCT INR: INR: 2.5 (ref 2.0–3.0)

## 2023-01-13 NOTE — Patient Instructions (Signed)
Continue warfarin 5mg  daily.  Be consistent with green intake.  Recheck in 4 wks

## 2023-02-13 ENCOUNTER — Ambulatory Visit: Payer: Commercial Managed Care - PPO | Admitting: Family Medicine

## 2023-02-13 ENCOUNTER — Ambulatory Visit: Payer: Commercial Managed Care - PPO | Attending: Internal Medicine | Admitting: *Deleted

## 2023-02-13 DIAGNOSIS — I2699 Other pulmonary embolism without acute cor pulmonale: Secondary | ICD-10-CM | POA: Diagnosis not present

## 2023-02-13 DIAGNOSIS — I82451 Acute embolism and thrombosis of right peroneal vein: Secondary | ICD-10-CM | POA: Diagnosis not present

## 2023-02-13 DIAGNOSIS — Z5181 Encounter for therapeutic drug level monitoring: Secondary | ICD-10-CM | POA: Diagnosis not present

## 2023-02-13 LAB — POCT INR: INR: 1.9 — AB (ref 2.0–3.0)

## 2023-02-13 NOTE — Patient Instructions (Signed)
 Take warfarin 1 1/2 tablets tonight then resume 1 tablet daily.  Be consistent with green intake.  Recheck in 4 wks

## 2023-02-14 ENCOUNTER — Telehealth: Payer: Self-pay | Admitting: Internal Medicine

## 2023-02-14 ENCOUNTER — Other Ambulatory Visit (HOSPITAL_COMMUNITY): Payer: Self-pay

## 2023-02-14 ENCOUNTER — Other Ambulatory Visit: Payer: Self-pay | Admitting: Hematology

## 2023-02-14 DIAGNOSIS — I82451 Acute embolism and thrombosis of right peroneal vein: Secondary | ICD-10-CM

## 2023-02-14 MED ORDER — WARFARIN SODIUM 5 MG PO TABS
5.0000 mg | ORAL_TABLET | Freq: Every day | ORAL | 0 refills | Status: DC
  Filled 2023-02-14: qty 90, fill #0
  Filled 2023-03-13 (×2): qty 90, 90d supply, fill #0

## 2023-02-14 MED ORDER — WARFARIN SODIUM 5 MG PO TABS
5.0000 mg | ORAL_TABLET | Freq: Every day | ORAL | 3 refills | Status: DC
  Filled 2023-02-14: qty 30, 30d supply, fill #0
  Filled 2023-06-03: qty 30, 30d supply, fill #1
  Filled 2023-07-09: qty 30, 30d supply, fill #2
  Filled 2023-08-11: qty 30, 30d supply, fill #3
  Filled 2023-09-16: qty 16, 16d supply, fill #4

## 2023-02-14 NOTE — Telephone Encounter (Signed)
 Prescription refill request received for warfarin Lov: 10/03/22 ([place])  Next INR check: 03/12/23 Warfarin tablet strength: 5mg   Appropriate dose. Refill sent.

## 2023-02-14 NOTE — Telephone Encounter (Signed)
*  STAT* If patient is at the pharmacy, call can be transferred to refill team.   1. Which medications need to be refilled? (please list name of each medication and dose if known) warfarin (COUMADIN ) 5 MG tablet    2. Would you like to learn more about the convenience, safety, & potential cost savings by using the [facility]?     3. Are you open to using the [facility] (Type [facility].  ).   4. Which pharmacy/location (including street and city if local pharmacy) is medication to be sent to? [place] [facility]    5. Do they need a 30 day or 90 day supply? 30 day   Patient only has pills left.

## 2023-02-26 ENCOUNTER — Ambulatory Visit: Payer: Commercial Managed Care - PPO | Admitting: Family Medicine

## 2023-02-26 VITALS — BP 118/76 | HR 76 | Temp 97.5°F | Ht 65.0 in | Wt 184.0 lb

## 2023-02-26 DIAGNOSIS — H9313 Tinnitus, bilateral: Secondary | ICD-10-CM

## 2023-02-26 NOTE — Progress Notes (Signed)
   Subjective:    Patient ID: Ruth Robertson, female    DOB: [birth year], 58 y.o.   MRN: [id number]  Discussed the use of AI scribe software for clinical note transcription with the patient, who gave verbal consent to proceed.  History of Present Illness   The patient initially sought consultation due to severe back pain, which was so intense that it affected her breathing and sleep. She believes the pain was triggered by lifting a heavy bag of trash. Over time, the pain has gradually improved, although she still experiences occasional stiffness and tightness in the back.  In addition to the back pain, the patient has been experiencing a clicking sound in both ears, primarily when lying down. This symptom is not associated with any pain or discomfort, and it does not seem to affect her hearing. However, she also reports occasional ringing in both ears.  The patient has a history of stomach issues and has been using Coca Cola to manage nausea. She is currently trying to stop this habit due to its unhealthy nature. She also expresses a desire to lose weight but is struggling with dietary restrictions due to her medication, Coumadin, which interacts with certain foods.  The patient has been trying to maintain a healthy lifestyle, including using an exercise machine at home. However, she finds it challenging to maintain a consistent routine. She also expresses frustration with her medication, wishing to switch back to Eliquis from Coumadin. She is scheduled to see a hematology specialist monthly.         Review of Systems     Objective:    Physical Exam   HEENT: External auditory canals clear, tympanic membranes intact without erythema or effusion.   Lungs clear heart regular       Assessment & Plan:  Assessment and Plan    Back Pain Resolved. Likely due to muscle strain from heavy lifting. No current pain or discomfort. -No further intervention required at this  time.  Tinnitus Reports bilateral clicking and ringing in ears, more noticeable at night. No associated hearing loss or vertigo. -Consult with ENT for further evaluation and management.  Weight Management Expressed desire for weight loss assistance. Discussed limitations of current weight loss medications due to cost and insurance coverage. Patient attempting to reduce sugary drink intake. -Encouraged continued efforts to reduce sugary drink intake and increase physical activity. -Consider referral to a weight management program for further support.  Warfarin Management Difficulty maintaining consistent diet due to impact on INR levels. Expressed desire to switch back to Eliquis. -Discuss potential medication changes with Hematology at next appointment.  General Health Maintenance -Continue annual mammograms. -Colonoscopy not due until 2028. -Flu vaccination up to date.      Patient with clicking noise in her years notices it more so when she is laying down does not notice it when she is sitting up or moving around.  Patient also concerned about obesity we did discuss medications would be helpful but unfortunately not covered by her insurance  We did discuss the importance of portion control regular physical activity We have sent communication to ENT to see if they feel they can help her with this clicking noise

## 2023-03-03 ENCOUNTER — Telehealth: Payer: Self-pay | Admitting: Family Medicine

## 2023-03-03 ENCOUNTER — Other Ambulatory Visit (HOSPITAL_COMMUNITY): Payer: Self-pay

## 2023-03-03 DIAGNOSIS — M47819 Spondylosis without myelopathy or radiculopathy, site unspecified: Secondary | ICD-10-CM | POA: Diagnosis not present

## 2023-03-03 DIAGNOSIS — Z79899 Other long term (current) drug therapy: Secondary | ICD-10-CM | POA: Diagnosis not present

## 2023-03-03 MED ORDER — SULFASALAZINE 500 MG PO TBEC
1000.0000 mg | DELAYED_RELEASE_TABLET | Freq: Two times a day (BID) | ORAL | 0 refills | Status: DC
Start: 2023-03-03 — End: 2023-07-03
  Filled 2023-03-03 – 2023-03-13 (×2): qty 360, 90d supply, fill #0
  Filled 2023-03-14: qty 100, 25d supply, fill #0
  Filled 2023-03-14: qty 360, 90d supply, fill #0
  Filled 2023-04-03 – 2023-06-03 (×2): qty 100, 25d supply, fill #1

## 2023-03-03 NOTE — Telephone Encounter (Signed)
Patient was seen the other day and stated that she had clicking noise in her ear that she notices at bedtime  Please let the patient know that I did touch base with ENT regarding this issue  They stated this could be eustachian tube dysfunction versus a small noise associated with some of the inner ear stapes (which is a small bone within the ear)  They also stated that typically they would do a audiology evaluation for something like this as well Based on the findings there may or may not be anything that can be done about this  Nurses-please talk with patient-let her know that we can help set her up for evaluation with ENT in [place] if she is interested

## 2023-03-04 NOTE — Telephone Encounter (Signed)
Spoke with the patient and she states the ear noise is not causing her any problems and will hold off on the ENT referral for now

## 2023-03-12 ENCOUNTER — Ambulatory Visit: Payer: Commercial Managed Care - PPO | Attending: Internal Medicine | Admitting: *Deleted

## 2023-03-12 DIAGNOSIS — I82451 Acute embolism and thrombosis of right peroneal vein: Secondary | ICD-10-CM | POA: Diagnosis not present

## 2023-03-12 DIAGNOSIS — Z5181 Encounter for therapeutic drug level monitoring: Secondary | ICD-10-CM

## 2023-03-12 DIAGNOSIS — I2699 Other pulmonary embolism without acute cor pulmonale: Secondary | ICD-10-CM | POA: Diagnosis not present

## 2023-03-12 LAB — POCT INR: POC INR: 2.4

## 2023-03-12 NOTE — Patient Instructions (Signed)
 Description   Continue taking warfarin 1 tablet (5mg ) daily.  Be consistent with green intake.  Recheck in 5 wks

## 2023-03-13 ENCOUNTER — Other Ambulatory Visit (HOSPITAL_COMMUNITY): Payer: Self-pay

## 2023-03-14 ENCOUNTER — Other Ambulatory Visit (HOSPITAL_COMMUNITY): Payer: Self-pay

## 2023-03-17 ENCOUNTER — Other Ambulatory Visit (HOSPITAL_COMMUNITY): Payer: Self-pay

## 2023-03-20 ENCOUNTER — Other Ambulatory Visit: Payer: Self-pay | Admitting: Family Medicine

## 2023-03-20 DIAGNOSIS — Z1231 Encounter for screening mammogram for malignant neoplasm of breast: Secondary | ICD-10-CM

## 2023-03-24 ENCOUNTER — Other Ambulatory Visit (HOSPITAL_COMMUNITY)
Admission: RE | Admit: 2023-03-24 | Discharge: 2023-03-24 | Disposition: A | Discharge status: Home / Self Care | Payer: Commercial Managed Care - PPO | Source: Ambulatory Visit | Attending: Obstetrics and Gynecology | Admitting: Obstetrics and Gynecology

## 2023-03-24 ENCOUNTER — Encounter: Payer: Self-pay | Admitting: Obstetrics and Gynecology

## 2023-03-24 ENCOUNTER — Ambulatory Visit (INDEPENDENT_AMBULATORY_CARE_PROVIDER_SITE_OTHER): Payer: Commercial Managed Care - PPO | Admitting: Obstetrics and Gynecology

## 2023-03-24 VITALS — BP 122/88 | HR 95 | Ht 65.75 in | Wt 187.0 lb

## 2023-03-24 DIAGNOSIS — D229 Melanocytic nevi, unspecified: Secondary | ICD-10-CM

## 2023-03-24 DIAGNOSIS — Z01419 Encounter for gynecological examination (general) (routine) without abnormal findings: Secondary | ICD-10-CM

## 2023-03-24 DIAGNOSIS — N952 Postmenopausal atrophic vaginitis: Secondary | ICD-10-CM

## 2023-03-24 DIAGNOSIS — E2839 Other primary ovarian failure: Secondary | ICD-10-CM | POA: Diagnosis not present

## 2023-03-24 NOTE — Progress Notes (Addendum)
 58 y.o. y.o. female here for annual exam. Patient's last menstrual period was 06/12/2012.    female here for annual exam.  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 History of  clots. With PE on warfarin Works at [employer] 64 year old granddaughter.   Patient's last menstrual period was 06/12/2012.          Sexually active: No.  The current method of family planning is status post hysterectomy.    Exercising: Yes.    The patient has a physically strenuous job, but has no regular exercise apart from work.  Smoker:  yes less than a pack a day (little more than 1/2 a pack).  Dxa: to get baseline hypoestrogenism with risk factors  Health Maintenance: Pap: 1/20/23LSIL Hr HPV +; 11/28/16 WNL  10/21/13 WNL , last pap smear with ASCUS HPVDNA+ History of abnormal Pap: yes Colpo 03/19/21, 2 biopsies, both with VAIN I 04/26/22 h/o cryosurgery of her cervix ~29 years ago  MMG:  04/25/23 BMD:   09/04/22 -1.8 frax 7.7, 1.3% repeat in 2 years Colonoscopy: 03/02/21 1 polyp removed. TDaP:  08/27/18  Gardasil: na Body mass index is 30.41 kg/m.     02/26/2023   10:52 AM 12/17/2022   11:29 AM 08/09/2022    4:11 PM  Depression screen PHQ 2/9  Decreased Interest 0 0 0  Down, Depressed, Hopeless 0 0 0  PHQ - 2 Score 0 0 0  Altered sleeping 1 1 0  Tired, decreased energy 2 1 1   Change in appetite 0 1 0  Feeling bad or failure about yourself  0 0 0  Trouble concentrating 1 0 1  Moving slowly or fidgety/restless 0 0 1  Suicidal thoughts 0 0 0  PHQ-9 Score 4 3 3   Difficult doing work/chores Not difficult at all Not difficult at all Somewhat difficult    Blood pressure 122/88, pulse 95, height 5' 5.75" (1.67 m), weight 187 lb (84.8 kg), last menstrual period 06/12/2012, SpO2 98%.     Component Value Date/Time   DIAGPAP (A) 03/19/2022 1558    - Atypical squamous cells of undetermined significance (ASC-US)   DIAGPAP - Low grade squamous intraepithelial lesion (LSIL) (A) 02/23/2021 1529   HPVHIGH  Positive (A) 03/19/2022 1558   HPVHIGH Positive (A) 02/23/2021 1529   ADEQPAP Satisfactory for evaluation. 03/19/2022 1558   ADEQPAP Satisfactory for evaluation. 02/23/2021 1529    GYN HISTORY:    Component Value Date/Time   DIAGPAP (A) 03/19/2022 1558    - Atypical squamous cells of undetermined significance (ASC-US)   DIAGPAP - Low grade squamous intraepithelial lesion (LSIL) (A) 02/23/2021 1529   HPVHIGH Positive (A) 03/19/2022 1558   HPVHIGH Positive (A) 02/23/2021 1529   ADEQPAP Satisfactory for evaluation. 03/19/2022 1558   ADEQPAP Satisfactory for evaluation. 02/23/2021 1529    OB History  Gravida Para Term Preterm AB Living  1 1 1   1   SAB IAB Ectopic Multiple Live Births          # Outcome Date GA Lbr Len/2nd Weight Sex Type Anes PTL Lv  1 Term             Past Medical History:  Diagnosis Date   Anxiety    Asthma    ONLY WITH BRONCHITIS   DVT (deep venous thrombosis) (HCC)    GAD (generalized anxiety disorder)    GERD (gastroesophageal reflux disease)    Microscopic hematuria 12/03/2017   Negative cystoscopy negative CAT scan   Pneumonia  PONV (postoperative nausea and vomiting)     Past Surgical History:  Procedure Laterality Date   AGILE CAPSULE N/A 07/09/2016   Procedure: AGILE CAPSULE;  Surgeon: West Bali, MD;  [facility]: [facility];  Service: Endoscopy;  Laterality: N/A;  7:30am, pt to arrive at 7:00am   BIOPSY  09/19/2015   Procedure: BIOPSY;  Surgeon: West Bali, MD;  Location: [facility];  Service: Endoscopy;;  random colon bx's   COLONOSCOPY WITH PROPOFOL N/A 09/19/2015   Dr. Darrick Penna: five 2-3 mm polyps in rectum (hyperplastic) and descending colon, diverticulosis in sigmoid colon and transverse colon. Query post-infectious IBS. Next colonoscopy in 10 years    CYST EXCISION N/A 01/20/2019   Procedure: EXCISION OF RIGHT GROIN SEBACEOUS CYST;  Surgeon: Harriette Bouillon, MD;  Location: [facility];  Service: General;   Laterality: N/A;   GIVENS CAPSULE STUDY N/A 07/30/2016   Procedure: GIVENS CAPSULE STUDY;  Surgeon: West Bali, MD;  [facility]: [facility];  Service: Endoscopy;  Laterality: N/A;   HYSTEROSCOPY  2006   POLYPECTOMY  09/19/2015   Procedure: POLYPECTOMY;  Surgeon: West Bali, MD;  [facility]: [facility];  Service: Endoscopy;;  descending colon polyps x3 cold bx, rectal polyp x2   SALPINGOOPHORECTOMY Bilateral 08/18/2012   Procedure: SALPINGO OOPHORECTOMY;  Surgeon: Dara Lords, MD;  Location: [facility];  Service: Gynecology;  Laterality: Bilateral;   VAGINAL HYSTERECTOMY N/A 08/18/2012   Procedure: HYSTERECTOMY VAGINAL;  Surgeon: Dara Lords, MD;  Location: [facility];  Service: Gynecology;  Laterality: N/A;    Current Outpatient Medications on File Prior to Visit  Medication Sig Dispense Refill   albuterol (VENTOLIN HFA) 108 (90 Base) MCG/ACT inhaler Inhale 2 puffs into the lungs every 6 (six) hours as needed for wheezing or shortness of breath. (Patient not taking: Reported on 03/24/2023) 8 g 0   sulfaSALAzine (AZULFIDINE) 500 MG EC tablet Take 2 tablets (1,000 mg total) by mouth 2 (two) times daily. 360 tablet 0   triamcinolone (NASACORT) 55 MCG/ACT AERO nasal inhaler Place 2 sprays into the nose daily. (Patient not taking: Reported on 03/24/2023)     warfarin (COUMADIN) 5 MG tablet Take 1 tablet (5 mg total) by mouth daily. 34 tablet 3   No current facility-administered medications on file prior to visit.    Social History   Socioeconomic History   Marital status: Divorced    Spouse name: Not on file   Number of children: Not on file   Years of education: Not on file   Highest education level: Not on file  Occupational History   Not on file  Tobacco Use   Smoking status: Every Day    Current packs/day: 1.00    Average packs/day: 1 pack/day for 25.0 years (25.0 ttl pk-yrs)    Types: Cigarettes   Smokeless tobacco: Never  Vaping Use   Vaping status: Never Used   Substance and Sexual Activity   Alcohol use: Not Currently    Alcohol/week: 0.0 standard drinks of alcohol    Comment: rare   Drug use: No   Sexual activity: Not Currently    Partners: Male    Birth control/protection: Surgical    Comment: HYST-1st intercourse 17 yo-5 partners  Other Topics Concern   Not on file  Social History Narrative   Not on file   Social Drivers of Health   Financial Resource Strain: Not on file  Food Insecurity: No Food Insecurity (08/12/2022)   Hunger Vital  Sign    Worried About Programme researcher, broadcasting/film/video in the Last Year: Never true    Ran Out of Food in the Last Year: Never true  Transportation Needs: No Transportation Needs (08/12/2022)   PRAPARE - Administrator, Civil Service (Medical): No    Lack of Transportation (Non-Medical): No  Physical Activity: Not on file  Stress: Not on file  Social Connections: Not on file  Intimate Partner Violence: Not At Risk (08/12/2022)   Humiliation, Afraid, Rape, and Kick questionnaire    Fear of Current or Ex-Partner: No    Emotionally Abused: No    Physically Abused: No    Sexually Abused: No    Family History  Problem Relation Age of Onset   Heart attack Mother    Heart failure Father    Stroke Father    Hypertension Maternal Grandmother    Diabetes Paternal Grandfather    Breast cancer Maternal Aunt        50's   Colon cancer Maternal Aunt        late 70s or 70s   Inflammatory bowel disease Neg Hx      No Known Allergies    Patient's last menstrual period was Patient's last menstrual period was 06/12/2012.Marland Kitchen            Review of Systems Alls systems reviewed and are negative.     Physical Exam Constitutional:      Appearance: Normal appearance.  Genitourinary:     Vulva normal.     No lesions in the vagina.     Right Labia: No rash, lesions or skin changes.    Left Labia: No lesions, skin changes or rash.    Vaginal cuff intact.    No vaginal discharge or tenderness.     No  vaginal prolapse present.    Severe vaginal atrophy present.     Right Adnexa: absent.    Left Adnexa: absent.    Cervix is absent.     Uterus is absent.  Breasts:    Right: Normal.     Left: Normal.  HENT:     Head: Normocephalic.  Neck:     Thyroid: No thyroid mass, thyromegaly or thyroid tenderness.  Cardiovascular:     Rate and Rhythm: Normal rate and regular rhythm.     Heart sounds: Normal heart sounds, S1 normal and S2 normal.  Pulmonary:     Effort: Pulmonary effort is normal.     Breath sounds: Normal breath sounds and air entry.  Abdominal:     General: Bowel sounds are normal. There is no distension.     Palpations: Abdomen is soft. There is no mass.     Tenderness: There is no abdominal tenderness. There is no guarding or rebound.  Musculoskeletal:     Cervical back: Full passive range of motion without pain, normal range of motion and neck supple. No tenderness.     Right lower leg: No edema.     Left lower leg: No edema.  Neurological:     Mental Status: She is alert.  Skin:    General: Skin is warm.  Psychiatric:        Mood and Affect: Mood normal.        Behavior: Behavior normal.        Thought Content: Thought content normal.  Vitals and nursing note reviewed. Exam conducted with a chaperone present.       A:  Well Woman GYN exam, h/o DVT with PE, TVH/BSO, VAIN1                             P:        Pap smear collected today Encouraged annual mammogram screening Colon cancer screening up-to-date DXA ordered today Labs and immunizations to do with PMD Multiple nevus: to get body check with dermatology Encouraged healthy lifestyle practices Encouraged Vit D and Calcium   No follow-ups on file.  Earley Favor

## 2023-03-27 LAB — CYTOLOGY - PAP
Comment: NEGATIVE
Diagnosis: NEGATIVE
High risk HPV: NEGATIVE

## 2023-03-28 ENCOUNTER — Other Ambulatory Visit: Payer: Self-pay

## 2023-03-28 ENCOUNTER — Encounter: Payer: Self-pay | Admitting: Obstetrics and Gynecology

## 2023-03-28 DIAGNOSIS — B9689 Other specified bacterial agents as the cause of diseases classified elsewhere: Secondary | ICD-10-CM

## 2023-03-28 DIAGNOSIS — N760 Acute vaginitis: Secondary | ICD-10-CM

## 2023-03-28 NOTE — Progress Notes (Signed)
 Opened in error

## 2023-04-02 ENCOUNTER — Other Ambulatory Visit: Payer: Self-pay

## 2023-04-02 DIAGNOSIS — I82451 Acute embolism and thrombosis of right peroneal vein: Secondary | ICD-10-CM

## 2023-04-03 ENCOUNTER — Other Ambulatory Visit (HOSPITAL_COMMUNITY): Payer: Self-pay

## 2023-04-03 ENCOUNTER — Inpatient Hospital Stay: Payer: Commercial Managed Care - PPO | Attending: Oncology | Admitting: Oncology

## 2023-04-03 ENCOUNTER — Inpatient Hospital Stay: Payer: Commercial Managed Care - PPO

## 2023-04-03 VITALS — BP 114/86 | HR 73 | Temp 98.9°F | Resp 18 | Wt 186.1 lb

## 2023-04-03 DIAGNOSIS — N760 Acute vaginitis: Secondary | ICD-10-CM | POA: Diagnosis not present

## 2023-04-03 DIAGNOSIS — M545 Low back pain, unspecified: Secondary | ICD-10-CM | POA: Insufficient documentation

## 2023-04-03 DIAGNOSIS — I82451 Acute embolism and thrombosis of right peroneal vein: Secondary | ICD-10-CM

## 2023-04-03 DIAGNOSIS — Z7901 Long term (current) use of anticoagulants: Secondary | ICD-10-CM | POA: Insufficient documentation

## 2023-04-03 DIAGNOSIS — F17210 Nicotine dependence, cigarettes, uncomplicated: Secondary | ICD-10-CM | POA: Insufficient documentation

## 2023-04-03 DIAGNOSIS — Z86711 Personal history of pulmonary embolism: Secondary | ICD-10-CM | POA: Insufficient documentation

## 2023-04-03 DIAGNOSIS — Z79899 Other long term (current) drug therapy: Secondary | ICD-10-CM | POA: Diagnosis not present

## 2023-04-03 DIAGNOSIS — R21 Rash and other nonspecific skin eruption: Secondary | ICD-10-CM | POA: Diagnosis not present

## 2023-04-03 DIAGNOSIS — Z803 Family history of malignant neoplasm of breast: Secondary | ICD-10-CM | POA: Diagnosis not present

## 2023-04-03 LAB — PROTIME-INR
INR: 2 — ABNORMAL HIGH (ref 0.8–1.2)
Prothrombin Time: 22.7 s — ABNORMAL HIGH (ref 11.4–15.2)

## 2023-04-03 MED ORDER — CLINDAMYCIN HCL 300 MG PO CAPS
300.0000 mg | ORAL_CAPSULE | Freq: Three times a day (TID) | ORAL | 0 refills | Status: DC
  Filled 2023-04-03: qty 15, 5d supply, fill #0

## 2023-04-03 MED ORDER — MUPIROCIN CALCIUM 2 % EX CREA
1.0000 | TOPICAL_CREAM | Freq: Two times a day (BID) | CUTANEOUS | 0 refills | Status: DC
  Filled 2023-04-03: qty 15, 8d supply, fill #0

## 2023-04-03 NOTE — Progress Notes (Signed)
 [facility] [address], [place] [address]   Clinic Day:  04/03/23   Referring physician: Babs Sciara, MD  Patient Care Team: Babs Sciara, MD as PCP - General (Family Medicine) Mallipeddi, Orion Modest, MD as PCP - Cardiology (Cardiology) Doreatha Massed, MD as Medical Oncologist (Hematology)   ASSESSMENT & PLAN:   Assessment:  1.  Weekly provoked/unprovoked pulmonary embolism: - She drove back from the beach (4 hours with 1 stop) and noticed right knee pain and evaluated in the ER on 08/03/2022 and 72,024. - Doppler (08/07/2022): Acute DVT involving right peroneal vein, right soleal vein, right gastrocnemius veins. - Eliquis started on 08/07/2022 with loading dose. - Workup with pleuritic pain in the right shoulder/upper back on 08/12/2022. - CT angiogram (08/11/2020): Positive for acute PE with CT evidence of right heart strain consistent with at least submassive PE.  Patchy density in the superior segment of the right lower lobe consistent with infiltrate/infarction. - No history of miscarriages. - Lupus anticoagulant, anticardiolipin and antibeta 2 glycoprotein antibodies negative. - It is not entirely clear if she is resistant to Eliquis.  However given the size of the embolus, recommend switching to anticoagulant to a different class. - Coumadin started on 08/12/2022.  2.  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 - No family history of lupus anticoagulant.  Maternal aunt had breast cancer.  Another maternal aunt had colon cancer.  Plan:  1.  Weekly provoked/unprovoked DVT and PE: - Hematology workup from 08/13/2022 was essentially unremarkable.  Lupus anticoagulant was negative, anticardiolipin and antibeta 2 glycoprotein antibodies were negative.  Factor V Leiden and prothrombin gene mutation testing were negative. - Long-term anticoagulation was recommended. -She did have vascular appointment on  09/26/2022 where a left venous ultrasound was performed in clinic which showed no evidence of popliteal cyst bilaterally but did show DVT involving right peroneal veins.  Findings appeared improved from previous examination. -She has not had a D-dimer since discovery of DVT back in July.  We will add this on at her next visit. -Continue coumadin dosing based on INR with Coumadin clinic in [place].  -Currently taking 5 mg daily except for 7.5 mg 2 days of the week.  Most recent INR from today was 2.0. -RTC in 3 months with labs same day and see provider.   2.  Bacterial vaginosis: -She was evaluated by gynecology on 03/24/2023 for her annual visit and was diagnosed with bacterial vaginosis. -Discussed Flagyl but unfortunately interacted with Coumadin.  Patient did not want MetroGel. -She still having symptoms and is very uncomfortable. -Based on clinical key, clindamycin 300 mg 3 times daily was an acceptable alternative.  Prescription sent for patient.  3.  Rash to left inner thigh: -Gets boils frequently.  Reports new onset earlier this week. -Has been using warm compresses but has not been very effective. -Discussed mupirocin cream that she may apply topically as needed.  If no improvement, would recommend being seen by PCP.    Orders Placed This Encounter  Procedures   CBC with Differential    Standing Status:   Future    Expected Date:   07/01/2023    Expiration Date:   04/02/2024   Comprehensive metabolic panel    Standing Status:   Future    Expected Date:   10/01/2023    Expiration Date:   04/02/2024   Protime-INR    Standing Status:   Future  Expected Date:   07/01/2023    Expiration Date:   04/02/2024   D-dimer, quantitative    Standing Status:   Future    Expected Date:   07/01/2023    Expiration Date:   04/02/2024   I spent 25 minutes dedicated to the care of this patient (face-to-face and non-face-to-face) on the date of the encounter to include what is described in the  assessment and plan.   Mauro Kaufmann, NP   2/27/20251:33 PM  CHIEF COMPLAINT/PURPOSE OF CONSULT:   Diagnosis: DVT  Current Therapy: Warfarin  HISTORY OF PRESENT ILLNESS:   Ruth Robertson is a 58 y.o. female presenting to clinic today for evaluation of right lower extremity DVT. She was last seen in clinic on 11/22/2022.  Denies any hospitalizations, surgeries or changes to her baseline health.  Was diagnosed with COVID back in November 2024 after being exposed to one of her grandchildren.  She continues to have low back pain which is slowly starting to improve.  Reports she was recently diagnosed with bacterial vaginosis and was not prescribed Flagyl due to interaction with Coumadin.  She refused MetroGel.  Reports persistent symptoms and is wondering if there is an alternative antibiotic.  She has also noticed a boil forming in her left inner thigh.  Reports she gets these frequently and they normally resolve with warm compress.  She has been trying this for several days without relief.  States she has been noticing a weird sensation behind her right knee where her blood clot was.  Describes as a tightness without pain. States she is wondering if we repeat imaging routinely to see if blood clot is dissolving.  She did have an ultrasound in August with vein and vascular which showed improvement of right popliteal blood clot.  No bright red blood per rectum, melena or hematochezia.  She is hoping to eventually switch from Coumadin to Eliquis to avoid frequent appointments, interactions with other medications and diet restrictions.  Today, she states that she is doing well overall. Her appetite level is at 100%. Her energy level is low today.   PAST MEDICAL HISTORY:   Past Medical History: Past Medical History:  Diagnosis Date   Anxiety    Asthma    ONLY WITH BRONCHITIS   DVT (deep venous thrombosis) (HCC)    GAD (generalized anxiety disorder)    GERD (gastroesophageal reflux disease)     Microscopic hematuria 12/03/2017   Negative cystoscopy negative CAT scan   Pneumonia    PONV (postoperative nausea and vomiting)     Surgical History: Past Surgical History:  Procedure Laterality Date   AGILE CAPSULE N/A 07/09/2016   Procedure: AGILE CAPSULE;  Surgeon: West Bali, MD;  [facility];  Service: Endoscopy;  Laterality: N/A;  7:30am, pt to arrive at 7:00am   BIOPSY  09/19/2015   Procedure: BIOPSY;  Surgeon: West Bali, MD;  [facility]: [facility];  Service: Endoscopy;;  random colon bx's   COLONOSCOPY WITH PROPOFOL N/A 09/19/2015   Dr. Darrick Penna: five 2-3 mm polyps in rectum (hyperplastic) and descending colon, diverticulosis in sigmoid colon and transverse colon. Query post-infectious IBS. Next colonoscopy in 10 years    CYST EXCISION N/A 01/20/2019   Procedure: EXCISION OF RIGHT GROIN SEBACEOUS CYST;  Surgeon: Harriette Bouillon, MD;  Location: [facility];  Service: General;  Laterality: N/A;   GIVENS CAPSULE STUDY N/A 07/30/2016   Procedure: GIVENS CAPSULE STUDY;  Surgeon: West Bali, MD;  [facility];  Service: Endoscopy;  Laterality: N/A;   HYSTEROSCOPY  2006   POLYPECTOMY  09/19/2015   Procedure: POLYPECTOMY;  Surgeon: West Bali, MD;  [facility];  Service: Endoscopy;;  descending colon polyps x3 cold bx, rectal polyp x2   SALPINGOOPHORECTOMY Bilateral 08/18/2012   Procedure: SALPINGO OOPHORECTOMY;  Surgeon: Dara Lords, MD;  Location: [facility];  Service: Gynecology;  Laterality: Bilateral;   VAGINAL HYSTERECTOMY N/A 08/18/2012   Procedure: HYSTERECTOMY VAGINAL;  Surgeon: Dara Lords, MD;  Location: [facility];  Service: Gynecology;  Laterality: N/A;    Social History: Social History   Socioeconomic History   Marital status: Divorced    Spouse name: Not on file   Number of children: Not on file   Years of education: Not on file   Highest education level: Not on file  Occupational History   Not on  file  Tobacco Use   Smoking status: Every Day    Current packs/day: 1.00    Average packs/day: 1 pack/day for 25.0 years (25.0 ttl pk-yrs)    Types: Cigarettes   Smokeless tobacco: Never  Vaping Use   Vaping status: Never Used  Substance and Sexual Activity   Alcohol use: Not Currently    Alcohol/week: 0.0 standard drinks of alcohol    Comment: rare   Drug use: No   Sexual activity: Not Currently    Partners: Male    Birth control/protection: Surgical    Comment: HYST-1st intercourse 17 yo-5 partners  Other Topics Concern   Not on file  Social History Narrative   Not on file   Social Drivers of Health   Financial Resource Strain: Not on file  Food Insecurity: No Food Insecurity (08/12/2022)   Hunger Vital Sign    Worried About Running Out of Food in the Last Year: Never true    Ran Out of Food in the Last Year: Never true  Transportation Needs: No Transportation Needs (08/12/2022)   PRAPARE - Administrator, Civil Service (Medical): No    Lack of Transportation (Non-Medical): No  Physical Activity: Not on file  Stress: Not on file  Social Connections: Not on file  Intimate Partner Violence: Not At Risk (08/12/2022)   Humiliation, Afraid, Rape, and Kick questionnaire    Fear of Current or Ex-Partner: No    Emotionally Abused: No    Physically Abused: No    Sexually Abused: No    Family History: Family History  Problem Relation Age of Onset   Heart attack Mother    Heart failure Father    Stroke Father    Hypertension Maternal Grandmother    Diabetes Paternal Grandfather    Breast cancer Maternal Aunt        50's   Colon cancer Maternal Aunt        late 62s or 70s   Inflammatory bowel disease Neg Hx     Current Medications:  Current Outpatient Medications:    albuterol (VENTOLIN HFA) 108 (90 Base) MCG/ACT inhaler, Inhale 2 puffs into the lungs every 6 (six) hours as needed for wheezing or shortness of breath., Disp: 8 g, Rfl: 0   clindamycin  (CLEOCIN) 300 MG capsule, Take 1 capsule (300 mg total) by mouth 3 (three) times daily., Disp: 15 capsule, Rfl: 0   mupirocin cream (BACTROBAN) 2 %, Apply 1 Application topically 2 (two) times daily., Disp: 15 g, Rfl: 0   sulfaSALAzine (AZULFIDINE) 500 MG EC tablet, Take 2 tablets (1,000  mg total) by mouth 2 (two) times daily., Disp: 360 tablet, Rfl: 0   triamcinolone (NASACORT) 55 MCG/ACT AERO nasal inhaler, Place 2 sprays into the nose daily., Disp: , Rfl:    warfarin (COUMADIN) 5 MG tablet, Take 1 tablet (5 mg total) by mouth daily., Disp: 34 tablet, Rfl: 3   Allergies: No Known Allergies  REVIEW OF SYSTEMS:   Review of Systems  Gastrointestinal:  Positive for diarrhea.  Musculoskeletal:        Tight sensation behind right knee  Neurological:  Positive for numbness.  Psychiatric/Behavioral:  Positive for sleep disturbance.      VITALS:   Blood pressure 114/86, pulse 73, temperature 98.9 F (37.2 C), temperature source Tympanic, resp. rate 18, weight 186 lb 1.1 oz (84.4 kg), last menstrual period 06/12/2012, SpO2 97%.  Wt Readings from Last 3 Encounters:  04/03/23 186 lb 1.1 oz (84.4 kg)  03/24/23 187 lb (84.8 kg)  02/26/23 184 lb (83.5 kg)    Body mass index is 30.26 kg/m.   PHYSICAL EXAM:   Physical Exam Vitals reviewed.  Constitutional:      Appearance: Normal appearance.  Cardiovascular:     Rate and Rhythm: Normal rate and regular rhythm.  Pulmonary:     Effort: Pulmonary effort is normal.     Breath sounds: Normal breath sounds.  Abdominal:     General: Bowel sounds are normal.     Palpations: Abdomen is soft.  Musculoskeletal:        General: No swelling. Normal range of motion.     Right knee: Normal.     Left knee: Normal.     Right lower leg: No edema.     Left lower leg: No edema.  Neurological:     Mental Status: She is alert and oriented to person, place, and time. Mental status is at baseline.     LABS:      Latest Ref Rng & Units 09/24/2022     1:23 AM 08/12/2022   12:01 PM 08/05/2022   10:29 PM  CBC  WBC 4.0 - 10.5 K/uL 6.4  11.2  10.3   Hemoglobin 12.0 - 15.0 g/dL 08.6  57.8  46.9   Hematocrit 36.0 - 46.0 % 40.5  47.4  45.0   Platelets 150 - 400 K/uL 233  307  221       Latest Ref Rng & Units 09/24/2022    1:23 AM 08/13/2022    5:18 AM 08/12/2022   12:01 PM  CMP  Glucose 70 - 99 mg/dL 629  528  97   BUN 6 - 20 mg/dL 17  16  19    Creatinine 0.44 - 1.00 mg/dL 4.13  2.44  0.10   Sodium 135 - 145 mmol/L 140  137  138   Potassium 3.5 - 5.1 mmol/L 3.6  3.9  3.9   Chloride 98 - 111 mmol/L 106  104  103   CO2 22 - 32 mmol/L 24  23  24    Calcium 8.9 - 10.3 mg/dL 8.5  8.9  9.3   Total Protein 6.5 - 8.1 g/dL 6.4     Total Bilirubin 0.3 - 1.2 mg/dL 0.6     Alkaline Phos 38 - 126 U/L 54     AST 15 - 41 U/L 16     ALT 0 - 44 U/L 23        No results found for: "CEA1", "CEA" / No results found for: "CEA1", "CEA" No results found for: "  PSA1" No results found for: "ZOX096" No results found for: "CAN125"  No results found for: "TOTALPROTELP", "ALBUMINELP", "A1GS", "A2GS", "BETS", "BETA2SER", "GAMS", "MSPIKE", "SPEI" Lab Results  Component Value Date   TIBC 338 02/28/2021   FERRITIN 42 02/28/2021   FERRITIN 30 12/18/2013   IRONPCTSAT 33 (H) 02/28/2021   No results found for: "LDH"   STUDIES:   No results found.

## 2023-04-16 ENCOUNTER — Encounter: Payer: Self-pay | Admitting: Oncology

## 2023-04-16 ENCOUNTER — Ambulatory Visit: Payer: Commercial Managed Care - PPO | Attending: Internal Medicine | Admitting: *Deleted

## 2023-04-16 DIAGNOSIS — I2699 Other pulmonary embolism without acute cor pulmonale: Secondary | ICD-10-CM

## 2023-04-16 DIAGNOSIS — Z5181 Encounter for therapeutic drug level monitoring: Secondary | ICD-10-CM | POA: Diagnosis not present

## 2023-04-16 DIAGNOSIS — I82451 Acute embolism and thrombosis of right peroneal vein: Secondary | ICD-10-CM | POA: Diagnosis not present

## 2023-04-16 LAB — POCT INR: INR: 2.3 (ref 2.0–3.0)

## 2023-04-16 NOTE — Patient Instructions (Signed)
 Continue taking warfarin 1 tablet (5mg ) daily.  Be consistent with green intake.  Recheck in 5 wks

## 2023-04-23 ENCOUNTER — Inpatient Hospital Stay (HOSPITAL_BASED_OUTPATIENT_CLINIC_OR_DEPARTMENT_OTHER): Admission: RE | Admit: 2023-04-23 | Payer: Commercial Managed Care - PPO | Source: Ambulatory Visit

## 2023-04-25 ENCOUNTER — Ambulatory Visit
Admission: RE | Admit: 2023-04-25 | Discharge: 2023-04-25 | Disposition: A | Discharge status: Home / Self Care | Payer: Commercial Managed Care - PPO | Source: Ambulatory Visit | Attending: Family Medicine | Admitting: Family Medicine

## 2023-04-25 DIAGNOSIS — Z1231 Encounter for screening mammogram for malignant neoplasm of breast: Secondary | ICD-10-CM | POA: Diagnosis not present

## 2023-05-19 DIAGNOSIS — L218 Other seborrheic dermatitis: Secondary | ICD-10-CM | POA: Diagnosis not present

## 2023-05-20 ENCOUNTER — Ambulatory Visit: Attending: Internal Medicine | Admitting: *Deleted

## 2023-05-20 DIAGNOSIS — Z5181 Encounter for therapeutic drug level monitoring: Secondary | ICD-10-CM

## 2023-05-20 DIAGNOSIS — I82451 Acute embolism and thrombosis of right peroneal vein: Secondary | ICD-10-CM | POA: Diagnosis not present

## 2023-05-20 DIAGNOSIS — I2699 Other pulmonary embolism without acute cor pulmonale: Secondary | ICD-10-CM | POA: Diagnosis not present

## 2023-05-20 LAB — POCT INR: INR: 2.5 (ref 2.0–3.0)

## 2023-05-20 NOTE — Patient Instructions (Signed)
 Continue taking warfarin 1 tablet (5mg ) daily.  Be consistent with green intake.  Recheck in 6 wks

## 2023-06-03 ENCOUNTER — Other Ambulatory Visit (HOSPITAL_COMMUNITY): Payer: Self-pay

## 2023-06-27 ENCOUNTER — Inpatient Hospital Stay: Payer: Commercial Managed Care - PPO | Attending: Oncology

## 2023-06-27 DIAGNOSIS — K219 Gastro-esophageal reflux disease without esophagitis: Secondary | ICD-10-CM | POA: Diagnosis not present

## 2023-06-27 DIAGNOSIS — F17210 Nicotine dependence, cigarettes, uncomplicated: Secondary | ICD-10-CM | POA: Insufficient documentation

## 2023-06-27 DIAGNOSIS — Z923 Personal history of irradiation: Secondary | ICD-10-CM | POA: Diagnosis not present

## 2023-06-27 DIAGNOSIS — Z7901 Long term (current) use of anticoagulants: Secondary | ICD-10-CM | POA: Diagnosis not present

## 2023-06-27 DIAGNOSIS — Z800 Family history of malignant neoplasm of digestive organs: Secondary | ICD-10-CM | POA: Diagnosis not present

## 2023-06-27 DIAGNOSIS — Z803 Family history of malignant neoplasm of breast: Secondary | ICD-10-CM | POA: Diagnosis not present

## 2023-06-27 DIAGNOSIS — Z86711 Personal history of pulmonary embolism: Secondary | ICD-10-CM | POA: Diagnosis not present

## 2023-06-27 DIAGNOSIS — Z86718 Personal history of other venous thrombosis and embolism: Secondary | ICD-10-CM | POA: Insufficient documentation

## 2023-06-27 DIAGNOSIS — I82451 Acute embolism and thrombosis of right peroneal vein: Secondary | ICD-10-CM

## 2023-06-27 LAB — CBC WITH DIFFERENTIAL/PLATELET
Abs Immature Granulocytes: 0.03 10*3/uL (ref 0.00–0.07)
Basophils Absolute: 0.1 10*3/uL (ref 0.0–0.1)
Basophils Relative: 1 %
Eosinophils Absolute: 0.2 10*3/uL (ref 0.0–0.5)
Eosinophils Relative: 2 %
HCT: 45.1 % (ref 36.0–46.0)
Hemoglobin: 14.5 g/dL (ref 12.0–15.0)
Immature Granulocytes: 0 %
Lymphocytes Relative: 24 %
Lymphs Abs: 2 10*3/uL (ref 0.7–4.0)
MCH: 30.4 pg (ref 26.0–34.0)
MCHC: 32.2 g/dL (ref 30.0–36.0)
MCV: 94.5 fL (ref 80.0–100.0)
Monocytes Absolute: 0.7 10*3/uL (ref 0.1–1.0)
Monocytes Relative: 8 %
Neutro Abs: 5.5 10*3/uL (ref 1.7–7.7)
Neutrophils Relative %: 65 %
Platelets: 240 10*3/uL (ref 150–400)
RBC: 4.77 MIL/uL (ref 3.87–5.11)
RDW: 13.2 % (ref 11.5–15.5)
WBC: 8.5 10*3/uL (ref 4.0–10.5)
nRBC: 0 % (ref 0.0–0.2)

## 2023-06-27 LAB — COMPREHENSIVE METABOLIC PANEL WITH GFR
ALT: 27 U/L (ref 0–44)
AST: 18 U/L (ref 15–41)
Albumin: 3.8 g/dL (ref 3.5–5.0)
Alkaline Phosphatase: 62 U/L (ref 38–126)
Anion gap: 7 (ref 5–15)
BUN: 13 mg/dL (ref 6–20)
CO2: 24 mmol/L (ref 22–32)
Calcium: 9.3 mg/dL (ref 8.9–10.3)
Chloride: 108 mmol/L (ref 98–111)
Creatinine, Ser: 0.98 mg/dL (ref 0.44–1.00)
GFR, Estimated: >60 mL/min (ref >60)
Glucose, Bld: 85 mg/dL (ref 70–99)
Potassium: 3.6 mmol/L (ref 3.5–5.1)
Sodium: 139 mmol/L (ref 135–145)
Total Bilirubin: 0.3 mg/dL (ref 0.0–1.2)
Total Protein: 6.9 g/dL (ref 6.5–8.1)

## 2023-06-27 LAB — PROTIME-INR
INR: 2.1 — ABNORMAL HIGH (ref 0.8–1.2)
Prothrombin Time: 23.5 s — ABNORMAL HIGH (ref 11.4–15.2)

## 2023-06-27 LAB — D-DIMER, QUANTITATIVE: D-Dimer, Quant: <0.27 ug{FEU}/mL (ref 0.00–0.50)

## 2023-07-01 ENCOUNTER — Other Ambulatory Visit (HOSPITAL_COMMUNITY): Payer: Self-pay

## 2023-07-01 ENCOUNTER — Ambulatory Visit: Attending: Internal Medicine | Admitting: *Deleted

## 2023-07-01 DIAGNOSIS — Z79899 Other long term (current) drug therapy: Secondary | ICD-10-CM | POA: Diagnosis not present

## 2023-07-01 DIAGNOSIS — Z5181 Encounter for therapeutic drug level monitoring: Secondary | ICD-10-CM

## 2023-07-01 DIAGNOSIS — I2699 Other pulmonary embolism without acute cor pulmonale: Secondary | ICD-10-CM | POA: Diagnosis not present

## 2023-07-01 DIAGNOSIS — I82451 Acute embolism and thrombosis of right peroneal vein: Secondary | ICD-10-CM | POA: Diagnosis not present

## 2023-07-01 DIAGNOSIS — M47819 Spondylosis without myelopathy or radiculopathy, site unspecified: Secondary | ICD-10-CM | POA: Diagnosis not present

## 2023-07-01 LAB — POCT INR: INR: 2.5 (ref 2.0–3.0)

## 2023-07-01 MED ORDER — SULFASALAZINE 500 MG PO TBEC
1000.0000 mg | DELAYED_RELEASE_TABLET | Freq: Two times a day (BID) | ORAL | 0 refills | Status: DC
  Filled 2023-07-01: qty 360, 90d supply, fill #0

## 2023-07-01 NOTE — Patient Instructions (Signed)
 Continue taking warfarin 1 tablet (5mg ) daily.  Be consistent with green intake.  Recheck in 6 wks

## 2023-07-02 ENCOUNTER — Other Ambulatory Visit (HOSPITAL_COMMUNITY): Payer: Self-pay

## 2023-07-03 ENCOUNTER — Inpatient Hospital Stay (HOSPITAL_BASED_OUTPATIENT_CLINIC_OR_DEPARTMENT_OTHER): Payer: Commercial Managed Care - PPO | Admitting: Oncology

## 2023-07-03 VITALS — BP 138/99 | HR 70 | Temp 97.1°F | Resp 18 | Ht 65.5 in | Wt 188.0 lb

## 2023-07-03 DIAGNOSIS — Z800 Family history of malignant neoplasm of digestive organs: Secondary | ICD-10-CM | POA: Diagnosis not present

## 2023-07-03 DIAGNOSIS — Z923 Personal history of irradiation: Secondary | ICD-10-CM | POA: Diagnosis not present

## 2023-07-03 DIAGNOSIS — Z86718 Personal history of other venous thrombosis and embolism: Secondary | ICD-10-CM | POA: Diagnosis not present

## 2023-07-03 DIAGNOSIS — Z803 Family history of malignant neoplasm of breast: Secondary | ICD-10-CM | POA: Diagnosis not present

## 2023-07-03 DIAGNOSIS — F17210 Nicotine dependence, cigarettes, uncomplicated: Secondary | ICD-10-CM | POA: Diagnosis not present

## 2023-07-03 DIAGNOSIS — K219 Gastro-esophageal reflux disease without esophagitis: Secondary | ICD-10-CM | POA: Diagnosis not present

## 2023-07-03 DIAGNOSIS — I82451 Acute embolism and thrombosis of right peroneal vein: Secondary | ICD-10-CM

## 2023-07-03 DIAGNOSIS — Z7901 Long term (current) use of anticoagulants: Secondary | ICD-10-CM | POA: Diagnosis not present

## 2023-07-03 DIAGNOSIS — Z86711 Personal history of pulmonary embolism: Secondary | ICD-10-CM | POA: Diagnosis not present

## 2023-07-03 NOTE — Progress Notes (Signed)
 [facility] [address], [place] [address]   Clinic Day:  07/03/23   Referring physician: Bennet Brasil, MD  Patient Care Team: Bennet Brasil, MD as PCP - General (Family Medicine) Mallipeddi, Kennyth Pean, MD as PCP - Cardiology (Cardiology) Paulett Boros, MD as Medical Oncologist (Hematology)   ASSESSMENT & PLAN:   Assessment:  1.  Weekly provoked/unprovoked pulmonary embolism: - [place] drove back from the beach (4 hours with 1 stop) and noticed right knee pain and evaluated in the ER on 08/03/2022 and 72,024. - Doppler (08/07/2022): Acute DVT involving right peroneal vein, right soleal vein, right gastrocnemius veins. - Eliquis  started on 08/07/2022 with loading dose. - Workup with pleuritic pain in the right shoulder/upper back on 08/12/2022. - CT angiogram (08/11/2020): Positive for acute PE with CT evidence of right heart strain consistent with at least submassive PE.  Patchy density in the superior segment of the right lower lobe consistent with infiltrate/infarction. - No history of miscarriages. - Lupus anticoagulant, anticardiolipin and antibeta 2 glycoprotein antibodies negative. - It is not entirely clear if she is resistant to Eliquis .  However given the size of the embolus, recommend switching to anticoagulant to a different class. - Coumadin  started on 08/12/2022.  2.  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 - No family history of lupus anticoagulant.  Maternal aunt had breast cancer.  Another maternal aunt had colon cancer.  Plan:  1.  Weekly provoked/unprovoked DVT and PE: - Hematology workup from 08/13/2022 was essentially unremarkable.  Lupus anticoagulant was negative, anticardiolipin and antibeta 2 glycoprotein antibodies were negative.  Factor V Leiden and prothrombin gene mutation testing were negative. - Long-term anticoagulation was recommended. -She did have vascular appointment on  09/26/2022 where a left venous ultrasound was performed in clinic which showed no evidence of popliteal cyst bilaterally but did show DVT involving right peroneal veins.  Findings appeared improved from previous examination. - D-dimer was undetectable on 06/27/2023. -Continue coumadin  dosing based on INR with Coumadin  clinic in [place].  -Currently taking 5 mg daily.  Most recent INR was 2.5. -Patient is interested in having a repeat Doppler prior to her next visit to see if clot has resolved.  She would like to come off coagulation or switch to Xarelto to avoid frequent appointments and dietary restrictions. -RTC in 3 months with labs same day and see provider virtually.     Orders Placed This Encounter  Procedures   US  Venous Img Lower Unilateral Left    Standing Status:   Future    Expected Date:   10/03/2023    Expiration Date:   07/02/2024    Reason for Exam (SYMPTOM  OR DIAGNOSIS REQUIRED):   Hx of dvt    Preferred imaging location?:   [facility]   I spent 25 minutes dedicated to the care of this patient (face-to-face and non-face-to-face) on the date of the encounter to include what is described in the assessment and plan.   Aurther Blue, NP   5/29/202510:05 AM  CHIEF COMPLAINT/PURPOSE OF CONSULT:   Diagnosis: DVT  Current Therapy: Warfarin  HISTORY OF PRESENT ILLNESS:   Ruth Robertson is a 58 y.o. female presenting to clinic today for evaluation of right lower extremity DVT. She was last seen in clinic on 04/03/23.  Denies any hospitalizations, surgeries or changes to her baseline health.    She was treated for bacterial vaginosis with clindamycin  at her last  visit.  This has resolved.  Reports she is interested in switching from Coumadin  to Eliquis  to avoid frequent appointments, reactions to medications and diet restrictions.  She denies any or concern for repeat blood clot  She is followed by the Coumadin  clinic in [place].  Her last INR from 07/01/2023 was 2.5 which is in  her 2-3 INR goal.  She is currently taking 5 mg Coumadin  daily.  Reports her blood pressure is elevated today.  She is not sure why and it is concerning her.  Today, she states that she is doing well overall. Her appetite level is at 100%. Her energy level is 50% today.   PAST MEDICAL HISTORY:   Past Medical History: Past Medical History:  Diagnosis Date   Anxiety    Asthma    ONLY WITH BRONCHITIS   DVT (deep venous thrombosis) (HCC)    GAD (generalized anxiety disorder)    GERD (gastroesophageal reflux disease)    Microscopic hematuria 12/03/2017   Negative cystoscopy negative CAT scan   Pneumonia    PONV (postoperative nausea and vomiting)     Surgical History: Past Surgical History:  Procedure Laterality Date   AGILE CAPSULE N/A 07/09/2016   Procedure: AGILE CAPSULE;  Surgeon: Alyce Jubilee, MD;  [facility];  Service: Endoscopy;  Laterality: N/A;  7:30am, pt to arrive at 7:00am   BIOPSY  09/19/2015   Procedure: BIOPSY;  Surgeon: Alyce Jubilee, MD;  [facility];  Service: Endoscopy;;  random colon bx's   COLONOSCOPY WITH PROPOFOL  N/A 09/19/2015   Dr. Nolene Baumgarten: five 2-3 mm polyps in rectum (hyperplastic) and descending colon, diverticulosis in sigmoid colon and transverse colon. Query post-infectious IBS. Next colonoscopy in 10 years    CYST EXCISION N/A 01/20/2019   Procedure: EXCISION OF RIGHT GROIN SEBACEOUS CYST;  Surgeon: Sim Dryer, MD;  Location: [facility];  Service: General;  Laterality: N/A;   GIVENS CAPSULE STUDY N/A 07/30/2016   Procedure: GIVENS CAPSULE STUDY;  Surgeon: Alyce Jubilee, MD;  [facility];  Service: Endoscopy;  Laterality: N/A;   HYSTEROSCOPY  2006   POLYPECTOMY  09/19/2015   Procedure: POLYPECTOMY;  Surgeon: Alyce Jubilee, MD;  [facility];  Service: Endoscopy;;  descending colon polyps x3 cold bx, rectal polyp x2   SALPINGOOPHORECTOMY Bilateral 08/18/2012   Procedure: SALPINGO  OOPHORECTOMY;  Surgeon: Lacretia Piccolo, MD;  Location: [facility];  Service: Gynecology;  Laterality: Bilateral;   VAGINAL HYSTERECTOMY N/A 08/18/2012   Procedure: HYSTERECTOMY VAGINAL;  Surgeon: Lacretia Piccolo, MD;  Location: [facility];  Service: Gynecology;  Laterality: N/A;    Social History: Social History   Socioeconomic History   Marital status: Divorced    Spouse name: Not on file   Number of children: Not on file   Years of education: Not on file   Highest education level: Not on file  Occupational History   Not on file  Tobacco Use   Smoking status: Every Day    Current packs/day: 1.00    Average packs/day: 1 pack/day for 25.0 years (25.0 ttl pk-yrs)    Types: Cigarettes   Smokeless tobacco: Never  Vaping Use   Vaping status: Never Used  Substance and Sexual Activity   Alcohol use: Not Currently    Alcohol/week: 0.0 standard drinks of alcohol    Comment: rare   Drug use: No   Sexual activity: Not Currently    Partners: Male    Birth  control/protection: Surgical    Comment: HYST-1st intercourse 17 yo-5 partners  Other Topics Concern   Not on file  Social History Narrative   Not on file   Social Drivers of Health   Financial Resource Strain: Not on file  Food Insecurity: No Food Insecurity (08/12/2022)   Hunger Vital Sign    Worried About Running Out of Food in the Last Year: Never true    Ran Out of Food in the Last Year: Never true  Transportation Needs: No Transportation Needs (08/12/2022)   PRAPARE - Administrator, Civil Service (Medical): No    Lack of Transportation (Non-Medical): No  Physical Activity: Not on file  Stress: Not on file  Social Connections: Not on file  Intimate Partner Violence: Not At Risk (08/12/2022)   Humiliation, Afraid, Rape, and Kick questionnaire    Fear of Current or Ex-Partner: No    Emotionally Abused: No    Physically Abused: No    Sexually Abused: No    Family History: Family History  Problem Relation Age  of Onset   Heart attack Mother    Heart failure Father    Stroke Father    Hypertension Maternal Grandmother    Diabetes Paternal Grandfather    Breast cancer Maternal Aunt        50's   Colon cancer Maternal Aunt        late 78s or 70s   Inflammatory bowel disease Neg Hx     Current Medications:  Current Outpatient Medications:    albuterol  (VENTOLIN  HFA) 108 (90 Base) MCG/ACT inhaler, Inhale 2 puffs into the lungs every 6 (six) hours as needed for wheezing or shortness of breath., Disp: 8 g, Rfl: 0   sulfaSALAzine  (AZULFIDINE ) 500 MG EC tablet, Take 2 tablets (1,000 mg total) by mouth 2 (two) times daily., Disp: 360 tablet, Rfl: 0   warfarin (COUMADIN ) 5 MG tablet, Take 1 tablet (5 mg total) by mouth daily., Disp: 34 tablet, Rfl: 3   Allergies: No Known Allergies  REVIEW OF SYSTEMS:   Review of Systems  Constitutional:  Negative for fatigue.  Respiratory:  Positive for shortness of breath (at times).   Gastrointestinal:  Positive for nausea.     VITALS:   Blood pressure (!) 138/99, pulse 70, temperature (!) 97.1 F (36.2 C), temperature source Tympanic, resp. rate 18, height 5' 5.5" (1.664 m), weight 188 lb (85.3 kg), last menstrual period 06/12/2012, SpO2 97%.  Wt Readings from Last 3 Encounters:  07/03/23 188 lb (85.3 kg)  04/03/23 186 lb 1.1 oz (84.4 kg)  03/24/23 187 lb (84.8 kg)    Body mass index is 30.81 kg/m.   PHYSICAL EXAM:   Physical Exam Constitutional:      Appearance: Normal appearance.  Cardiovascular:     Rate and Rhythm: Normal rate and regular rhythm.  Pulmonary:     Effort: Pulmonary effort is normal.     Breath sounds: Normal breath sounds.  Abdominal:     General: Bowel sounds are normal.     Palpations: Abdomen is soft.  Musculoskeletal:        General: No swelling. Normal range of motion.  Neurological:     Mental Status: She is alert and oriented to person, place, and time. Mental status is at baseline.     LABS:      Latest  Ref Rng & Units 06/27/2023   12:19 PM 09/24/2022    1:23 AM 08/12/2022   12:01 PM  CBC  WBC 4.0 - 10.5 K/uL 8.5  6.4  11.2   Hemoglobin 12.0 - 15.0 g/dL 16.1  09.6  04.5   Hematocrit 36.0 - 46.0 % 45.1  40.5  47.4   Platelets 150 - 400 K/uL 240  233  307       Latest Ref Rng & Units 06/27/2023   12:19 PM 09/24/2022    1:23 AM 08/13/2022    5:18 AM  CMP  Glucose 70 - 99 mg/dL 85  409  811   BUN 6 - 20 mg/dL 13  17  16    Creatinine 0.44 - 1.00 mg/dL 9.14  7.82  9.56   Sodium 135 - 145 mmol/L 139  140  137   Potassium 3.5 - 5.1 mmol/L 3.6  3.6  3.9   Chloride 98 - 111 mmol/L 108  106  104   CO2 22 - 32 mmol/L 24  24  23    Calcium  8.9 - 10.3 mg/dL 9.3  8.5  8.9   Total Protein 6.5 - 8.1 g/dL 6.9  6.4    Total Bilirubin 0.0 - 1.2 mg/dL 0.3  0.6    Alkaline Phos 38 - 126 U/L 62  54    AST 15 - 41 U/L 18  16    ALT 0 - 44 U/L 27  23       No results found for: "CEA1", "CEA" / No results found for: "CEA1", "CEA" No results found for: "PSA1" No results found for: "OZH086" No results found for: "CAN125"  No results found for: "TOTALPROTELP", "ALBUMINELP", "A1GS", "A2GS", "BETS", "BETA2SER", "GAMS", "MSPIKE", "SPEI" Lab Results  Component Value Date   TIBC 338 02/28/2021   FERRITIN 42 02/28/2021   FERRITIN 30 12/18/2013   IRONPCTSAT 33 (H) 02/28/2021   No results found for: "LDH"   STUDIES:   No results found.

## 2023-07-04 NOTE — Addendum Note (Signed)
 Addended by: Charlton Cooler E on: 07/04/2023 03:26 PM   Modules accepted: Orders

## 2023-07-07 ENCOUNTER — Encounter: Payer: Self-pay | Admitting: Family Medicine

## 2023-07-07 ENCOUNTER — Ambulatory Visit: Admitting: Family Medicine

## 2023-07-07 ENCOUNTER — Ambulatory Visit: Payer: Self-pay | Admitting: *Deleted

## 2023-07-07 ENCOUNTER — Ambulatory Visit: Payer: Self-pay | Admitting: Family Medicine

## 2023-07-07 VITALS — BP 126/85 | HR 66 | Temp 98.4°F | Ht 65.5 in | Wt 187.4 lb

## 2023-07-07 DIAGNOSIS — H538 Other visual disturbances: Secondary | ICD-10-CM | POA: Diagnosis not present

## 2023-07-07 DIAGNOSIS — E785 Hyperlipidemia, unspecified: Secondary | ICD-10-CM | POA: Diagnosis not present

## 2023-07-07 DIAGNOSIS — E559 Vitamin D deficiency, unspecified: Secondary | ICD-10-CM | POA: Diagnosis not present

## 2023-07-07 LAB — POCT GLUCOSE FINGERSTICK: Glucose: 99 (ref 70–99)

## 2023-07-07 NOTE — Progress Notes (Addendum)
   Subjective:    Patient ID: Ruth Robertson, female    DOB: 09-01-1965, 58 y.o.   MRN: [id number]  HPI Pt comes in today for elevated BP for at least a week. Pt also has complaints of vision worsening, pt has not seen eye Dr in a little over a year.  Medications and allergies reviewed. Patient relates that she has various related blurred vision issues that come and go she relates that she was told by her eye specialist she was starting get cataract she states she is trying to schedule an appointment.  She does relate that her blood pressure has been borderline and elevated a few different times over the past couple months in the past is always been normal looking She is a smoker she knows she needs to quit We did discuss quitting strategies She denies chest pain shortness of breath She does some walking of her grandchild in a stroller She states she could do better dietary Patient also dealing with significant obesity BMI 30 range  Review of Systems Denies chest pain shortness of breath    Objective:   Physical Exam Lungs clear respiratory rate normal heart is regular pulses normal extremities no edema skin warm dry blood pressure borderline on 2 occasions 128/88 128/92  Her blurred vision is just more that she cannot see small print as well as she used to I encouraged her to get back in with the eye doctor her glucose is normal.     Assessment & Plan:  Elevated blood pressure Dietary measures exercise quitting smoking Follow-up in 3 months Lab work ordered The healthier diet recommended Patient states that she may be coming off Coumadin  later this year if so she states she intends to eat more vegetables Moderate obesity Patient interested in Zepbound Unlikely to be covered for her We will look into phenteramine topiramate

## 2023-07-07 NOTE — Telephone Encounter (Signed)
  Chief Complaint: blurred vision elevated BP yesterday Symptoms: blurred vision, checked BP yesterday 154/99 at son's house. Reports blurred vision going on for a while. Hx DVTs take coumadin  Frequency: yesterday  Pertinent Negatives: Patient denies chest pain no difficulty breathing no slurred speech no weakness either side of body . No headache no N/T Disposition: [] ED /[] Urgent Care (no appt availability in office) / [x] Appointment(In office/virtual)/ []  [facility]/ [] Home Care/ [] Refused Recommended Disposition /[] [place]/ []  Follow-up with PCP Additional Notes:   Appt scheduled today with PCP. Recommended if sx worsen  go to ED.     Copied from CRM [id number]. Topic: Clinical - Red Word Triage >> Jul 07, 2023  8:13 AM El Gravely T wrote: [facility] that prompted transfer to Nurse Triage: Elevated Blood pressure, taken 154/99, patient having blurry vision, unable to see clearly Reason for Disposition  [1] Systolic BP  >= 200 OR Diastolic >= 120 AND [2] having NO cardiac or neurologic symptoms    Blurred vision  Answer Assessment - Initial Assessment Questions 1. BLOOD PRESSURE: "What is the blood pressure?" "Did you take at least two measurements 5 minutes apart?"     Checked yesterday 154/99 no BP monitor available today  2. ONSET: "When did you take your blood pressure?"     yesterday 3. HOW: "How did you take your blood pressure?" (e.g., automatic home BP monitor, visiting nurse)     Son checked yesterday  4. HISTORY: "Do you have a history of high blood pressure?"     No  5. MEDICINES: "Are you taking any medicines for blood pressure?" "Have you missed any doses recently?"     Na  6. OTHER SYMPTOMS: "Do you have any symptoms?" (e.g., blurred vision, chest pain, difficulty breathing, headache, weakness)     Blurred vision  hx DVTs 7. PREGNANCY: "Is there any chance you are pregnant?" "When was your last menstrual period?"     na  Protocols used: Blood  Pressure - High-A-AH

## 2023-07-09 ENCOUNTER — Other Ambulatory Visit (HOSPITAL_COMMUNITY): Payer: Self-pay

## 2023-07-22 ENCOUNTER — Other Ambulatory Visit: Payer: Self-pay | Admitting: Family Medicine

## 2023-07-22 ENCOUNTER — Other Ambulatory Visit (HOSPITAL_COMMUNITY): Payer: Self-pay

## 2023-07-22 MED ORDER — PHENTERMINE-TOPIRAMATE ER 3.75-23 MG PO CP24
1.0000 | ORAL_CAPSULE | Freq: Every morning | ORAL | 2 refills | Status: DC
  Filled 2023-07-22: qty 14, 14d supply, fill #0
  Filled 2023-07-28: qty 30, 30d supply, fill #0
  Filled 2023-09-16 – 2023-10-03 (×4): qty 30, 30d supply, fill #1

## 2023-07-23 ENCOUNTER — Telehealth: Payer: Self-pay | Admitting: Pharmacy Technician

## 2023-07-23 ENCOUNTER — Other Ambulatory Visit (HOSPITAL_COMMUNITY): Payer: Self-pay

## 2023-07-23 NOTE — Telephone Encounter (Signed)
 Pharmacy Patient Advocate Encounter   Received notification from CoverMyMeds that prior authorization for Qsymia 3.75-23MG  er capsules is required/requested.   Insurance verification completed.   The patient is insured through [facility] .   Per test claim: PA required; PA submitted to above mentioned insurance via CoverMyMeds Key/confirmation #/EOC [facility] Status is pending

## 2023-07-28 ENCOUNTER — Other Ambulatory Visit (HOSPITAL_COMMUNITY): Payer: Self-pay

## 2023-07-28 NOTE — Telephone Encounter (Signed)
 Pharmacy Patient Advocate Encounter  Received notification from [facility] that Prior Authorization for Qsymia  3.75-23MG  er capsules has been APPROVED from 07/25/2023 to 08/08/2023. Ran test claim, Copay is $2.00. This test claim was processed through [facility]- copay amounts may vary at other pharmacies due to pharmacy/plan contracts, or as the patient moves through the different stages of their insurance plan.   PA #/Case ID/Reference #: [id number]

## 2023-07-29 ENCOUNTER — Other Ambulatory Visit (HOSPITAL_COMMUNITY): Payer: Self-pay

## 2023-08-11 ENCOUNTER — Other Ambulatory Visit (HOSPITAL_COMMUNITY): Payer: Self-pay

## 2023-08-12 ENCOUNTER — Ambulatory Visit: Attending: Internal Medicine | Admitting: *Deleted

## 2023-08-12 DIAGNOSIS — I2699 Other pulmonary embolism without acute cor pulmonale: Secondary | ICD-10-CM

## 2023-08-12 DIAGNOSIS — Z5181 Encounter for therapeutic drug level monitoring: Secondary | ICD-10-CM | POA: Diagnosis not present

## 2023-08-12 DIAGNOSIS — I82451 Acute embolism and thrombosis of right peroneal vein: Secondary | ICD-10-CM

## 2023-08-12 LAB — POCT INR: INR: 2.1 (ref 2.0–3.0)

## 2023-08-12 NOTE — Patient Instructions (Signed)
 Continue taking warfarin 1 tablet (5mg ) daily.  Be consistent with green intake.  Recheck in 6 wks

## 2023-08-12 NOTE — Progress Notes (Signed)
Please see anticoagulation encounter.

## 2023-08-25 ENCOUNTER — Ambulatory Visit: Admitting: Dermatology

## 2023-09-16 ENCOUNTER — Other Ambulatory Visit (HOSPITAL_COMMUNITY): Payer: Self-pay

## 2023-09-16 ENCOUNTER — Other Ambulatory Visit: Payer: Self-pay

## 2023-09-16 MED ORDER — SULFASALAZINE 500 MG PO TBEC
1000.0000 mg | DELAYED_RELEASE_TABLET | Freq: Two times a day (BID) | ORAL | 0 refills | Status: DC
  Filled 2023-09-16: qty 360, 90d supply, fill #0

## 2023-09-18 ENCOUNTER — Other Ambulatory Visit (HOSPITAL_COMMUNITY): Payer: Self-pay

## 2023-09-23 ENCOUNTER — Ambulatory Visit: Attending: Internal Medicine | Admitting: *Deleted

## 2023-09-23 DIAGNOSIS — Z5181 Encounter for therapeutic drug level monitoring: Secondary | ICD-10-CM | POA: Diagnosis not present

## 2023-09-23 DIAGNOSIS — I2699 Other pulmonary embolism without acute cor pulmonale: Secondary | ICD-10-CM | POA: Diagnosis not present

## 2023-09-23 DIAGNOSIS — I82451 Acute embolism and thrombosis of right peroneal vein: Secondary | ICD-10-CM

## 2023-09-23 LAB — POCT INR: INR: 1.9 — AB (ref 2.0–3.0)

## 2023-09-23 NOTE — Patient Instructions (Signed)
 Take warfarin 1 1/2 tablets tonight then resume 1 tablet (5mg ) daily.  Be consistent with green intake.  Recheck in 6 wks

## 2023-09-23 NOTE — Progress Notes (Signed)
 INR 1.9; Please see anticoagulation encounter

## 2023-09-24 ENCOUNTER — Other Ambulatory Visit (HOSPITAL_COMMUNITY): Payer: Self-pay

## 2023-09-24 ENCOUNTER — Inpatient Hospital Stay: Attending: Oncology

## 2023-09-24 ENCOUNTER — Other Ambulatory Visit: Payer: Self-pay | Admitting: Oncology

## 2023-09-24 ENCOUNTER — Ambulatory Visit (HOSPITAL_COMMUNITY)
Admission: RE | Admit: 2023-09-24 | Discharge: 2023-09-24 | Disposition: A | Discharge status: Home / Self Care | Source: Ambulatory Visit | Attending: Oncology | Admitting: Oncology

## 2023-09-24 DIAGNOSIS — Z803 Family history of malignant neoplasm of breast: Secondary | ICD-10-CM | POA: Insufficient documentation

## 2023-09-24 DIAGNOSIS — Z86711 Personal history of pulmonary embolism: Secondary | ICD-10-CM | POA: Insufficient documentation

## 2023-09-24 DIAGNOSIS — I82451 Acute embolism and thrombosis of right peroneal vein: Secondary | ICD-10-CM

## 2023-09-24 DIAGNOSIS — Z7901 Long term (current) use of anticoagulants: Secondary | ICD-10-CM | POA: Insufficient documentation

## 2023-09-24 DIAGNOSIS — Z86718 Personal history of other venous thrombosis and embolism: Secondary | ICD-10-CM | POA: Diagnosis not present

## 2023-09-24 DIAGNOSIS — Z800 Family history of malignant neoplasm of digestive organs: Secondary | ICD-10-CM | POA: Insufficient documentation

## 2023-09-24 DIAGNOSIS — I82461 Acute embolism and thrombosis of right calf muscular vein: Secondary | ICD-10-CM | POA: Diagnosis not present

## 2023-09-24 LAB — CBC WITH DIFFERENTIAL/PLATELET
Abs Immature Granulocytes: 0.02 K/uL (ref 0.00–0.07)
Basophils Absolute: 0.1 K/uL (ref 0.0–0.1)
Basophils Relative: 1 %
Eosinophils Absolute: 0.3 K/uL (ref 0.0–0.5)
Eosinophils Relative: 5 %
HCT: 45.5 % (ref 36.0–46.0)
Hemoglobin: 14.7 g/dL (ref 12.0–15.0)
Immature Granulocytes: 0 %
Lymphocytes Relative: 25 %
Lymphs Abs: 1.8 K/uL (ref 0.7–4.0)
MCH: 31.1 pg (ref 26.0–34.0)
MCHC: 32.3 g/dL (ref 30.0–36.0)
MCV: 96.4 fL (ref 80.0–100.0)
Monocytes Absolute: 0.7 K/uL (ref 0.1–1.0)
Monocytes Relative: 9 %
Neutro Abs: 4.3 K/uL (ref 1.7–7.7)
Neutrophils Relative %: 60 %
Platelets: 273 K/uL (ref 150–400)
RBC: 4.72 MIL/uL (ref 3.87–5.11)
RDW: 13.3 % (ref 11.5–15.5)
WBC: 7.1 K/uL (ref 4.0–10.5)
nRBC: 0 % (ref 0.0–0.2)

## 2023-09-24 LAB — COMPREHENSIVE METABOLIC PANEL WITH GFR
ALT: 30 U/L (ref 0–44)
AST: 21 U/L (ref 15–41)
Albumin: 3.8 g/dL (ref 3.5–5.0)
Alkaline Phosphatase: 66 U/L (ref 38–126)
Anion gap: 10 (ref 5–15)
BUN: 14 mg/dL (ref 6–20)
CO2: 24 mmol/L (ref 22–32)
Calcium: 9 mg/dL (ref 8.9–10.3)
Chloride: 108 mmol/L (ref 98–111)
Creatinine, Ser: 1.07 mg/dL — ABNORMAL HIGH (ref 0.44–1.00)
GFR, Estimated: >60 mL/min (ref >60)
Glucose, Bld: 111 mg/dL — ABNORMAL HIGH (ref 70–99)
Potassium: 3.8 mmol/L (ref 3.5–5.1)
Sodium: 142 mmol/L (ref 135–145)
Total Bilirubin: 0.5 mg/dL (ref 0.0–1.2)
Total Protein: 6.9 g/dL (ref 6.5–8.1)

## 2023-09-24 LAB — PROTIME-INR
INR: 1.8 — ABNORMAL HIGH (ref 0.8–1.2)
Prothrombin Time: 21.5 s — ABNORMAL HIGH (ref 11.4–15.2)

## 2023-09-24 LAB — D-DIMER, QUANTITATIVE: D-Dimer, Quant: <0.27 ug{FEU}/mL (ref 0.00–0.50)

## 2023-09-25 ENCOUNTER — Other Ambulatory Visit (HOSPITAL_COMMUNITY): Payer: Self-pay

## 2023-09-30 ENCOUNTER — Other Ambulatory Visit: Payer: Self-pay | Admitting: Hematology

## 2023-09-30 ENCOUNTER — Other Ambulatory Visit (HOSPITAL_COMMUNITY): Payer: Self-pay

## 2023-09-30 DIAGNOSIS — H5203 Hypermetropia, bilateral: Secondary | ICD-10-CM | POA: Diagnosis not present

## 2023-10-02 ENCOUNTER — Other Ambulatory Visit: Payer: Self-pay

## 2023-10-02 ENCOUNTER — Inpatient Hospital Stay: Admitting: Oncology

## 2023-10-02 ENCOUNTER — Other Ambulatory Visit (HOSPITAL_COMMUNITY): Payer: Self-pay

## 2023-10-02 ENCOUNTER — Other Ambulatory Visit: Payer: Self-pay | Admitting: *Deleted

## 2023-10-02 DIAGNOSIS — Z7901 Long term (current) use of anticoagulants: Secondary | ICD-10-CM

## 2023-10-02 DIAGNOSIS — I82451 Acute embolism and thrombosis of right peroneal vein: Secondary | ICD-10-CM | POA: Diagnosis not present

## 2023-10-02 MED ORDER — APIXABAN 2.5 MG PO TABS
2.5000 mg | ORAL_TABLET | Freq: Two times a day (BID) | ORAL | 3 refills | Status: AC
  Filled 2023-10-02: qty 120, 60d supply, fill #0
  Filled 2023-11-27: qty 120, 60d supply, fill #1
  Filled 2024-02-02 (×2): qty 180, 90d supply, fill #0

## 2023-10-02 NOTE — Addendum Note (Signed)
 Addended by: GEOFM NEST E on: 10/02/2023 03:43 PM   Modules accepted: Orders

## 2023-10-02 NOTE — Progress Notes (Addendum)
 [facility] OFFICE PROGRESS NOTE  Alphonsa Glendia LABOR, MD  I connected with Ruth Robertson on 10/02/23 at 11:15 AM EDT by telephone visit and verified that I am speaking with the correct person using two identifiers.   I discussed the limitations, risks, security and privacy concerns of performing an evaluation and management service by telemedicine and the availability of in-person appointments. I also discussed with the patient that there may be a patient responsible charge related to this service. The patient expressed understanding and agreed to proceed.   Other persons participating in the visit and their role in the encounter: NP, Patient    Patient's location: Home  Provider's location: Clinic     ASSESSMENT & PLAN:  Assessment & Plan Acute deep vein thrombosis (DVT) of right peroneal vein (HCC) - Hematology workup from 08/13/2022 was essentially unremarkable.  Lupus anticoagulant was negative, anticardiolipin and antibeta 2 glycoprotein antibodies were negative.  Factor V Leiden and prothrombin gene mutation testing were negative. - Long-term anticoagulation was recommended. -She did have vascular appointment on 09/26/2022 where a left venous ultrasound was performed in clinic which showed no evidence of popliteal cyst bilaterally but did show DVT involving right peroneal veins.  Findings appeared improved from previous examination. - D-dimer was undetectable on 09/24/2023. -Patient is interested in coming off of Coumadin  completely and/or switching to Eliquis .  Discussed with Dr. Davonna who recommends either low-dose Eliquis  2.5 mg twice daily or coming off anticoagulation completely x 1 month and repeat D-dimer.  -Patient has opted to stay on low-dose Eliquis  prophylaxis.  We discussed going ahead and starting Eliquis  2.5 mg as her INR is less than 2.  Most recent INR was 1.8.  Discussed taking 1 in the morning 1 at bedtime. -We discussed in detail symptoms of  both DVT and PE and when to be seen in the emergency room.  She understands and will report if needed.  Orders Placed This Encounter  Procedures   CBC with Differential    Standing Status:   Future    Expected Date:   04/03/2024    Expiration Date:   10/01/2024   Protime-INR    Standing Status:   Future    Expected Date:   04/03/2024    Expiration Date:   10/01/2024   Comprehensive metabolic panel    Standing Status:   Future    Expected Date:   04/03/2024    Expiration Date:   10/01/2024   D-dimer, quantitative    Standing Status:   Future    Expected Date:   04/03/2024    Expiration Date:   10/01/2024    INTERVAL HISTORY: Patient returns for follow-up for weakly provoked PE and DVT.   Patient is still taking Coumadin  and followed by the Coumadin  clinic.  Denies any interval hospitalizations, surgeries or changes to her baseline health.  She has been taking Coumadin  as prescribed.  We reviewed ultrasound of right lower extremity which did not reveal evidence of residual or acute DVT.  SUMMARY OF HEMATOLOGIC HISTORY: Oncology History   No history exists.    1.  Weekly provoked/unprovoked pulmonary embolism: - [place] from the beach (4 hours with 1 stop) and noticed right knee pain and evaluated in the ER on 08/03/2022 and 72,024. - Doppler (08/07/2022): Acute DVT involving right peroneal vein, right soleal vein, right gastrocnemius veins. - Eliquis  started on 08/07/2022 with loading dose. - Workup with pleuritic pain in the right shoulder/upper back on 08/12/2022. -  CT angiogram (08/11/2020): Positive for acute PE with CT evidence of right heart strain consistent with at least submassive PE.  Patchy density in the superior segment of the right lower lobe consistent with infiltrate/infarction. - No history of miscarriages. - Lupus anticoagulant, anticardiolipin and antibeta 2 glycoprotein antibodies negative. - It is not entirely clear if she is resistant to Eliquis .  However given  the size of the embolus, recommend switching to anticoagulant to a different class. - Coumadin  started on 08/12/2022.   2.  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 - No family history of lupus anticoagulant.  Maternal aunt had breast cancer.  Another maternal aunt had colon cancer. CBC    Component Value Date/Time   WBC 7.1 09/24/2023 1243   RBC 4.72 09/24/2023 1243   HGB 14.7 09/24/2023 1243   HGB 15.1 04/24/2021 0746   HCT 45.5 09/24/2023 1243   HCT 46.0 04/24/2021 0746   PLT 273 09/24/2023 1243   PLT 294 04/24/2021 0746   MCV 96.4 09/24/2023 1243   MCV 93 04/24/2021 0746   MCH 31.1 09/24/2023 1243   MCHC 32.3 09/24/2023 1243   RDW 13.3 09/24/2023 1243   RDW 12.8 04/24/2021 0746   LYMPHSABS 1.8 09/24/2023 1243   LYMPHSABS 2.0 04/24/2021 0746   MONOABS 0.7 09/24/2023 1243   EOSABS 0.3 09/24/2023 1243   EOSABS 0.3 04/24/2021 0746   BASOSABS 0.1 09/24/2023 1243   BASOSABS 0.1 04/24/2021 0746       Latest Ref Rng & Units 09/24/2023   12:43 PM 07/07/2023    9:46 AM 06/27/2023   12:19 PM  CMP  Glucose 70 - 99 mg/dL 888  99  85   BUN 6 - 20 mg/dL 14   13   Creatinine 9.55 - 1.00 mg/dL 8.92   9.01   Sodium 864 - 145 mmol/L 142   139   Potassium 3.5 - 5.1 mmol/L 3.8   3.6   Chloride 98 - 111 mmol/L 108   108   CO2 22 - 32 mmol/L 24   24   Calcium  8.9 - 10.3 mg/dL 9.0   9.3   Total Protein 6.5 - 8.1 g/dL 6.9   6.9   Total Bilirubin 0.0 - 1.2 mg/dL 0.5   0.3   Alkaline Phos 38 - 126 U/L 66   62   AST 15 - 41 U/L 21   18   ALT 0 - 44 U/L 30   27      Lab Results  Component Value Date   FERRITIN 42 02/28/2021    There were no vitals filed for this visit.  Review of System:  Review of Systems  Constitutional:  Negative for malaise/fatigue.  Respiratory:  Positive for shortness of breath.   Neurological:  Positive for tingling and sensory change.    Physical Exam: Physical Exam Neurological:     Mental Status: She  is alert and oriented to person, place, and time.     I spent 20 minutes dedicated to the care of this patient (face-to-face and non-face-to-face) on the date of the encounter to include what is described in the assessment and plan.,  Delon Hope, NP 10/02/2023 9:40 AM

## 2023-10-02 NOTE — Assessment & Plan Note (Deleted)
-   Hematology workup from 08/13/2022 was essentially unremarkable.  Lupus anticoagulant was negative, anticardiolipin and antibeta 2 glycoprotein antibodies were negative.  Factor V Leiden and prothrombin gene mutation testing were negative. - Long-term anticoagulation was recommended. -She did have vascular appointment on 09/26/2022 where a left venous ultrasound was performed in clinic which showed no evidence of popliteal cyst bilaterally but did show DVT involving right peroneal veins.  Findings appeared improved from previous examination. - D-dimer was undetectable on 09/24/2023. -Continue coumadin  dosing based on INR with Coumadin  clinic in [place].  -Currently taking 5 mg daily.  Most recent INR was 1.8. - Doppler of right lower extremity did not reveal evidence of acute or residual DVT. -She continues to deny any concerns for recurrence such as shortness of breath, chest pain, leg swelling or pain.

## 2023-10-02 NOTE — Assessment & Plan Note (Addendum)
-   Hematology workup from 08/13/2022 was essentially unremarkable.  Lupus anticoagulant was negative, anticardiolipin and antibeta 2 glycoprotein antibodies were negative.  Factor V Leiden and prothrombin gene mutation testing were negative. - Long-term anticoagulation was recommended. -She did have vascular appointment on 09/26/2022 where a left venous ultrasound was performed in clinic which showed no evidence of popliteal cyst bilaterally but did show DVT involving right peroneal veins.  Findings appeared improved from previous examination. - D-dimer was undetectable on 09/24/2023. -Patient is interested in coming off of Coumadin  completely and/or switching to Eliquis .  Discussed with Dr. Davonna who recommends either low-dose Eliquis  2.5 mg twice daily or coming off anticoagulation completely x 1 month and repeat D-dimer.  -Patient has opted to stay on low-dose Eliquis  prophylaxis.  We discussed going ahead and starting Eliquis  2.5 mg as her INR is less than 2.  Most recent INR was 1.8.  Discussed taking 1 in the morning 1 at bedtime. -We discussed in detail symptoms of both DVT and PE and when to be seen in the emergency room.  She understands and will report if needed.

## 2023-10-03 ENCOUNTER — Inpatient Hospital Stay

## 2023-10-03 ENCOUNTER — Other Ambulatory Visit (HOSPITAL_COMMUNITY): Payer: Self-pay

## 2023-10-03 ENCOUNTER — Encounter (HOSPITAL_COMMUNITY): Payer: Self-pay

## 2023-10-03 DIAGNOSIS — Z86711 Personal history of pulmonary embolism: Secondary | ICD-10-CM | POA: Diagnosis not present

## 2023-10-03 DIAGNOSIS — Z7901 Long term (current) use of anticoagulants: Secondary | ICD-10-CM

## 2023-10-03 DIAGNOSIS — Z803 Family history of malignant neoplasm of breast: Secondary | ICD-10-CM | POA: Diagnosis not present

## 2023-10-03 DIAGNOSIS — Z800 Family history of malignant neoplasm of digestive organs: Secondary | ICD-10-CM | POA: Diagnosis not present

## 2023-10-03 DIAGNOSIS — Z86718 Personal history of other venous thrombosis and embolism: Secondary | ICD-10-CM | POA: Diagnosis not present

## 2023-10-03 LAB — PROTIME-INR
INR: 1.6 — ABNORMAL HIGH (ref 0.8–1.2)
Prothrombin Time: 19.7 s — ABNORMAL HIGH (ref 11.4–15.2)

## 2023-10-03 NOTE — Progress Notes (Signed)
 Patient came in to have labs drawn and wanted to know when to start the Eliquis .   Spoke with Delon Hope, NP and she said that if her INR is less than 2 to start tonight but if it is greater than 2 to hold coumadin  and start the Eliquis  tomorrow.   Patient aware and agreeable with plan.

## 2023-10-06 ENCOUNTER — Ambulatory Visit: Payer: Self-pay | Admitting: Oncology

## 2023-10-07 ENCOUNTER — Other Ambulatory Visit (HOSPITAL_COMMUNITY): Payer: Self-pay

## 2023-10-07 ENCOUNTER — Other Ambulatory Visit: Payer: Self-pay | Admitting: *Deleted

## 2023-10-10 ENCOUNTER — Encounter: Payer: Self-pay | Admitting: Family Medicine

## 2023-10-10 ENCOUNTER — Ambulatory Visit: Admitting: Family Medicine

## 2023-10-10 VITALS — BP 112/78 | HR 66 | Temp 97.9°F | Ht 65.5 in | Wt 187.0 lb

## 2023-10-10 DIAGNOSIS — F17200 Nicotine dependence, unspecified, uncomplicated: Secondary | ICD-10-CM | POA: Diagnosis not present

## 2023-10-10 DIAGNOSIS — Z23 Encounter for immunization: Secondary | ICD-10-CM

## 2023-10-10 DIAGNOSIS — Z86718 Personal history of other venous thrombosis and embolism: Secondary | ICD-10-CM | POA: Diagnosis not present

## 2023-10-10 DIAGNOSIS — E785 Hyperlipidemia, unspecified: Secondary | ICD-10-CM | POA: Diagnosis not present

## 2023-10-10 DIAGNOSIS — E66811 Obesity, class 1: Secondary | ICD-10-CM | POA: Diagnosis not present

## 2023-10-10 DIAGNOSIS — R739 Hyperglycemia, unspecified: Secondary | ICD-10-CM | POA: Diagnosis not present

## 2023-10-10 NOTE — Progress Notes (Signed)
   Subjective:    Patient ID: Ruth Robertson, female    DOB: October 26, 1965, 58 y.o.   MRN: [id number]  HPI 3 month follow up, not able to refill phentermine  due to PA needed  Hyperlipidemia, unspecified hyperlipidemia type - Plan: Lipid Panel  History of blood clots - Plan: CT CHEST LUNG CA SCREEN LOW DOSE W/O CM  Smoker - Plan: CT CHEST LUNG CA SCREEN LOW DOSE W/O CM  Hyperglycemia - Plan: Hemoglobin A1c  Immunization due - Plan: Pneumococcal conjugate vaccine 20-valent (Prevnar 20)  Obesity (BMI 30.0-34.9)  This patient had a severe pulmonary embolism back in the summer 2024.  She has been on Eliquis  then back to Coumadin  now back to Eliquis .  She is wondering if she should stop her anticoagulant or continue it she saw oncology they sort of gave her choices in either direction but leaning toward her continuing it She is a smoker we did discuss lung cancer screening She also has a history of hyperglycemia tries to watch her diet suffers with obesity tries to be healthy with eating choices but it is difficult she tries to fit in some exercise but she works a lot of hours she denies being depressed or anxious.  She does have a history of hyperlipidemia  Review of Systems     Objective:   Physical Exam  General-in no acute distress Eyes-no discharge Lungs-respiratory rate normal, CTA CV-no murmurs,RRR Extremities skin warm dry no edema Neuro grossly normal Behavior normal, alert       Assessment & Plan:  1. Hyperlipidemia, unspecified hyperlipidemia type (Primary) Check lipid profile.  Try to keep LDL under good control hold off on statins for now wait to see what lipid panel shows - Lipid Panel  2. History of blood clots I would recommend that she continue her Eliquis . Additional research showed the following  Patients with unprovoked venous thromboembolism (VTE) have a higher risk of recurrence compared to those with provoked VTE.  The recurrence risk is about  10% at 1 year and 30% at 5 years after discontinuing anticoagulation therapy.[1-3]  This risk is independent of the presence of underlying thrombophilic defects.[2]  Extended anticoagulation therapy can reduce the risk of recurrence but does not eliminate it entirely.[4]  The risk of recurrence is higher in men compared to women.[5] This was shared with the patient -3. Smoker She was counseled to quit smoking unfortunately it is difficult for her to do so if she decides to add a medicine such as Chantix she will let us  know lung cancer screening test would be a good idea for her we tried to discuss risk and benefits she is on board for now - CT CHEST LUNG CA SCREEN LOW DOSE W/O CM  4. Hyperglycemia Check A1c healthy diet - Hemoglobin A1c  5. Immunization due Today - Pneumococcal conjugate vaccine 20-valent (Prevnar 20)  6. Obesity (BMI 30.0-34.9) Difficult for patient lose weight she has tried portion control regular physical activity She would benefit from phenteramine topiramate  ER but so far it is difficult to get this approved We will have the referral team look into this.

## 2023-10-13 ENCOUNTER — Other Ambulatory Visit: Payer: Self-pay

## 2023-10-13 MED ORDER — PHENTERMINE-TOPIRAMATE ER 3.75-23 MG PO CP24: 1.0000 | ORAL_CAPSULE | Freq: Every morning | ORAL | 2 refills | Status: AC

## 2023-10-13 NOTE — Progress Notes (Signed)
 Refill sent and will see what happens with insurance.

## 2023-10-14 ENCOUNTER — Telehealth: Payer: Self-pay | Admitting: Pharmacy Technician

## 2023-10-14 ENCOUNTER — Other Ambulatory Visit: Payer: Self-pay

## 2023-10-14 ENCOUNTER — Other Ambulatory Visit (HOSPITAL_COMMUNITY): Payer: Self-pay

## 2023-10-14 DIAGNOSIS — Z86718 Personal history of other venous thrombosis and embolism: Secondary | ICD-10-CM

## 2023-10-14 DIAGNOSIS — F17200 Nicotine dependence, unspecified, uncomplicated: Secondary | ICD-10-CM

## 2023-10-14 NOTE — Telephone Encounter (Signed)
 Pharmacy Patient Advocate Encounter   Received notification from CoverMyMeds that prior authorization for Phentermine -Topiramate  ER 3.75-23MG  er capsules is required/requested.   Insurance verification completed.   The patient is insured through [facility] .   Per test claim: PA required; PA submitted to above mentioned insurance via Latent Key/confirmation #/EOC [id number] Status is pending

## 2023-10-17 ENCOUNTER — Other Ambulatory Visit (HOSPITAL_COMMUNITY): Payer: Self-pay

## 2023-10-17 NOTE — Telephone Encounter (Signed)
 Pharmacy Patient Advocate Encounter  Received notification from [facility] that Prior Authorization for Phentermine -Topiramate  ER 3.75-23MG  er capsules has been APPROVED from 10/16/2023 to 10/30/2023. Ran test claim, Copay is $5.00. This test claim was processed through [facility]- copay amounts may vary at other pharmacies due to pharmacy/plan contracts, or as the patient moves through the different stages of their insurance plan.   PA #/Case ID/Reference #: [id number]

## 2023-10-21 ENCOUNTER — Telehealth: Payer: Self-pay | Admitting: Acute Care

## 2023-10-21 NOTE — Telephone Encounter (Signed)
 Lung Cancer Screening Narrative/Criteria Questionnaire (Cigarette Smokers Only- No Cigars/Pipes/vapes)   Ruth Robertson   SDMV:Patient calling insurance with CPT code        12/19/1965   LDCT: Patient calling insurance with CPT code, will call back to schedule    58 y.o.   Phone: [phone]  Lung Screening Narrative (confirm age 80-77 yrs Medicare / 50-80 yrs Private pay insurance)   Insurance information:[employer]    Referring Provider:Dr. Alphonsa   This screening involves an initial phone call with a team member from our program. It is called a shared decision making visit. The initial meeting is required by  insurance and Medicare to make sure you understand the program. This appointment takes about 15-20 minutes to complete. You will complete the screening scan at your scheduled date/time.  This scan takes about 5-10 minutes to complete. You can eat and drink normally before and after the scan.  Criteria questions for Lung Cancer Screening:   Are you a current or former smoker? Current Age began smoking: 58yo   If you are a former smoker, what year did you quit smoking? N/A(within 15 yrs)   To calculate your smoking history, I need an accurate estimate of how many packs of cigarettes you smoked per day and for how many years. (Not just the number of PPD you are now smoking)   Years smoking 39 x Packs per day 3/4 = Pack years 29.25   (at least 20 pack yrs)   (Make sure they understand that we need to know how much they have smoked in the past, not just the number of PPD they are smoking now)  Do you have a personal history of cancer?  No    Do you have a family history of cancer? No  Are you coughing up blood?  No  Have you had unexplained weight loss of 15 lbs or more in the last 6 months? No  It looks like you meet all criteria.  When would be a good time for us  to schedule you for this screening?   Additional information: N/A

## 2023-10-24 ENCOUNTER — Other Ambulatory Visit: Payer: Self-pay

## 2023-10-24 DIAGNOSIS — F17210 Nicotine dependence, cigarettes, uncomplicated: Secondary | ICD-10-CM

## 2023-10-24 DIAGNOSIS — Z122 Encounter for screening for malignant neoplasm of respiratory organs: Secondary | ICD-10-CM

## 2023-10-24 DIAGNOSIS — Z87891 Personal history of nicotine dependence: Secondary | ICD-10-CM

## 2023-10-30 ENCOUNTER — Inpatient Hospital Stay: Attending: Oncology

## 2023-10-30 DIAGNOSIS — Z86711 Personal history of pulmonary embolism: Secondary | ICD-10-CM | POA: Insufficient documentation

## 2023-10-30 DIAGNOSIS — Z7901 Long term (current) use of anticoagulants: Secondary | ICD-10-CM | POA: Insufficient documentation

## 2023-10-30 DIAGNOSIS — Z86718 Personal history of other venous thrombosis and embolism: Secondary | ICD-10-CM | POA: Insufficient documentation

## 2023-10-30 DIAGNOSIS — I82451 Acute embolism and thrombosis of right peroneal vein: Secondary | ICD-10-CM

## 2023-10-30 LAB — D-DIMER, QUANTITATIVE: D-Dimer, Quant: 0.3 ug{FEU}/mL (ref 0.00–0.50)

## 2023-10-31 ENCOUNTER — Ambulatory Visit: Admitting: *Deleted

## 2023-10-31 ENCOUNTER — Encounter: Payer: Self-pay | Admitting: *Deleted

## 2023-10-31 DIAGNOSIS — F17210 Nicotine dependence, cigarettes, uncomplicated: Secondary | ICD-10-CM | POA: Diagnosis not present

## 2023-10-31 NOTE — Progress Notes (Signed)
  Virtual Visit via Telephone Note  I connected with Ruth Robertson on 10/31/23 at  9:00 AM EDT by telephone and verified that I am speaking with the correct person using two identifiers.  Location: Patient: at home Provider: [address], [place], [place], [address]    I discussed the limitations, risks, security and privacy concerns of performing an evaluation and management service by telephone and the availability of in person appointments. I also discussed with the patient that there may be a patient responsible charge related to this service. The patient expressed understanding and agreed to proceed.   Shared Decision Making Visit Lung Cancer Screening Program [id number])   Eligibility: Age 58 y.o. Pack Years Smoking History Calculation 29 (# packs/per year x # years smoked) Recent History of coughing up blood  no Unexplained weight loss? no ( >Than 15 pounds within the last 6 months ) Prior History Lung / other cancer no (Diagnosis within the last 5 years already requiring surveillance chest CT Scans). Smoking Status Current Smoker Former Smokers: Years since quit: n/a  Quit Date: n/a  Visit Components: Discussion included one or more decision making aids. yes Discussion included risk/benefits of screening. yes Discussion included potential follow up diagnostic testing for abnormal scans. yes Discussion included meaning and risk of over diagnosis. yes Discussion included meaning and risk of False Positives. yes Discussion included meaning of total radiation exposure. yes  Counseling Included: Importance of adherence to annual lung cancer LDCT screening. yes Impact of comorbidities on ability to participate in the program. yes Ability and willingness to under diagnostic treatment. yes  Smoking Cessation Counseling: Current Smokers:  Discussed importance of smoking cessation. yes Information about tobacco cessation classes and interventions provided to  patient. yes Patient provided with ticket for LDCT Scan. no Symptomatic Patient. no  Counseling(Intermediate counseling: > three minutes) [id number] Diagnosis Code: Tobacco Use Z72.0 Asymptomatic Patient yes  Counseled patient 4 minutes regarding tobacco use.   Former Smokers:  Discussed the importance of maintaining cigarette abstinence. yes Diagnosis Code: Personal History of Nicotine  Dependence. S12.108 Information about tobacco cessation classes and interventions provided to patient. Yes Patient provided with ticket for LDCT Scan. no Written Order for Lung Cancer Screening with LDCT placed in Epic. Yes (CT Chest Lung Cancer Screening Low Dose W/O CM) [id number] Z12.2-Screening of respiratory organs Z87.891-Personal history of nicotine  dependence   Laneta Speaks, RN

## 2023-10-31 NOTE — Patient Instructions (Signed)

## 2023-11-04 ENCOUNTER — Encounter

## 2023-11-04 DIAGNOSIS — M47819 Spondylosis without myelopathy or radiculopathy, site unspecified: Secondary | ICD-10-CM | POA: Diagnosis not present

## 2023-11-04 DIAGNOSIS — Z79899 Other long term (current) drug therapy: Secondary | ICD-10-CM | POA: Diagnosis not present

## 2023-11-06 ENCOUNTER — Inpatient Hospital Stay: Attending: Oncology | Admitting: Oncology

## 2023-11-06 DIAGNOSIS — I82451 Acute embolism and thrombosis of right peroneal vein: Secondary | ICD-10-CM | POA: Diagnosis not present

## 2023-11-06 NOTE — Assessment & Plan Note (Addendum)
-   Hematology workup from 08/13/2022 was essentially unremarkable.  Lupus anticoagulant was negative, anticardiolipin and antibeta 2 glycoprotein antibodies were negative.  Factor V Leiden and prothrombin gene mutation testing were negative. - Long-term anticoagulation was recommended. -She did have vascular appointment on 09/26/2022 where a left venous ultrasound was performed in clinic which showed no evidence of popliteal cyst bilaterally but did show DVT involving right peroneal veins.  Findings appeared improved from previous examination. - D-dimer was undetectable on 09/24/2023. -Repeat D-dimer from 10/30/2023 was less than 0.30.  -Patient is interested in coming off of Coumadin  completely and/or switching to Eliquis .  Discussed with Dr. Davonna who recommends either low-dose Eliquis  2.5 mg twice daily or coming off anticoagulation completely x 1 month and repeat D-dimer.  -Patient has been feeling well on Eliquis  prophylactic dosing 2.5 mg twice daily.  No signs or symptoms of a blood clot.  No active bleeding.

## 2023-11-06 NOTE — Progress Notes (Signed)
 [facility] OFFICE PROGRESS NOTE  Alphonsa Glendia LABOR, MD  ASSESSMENT & PLAN:    Assessment & Plan Acute deep vein thrombosis (DVT) of right peroneal vein [facility]) - Hematology workup from 08/13/2022 was essentially unremarkable.  Lupus anticoagulant was negative, anticardiolipin and antibeta 2 glycoprotein antibodies were negative.  Factor V Leiden and prothrombin gene mutation testing were negative. - Long-term anticoagulation was recommended. -She did have vascular appointment on 09/26/2022 where a left venous ultrasound was performed in clinic which showed no evidence of popliteal cyst bilaterally but did show DVT involving right peroneal veins.  Findings appeared improved from previous examination. - D-dimer was undetectable on 09/24/2023. -Repeat D-dimer from 10/30/2023 was less than 0.30.  -Patient is interested in coming off of Coumadin  completely and/or switching to Eliquis .  Discussed with Dr. Davonna who recommends either low-dose Eliquis  2.5 mg twice daily or coming off anticoagulation completely x 1 month and repeat D-dimer.  -Patient has been feeling well on Eliquis  prophylactic dosing 2.5 mg twice daily.  No signs or symptoms of a blood clot.  No active bleeding.  Orders Placed This Encounter  Procedures   D-dimer, quantitative    Standing Status:   Future    Expected Date:   05/06/2024    Expiration Date:   08/04/2024   CBC with Differential    Standing Status:   Future    Expected Date:   05/06/2024    Expiration Date:   08/04/2024    INTERVAL HISTORY: Patient returns for follow-up for acute DVT.  Reports patient successfully switched from Coumadin  to Eliquis  2.5 mg twice daily.  She reports she has forgotten to take her morning dose on several occasions due to Coumadin  only being once per day.  Denies any shortness of breath, leg pain or swelling.  She is here to review her most recent D-dimer.  SUMMARY OF HEMATOLOGIC HISTORY: Oncology History Overview Note  1.   Weekly provoked/unprovoked pulmonary embolism: - [place] from the beach (4 hours with 1 stop) and noticed right knee pain and evaluated in the ER on 08/03/2022 and 72,024. - Doppler (08/07/2022): Acute DVT involving right peroneal vein, right soleal vein, right gastrocnemius veins. - Eliquis  started on 08/07/2022 with loading dose. - Workup with pleuritic pain in the right shoulder/upper back on 08/12/2022. - CT angiogram (08/11/2020): Positive for acute PE with CT evidence of right heart strain consistent with at least submassive PE.  Patchy density in the superior segment of the right lower lobe consistent with infiltrate/infarction. - No history of miscarriages. - Lupus anticoagulant, anticardiolipin and antibeta 2 glycoprotein antibodies negative. - It is not entirely clear if she is resistant to Eliquis .  However given the size of the embolus, recommend switching to anticoagulant to a different class. - Coumadin  started on 08/12/2022.   2.  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 - No family history of lupus anticoagulant.  Maternal aunt had breast cancer.  Another maternal aunt had colon cancer.   Acute deep vein thrombosis (DVT) of right peroneal vein (HCC)  08/07/2022 Initial Diagnosis   Acute deep vein thrombosis (DVT) of right peroneal vein (HCC)      CBC    Component Value Date/Time   WBC 7.1 09/24/2023 1243   RBC 4.72 09/24/2023 1243   HGB 14.7 09/24/2023 1243   HGB 15.1 04/24/2021 0746   HCT 45.5 09/24/2023 1243   HCT 46.0 04/24/2021 0746   PLT 273  09/24/2023 1243   PLT 294 04/24/2021 0746   MCV 96.4 09/24/2023 1243   MCV 93 04/24/2021 0746   MCH 31.1 09/24/2023 1243   MCHC 32.3 09/24/2023 1243   RDW 13.3 09/24/2023 1243   RDW 12.8 04/24/2021 0746   LYMPHSABS 1.8 09/24/2023 1243   LYMPHSABS 2.0 04/24/2021 0746   MONOABS 0.7 09/24/2023 1243   EOSABS 0.3 09/24/2023 1243   EOSABS 0.3 04/24/2021 0746   BASOSABS 0.1  09/24/2023 1243   BASOSABS 0.1 04/24/2021 0746       Latest Ref Rng & Units 09/24/2023   12:43 PM 07/07/2023    9:46 AM 06/27/2023   12:19 PM  CMP  Glucose 70 - 99 mg/dL 888  99  85   BUN 6 - 20 mg/dL 14   13   Creatinine 9.55 - 1.00 mg/dL 8.92   9.01   Sodium 864 - 145 mmol/L 142   139   Potassium 3.5 - 5.1 mmol/L 3.8   3.6   Chloride 98 - 111 mmol/L 108   108   CO2 22 - 32 mmol/L 24   24   Calcium  8.9 - 10.3 mg/dL 9.0   9.3   Total Protein 6.5 - 8.1 g/dL 6.9   6.9   Total Bilirubin 0.0 - 1.2 mg/dL 0.5   0.3   Alkaline Phos 38 - 126 U/L 66   62   AST 15 - 41 U/L 21   18   ALT 0 - 44 U/L 30   27      Lab Results  Component Value Date   FERRITIN 42 02/28/2021    There were no vitals filed for this visit.  Review of System:  Review of Systems  Constitutional:  Positive for malaise/fatigue.  HENT:  Positive for sore throat.   Gastrointestinal:  Negative for blood in stool and melena.  Genitourinary:  Negative for hematuria.  Psychiatric/Behavioral:  The patient has insomnia.     Physical Exam: Physical Exam Neurological:     Mental Status: She is alert and oriented to person, place, and time.      I provided 18 minutes of non face-to-face telephone visit time during this encounter, and > 50% was spent counseling as documented under my assessment & plan.   Delon Hope, NP 11/06/2023 2:31 PM

## 2023-11-13 ENCOUNTER — Ambulatory Visit (HOSPITAL_COMMUNITY)
Admission: RE | Admit: 2023-11-13 | Discharge: 2023-11-13 | Disposition: A | Discharge status: Home / Self Care | Source: Ambulatory Visit | Attending: Family Medicine | Admitting: Family Medicine

## 2023-11-13 DIAGNOSIS — Z122 Encounter for screening for malignant neoplasm of respiratory organs: Secondary | ICD-10-CM | POA: Diagnosis not present

## 2023-11-13 DIAGNOSIS — Z87891 Personal history of nicotine dependence: Secondary | ICD-10-CM | POA: Diagnosis not present

## 2023-11-13 DIAGNOSIS — F17210 Nicotine dependence, cigarettes, uncomplicated: Secondary | ICD-10-CM | POA: Diagnosis not present

## 2023-11-17 ENCOUNTER — Other Ambulatory Visit: Payer: Self-pay | Admitting: Acute Care

## 2023-11-17 ENCOUNTER — Encounter: Payer: Self-pay | Admitting: Family Medicine

## 2023-11-17 DIAGNOSIS — F17210 Nicotine dependence, cigarettes, uncomplicated: Secondary | ICD-10-CM

## 2023-11-17 DIAGNOSIS — Z87891 Personal history of nicotine dependence: Secondary | ICD-10-CM

## 2023-11-17 DIAGNOSIS — Z122 Encounter for screening for malignant neoplasm of respiratory organs: Secondary | ICD-10-CM

## 2023-11-21 DIAGNOSIS — R739 Hyperglycemia, unspecified: Secondary | ICD-10-CM | POA: Diagnosis not present

## 2023-11-22 ENCOUNTER — Ambulatory Visit: Payer: Self-pay | Admitting: Family Medicine

## 2023-11-22 LAB — HEMOGLOBIN A1C
Est. average glucose Bld gHb Est-mCnc: 117 mg/dL
Hgb A1c MFr Bld: 5.7 % — ABNORMAL HIGH (ref 4.8–5.6)

## 2023-11-22 LAB — LIPID PANEL
Chol/HDL Ratio: 4.2 ratio (ref 0.0–4.4)
Cholesterol, Total: 163 mg/dL (ref 100–199)
HDL: 39 mg/dL — ABNORMAL LOW (ref >39)
LDL Chol Calc (NIH): 96 mg/dL (ref 0–99)
Triglycerides: 161 mg/dL — ABNORMAL HIGH (ref 0–149)
VLDL Cholesterol Cal: 28 mg/dL (ref 5–40)

## 2023-11-24 ENCOUNTER — Other Ambulatory Visit (HOSPITAL_COMMUNITY): Payer: Self-pay

## 2023-11-27 ENCOUNTER — Other Ambulatory Visit (HOSPITAL_COMMUNITY): Payer: Self-pay

## 2023-12-05 ENCOUNTER — Other Ambulatory Visit (HOSPITAL_COMMUNITY): Payer: Self-pay

## 2023-12-31 ENCOUNTER — Other Ambulatory Visit (HOSPITAL_COMMUNITY): Payer: Self-pay

## 2023-12-31 ENCOUNTER — Other Ambulatory Visit: Payer: Self-pay

## 2023-12-31 MED ORDER — SULFASALAZINE 500 MG PO TBEC
1000.0000 mg | DELAYED_RELEASE_TABLET | Freq: Two times a day (BID) | ORAL | 0 refills | Status: AC
  Filled 2023-12-31: qty 360, 90d supply, fill #0

## 2024-01-02 ENCOUNTER — Other Ambulatory Visit (HOSPITAL_COMMUNITY): Payer: Self-pay

## 2024-01-02 ENCOUNTER — Other Ambulatory Visit: Payer: Self-pay

## 2024-01-05 ENCOUNTER — Other Ambulatory Visit: Payer: Self-pay

## 2024-01-05 ENCOUNTER — Other Ambulatory Visit (HOSPITAL_COMMUNITY): Payer: Self-pay

## 2024-01-06 ENCOUNTER — Other Ambulatory Visit (HOSPITAL_COMMUNITY): Payer: Self-pay

## 2024-01-15 ENCOUNTER — Other Ambulatory Visit (HOSPITAL_COMMUNITY): Payer: Self-pay

## 2024-02-02 ENCOUNTER — Other Ambulatory Visit (HOSPITAL_COMMUNITY): Payer: Self-pay

## 2024-02-02 ENCOUNTER — Other Ambulatory Visit: Payer: Self-pay

## 2024-02-17 ENCOUNTER — Other Ambulatory Visit (HOSPITAL_COMMUNITY): Payer: Self-pay

## 2024-02-17 ENCOUNTER — Other Ambulatory Visit: Payer: Self-pay

## 2024-02-17 MED ORDER — SULFASALAZINE 500 MG PO TBEC
1000.0000 mg | DELAYED_RELEASE_TABLET | Freq: Two times a day (BID) | ORAL | 0 refills | Status: AC
  Filled 2024-02-17: qty 360, 90d supply, fill #0

## 2024-03-25 ENCOUNTER — Ambulatory Visit: Admitting: Obstetrics and Gynecology

## 2024-04-13 ENCOUNTER — Ambulatory Visit: Admitting: Physician Assistant

## 2024-04-23 ENCOUNTER — Ambulatory Visit: Admitting: Family Medicine

## 2024-05-17 ENCOUNTER — Other Ambulatory Visit

## 2024-05-20 ENCOUNTER — Inpatient Hospital Stay: Admitting: Oncology

## 2024-05-21 ENCOUNTER — Inpatient Hospital Stay: Admitting: Oncology

## 2024-05-21 ENCOUNTER — Ambulatory Visit: Admitting: Oncology
